# Patient Record
Sex: Female | Born: 1945 | Race: White | Hispanic: No | Marital: Married | Smoking: Never smoker
Health system: Southern US, Community
[De-identification: ages and names within clinical notes are randomized; demographics above are authoritative.]

## PROBLEM LIST (undated history)

## (undated) DIAGNOSIS — G4730 Sleep apnea, unspecified: Secondary | ICD-10-CM

## (undated) DIAGNOSIS — I10 Essential (primary) hypertension: Secondary | ICD-10-CM

## (undated) DIAGNOSIS — K219 Gastro-esophageal reflux disease without esophagitis: Secondary | ICD-10-CM

## (undated) DIAGNOSIS — E119 Type 2 diabetes mellitus without complications: Secondary | ICD-10-CM

## (undated) DIAGNOSIS — F329 Major depressive disorder, single episode, unspecified: Secondary | ICD-10-CM

## (undated) DIAGNOSIS — F411 Generalized anxiety disorder: Secondary | ICD-10-CM

## (undated) DIAGNOSIS — L52 Erythema nodosum: Secondary | ICD-10-CM

## (undated) DIAGNOSIS — M797 Fibromyalgia: Secondary | ICD-10-CM

## (undated) DIAGNOSIS — M8580 Other specified disorders of bone density and structure, unspecified site: Secondary | ICD-10-CM

## (undated) DIAGNOSIS — K5730 Diverticulosis of large intestine without perforation or abscess without bleeding: Secondary | ICD-10-CM

## (undated) DIAGNOSIS — F419 Anxiety disorder, unspecified: Secondary | ICD-10-CM

## (undated) DIAGNOSIS — G709 Myoneural disorder, unspecified: Secondary | ICD-10-CM

## (undated) DIAGNOSIS — K529 Noninfective gastroenteritis and colitis, unspecified: Secondary | ICD-10-CM

## (undated) DIAGNOSIS — F39 Unspecified mood [affective] disorder: Secondary | ICD-10-CM

## (undated) DIAGNOSIS — H269 Unspecified cataract: Secondary | ICD-10-CM

## (undated) DIAGNOSIS — M1990 Unspecified osteoarthritis, unspecified site: Secondary | ICD-10-CM

## (undated) DIAGNOSIS — T7840XA Allergy, unspecified, initial encounter: Secondary | ICD-10-CM

## (undated) DIAGNOSIS — K589 Irritable bowel syndrome without diarrhea: Secondary | ICD-10-CM

## (undated) DIAGNOSIS — Z5189 Encounter for other specified aftercare: Secondary | ICD-10-CM

## (undated) HISTORY — DX: Unspecified mood (affective) disorder: F39

## (undated) HISTORY — PX: OTHER SURGICAL HISTORY: SHX169

## (undated) HISTORY — DX: Diverticulosis of large intestine without perforation or abscess without bleeding: K57.30

## (undated) HISTORY — DX: Unspecified osteoarthritis, unspecified site: M19.90

## (undated) HISTORY — PX: CATARACT EXTRACTION W/ INTRAOCULAR LENS  IMPLANT, BILATERAL: SHX1307

## (undated) HISTORY — DX: Unspecified cataract: H26.9

## (undated) HISTORY — DX: Erythema nodosum: L52

## (undated) HISTORY — PX: DENTAL SURGERY: SHX609

## (undated) HISTORY — DX: Fibromyalgia: M79.7

## (undated) HISTORY — PX: ESOPHAGOGASTRODUODENOSCOPY: SHX1529

## (undated) HISTORY — DX: Irritable bowel syndrome without diarrhea: K58.9

## (undated) HISTORY — DX: Allergy, unspecified, initial encounter: T78.40XA

## (undated) HISTORY — DX: Sleep apnea, unspecified: G47.30

## (undated) HISTORY — DX: Noninfective gastroenteritis and colitis, unspecified: K52.9

## (undated) HISTORY — DX: Other specified disorders of bone density and structure, unspecified site: M85.80

## (undated) HISTORY — DX: Encounter for other specified aftercare: Z51.89

## (undated) HISTORY — DX: Essential (primary) hypertension: I10

## (undated) HISTORY — DX: Gastro-esophageal reflux disease without esophagitis: K21.9

## (undated) HISTORY — PX: UPPER GASTROINTESTINAL ENDOSCOPY: SHX188

## (undated) HISTORY — DX: Type 2 diabetes mellitus without complications: E11.9

## (undated) HISTORY — DX: Anxiety disorder, unspecified: F41.9

## (undated) HISTORY — PX: NISSEN FUNDOPLICATION: SHX2091

## (undated) HISTORY — PX: CHOLECYSTECTOMY: SHX55

## (undated) HISTORY — DX: Major depressive disorder, single episode, unspecified: F32.9

## (undated) HISTORY — DX: Myoneural disorder, unspecified: G70.9

## (undated) HISTORY — PX: ROTATOR CUFF REPAIR: SHX139

## (undated) HISTORY — PX: ABDOMINAL HYSTERECTOMY: SHX81

## (undated) HISTORY — PX: COLONOSCOPY: SHX174

---
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
## 1997-04-16 ENCOUNTER — Inpatient Hospital Stay (HOSPITAL_COMMUNITY): Admission: RE | Admit: 1997-04-16 | Discharge: 1997-04-17 | Payer: Self-pay | Admitting: Surgery

## 1997-11-03 ENCOUNTER — Encounter: Payer: Self-pay | Admitting: Obstetrics and Gynecology

## 1997-11-03 ENCOUNTER — Ambulatory Visit (HOSPITAL_COMMUNITY): Admission: RE | Admit: 1997-11-03 | Discharge: 1997-11-03 | Payer: Self-pay | Admitting: Obstetrics and Gynecology

## 1997-11-06 ENCOUNTER — Other Ambulatory Visit: Admission: RE | Admit: 1997-11-06 | Discharge: 1997-11-06 | Payer: Self-pay | Admitting: Obstetrics and Gynecology

## 1998-11-08 ENCOUNTER — Ambulatory Visit (HOSPITAL_COMMUNITY): Admission: RE | Admit: 1998-11-08 | Discharge: 1998-11-08 | Payer: Self-pay | Admitting: Obstetrics and Gynecology

## 1998-11-08 ENCOUNTER — Encounter: Payer: Self-pay | Admitting: Obstetrics and Gynecology

## 1998-12-08 ENCOUNTER — Encounter: Payer: Self-pay | Admitting: Obstetrics and Gynecology

## 1998-12-08 ENCOUNTER — Encounter: Admission: RE | Admit: 1998-12-08 | Discharge: 1998-12-08 | Payer: Self-pay | Admitting: Obstetrics and Gynecology

## 1999-11-09 ENCOUNTER — Encounter: Payer: Self-pay | Admitting: Obstetrics and Gynecology

## 1999-11-09 ENCOUNTER — Encounter: Admission: RE | Admit: 1999-11-09 | Discharge: 1999-11-09 | Payer: Self-pay | Admitting: Obstetrics and Gynecology

## 2000-11-12 ENCOUNTER — Encounter: Payer: Self-pay | Admitting: Obstetrics and Gynecology

## 2000-11-12 ENCOUNTER — Encounter: Admission: RE | Admit: 2000-11-12 | Discharge: 2000-11-12 | Payer: Self-pay | Admitting: Obstetrics and Gynecology

## 2001-03-25 ENCOUNTER — Other Ambulatory Visit: Admission: RE | Admit: 2001-03-25 | Discharge: 2001-03-25 | Payer: Self-pay | Admitting: Obstetrics and Gynecology

## 2001-03-28 ENCOUNTER — Encounter: Payer: Self-pay | Admitting: Obstetrics and Gynecology

## 2001-03-28 ENCOUNTER — Encounter: Admission: RE | Admit: 2001-03-28 | Discharge: 2001-03-28 | Payer: Self-pay | Admitting: Obstetrics and Gynecology

## 2001-07-09 ENCOUNTER — Encounter (INDEPENDENT_AMBULATORY_CARE_PROVIDER_SITE_OTHER): Payer: Self-pay | Admitting: *Deleted

## 2001-07-09 ENCOUNTER — Ambulatory Visit (HOSPITAL_COMMUNITY): Admission: RE | Admit: 2001-07-09 | Discharge: 2001-07-09 | Payer: Self-pay | Admitting: Internal Medicine

## 2001-07-09 ENCOUNTER — Encounter: Payer: Self-pay | Admitting: Internal Medicine

## 2001-08-12 ENCOUNTER — Ambulatory Visit (HOSPITAL_COMMUNITY): Admission: RE | Admit: 2001-08-12 | Discharge: 2001-08-12 | Payer: Self-pay | Admitting: Internal Medicine

## 2001-08-29 ENCOUNTER — Encounter: Payer: Self-pay | Admitting: Internal Medicine

## 2001-09-04 ENCOUNTER — Encounter: Payer: Self-pay | Admitting: Internal Medicine

## 2001-09-04 ENCOUNTER — Ambulatory Visit (HOSPITAL_COMMUNITY): Admission: RE | Admit: 2001-09-04 | Discharge: 2001-09-04 | Payer: Self-pay | Admitting: Internal Medicine

## 2001-11-13 ENCOUNTER — Encounter: Admission: RE | Admit: 2001-11-13 | Discharge: 2001-11-13 | Payer: Self-pay | Admitting: Obstetrics and Gynecology

## 2001-11-13 ENCOUNTER — Encounter: Payer: Self-pay | Admitting: Obstetrics and Gynecology

## 2001-11-22 ENCOUNTER — Encounter: Payer: Self-pay | Admitting: Surgery

## 2001-11-27 ENCOUNTER — Encounter (INDEPENDENT_AMBULATORY_CARE_PROVIDER_SITE_OTHER): Payer: Self-pay | Admitting: *Deleted

## 2001-11-28 ENCOUNTER — Inpatient Hospital Stay (HOSPITAL_COMMUNITY): Admission: RE | Admit: 2001-11-28 | Discharge: 2001-11-29 | Payer: Self-pay | Admitting: Surgery

## 2002-12-22 ENCOUNTER — Encounter: Payer: Self-pay | Admitting: Internal Medicine

## 2003-01-27 ENCOUNTER — Encounter: Admission: RE | Admit: 2003-01-27 | Discharge: 2003-01-27 | Payer: Self-pay | Admitting: Obstetrics and Gynecology

## 2003-12-25 ENCOUNTER — Ambulatory Visit: Payer: Self-pay | Admitting: Internal Medicine

## 2004-02-18 ENCOUNTER — Encounter: Admission: RE | Admit: 2004-02-18 | Discharge: 2004-02-18 | Payer: Self-pay | Admitting: Obstetrics and Gynecology

## 2004-02-23 ENCOUNTER — Ambulatory Visit: Payer: Self-pay | Admitting: Internal Medicine

## 2004-03-22 ENCOUNTER — Ambulatory Visit: Payer: Self-pay | Admitting: Internal Medicine

## 2004-04-25 ENCOUNTER — Ambulatory Visit: Payer: Self-pay | Admitting: Internal Medicine

## 2004-05-30 ENCOUNTER — Ambulatory Visit: Payer: Self-pay | Admitting: Internal Medicine

## 2004-10-17 ENCOUNTER — Encounter: Admission: RE | Admit: 2004-10-17 | Discharge: 2004-10-17 | Payer: Self-pay | Admitting: Internal Medicine

## 2004-10-17 ENCOUNTER — Ambulatory Visit: Payer: Self-pay | Admitting: Internal Medicine

## 2004-11-19 ENCOUNTER — Ambulatory Visit: Payer: Self-pay | Admitting: Internal Medicine

## 2004-12-02 ENCOUNTER — Ambulatory Visit: Payer: Self-pay | Admitting: Internal Medicine

## 2005-01-26 ENCOUNTER — Ambulatory Visit: Payer: Self-pay | Admitting: Internal Medicine

## 2005-02-03 ENCOUNTER — Ambulatory Visit: Payer: Self-pay | Admitting: Internal Medicine

## 2005-03-10 ENCOUNTER — Ambulatory Visit: Payer: Self-pay | Admitting: Internal Medicine

## 2005-03-26 ENCOUNTER — Encounter: Admission: RE | Admit: 2005-03-26 | Discharge: 2005-03-26 | Payer: Self-pay | Admitting: Neurology

## 2005-04-11 ENCOUNTER — Encounter: Admission: RE | Admit: 2005-04-11 | Discharge: 2005-04-11 | Payer: Self-pay | Admitting: Internal Medicine

## 2005-05-04 ENCOUNTER — Encounter: Payer: Self-pay | Admitting: Internal Medicine

## 2005-08-08 ENCOUNTER — Ambulatory Visit: Payer: Self-pay | Admitting: Internal Medicine

## 2005-09-14 ENCOUNTER — Ambulatory Visit: Payer: Self-pay | Admitting: Internal Medicine

## 2005-10-03 ENCOUNTER — Ambulatory Visit: Payer: Self-pay | Admitting: Internal Medicine

## 2005-10-03 ENCOUNTER — Encounter (INDEPENDENT_AMBULATORY_CARE_PROVIDER_SITE_OTHER): Payer: Self-pay | Admitting: Specialist

## 2005-10-03 ENCOUNTER — Encounter: Payer: Self-pay | Admitting: Internal Medicine

## 2005-10-05 ENCOUNTER — Ambulatory Visit (HOSPITAL_COMMUNITY): Admission: RE | Admit: 2005-10-05 | Discharge: 2005-10-05 | Payer: Self-pay | Admitting: Internal Medicine

## 2005-10-05 ENCOUNTER — Encounter: Payer: Self-pay | Admitting: Internal Medicine

## 2005-10-17 ENCOUNTER — Ambulatory Visit: Payer: Self-pay | Admitting: Internal Medicine

## 2006-03-06 ENCOUNTER — Ambulatory Visit: Payer: Self-pay | Admitting: Internal Medicine

## 2006-03-07 ENCOUNTER — Ambulatory Visit: Payer: Self-pay | Admitting: Internal Medicine

## 2006-03-07 LAB — CONVERTED CEMR LAB
Glucose, Bld: 109 mg/dL — ABNORMAL HIGH (ref 70–99)
Hgb A1c MFr Bld: 6.1 % — ABNORMAL HIGH (ref 4.6–6.0)

## 2006-03-12 ENCOUNTER — Encounter: Payer: Self-pay | Admitting: Internal Medicine

## 2006-03-12 ENCOUNTER — Ambulatory Visit (HOSPITAL_COMMUNITY): Admission: RE | Admit: 2006-03-12 | Discharge: 2006-03-12 | Payer: Self-pay | Admitting: Internal Medicine

## 2006-03-14 ENCOUNTER — Ambulatory Visit: Payer: Self-pay | Admitting: Internal Medicine

## 2006-03-26 ENCOUNTER — Encounter: Admission: RE | Admit: 2006-03-26 | Discharge: 2006-06-24 | Payer: Self-pay | Admitting: Internal Medicine

## 2006-04-20 ENCOUNTER — Encounter: Admission: RE | Admit: 2006-04-20 | Discharge: 2006-04-20 | Payer: Self-pay | Admitting: Internal Medicine

## 2006-05-21 ENCOUNTER — Ambulatory Visit: Payer: Self-pay | Admitting: Internal Medicine

## 2006-05-21 LAB — CONVERTED CEMR LAB
Calcium: 9.6 mg/dL (ref 8.4–10.5)
Chloride: 107 meq/L (ref 96–112)
Creatinine, Ser: 0.8 mg/dL (ref 0.4–1.2)
GFR calc Af Amer: 94 mL/min
Potassium: 4 meq/L (ref 3.5–5.1)
Sodium: 143 meq/L (ref 135–145)
TSH: 2.7 microintl units/mL (ref 0.35–5.50)

## 2006-05-28 ENCOUNTER — Ambulatory Visit: Payer: Self-pay | Admitting: Internal Medicine

## 2006-07-09 ENCOUNTER — Ambulatory Visit (HOSPITAL_BASED_OUTPATIENT_CLINIC_OR_DEPARTMENT_OTHER): Admission: RE | Admit: 2006-07-09 | Discharge: 2006-07-09 | Payer: Self-pay | Admitting: Orthopedic Surgery

## 2006-07-11 ENCOUNTER — Encounter: Payer: Self-pay | Admitting: Internal Medicine

## 2006-07-11 DIAGNOSIS — L259 Unspecified contact dermatitis, unspecified cause: Secondary | ICD-10-CM

## 2006-07-11 DIAGNOSIS — K449 Diaphragmatic hernia without obstruction or gangrene: Secondary | ICD-10-CM

## 2006-07-11 DIAGNOSIS — J309 Allergic rhinitis, unspecified: Secondary | ICD-10-CM

## 2006-07-11 DIAGNOSIS — L309 Dermatitis, unspecified: Secondary | ICD-10-CM

## 2006-07-11 DIAGNOSIS — I10 Essential (primary) hypertension: Secondary | ICD-10-CM

## 2006-07-11 DIAGNOSIS — K219 Gastro-esophageal reflux disease without esophagitis: Secondary | ICD-10-CM

## 2006-07-11 HISTORY — DX: Dermatitis, unspecified: L30.9

## 2006-07-11 HISTORY — DX: Allergic rhinitis, unspecified: J30.9

## 2006-07-11 HISTORY — DX: Diaphragmatic hernia without obstruction or gangrene: K44.9

## 2006-07-11 HISTORY — DX: Essential (primary) hypertension: I10

## 2006-08-14 ENCOUNTER — Telehealth (INDEPENDENT_AMBULATORY_CARE_PROVIDER_SITE_OTHER): Payer: Self-pay | Admitting: *Deleted

## 2006-08-15 ENCOUNTER — Ambulatory Visit: Payer: Self-pay | Admitting: Internal Medicine

## 2006-08-15 DIAGNOSIS — L52 Erythema nodosum: Secondary | ICD-10-CM

## 2006-08-15 DIAGNOSIS — R7309 Other abnormal glucose: Secondary | ICD-10-CM

## 2006-08-15 HISTORY — DX: Other abnormal glucose: R73.09

## 2006-08-30 ENCOUNTER — Ambulatory Visit: Payer: Self-pay | Admitting: Internal Medicine

## 2006-08-30 LAB — CONVERTED CEMR LAB
BUN: 17 mg/dL (ref 6–23)
Basophils Relative: 0.8 % (ref 0.0–1.0)
Creatinine, Ser: 0.8 mg/dL (ref 0.4–1.2)
GFR calc non Af Amer: 78 mL/min
Glucose, Bld: 106 mg/dL — ABNORMAL HIGH (ref 70–99)
Glucose, Urine, Semiquant: NEGATIVE
Hemoglobin: 13.4 g/dL (ref 12.0–15.0)
Ketones, urine, test strip: NEGATIVE
MCV: 83.1 fL (ref 78.0–100.0)
Neutro Abs: 6.2 10*3/uL (ref 1.4–7.7)
Neutrophils Relative %: 68.2 % (ref 43.0–77.0)
Nitrite: NEGATIVE
Potassium: 4.4 meq/L (ref 3.5–5.1)
Protein, U semiquant: NEGATIVE
RBC: 4.58 M/uL (ref 3.87–5.11)
Specific Gravity, Urine: 1.02
WBC: 9.1 10*3/uL (ref 4.5–10.5)

## 2006-09-10 ENCOUNTER — Ambulatory Visit: Payer: Self-pay | Admitting: Internal Medicine

## 2006-09-10 DIAGNOSIS — F329 Major depressive disorder, single episode, unspecified: Secondary | ICD-10-CM

## 2006-09-10 HISTORY — DX: Major depressive disorder, single episode, unspecified: F32.9

## 2006-10-09 ENCOUNTER — Ambulatory Visit: Payer: Self-pay | Admitting: Internal Medicine

## 2006-11-14 ENCOUNTER — Ambulatory Visit: Payer: Self-pay | Admitting: Internal Medicine

## 2006-12-10 ENCOUNTER — Ambulatory Visit: Payer: Self-pay | Admitting: Internal Medicine

## 2006-12-10 DIAGNOSIS — IMO0001 Reserved for inherently not codable concepts without codable children: Secondary | ICD-10-CM

## 2006-12-10 DIAGNOSIS — R143 Flatulence: Secondary | ICD-10-CM

## 2006-12-10 DIAGNOSIS — K5730 Diverticulosis of large intestine without perforation or abscess without bleeding: Secondary | ICD-10-CM | POA: Insufficient documentation

## 2006-12-10 DIAGNOSIS — K5900 Constipation, unspecified: Secondary | ICD-10-CM | POA: Insufficient documentation

## 2006-12-10 DIAGNOSIS — M797 Fibromyalgia: Secondary | ICD-10-CM | POA: Insufficient documentation

## 2006-12-10 DIAGNOSIS — R141 Gas pain: Secondary | ICD-10-CM

## 2006-12-10 DIAGNOSIS — R142 Eructation: Secondary | ICD-10-CM

## 2006-12-10 LAB — CONVERTED CEMR LAB: Blood Glucose, Fingerstick: 96

## 2006-12-19 ENCOUNTER — Encounter: Payer: Self-pay | Admitting: Internal Medicine

## 2007-01-07 ENCOUNTER — Ambulatory Visit: Payer: Self-pay | Admitting: Internal Medicine

## 2007-01-07 LAB — CONVERTED CEMR LAB: Vit D, 1,25-Dihydroxy: 30 (ref 30–89)

## 2007-01-15 ENCOUNTER — Ambulatory Visit: Payer: Self-pay | Admitting: Internal Medicine

## 2007-03-12 ENCOUNTER — Ambulatory Visit: Payer: Self-pay | Admitting: Internal Medicine

## 2007-03-12 DIAGNOSIS — R5381 Other malaise: Secondary | ICD-10-CM

## 2007-03-12 DIAGNOSIS — R5383 Other fatigue: Secondary | ICD-10-CM

## 2007-03-12 HISTORY — DX: Other fatigue: R53.83

## 2007-04-22 ENCOUNTER — Encounter: Admission: RE | Admit: 2007-04-22 | Discharge: 2007-04-22 | Payer: Self-pay | Admitting: Internal Medicine

## 2007-04-23 ENCOUNTER — Ambulatory Visit: Payer: Self-pay | Admitting: Internal Medicine

## 2007-04-23 DIAGNOSIS — G479 Sleep disorder, unspecified: Secondary | ICD-10-CM

## 2007-04-23 DIAGNOSIS — G4733 Obstructive sleep apnea (adult) (pediatric): Secondary | ICD-10-CM

## 2007-04-23 HISTORY — DX: Obstructive sleep apnea (adult) (pediatric): G47.33

## 2007-04-24 ENCOUNTER — Telehealth: Payer: Self-pay | Admitting: Internal Medicine

## 2007-05-08 ENCOUNTER — Encounter: Payer: Self-pay | Admitting: Internal Medicine

## 2007-06-24 ENCOUNTER — Ambulatory Visit: Payer: Self-pay | Admitting: Internal Medicine

## 2007-06-28 ENCOUNTER — Emergency Department (HOSPITAL_COMMUNITY): Admission: EM | Admit: 2007-06-28 | Discharge: 2007-06-28 | Payer: Self-pay | Admitting: Emergency Medicine

## 2007-07-20 LAB — HM DIABETES EYE EXAM: HM Diabetic Eye Exam: NORMAL

## 2007-10-02 ENCOUNTER — Ambulatory Visit: Payer: Self-pay | Admitting: Internal Medicine

## 2007-10-02 LAB — CONVERTED CEMR LAB
AST: 38 units/L — ABNORMAL HIGH (ref 0–37)
Albumin: 4.1 g/dL (ref 3.5–5.2)
BUN: 13 mg/dL (ref 6–23)
Basophils Absolute: 0.1 10*3/uL (ref 0.0–0.1)
Bilirubin, Direct: 0.1 mg/dL (ref 0.0–0.3)
CO2: 29 meq/L (ref 19–32)
Chloride: 113 meq/L — ABNORMAL HIGH (ref 96–112)
Eosinophils Absolute: 0.2 10*3/uL (ref 0.0–0.7)
Eosinophils Relative: 3.2 % (ref 0.0–5.0)
GFR calc Af Amer: 109 mL/min
GFR calc non Af Amer: 90 mL/min
Glucose, Bld: 107 mg/dL — ABNORMAL HIGH (ref 70–99)
HDL: 69.5 mg/dL (ref >39.0)
Hemoglobin: 13.8 g/dL (ref 12.0–15.0)
Hgb A1c MFr Bld: 5.6 % (ref 4.6–6.0)
LDL Cholesterol: 84 mg/dL (ref 0–99)
MCV: 88.3 fL (ref 78.0–100.0)
Microalb, Ur: 1.4 mg/dL (ref 0.0–1.9)
Monocytes Relative: 5.6 % (ref 3.0–12.0)
Neutrophils Relative %: 69.2 % (ref 43.0–77.0)
Platelets: 294 10*3/uL (ref 150–400)
Potassium: 4 meq/L (ref 3.5–5.1)
RDW: 12.8 % (ref 11.5–14.6)
Total Bilirubin: 0.8 mg/dL (ref 0.3–1.2)
Total CHOL/HDL Ratio: 2.6

## 2007-10-09 ENCOUNTER — Ambulatory Visit: Payer: Self-pay | Admitting: Internal Medicine

## 2007-10-09 DIAGNOSIS — M549 Dorsalgia, unspecified: Secondary | ICD-10-CM

## 2007-10-09 HISTORY — DX: Dorsalgia, unspecified: M54.9

## 2007-11-05 ENCOUNTER — Ambulatory Visit: Payer: Self-pay | Admitting: Internal Medicine

## 2007-11-05 LAB — CONVERTED CEMR LAB
Albumin: 4 g/dL (ref 3.5–5.2)
Alkaline Phosphatase: 60 units/L (ref 39–117)
Hep B S Ab: NEGATIVE
Total Bilirubin: 0.6 mg/dL (ref 0.3–1.2)

## 2007-11-11 ENCOUNTER — Ambulatory Visit: Payer: Self-pay | Admitting: Internal Medicine

## 2007-11-11 DIAGNOSIS — R252 Cramp and spasm: Secondary | ICD-10-CM | POA: Insufficient documentation

## 2007-11-11 HISTORY — DX: Cramp and spasm: R25.2

## 2008-03-02 ENCOUNTER — Ambulatory Visit: Payer: Self-pay | Admitting: Internal Medicine

## 2008-03-02 LAB — CONVERTED CEMR LAB
AST: 22 units/L (ref 0–37)
Bilirubin, Direct: 0.1 mg/dL (ref 0.0–0.3)
Total Bilirubin: 0.6 mg/dL (ref 0.3–1.2)

## 2008-03-09 ENCOUNTER — Ambulatory Visit: Payer: Self-pay | Admitting: Internal Medicine

## 2008-03-09 LAB — CONVERTED CEMR LAB: Blood Glucose, Fingerstick: 112

## 2008-04-16 ENCOUNTER — Encounter: Payer: Self-pay | Admitting: Internal Medicine

## 2008-04-22 ENCOUNTER — Telehealth: Payer: Self-pay | Admitting: *Deleted

## 2008-05-12 ENCOUNTER — Telehealth: Payer: Self-pay | Admitting: *Deleted

## 2008-05-18 ENCOUNTER — Encounter: Admission: RE | Admit: 2008-05-18 | Discharge: 2008-05-18 | Payer: Self-pay | Admitting: Internal Medicine

## 2008-08-17 ENCOUNTER — Ambulatory Visit: Payer: Self-pay | Admitting: Internal Medicine

## 2008-08-17 LAB — CONVERTED CEMR LAB
AST: 16 units/L (ref 0–37)
Albumin: 4 g/dL (ref 3.5–5.2)
Bilirubin, Direct: 0.1 mg/dL (ref 0.0–0.3)
Calcium: 9.3 mg/dL (ref 8.4–10.5)
Chloride: 112 meq/L (ref 96–112)
Creatinine, Ser: 0.8 mg/dL (ref 0.4–1.2)
Creatinine,U: 261.1 mg/dL
LDL Cholesterol: 76 mg/dL (ref 0–99)
Microalb Creat Ratio: 4.2 mg/g (ref 0.0–30.0)
Potassium: 3.9 meq/L (ref 3.5–5.1)
Total Bilirubin: 0.7 mg/dL (ref 0.3–1.2)

## 2008-09-01 ENCOUNTER — Ambulatory Visit: Payer: Self-pay | Admitting: Internal Medicine

## 2008-09-01 DIAGNOSIS — M25519 Pain in unspecified shoulder: Secondary | ICD-10-CM

## 2008-09-15 ENCOUNTER — Telehealth: Payer: Self-pay | Admitting: *Deleted

## 2008-10-21 ENCOUNTER — Ambulatory Visit: Payer: Self-pay | Admitting: Internal Medicine

## 2008-11-18 ENCOUNTER — Telehealth: Payer: Self-pay | Admitting: Internal Medicine

## 2008-11-20 ENCOUNTER — Ambulatory Visit: Payer: Self-pay | Admitting: Internal Medicine

## 2008-11-20 LAB — CONVERTED CEMR LAB
Eosinophils Relative: 5.6 % — ABNORMAL HIGH (ref 0.0–5.0)
HCT: 41 % (ref 36.0–46.0)
Hemoglobin: 13.7 g/dL (ref 12.0–15.0)
Hgb A1c MFr Bld: 5.7 % (ref 4.6–6.5)
MCHC: 33.3 g/dL (ref 30.0–36.0)
Neutrophils Relative %: 61.3 % (ref 43.0–77.0)
Vitamin B-12: 313 pg/mL (ref 211–911)
WBC: 7.2 10*3/uL (ref 4.5–10.5)

## 2008-11-27 ENCOUNTER — Ambulatory Visit: Payer: Self-pay | Admitting: Internal Medicine

## 2008-11-27 DIAGNOSIS — R197 Diarrhea, unspecified: Secondary | ICD-10-CM

## 2008-11-27 DIAGNOSIS — H9190 Unspecified hearing loss, unspecified ear: Secondary | ICD-10-CM | POA: Insufficient documentation

## 2008-11-27 HISTORY — DX: Unspecified hearing loss, unspecified ear: H91.90

## 2008-12-09 ENCOUNTER — Encounter (INDEPENDENT_AMBULATORY_CARE_PROVIDER_SITE_OTHER): Payer: Self-pay | Admitting: *Deleted

## 2008-12-23 DIAGNOSIS — K589 Irritable bowel syndrome without diarrhea: Secondary | ICD-10-CM

## 2008-12-23 DIAGNOSIS — K3184 Gastroparesis: Secondary | ICD-10-CM

## 2008-12-23 HISTORY — DX: Irritable bowel syndrome, unspecified: K58.9

## 2008-12-23 HISTORY — DX: Gastroparesis: K31.84

## 2008-12-28 ENCOUNTER — Ambulatory Visit: Payer: Self-pay | Admitting: Internal Medicine

## 2009-05-03 ENCOUNTER — Encounter: Payer: Self-pay | Admitting: Internal Medicine

## 2009-05-19 ENCOUNTER — Encounter: Admission: RE | Admit: 2009-05-19 | Discharge: 2009-05-19 | Payer: Self-pay | Admitting: Internal Medicine

## 2009-05-21 ENCOUNTER — Ambulatory Visit: Payer: Self-pay | Admitting: Internal Medicine

## 2009-05-28 ENCOUNTER — Ambulatory Visit: Payer: Self-pay | Admitting: Internal Medicine

## 2009-06-18 ENCOUNTER — Ambulatory Visit: Payer: Self-pay | Admitting: Internal Medicine

## 2009-10-19 ENCOUNTER — Ambulatory Visit: Payer: Self-pay | Admitting: Internal Medicine

## 2010-01-14 ENCOUNTER — Ambulatory Visit: Payer: Self-pay | Admitting: Internal Medicine

## 2010-01-14 DIAGNOSIS — H8109 Meniere's disease, unspecified ear: Secondary | ICD-10-CM

## 2010-01-18 LAB — CONVERTED CEMR LAB
AST: 17 units/L (ref 0–37)
Alkaline Phosphatase: 56 units/L (ref 39–117)
BUN: 14 mg/dL (ref 6–23)
Basophils Absolute: 0.1 10*3/uL (ref 0.0–0.1)
Basophils Relative: 0.9 % (ref 0.0–3.0)
Calcium: 10.1 mg/dL (ref 8.4–10.5)
Eosinophils Absolute: 0.2 10*3/uL (ref 0.0–0.7)
Eosinophils Relative: 2.3 % (ref 0.0–5.0)
GFR calc non Af Amer: 88.12 mL/min (ref >60.00)
Glucose, Bld: 92 mg/dL (ref 70–99)
HDL: 58.6 mg/dL (ref >39.00)
LDL Cholesterol: 87 mg/dL (ref 0–99)
Lymphocytes Relative: 22.5 % (ref 12.0–46.0)
Lymphs Abs: 2 10*3/uL (ref 0.7–4.0)
MCHC: 33.6 g/dL (ref 30.0–36.0)
Monocytes Relative: 5.6 % (ref 3.0–12.0)
Neutrophils Relative %: 68.7 % (ref 43.0–77.0)
Platelets: 309 10*3/uL (ref 150.0–400.0)
Potassium: 4.3 meq/L (ref 3.5–5.1)
RBC: 4.91 M/uL (ref 3.87–5.11)
RDW: 15.5 % — ABNORMAL HIGH (ref 11.5–14.6)
Total Bilirubin: 0.7 mg/dL (ref 0.3–1.2)
Total CHOL/HDL Ratio: 3
Total Protein: 7 g/dL (ref 6.0–8.3)
Triglycerides: 127 mg/dL (ref 0.0–149.0)

## 2010-02-15 NOTE — Assessment & Plan Note (Signed)
Summary: 6 mo rov/mm   Vital Signs:  Patient profile:   65 year old female Menstrual status:  hysterectomy Weight:      151 pounds Pulse rate:   78 / minute BP sitting:   100 / 70  (left arm) Cuff size:   regular  Vitals Entered By: Sherron Monday, CMA (AAMA) (May 28, 2009 1:40 PM) CC: Follow-up visit on labs, Hypertension Management   History of Present Illness: Caitlin [name] comesin comes in today  for  multiple medical problems .  Gi  Had se of  medication    (n ranitidine)   ?  E nodosum and  and e multiforme  per her research on line   so   after 2-3 weeks she  stopped.   and   went back on kapidex.  Diarrhea had been better and came back on  kapidex.  Lomotil caused constipation  . So not taking much.  Taking b 12     ? no change    in energy. Not sleeping well and  sleep apnea. was never mild enough to have inervention   but has pain in muscles  and makes hard to sleep HT: doing well BG Doing well with ocass 140 range .   Hypertension History:      She complains of headache, dyspnea with exertion, orthopnea, and peripheral edema, but denies chest pain, palpitations, PND, visual symptoms, neurologic problems, syncope, and side effects from treatment.  She notes no problems with any antihypertensive medication side effects.        Positive major cardiovascular risk factors include female age 67 years old or older and hypertension.  Negative major cardiovascular risk factors include non-tobacco-user status.     Preventive Screening-Counseling & Management  Alcohol-Tobacco     Alcohol drinks/day: 0     Smoking Status: never  Caffeine-Diet-Exercise     Caffeine use/day: 3-16 0z     Does Patient Exercise: no     Type of exercise: walking     Times/week: 7  Current Medications (verified): 1)  Freestyle Lancets  Misc (Lancets) .... Use 1 Needle Twice A Day 2)  Freestyle Lite  Strp (Glucose Blood) .... Use 1 Strip Twice A Day 3)  Micardis 40 Mg Tabs (Telmisartan) .[name].. 1  By Mouth Once Daily 4)  Nasacort Aq 55 Mcg/act Aers (Triamcinolone Acetonide(Nasal)) .... Use 2 Sprays in Each Nostril One To Two Times A Day 5)  Kapidex 60 Mg  Cpdr (Dexlansoprazole) .[name].. 1 By Mouth Once Daily 6)  Fexofenadine Hcl 180 Mg Tabs (Fexofenadine Hcl) .[name].. 1 By Mouth Once Daily 7)  Fluocinonide 0.05 % Crea (Fluocinonide) .... Apply Two Times A Day To Rash 8)  Ra Suphedrine Pe Sinus 5-325 Mg Tabs (Phenylephrine-Acetaminophen) .... As Needed 9)  Nasal Decongestant Pe Max St 10 Mg Tabs (Phenylephrine Hcl) .... As Needed 10)  Vitamin D 1000 Unit Tabs (Cholecalciferol) .... One Tablet By Mouth Once Daily 11)  Vitamin C Cr 1000 Mg Cr-Tabs (Ascorbic Acid) .... One Tablet By Mouth Once Daily 12)  Vitamin B-12 500 Mcg  Tabs (Cyanocobalamin)  Allergies (verified): 1)  ! * Zegerid 2)  Codeine Phosphate (Codeine Phosphate) 3)  Sulfamethoxazole (Sulfamethoxazole)  Past History:  Past medical, surgical, family and social histories (including risk factors) reviewed, and no changes noted (except as noted below).  Past Medical History: Reviewed history from 03/09/2008 and no changes required. Allergic rhinitis GERD Hypertension Mood erythema nodosum Diverticulosis, colon    Consults Dr. Tressie Stalker  Past Surgical History: Reviewed history from 11/11/2007 and no changes required. Cholecystectomy EGD-Stricture and Dilatation left shoulder surgery a6/08 Rt Knee surgery  Hysterectomy C-Section Nissen Fundoplication  Past History:  Care Management: None Current GI Ortho  Family History: Reviewed history from 12/28/2008 and no changes required. Family History of Diabetes: Father  No FH of Colon Cancer:  Social History: Reviewed history from 12/28/2008 and no changes required. Never Smoked Alcohol use  Illicit Drug Use - no Occupation: Unemployed  Married  Review of Systems  The patient denies anorexia, fever, weight loss, vision loss, decreased hearing, chest  pain, syncope, dyspnea on exertion, prolonged cough, headaches, hemoptysis, melena, hematochezia, hematuria, difficulty walking, unusual weight change, abnormal bleeding, enlarged lymph nodes, and angioedema.         chronic Diarrhea    and dietary intolerance  Physical Exam  General:  Well-developed,well-nourished,in no acute distress; alert,appropriate and cooperative throughout examination Head:  normocephalic and atraumatic.   Eyes:  vision grossly intact, pupils equal, and pupils round.   Nose:  no external deformity and no external erythema.   Mouth:  pharynx pink and moist.   Neck:  No deformities, masses, or tenderness noted. Lungs:  Normal respiratory effort, chest expands symmetrically. Lungs are clear to auscultation, no crackles or wheezes.no dullness.   Heart:  Normal rate and regular rhythm. S1 and S2 normal without gallop, murmur, click, rub or other extra sounds.no lifts.   Abdomen:  Bowel sounds positive,abdomen soft and non-tender without masses, organomegaly or  noted. Pulses:  pulses intact without delay   Extremities:  no clubbing cyanosis or edema  Neurologic:  non focal grossly  Skin:  turgor normal, color normal, no ecchymoses, and no petechiae.   Cervical Nodes:  No lymphadenopathy noted Psych:  Oriented X3, good eye contact, not anxious appearing, flat affect, and subdued.   reviewed  labs and Dr Olevia Perches    assessment  Impression & Recommendations:  Problem # 1:  FIBROMYALGIA (ICD-729.1) with nocturnal pain  that is problematic .   ..   disc optino of savella   and samples given  and to follow up counseled   Problem # 2:  LOOSE STOOLS (ICD-787.91) ? from  dysmotility and dexilant?     The following medications were removed from the medication list:    Lomotil 2.5-0.025 Mg Tabs (Diphenoxylate-atropine) .[name]... Tqake 1 tablet by mouth at bedtime  Problem # 3:  HYPERTENSION (ICD-401.9)  Her updated medication list for this problem includes:    Micardis 40 Mg  Tabs (Telmisartan) .[name]... 1 by mouth once daily  BP today: 100/70 Prior BP: 122/84 (12/28/2008)  Prior 10 Yr Risk Heart Disease: 3 % (09/01/2008)  Labs Reviewed: K+: 3.9 (08/17/2008) Creat: : 0.8 (08/17/2008)   Chol: 156 (08/17/2008)   HDL: 61.00 (08/17/2008)   LDL: 76 (08/17/2008)   TG: 95.0 (08/17/2008)  Problem # 4:  HYPERGLYCEMIA (ICD-790.29) Assessment: Improved  hg a1c 5.8  Labs Reviewed: Creat: 0.8 (08/17/2008)     Last Eye Exam: normal (07/20/2007)  Problem # 5:  ERYTHEMA NODOSUM (ICD-695.2) resolved  by hx  and she thinks caused by zantac.   Problem # 6:  IRRITABLE BOWEL SYNDROME (ICD-564.1) Assessment: Comment Only  Complete Medication List: 1)  Freestyle Lancets Misc (Lancets) .... Use 1 needle twice a day 2)  Freestyle Lite Strp (Glucose blood) .... Use 1 strip twice a day 3)  Micardis 40 Mg Tabs (Telmisartan) .[name].. 1 by mouth once daily 4)  Nasacort Aq 55 Mcg/act Aers (  Triamcinolone acetonide(nasal)) .... Use 2 sprays in each nostril one to two times a day 5)  Kapidex 60 Mg Cpdr (Dexlansoprazole) .[name].. 1 by mouth once daily 6)  Fexofenadine Hcl 180 Mg Tabs (Fexofenadine hcl) .[name].. 1 by mouth once daily 7)  Fluocinonide 0.05 % Crea (Fluocinonide) .... Apply two times a day to rash 8)  Ra Suphedrine Pe Sinus 5-325 Mg Tabs (Phenylephrine-acetaminophen) .... As needed 9)  Nasal Decongestant Pe Max St 10 Mg Tabs (Phenylephrine hcl) .... As needed 10)  Vitamin D 1000 Unit Tabs (Cholecalciferol) .... One tablet by mouth once daily 11)  Vitamin C Cr 1000 Mg Cr-tabs (Ascorbic acid) .... One tablet by mouth once daily 12)  Vitamin B-12 500 Mcg Tabs (Cyanocobalamin) 13)  Savella Titration Pack 12.5 & 25 & 50 Mg Misc (Milnacipran hcl)  Hypertension Assessment/Plan:      The patient's hypertensive risk group is category B: At least one risk factor (excluding diabetes) with no target organ damage.  Her calculated 10 year risk of coronary heart disease is 3 %.  Today's blood  pressure is 100/70.  Her blood pressure goal is < 140/90.  Patient Instructions: 1)  try savella as directed for fibromyalgia to see if helps energy and sleep rov in  4 weeks or as needed

## 2010-02-15 NOTE — Assessment & Plan Note (Signed)
Summary: 3 wk follow up/cjr   Vital Signs:  Patient profile:   65 year old female Menstrual status:  hysterectomy Weight:      149 pounds Pulse rate:   78 / minute BP sitting:   100 / 70  (right arm) Cuff size:   regular  Vitals Entered By: Romualdo Bolk, CMA (AAMA) (June 18, 2009 1:40 PM) CC: Follow-up visit- Pt states that savella caused ha's, chills and decreased appitite. So she d/c it after 2 weeks, Hypertension Management   History of Present Illness: Caitlin [name] comes in today for above.  see above  . She had a bad experience with savella     and stopped  because of above.   chill feeling   with this.   felt cold.   so stopped  Last dose  May 27  .  today feels ok .    pain in left ear down to chest  and ? if acid related . Subsided.  Gi problems continue the same. Depressive signs are worse than her gi signs and she is skeptical of getting better.  Doesnt want to do counseling Pristiq  caused ? vision change and irritability  .[name]? cymbalta also .  Hypertension History:      She complains of chest pain and peripheral edema, but denies headache, palpitations, dyspnea with exertion, orthopnea, PND, visual symptoms, neurologic problems, syncope, and side effects from treatment.  She notes no problems with any antihypertensive medication side effects.  Pt was having pains that started in her left ear and when down her jaw into her chest and back. This happened on 5/30. [name]        Positive major cardiovascular risk factors include female age 79 years old or older and hypertension.  Negative major cardiovascular risk factors include non-tobacco-user status.     Preventive Screening-Counseling & Management  Alcohol-Tobacco     Alcohol drinks/day: 0     Smoking Status: never  Caffeine-Diet-Exercise     Caffeine use/day: 3-16 0z     Does Patient Exercise: no     Type of exercise: walking     Times/week: 7  Current Medications (verified): 1)  Freestyle Lancets  Misc (Lancets)  .... Use 1 Needle Twice A Day 2)  Freestyle Lite  Strp (Glucose Blood) .... Use 1 Strip Twice A Day 3)  Micardis 40 Mg Tabs (Telmisartan) .[name].. 1 By Mouth Once Daily 4)  Nasacort Aq 55 Mcg/act Aers (Triamcinolone Acetonide(Nasal)) .... Use 2 Sprays in Each Nostril One To Two Times A Day 5)  Kapidex 60 Mg  Cpdr (Dexlansoprazole) .[name].. 1 By Mouth Once Daily 6)  Fexofenadine Hcl 180 Mg Tabs (Fexofenadine Hcl) .[name].. 1 By Mouth Once Daily 7)  Fluocinonide 0.05 % Crea (Fluocinonide) .... Apply Two Times A Day To Rash 8)  Ra Suphedrine Pe Sinus 5-325 Mg Tabs (Phenylephrine-Acetaminophen) .... As Needed 9)  Nasal Decongestant Pe Max St 10 Mg Tabs (Phenylephrine Hcl) .... As Needed 10)  Vitamin D 1000 Unit Tabs (Cholecalciferol) .... One Tablet By Mouth Once Daily 11)  Vitamin C Cr 1000 Mg Cr-Tabs (Ascorbic Acid) .... One Tablet By Mouth Once Daily 12)  Vitamin B-12 500 Mcg  Tabs (Cyanocobalamin)  Allergies (verified): 1)  ! * Zegerid 2)  Codeine Phosphate (Codeine Phosphate) 3)  Sulfamethoxazole (Sulfamethoxazole)  Past History:  Past medical, surgical, family and social histories (including risk factors) reviewed for relevance to current acute and chronic problems.  Past Medical History: Reviewed history from  03/09/2008 and no changes required. Allergic rhinitis GERD Hypertension Mood erythema nodosum Diverticulosis, colon    Consults Dr. Venita Lick       Past Surgical History: Reviewed history from 11/11/2007 and no changes required. Cholecystectomy EGD-Stricture and Dilatation left shoulder surgery a6/08 Rt Knee surgery  Hysterectomy C-Section Nissen Fundoplication  Past History:  Care Management: None [facility]  Family History: Reviewed history from 12/28/2008 and no changes required. Family History of Diabetes: Father  No FH of Colon Cancer:  Mom and dad may have had depression .momwas on elavil with cancer dx   Social History: Reviewed  history from 12/28/2008 and no changes required. Never Smoked Alcohol use  Illicit Drug Use - no Occupation: Unemployed  Married  Physical Exam  General:  alert, well-developed, well-nourished, and well-hydrated.  somewhat depressed affect  Psych:  Oriented X3, normally interactive, good eye contact, not anxious appearing, depressed affect, and subdued.     Impression & Recommendations:  Problem # 1:  DEPRESSION (ICD-311) this seems and she agrees to be major stumbling block to feeling ok  is discouraged that her GI signs continue .  she seems to be having se of many medications or treatment plans. and this makes help problematic .  doesnt think counseing is in order and not interested in this . Is willing to try other medications  but not sure they will help.  I have sugg PSych med consult because of needing help  on what meds to try next to help  her .     she may be willing to do this but not sure if anything will help  . She is clearly depressed and negtive thinking about a lot of things at present.   Since many meds have se  would not think prudent for me to try other meds without psych input.  I ta ppears that most things either cause se of dont seem to work well .   Problem # 2:  ADVERSE REACTION TO MEDICATION (ZOX-096.04) savella  Problem # 3:  GASTROPARESIS (ICD-536.3) Assessment: Comment Only  Problem # 4:  IRRITABLE BOWEL SYNDROME (ICD-564.1)  Problem # 5:  GERD (ICD-530.81)  Her updated medication list for this problem includes:    Kapidex 60 Mg Cpdr (Dexlansoprazole) .[name]... 1 by mouth once daily  Complete Medication List: 1)  Freestyle Lancets Misc (Lancets) .... Use 1 needle twice a day 2)  Freestyle Lite Strp (Glucose blood) .... Use 1 strip twice a day 3)  Micardis 40 Mg Tabs (Telmisartan) .[name].. 1 by mouth once daily 4)  Nasacort Aq 55 Mcg/act Aers (Triamcinolone acetonide(nasal)) .... Use 2 sprays in each nostril one to two times a day 5)  Kapidex 60 Mg Cpdr  (Dexlansoprazole) .[name].. 1 by mouth once daily 6)  Fexofenadine Hcl 180 Mg Tabs (Fexofenadine hcl) .[name].. 1 by mouth once daily 7)  Fluocinonide 0.05 % Crea (Fluocinonide) .... Apply two times a day to rash 8)  Ra Suphedrine Pe Sinus 5-325 Mg Tabs (Phenylephrine-acetaminophen) .... As needed 9)  Nasal Decongestant Pe Max St 10 Mg Tabs (Phenylephrine hcl) .... As needed 10)  Vitamin D 1000 Unit Tabs (Cholecalciferol) .... One tablet by mouth once daily 11)  Vitamin C Cr 1000 Mg Cr-tabs (Ascorbic acid) .... One tablet by mouth once daily 12)  Vitamin B-12 500 Mcg Tabs (Cyanocobalamin)  Hypertension Assessment/Plan:      The patient's hypertensive risk group is category B: At least one risk factor (excluding diabetes) with no  target organ damage.  Her calculated 10 year risk of coronary heart disease is 3 %.  Today's blood pressure is 100/70.  Her blood pressure goal is < 140/90.  Patient Instructions: 1)  Recommend  medication consult for your depression    symptoms .  2)  Dr.   Andee Poles   et al . 3)  Call with appt. time so we can send them information about Medications tried and side effects reported.  4)  I will review your paper record also.    prolonged counseling  greater than 50% of visit spent in counseling  30 minutes

## 2010-02-15 NOTE — Assessment & Plan Note (Signed)
Summary: FLU SHOT // RS   Nurse Visit   Allergies: 1)  ! * Zegerid 2)  Codeine Phosphate (Codeine Phosphate) 3)  Sulfamethoxazole (Sulfamethoxazole)  Orders Added: 1)  Admin 1st Vaccine [90471] 2)  Flu Vaccine 66yrs + [91478] Flu Vaccine Consent Questions     Do you have a history of severe allergic reactions to this vaccine? no    Any prior history of allergic reactions to egg and/or gelatin? no    Do you have a sensitivity to the preservative Thimersol? no    Do you have a past history of Guillan-Barre Syndrome? no    Do you currently have an acute febrile illness? no    Have you ever had a severe reaction to latex? no    Vaccine information given and explained to patient? yes    Are you currently pregnant? no    Lot Number:[id number]   Exp Date:07/16/2010   Site Given  Left Deltoid IM .lbflu

## 2010-02-15 NOTE — Letter (Signed)
Summary: Eye Exam Report/[facility]  Eye Exam Report/[facility]   Imported By: Maryln Gottron 05/11/2009 12:43:03  _____________________________________________________________________  External Attachment:    Type:   Image     Comment:   External Document

## 2010-02-17 NOTE — Assessment & Plan Note (Signed)
Summary: fasting rov/ssc/pt would like to get labs done before her ov/cjr   Vital Signs:  Patient profile:   65 year old female Menstrual status:  hysterectomy Height:      60 inches Weight:      145 pounds BMI:     28.42 Pulse rate:   60 / minute BP sitting:   120 / 80  (left arm) Cuff size:   regular  Vitals Entered By: Romualdo Bolk, CMA (AAMA) (January 14, 2010 10:01 AM) CC: Follow-up visit on meds- Pt is fasting for labs, Hypertension Management   History of Present Illness: Caitlin [name] comes in today  for multiple medical problems . Since her last visist her sister passed away  11/20/24from COPD . stress is less but is sad .  Actually thinks mood is more stable and better than last visit.  HT Seems contrlled in the 120 range  but up today GI: has decreased the ppi as causing ? some se  and taking tums almost daily and "doing ok"  BG: 9 range  doing well  Has seen Dr Brion Aliment for some ear signs and was told whe has menieres disease. NO meds but on lo salt diet with some help. to fu soon.    Hypertension History:      She complains of headache, dyspnea with exertion, orthopnea, peripheral edema, neurologic problems, and syncope, but denies chest pain, palpitations, PND, and visual symptoms.  some dizziness, swelling of ankles, ? on side effects- Left ear problem dx with Meniere's by Dr. Haroldine Laws.        Positive major cardiovascular risk factors include female age 64 years old or older and hypertension.  Negative major cardiovascular risk factors include non-tobacco-user status.     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) 4)  Loose Stools  (ICD-787.91) 5)  Shoulder Pain, Left  (ICD-719.41) 6)  Leg Cramps, Nocturnal  (ICD-729.82) 7)  Back Pain  (ICD-724.5) 8)  Sleep Disorder  (ICD-780.50) 9)  Fatigue  (ICD-780.79) 10)  Intestinal Gas  (ICD-787.3) 11)  Constipation  (ICD-564.0) 12)  Diverticulosis, Colon  (ICD-562.10) 13)  Fibromyalgia  (ICD-729.1) 14)  Depression  (ICD-311) 15)  Hyperglycemia  (ICD-790.29) 16)  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 Use 1 Needle Twice A Day 2)  Freestyle Lite  Strp (Glucose Blood) .... Use 1 Strip Twice A Day 3)  Micardis 40 Mg Tabs (Telmisartan) .[name].. 1 By Mouth Once Daily 4)  Nasacort Aq 55 Mcg/act Aers (Triamcinolone Acetonide(Nasal)) .... Use 2 Sprays in Each Nostril One To Two Times A Day 5)  Kapidex 60 Mg  Cpdr (Dexlansoprazole) .[name].. 1 By Mouth Once Daily 6)  Fluocinonide  0.05 % Crea (Fluocinonide) .... Apply Two Times A Day To Rash 7)  Ra Suphedrine Pe Sinus 5-325 Mg Tabs (Phenylephrine-Acetaminophen) .... As Needed 8)  Nasal Decongestant Pe Max St 10 Mg Tabs (Phenylephrine Hcl) .... As Needed 9)  Vitamin D 1000 Unit Tabs (Cholecalciferol) .... One Tablet By Mouth Once Daily 10)  Vitamin C Cr 1000 Mg Cr-Tabs (Ascorbic Acid) .... One Tablet By Mouth Once Daily 11)  Vitamin B-12 500 Mcg  Tabs (Cyanocobalamin) 12)  Align  Caps (Probiotic Product) 13)  Rogaine 2 % Soln (Minoxidil)  Allergies (verified): 1)  ! * Zegerid 2)  Codeine Phosphate (Codeine Phosphate) 3)  Sulfamethoxazole (Sulfamethoxazole)  Past History:  Past medical, surgical, family and social histories (including risk  factors) reviewed, and no changes noted (except as noted below).  Past Medical History: Allergic rhinitis GERD Hypertension Mood erythema nodosum Diverticulosis, colon   ? menieres Consults Dr. Venita Lick       Past Surgical History: Reviewed history from 11/11/2007 and no changes required. Cholecystectomy EGD-Stricture and Dilatation left shoulder surgery a6/08 Rt Knee surgery  Hysterectomy C-Section Nissen Fundoplication  Past History:  Care Management: None Current GI: Sindy MessingThurston Hole ENT: Haroldine Laws  Family History: Reviewed history from 06/18/2009 and no changes required. Family History of Diabetes: Father  No FH of Colon Cancer:  Mom and dad may have had depression .momwas on elavil with cancer dx  Sister died 11/22/2011COPD  Social History: Reviewed history from 12/28/2008 and no changes required. Never Smoked Alcohol use  Illicit Drug Use - no Occupation: Unemployed  Married [employer] 2 no pets  Dental Care w/in 6 mos.:  yes Fall Risk:  no  Review of Systems  The patient denies fever, weight loss, weight gain, vision loss, decreased hearing, chest pain, prolonged cough, melena, hematochezia, severe indigestion/heartburn, difficulty walking, enlarged lymph nodes, angioedema, and breast masses.         sadness abut less depressed with sisters death in 2022-12-08  hard time losing weight usually on  meal per day   Physical Exam  General:  Well-developed,well-nourished,in no acute distress; alert,appropriate and cooperative throughout examination Head:  normocephalic and atraumatic.   Eyes:  vision grossly intact, pupils equal, and pupils round.   Ears:  R ear normal, L ear normal, and no external deformities.   Nose:  no external deformity, no external erythema, and no nasal discharge.   Mouth:  pharynx pink and moist.   Neck:  No deformities, masses, or tenderness noted. Lungs:  Normal respiratory effort, chest expands symmetrically. Lungs are clear  to auscultation, no crackles or wheezes. Heart:  Normal rate and regular rhythm. S1 and S2 normal without gallop, murmur, click, rub or other extra sounds. Abdomen:  Bowel sounds positive,abdomen soft and non-tender without masses, organomegaly or  noted. Pulses:  pulses intact without delay   Neurologic:  alert & oriented X3.  nl gait and strength  Skin:  turgor normal, color normal, no ecchymoses, and no petechiae.   Cervical Nodes:  No lymphadenopathy noted Psych:  Oriented X3, good eye contact, and not anxious appearing.  subdued      Impression & Recommendations:  Problem # 1:  HYPERTENSION (ICD-401.9) Assessment Unchanged l Her updated medication list for this problem includes:    Micardis 40 Mg Tabs (Telmisartan) .[name]... 1 by mouth once daily  Orders: TLB-TSH (Thyroid Stimulating Hormone) (84443-TSH) TLB-CBC Platelet - w/Differential (85025-CBCD) TLB-BMP (Basic Metabolic Panel-BMET) (80048-METABOL) TLB-Lipid Panel (80061-LIPID) Venipuncture (16109) Specimen Handling (60454)  BP today:  120/80 Prior BP: 100/70 (06/18/2009)  10 Yr Risk Heart Disease: 5 % Prior 10 Yr Risk Heart Disease: 3 % (09/01/2008)  Labs Reviewed: K+: 3.9 (08/17/2008) Creat: : 0.8 (08/17/2008)   Chol: 156 (08/17/2008)   HDL: 61.00 (08/17/2008)   LDL: 76 (08/17/2008)   TG: 95.0 (08/17/2008)  Problem # 2:  HYPERGLYCEMIA (ICD-790.29)  Orders: TLB-A1C / Hgb A1C (Glycohemoglobin) (83036-A1C) Venipuncture (93235) Specimen Handling (99000)  Labs Reviewed: Creat: 0.8 (08/17/2008)     Last Eye Exam: normal (07/20/2007)  Problem # 3:  MENIERE'S DISEASE (ICD-386.00) per   Dr Brion Aliment   Problem # 4:  GERD (ICD-530.81) may want to use non calcium antacid to avoid rebound  sucha sn mg  based one. usiong kapidex as needed to aboud gi se also. Her updated medication list for this problem includes:    Kapidex 60 Mg Cpdr (Dexlansoprazole) .[name]... 1 by mouth once daily  Problem # 5:  DEPRESSION  (ICD-311) Assessment: Improved  Complete Medication List: 1)  Freestyle Lancets Misc (Lancets) .... Use 1 needle twice a day 2)  Freestyle Lite Strp (Glucose blood) .... Use 1 strip twice a day 3)  Micardis 40 Mg Tabs (Telmisartan) .[name].. 1 by mouth once daily 4)  Nasacort Aq 55 Mcg/act Aers (Triamcinolone acetonide(nasal)) .... Use 2 sprays in each nostril one to two times a day 5)  Kapidex 60 Mg Cpdr (Dexlansoprazole) .[name].. 1 by mouth once daily 6)  Fluocinonide 0.05 % Crea (Fluocinonide) .... Apply two times a day to rash 7)  Ra Suphedrine Pe Sinus 5-325 Mg Tabs (Phenylephrine-acetaminophen) .... As needed 8)  Nasal Decongestant Pe Max St 10 Mg Tabs (Phenylephrine hcl) .... As needed 9)  Vitamin D 1000 Unit Tabs (Cholecalciferol) .... One tablet by mouth once daily 10)  Vitamin C Cr 1000 Mg Cr-tabs (Ascorbic acid) .... One tablet by mouth once daily 11)  Vitamin B-12 500 Mcg Tabs (Cyanocobalamin) 12)  Align Caps (Probiotic product) 13)  Rogaine 2 % Soln (Minoxidil)  Other Orders: TLB-Hepatic/Liver Function Pnl (80076-HEPATIC)  Hypertension Assessment/Plan:      The patient's hypertensive risk group is category B: At least one risk factor (excluding diabetes) with no target organ damage.  Her calculated 10 year risk of coronary heart disease is 5 %.  Today's blood pressure is 120/80.  Her blood pressure goal is < 140/90.  Patient Instructions: 1)  try small snacks frequently with protein. 2)  You will be informed of lab results when available.   3)  otherwise wellness check  or OV in 6 months .    Orders Added: 1)  TLB-TSH (Thyroid Stimulating Hormone) [84443-TSH] 2)  TLB-Hepatic/Liver Function Pnl [80076-HEPATIC] 3)  TLB-CBC Platelet - w/Differential [85025-CBCD] 4)  TLB-BMP (Basic Metabolic Panel-BMET) [80048-METABOL] 5)  TLB-Lipid Panel [80061-LIPID] 6)  TLB-A1C / Hgb A1C (Glycohemoglobin) [83036-A1C] 7)  Venipuncture [57322] 8)  Specimen Handling [99000] 9)  Est. Patient  Level IV [02542]

## 2010-03-04 ENCOUNTER — Other Ambulatory Visit: Payer: Self-pay | Admitting: Internal Medicine

## 2010-03-17 ENCOUNTER — Other Ambulatory Visit: Payer: Self-pay | Admitting: Internal Medicine

## 2010-05-03 ENCOUNTER — Other Ambulatory Visit: Payer: Self-pay | Admitting: Internal Medicine

## 2010-05-03 NOTE — Telephone Encounter (Signed)
Pt needs a follow up in June. Letter sent to pt to call back to schedule this appointment.

## 2010-05-05 ENCOUNTER — Telehealth: Payer: Self-pay | Admitting: Internal Medicine

## 2010-05-05 ENCOUNTER — Other Ambulatory Visit: Payer: Self-pay | Admitting: Internal Medicine

## 2010-05-05 DIAGNOSIS — Z1231 Encounter for screening mammogram for malignant neoplasm of breast: Secondary | ICD-10-CM

## 2010-05-05 NOTE — Telephone Encounter (Signed)
Pt needs a fasting wellness visit but will refill her meds until she comes in for this.

## 2010-05-05 NOTE — Telephone Encounter (Signed)
Pt rcvd letter re: get a ov before addtl refills. Pt is req to get lab work done prior to [employer]. Pls advise as to what labs the pt needs.

## 2010-05-09 NOTE — Telephone Encounter (Signed)
Pt returned call and has been sch for fasting med check ov on 05/13/10. Pt did not want to wait for wellness visit.

## 2010-05-09 NOTE — Telephone Encounter (Signed)
I called and lft pt a vm on Fri April 20th, re: sch a fasting wellness ov as noted below. Waiting on call back.

## 2010-05-12 ENCOUNTER — Encounter: Payer: Self-pay | Admitting: Internal Medicine

## 2010-05-13 ENCOUNTER — Encounter: Payer: Self-pay | Admitting: Internal Medicine

## 2010-05-13 ENCOUNTER — Telehealth: Payer: Self-pay | Admitting: Internal Medicine

## 2010-05-13 ENCOUNTER — Ambulatory Visit (INDEPENDENT_AMBULATORY_CARE_PROVIDER_SITE_OTHER): Payer: 59 | Admitting: Internal Medicine

## 2010-05-13 VITALS — BP 120/88 | HR 88 | Temp 98.2°F | Ht 60.5 in | Wt 139.0 lb

## 2010-05-13 DIAGNOSIS — R5383 Other fatigue: Secondary | ICD-10-CM

## 2010-05-13 DIAGNOSIS — E538 Deficiency of other specified B group vitamins: Secondary | ICD-10-CM

## 2010-05-13 DIAGNOSIS — G479 Sleep disorder, unspecified: Secondary | ICD-10-CM

## 2010-05-13 DIAGNOSIS — R5381 Other malaise: Secondary | ICD-10-CM

## 2010-05-13 DIAGNOSIS — R252 Cramp and spasm: Secondary | ICD-10-CM

## 2010-05-13 DIAGNOSIS — H8109 Meniere's disease, unspecified ear: Secondary | ICD-10-CM

## 2010-05-13 DIAGNOSIS — K589 Irritable bowel syndrome without diarrhea: Secondary | ICD-10-CM

## 2010-05-13 DIAGNOSIS — I10 Essential (primary) hypertension: Secondary | ICD-10-CM

## 2010-05-13 DIAGNOSIS — IMO0001 Reserved for inherently not codable concepts without codable children: Secondary | ICD-10-CM

## 2010-05-13 DIAGNOSIS — L52 Erythema nodosum: Secondary | ICD-10-CM

## 2010-05-13 DIAGNOSIS — K219 Gastro-esophageal reflux disease without esophagitis: Secondary | ICD-10-CM

## 2010-05-13 HISTORY — DX: Deficiency of other specified B group vitamins: E53.8

## 2010-05-13 LAB — CBC WITH DIFFERENTIAL/PLATELET
Basophils Relative: 0.8 % (ref 0.0–3.0)
Eosinophils Absolute: 0.2 10*3/uL (ref 0.0–0.7)
Eosinophils Relative: 2.4 % (ref 0.0–5.0)
HCT: 43.7 % (ref 36.0–46.0)
Hemoglobin: 14.8 g/dL (ref 12.0–15.0)
MCHC: 33.8 g/dL (ref 30.0–36.0)
MCV: 88.5 fl (ref 78.0–100.0)
Monocytes Absolute: 0.5 10*3/uL (ref 0.1–1.0)
Neutro Abs: 5.5 10*3/uL (ref 1.4–7.7)
RBC: 4.94 Mil/uL (ref 3.87–5.11)

## 2010-05-13 LAB — BASIC METABOLIC PANEL
BUN: 14 mg/dL (ref 6–23)
CO2: 29 mEq/L (ref 19–32)
Calcium: 9.9 mg/dL (ref 8.4–10.5)
Creatinine, Ser: 0.8 mg/dL (ref 0.4–1.2)
Glucose, Bld: 92 mg/dL (ref 70–99)

## 2010-05-13 LAB — SEDIMENTATION RATE: Sed Rate: 21 mm/hr (ref 0–22)

## 2010-05-13 NOTE — Patient Instructions (Addendum)
Unsure  If    Decongestant  If making your sleep worse.   Sleep deprivation    Is adding to your fatigue  .    Let us know if  You want another opinion about the reflux .   Check your blood pressure readings more often   . See if low all the time.    Will notify you  of labs when available. ROV  In 3-4 weeks or as needed.

## 2010-05-13 NOTE — Telephone Encounter (Signed)
Pt called and wanted to give name of med she is taking. Pt is taking Pot Chloridine 10 mg eq ER cap (generic for Micro K). Pt is taking this every other day with Fluid pill.  She also is taking Chlorthalid 25mg  (hydrogroton) and *Maxifed 60-400mg  (generic for Guaifenesin/pfeudoephedrine).Pt no longer takes Maxifed.  Also taking generic Allegra D (Fexofenodine 180mg  Psuedoephedrine 240mg )  Also pt said lowest bp reading was 97/72 on 05/05/10.

## 2010-05-13 NOTE — Progress Notes (Signed)
Subjective:    Patient ID: Caitlin [name], female    DOB: 1945/01/23, 65 y.o.   MRN: [id number]  HPI  Patient comes if scheduled aas a med check but she really if concerned about fatigue ongoing and tinnitus in ear  . Since her last visit she has been placed on a medication per Dr. Haroldine Laws. 4 possible Mnire's disease Taking qod diuretic   And potassium    When needed.  He is only supposed to go back depending on how she is doing and there are no plans for checking laboratory studies at his office.  She is concerned about ongoing fatigue she has been to the sleep doctor. She states she goes to bed and wakes up 4 times at night either from pain no matter what mattress she has or having to use the bathroom. She is worried she could have some other problem that is causing fatigue. It makes her hard to do things.  She has reflux that wakes her up at night when she lays down. She states that many of the acid blockers cause her to have diarrhea and gas so she says not take those.  Past she has seen Dr. Juanda Chance who she reports as frustrated with her inability to take different medicines for various reasons and she has not followed through at this time. She is taking vitamin B over-the-counter has no falling or balance issues. She denies any joint swelling but hurts at night at different points depending on how she does blame. She denies any fever weight loss vomiting bleeding. Review of Systems Negative for chest pain or wheezing bleeding major changes in vision rest as per history of present illness. She has leg cramps this has not changed. Rest as per above   Past Medical History  Diagnosis Date  . Allergy   . GERD (gastroesophageal reflux disease)     History of stricture and dilatation and Nissen surgery  . Hypertension   . Mood disorder   . Erythema nodosum     Tends to be seasonal has had a negative chest x-ray in the past negative PPD  . Diverticulosis of colon   . Meniere's disease     Past Surgical History  Procedure Date  . Cholecystectomy   . Esophagogastroduodenoscopy     stricture and dilatation  . Left shoulder surgery 06/08  . Rt knee surgery   . Abdominal hysterectomy   . Cesarean section   . Nissen fundoplication     reports that she has never smoked. She does not have any smokeless tobacco history on file. She reports that she does not drink alcohol or use illicit drugs. family history includes COPD in her sister; Cancer in her mother; Depression in her father and mother; and Diabetes in her father. Allergies  Allergen Reactions  . Codeine Phosphate     REACTION: unspecified  . Omeprazole-Sodium Bicarbonate   . Sulfamethoxazole     REACTION: unspecified       Objective:   Physical Exam Well-developed well-nourished healthy-appearing but somewhat depressed appearing white female in no acute distress. She appears frustrated at her situation. HEENT normocephalic TMs clear EOMs full no acute changes. Neck without masses or thyromegaly noted chest CTAP is equal cardiac S1-S2 no gallops or murmurs are heard  Abdomen soft without organomegaly no guarding or rebound Extremities negative CCE no obvious cramps or neurologic deficits. Skin no bruising or bleeding and no  EN seen.     Assessment & Plan:  Fatigue  ongoing questionable worse patient is concerned that her other disease processes besides her current ones and sleep deprivation. She apparently has a history of fibromyalgia which could be aggravating her sleep with pain but we will check for a couple of other conditions including getting a B12 level as it was low normal in the past. And to include an MMA.  ANA also.  She has significant reflux despite surgery which is worse when she lays down which also affects her sleep however every medication that has been offered recently she reports as causing a side effect so she changes her diet to have bland foods to get through this. I would hesitate to  use a TCA which might help her pain and sleep because it might aggravate her reflux. And she is very sensitive to medications.  She may have used Reglan in the remote past but I'm not aware that it may have caused a side effect. I have offered her to see another gastroenterologist at a tertiary care center but she doesn't think it is worth proceeding outweighed at this time.  She is under treatment for Mnire's disease but I don't how the records and we need to update her med list when she gets the name of the medication. We will recheck her chemistry panel.  There is always the possibility of a rheumatologic disease other than fibromyalgia adding to her difficulties. On exam today she appears that she could be mildly depressed but we did not address this.     Prolonged visit today and review of record.Total visit > 50% spent counseling and coordinating care

## 2010-05-16 NOTE — Telephone Encounter (Signed)
Changes made to medication list.

## 2010-05-18 ENCOUNTER — Encounter: Payer: Self-pay | Admitting: Internal Medicine

## 2010-05-23 ENCOUNTER — Ambulatory Visit
Admission: RE | Admit: 2010-05-23 | Discharge: 2010-05-23 | Disposition: A | Discharge status: Home / Self Care | Payer: 59 | Source: Ambulatory Visit | Attending: Internal Medicine | Admitting: Internal Medicine

## 2010-05-23 DIAGNOSIS — Z1231 Encounter for screening mammogram for malignant neoplasm of breast: Secondary | ICD-10-CM

## 2010-05-31 NOTE — Op Note (Signed)
NAMEMAGNOLIA, MATTILA                 ACCOUNT NO.:  [id number]   MEDICAL RECORD NO.:  [id number]          PATIENT TYPE:  AMB   LOCATION:  [facility]SC                          FACILITY:  [facility]   PHYSICIAN:  Robert A. Thurston Hole, M.D. DATE OF BIRTH:  04-22-1945   DATE OF PROCEDURE:  07/09/2006  DATE OF DISCHARGE:                               OPERATIVE REPORT   PREOPERATIVE DIAGNOSIS:  1. Left shoulder partial rotator cuff tear, partial labrum tear.  2. Left shoulder impingement.  3. Left shoulder adhesive capsulitis.  4. 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   POSTOPERATIVE DIAGNOSIS:  1. Left shoulder partial rotator cuff tear, partial labrum tear.  2. Left shoulder impingement.  3. Left shoulder adhesive capsulitis.  4. 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   PROCEDURE PERFORMED:  1. Left shoulder examination under anesthesia, followed by      manipulation.  2. Left shoulder arthroscopic lysis of adhesions.  3. Left shoulder debridement, partial rotator cuff tear and partial      labrum tear.  4. Left shoulder subacromial decompression.  5. Left shoulder distal clavicle excision.   SURGEON:  Elana Alm. Thurston Hole, M.D.   ASSISTANT SURGEON:  Julien Girt, P.A.   ANESTHESIA:  General.   OPERATIVE TIME:  45 minutes.   COMPLICATIONS:  None.   INDICATIONS FOR PROCEDURE:  Caitlin [name] is a 65 year old woman who has had  6-8 months of increasing left shoulder pain, with exam and MRI scan  documenting rotator cuff tendinitis, impingement, with AC joint  arthropathy.  She has failed conservative care and is now to undergo  arthroscopy.   DESCRIPTION OF PROCEDURE:  Ms. Travaglini was brought to the operating room  on July 09, 2006 after an interscalene block was placed in the holding  room by anesthesia.  She was placed on the operative table in the supine  position.  Her left shoulder was examined under  anesthesia.  She had  initial range of motion, forward flexion of 160, abduction 150, internal  and external rotation of 60 degrees.  Gentle manipulation was carried  out, breaking up soft adhesions and improving range of motion to a full  range of motion.  The shoulder remained stable until ligamentous exam.   She was then placed in the beach-chair position and her shoulder and arm  were prepped using sterile DuraPrep and draped using sterile technique.  Originally through a posterior arthroscopic portal, the arthroscope with  a pump attached was placed into an anterior portal and an arthroscopic  probe was placed.  On initial inspection, the articular cartilage in the  glenohumeral joint was intact.  She had partial tearing of the anterior  and posterior labrum of 25%, which was debrided.  The superior labrum  and biceps tendon anchor was intact.  The biceps tendon was intact.  The  inferior labrum and anterior inferior glenohumeral ligament complex was  intact.  The rotator cuff showed a partial tear of the supraspinatus 20%  which was debrided.  The  rest of the rotator cuff was intact.  The  inferior capsular recess was free of pathology.  The subacromial space  showed mildly thickened bursitis which was resected.  The rotator cuff  was inflamed and thickened on the bursal surface, but no evidence of a  tear.  Impingement was noted.   Subacromial decompression was carried out, removing 6-8 mm of the  undersurface of the anterior, anterolateral and anteromedial acromion,  and CA ligament release carried out as well.  The [facility] joint showed  significant spurring and the distal 5-6 mm of clavicle was resected with  a 6 mm bur.  After this was done, the shoulder could be brought through  a full range of motion with no impingement on the rotator cuff.  At this  point, it was felt that all pathology had been satisfactorily addressed.   The instruments were removed.  The portals were closed  with 3-0 nylon  suture.  The joint was injected with 80 mg of Depo-Medrol and 10 mL of  0.25% Marcaine with epinephrine due to her adhesive capsulitis.  Sterile  dressings and a sling were applied.  Then the patient was awakened and  taken to the recovery room in stable condition.   FOLLOW-UP CARE:  Ms. Michalski will be followed as an outpatient on Talwin  NX and her Robaxin with early physical therapy.  She will see me back in  the office in a week for sutures out and follow-up.      Robert A. Thurston Hole, M.D.  Electronically Signed     RAW/MEDQ  D:  07/09/2006  T:  07/09/2006  Job:  [id number]

## 2010-06-03 NOTE — Assessment & Plan Note (Signed)
[facility]                         GASTROENTEROLOGY OFFICE NOTE   NAME:Caitlin [name]                        MRN:          [id number]  DATE:03/06/2006                            DOB:          04/11/1945    Caitlin [name] is a 65 year old white female with severe gastroesophageal  reflux disease, status post failed Nissen fundoplication in 2003.  She  saw Dr. Daphine Deutscher for consideration of repeat fundoplication but Dr. Daphine Deutscher  was not really in favor of redoing the procedure.  There is a  possibility that there is another procedure that could be done elsewhere  with a different approach to the esophagus.  She also has ongoing  irritable bowel syndrome with diarrhea which could be also caused by  post-cholecystectomy syndrome.  The patient has a history of depression.   MEDICATIONS:  1. Nasacort nasal spray.  2. Micardis 40 mg daily.  3. Fexofenadine 180 mg p.o. daily.  4. Evista 60 mg p.o. daily.  5. Aciphex 20 mg p.o. daily.   Last colonoscopy was done in September 2007 and showed moderately severe  diverticulosis of the left colon.  She was put on Crestor and  Dicyclomine with only doubtful results.   PHYSICAL EXAMINATION:  VITAL SIGNS:  Blood pressure 110/68.  Pulse 66.  Weight 160 pounds.  She has gained 10 pounds since last visit.  GENERAL:  She is alert, oriented.  LUNGS:  Clear to auscultation.  CARDIAC:  Normal S1, normal S2.  ABDOMEN:  Soft but mildly obese.  Minimal tenderness in the left lower  quadrant.  Upper abdomen was unremarkable.  Normoactive bowel sounds.  RECTAL EXAM:  Not done.  EXTREMITIES:  No edema.   IMPRESSION:  This is a 65 year old white female with:  1. Severe gastroesophageal reflux disease and failed Nissen      fundoplication in 2003.  Ongoing reflux.  THE PATIENT IS INTOLERANT      TO MULTIPLE PPIS, SPECIFICALLY PROTONIX, NEXIUM, PRILOSEC, AND      ZEGERID.  2. Irritable bowel syndrome/gastrocolic reflux.  3. Status  post old cholecystectomy with resulting occasional diarrhea.  4. Rule out gastroparesis causing bloating and delayed gastric      emptying.   PLAN:  1. Trial of Prevacid Solutabs  30 mg a day.  2. Samples of Flora-Q and Probiotic to take 1 daily.  3. Schedule gastric emptying scan to assess gastric emptying which      would help [place] to decided whether to send the patient for surgical      opinion or to [facility] or [facility] to consider      alternative antireflux procedure.  4. Hemoglobin A1c and glucose.  The patient says that she has a family      of diabetes.     Hedwig Morton. Juanda Chance, MD  Electronically Signed    DMB/MedQ  DD: 03/06/2006  DT: 03/06/2006  Job #: [id number]   cc:   Neta Mends. Fabian Sharp, MD

## 2010-06-03 NOTE — Op Note (Signed)
   NAME:  Caitlin [name], Pree NO.:  [id number]   MEDICAL RECORD NO.:  [id number]                    PATIENT TYPE:   LOCATION:                                       FACILITY:   PHYSICIAN:  Hedwig Morton. Juanda Chance, M.D. [facility]            DATE OF BIRTH:   DATE OF PROCEDURE:  DATE OF DISCHARGE:                                 OPERATIVE REPORT   PROCEDURE:  Esophageal manometry.   INDICATIONS FOR PROCEDURE:  This 65 year old white female has complained of  heart burn, regurgitation of the food and dysphagia as well as cough.  She  is undergoing esophageal manometry to assess her for the possibility of  esophageal motility disorder and to assess her lower esophageal sphincter.   The patient discontinued her Protonix and metoclopramide 40 hours prior to  the scheduled procedure.   RESULTS:  Manometer-2 catheter was placed through naris and placed into the  stomach and pulled back into the esophagus with the distal port being 5 cm  above the lower esophageal sphincter.  The lower esophageal  sphincter  pressure was normal at 24.3 mmHg, normal LES pressure 10 to 45 mmHg.  Residual pressure was 6.6 mm.  The percent relaxation was 71%.  It is within  normal limits.  Peristolic weights measured in distal, mid and proximal  esophagus.  There were 10 wet swallows measured and amplitude and  duration,  contractions was decreased in the proximal esophagus.  Maximum amplitude in  proximal esophagus was 16 mmHg, normal being 30 to 180 mm.  The peristaltic  breaks were abnormal in that there was one a nontransmitted contraction, one  simultaneous and one retrograde contraction out of 10 swallows indicating  there was 70% normal peristalsis and 30% abnormal peristalsis.  Upper  esophageal sphincter was normal and relaxed to the baseline with each  swallow.   IMPRESSION:  This is an abnormal esophageal motility study showing decreased  amplitude as well as peristalsis. The pattern does  not fit any specific  diagnostic category.                                                Hedwig Morton. Juanda Chance, M.D. [facility]    DMB/MEDQ  D:  08/29/2001  T:  09/02/2001  Job:  [id number]

## 2010-06-03 NOTE — Op Note (Signed)
NAMESARANN, TREGRE                           ACCOUNT NO.:  [id number]   MEDICAL RECORD NO.:  [id number]                   PATIENT TYPE:  AMB   LOCATION:  DAY                                  FACILITY:  [facility]   PHYSICIAN:  Thornton Park. Daphine Deutscher, M.D.             DATE OF BIRTH:  January 11, 1946   DATE OF PROCEDURE:  11/27/2001  DATE OF DISCHARGE:                                 OPERATIVE REPORT   PREOPERATIVE DIAGNOSIS:  A 65 year old with gastroesophageal reflux disease.   POSTOPERATIVE DIAGNOSIS:  Significant hiatal hernia with gastroesophageal  reflux disease.   PROCEDURE:  Laparoscopic Nissen fundoplication (3) with closure of the  hiatus with two sutures placed posteriorly calibrated with a 50 light  bougie.   SURGEON:  Thornton Park. Daphine Deutscher, M.D.   ASSISTANT:  Currie Paris, M.D.   ANESTHESIA:  General endotracheal.   DESCRIPTION OF PROCEDURE:  The patient was seen in the holding area  preoperatively and preoperative physical assessment was completed and any  questions were answered and we reiterated some of our discussion from  September 17, 2001.  She was taken to room one and given general anesthesia.  The abdomen was prepped with Betadine and draped sterilely.  I made a  longitudinal incision above her umbilicus and through a purse-string suture,  I inserted an Hasson cannula and insufflated her abdomen.  A 5 mm was placed  in the upper midline for the liver retractor, and then three 1011 were  placed, one in the left upper quadrant and two in the right upper quadrant.  The patient is 5 feet tall and there was a large left lateral segment, which  did compete for room up in the upper abdomen, but we were able to retract  this and visualize a prominent hiatal hernia of the sliding variety.  The  stomach was grasped, brought down, and I incised the pars flaccida and  approached to open the window and then identified the right crus and  dissected along the edge of the crus and  peritoneal reflection and then up  over the anterior portion of the esophagus.  I began the dissection of the  retroesophageal window.  Next, went over on the left side and began working my way up to completely  free up the upper portion of the greater curvature, where it was stuck to  the spleen.  There was a fairly prominent vessel that I double clipped, but  used the Harmonic scalpel on all the short gastrics.  I was able to get good  mobilization of the upper portion of the stomach along the greater curve up  to the left crus and dissected it completely both from the left side as well  as through my retroesophageal window.  Once this was established, a Penrose  drain was placed about the esophagus and I mobilized more esophagus to  easily enable this to come in  the abdomen with some length.  Two sutures were placed through the crura posteriorly and this nicely  approximated the crura.  With a 50 lighted bougie in place, this was snug  and did not appear to be any reason for any further suturing of the crura.  I then pulled that back, reached around, and grabbed the stomach and brought  it through the window, pulled it over and it stayed without any real  tension.  It was well-mobilized.  I then chose a spot  out of contiguous  stomach for creation of the wrap.  This was sutured taking purchases of the  esophagus with each stitch and three stitches were placed to create the wrap  along the esophagus and the GE junction.  These were tied down with  intracorporeal ties with a clip up on the superior-most one.  No evidence of  bleeding.  Everything looked healthy and the wrap appeared nice and viable.  The area was inspected along with the area of the liver, and the area of the  trocar site placements and these all were not bleeding and everything  appeared as we want it to be.  The liver retractor was removed and the liver  returned to its anatomic position.  The umbilical port was  inspected as I  tied down with the purse-string suture and a good  closure occurred.  The other ports were all placed obliquely and were used  to decompress the abdomen and were withdrawn.  The skin was closed with 4-0  Vicryl subcutaneously with benzoin and Steri-Strips.  The patient seemed to  tolerate the procedure well and she was taken to the recovery room in  satisfactory condition.                                                Thornton Park Daphine Deutscher, M.D.    MBM/MEDQ  D:  11/27/2001  T:  11/27/2001  Job:  [id number]   cc:   Lina Sar, M.D. [facility] K. Fabian Sharp, M.D. [facility]

## 2010-06-03 NOTE — Assessment & Plan Note (Signed)
[facility]                           GASTROENTEROLOGY OFFICE NOTE   NAME:Salser, SERENNA DEROY                        MRN:          [id number]  DATE:09/14/2005                            DOB:          11-29-1945    Ms. Easterday is a 65 year old white female with a history of irritable bowel  syndrome and severe gastroesophageal reflux disease, which was evaluated  fully in 2003 and she underwent Nissen fundoplication by Dr. Daphine Deutscher in  November 2003.  She did well for several years, until recently, when her  reflux has recurred.  She was just diagnosed with fibromyalgia by Dr.  Jimmy Footman.  She also has a history of cholelithiasis, status post  cholecystectomy in 1999.  On her last colonoscopy in May 2005, the patient  was found to have a very redundant colon, no polyps, no diverticula.  She is  now complaining of change in the bowel habits, having 3-4 loose bowel  movements a day, some urgency and crampy abdominal pain.  She has a lot of  food intolerance and as a result of it, had to reduce her dietary  preferences to just a few meal items that she can tolerate.  She denies any  rectal bleeding.  Her weight has been about 9 pounds over the weight at the  time of Nissen fundoplication, but in fact, the patient just says that if  she has lost weight recently.   PHYSICAL EXAMINATION:  VITAL SIGNS:  Blood pressure  122/80, pulse of 72 and  weight 149 pounds.  GENERAL:  The patient was somewhat distressed, nervous and concerned.  HEENT:  Sclerae were anicteric.  Oral cavity was normal.  NECK:  Supple with no lymphadenopathy.  LUNGS:  Clear to auscultation.  COR:  Normal S1 and normal S2.  ABDOMEN:  Soft, tender in the epigastrium, normoactive bowel sounds.  No  distention.  The left lower quadrant was normal.  Post-laparoscopic-  cholecystectomy scar was well-healed.  RECTAL:  Exam showed normal rectal tone.  Stool was soft and Hemoccult-  negative.   IMPRESSION:  1. [age]-year-old white female with change in the bowel habits with      diarrhea, which could be due to irritable bowel syndrome, but also      could represent choleretic diarrhea due to post-cholecystectomy state.      We need to rule out new-onset diverticulosis, collagenous colitis or      microscopic colitis.  2. Recurrent exacerbation of the gastroesophageal reflux disease, status      post remote Nissen fundoplication, rule out undone fundoplication      versus decrease of gastric emptying causing accelerated reflux.   PLAN:  1. Aciphex 20 mg p.o. b.i.d., samples given.  The patient has not been      able to tolerate Zegerid; it gives her cramp.  2. Renew Bentyl 10 mg twice a day; she was on antispasmodics in the past      for IBS.  3. Upper endoscopy and colonoscopy both scheduled for further evaluation      of any structural abnormalities and also for  biopsies to rule out      microscopic colitis.                                   Hedwig Morton. Juanda Chance, MD   DMB/MedQ  DD:  09/14/2005  DT:  09/15/2005  Job #:  [id number]   cc:   Neta Mends. Fabian Sharp, MD

## 2010-06-03 NOTE — Procedures (Signed)
[facility]  Patient:    Caitlin [name], Caitlin [name] Visit Number: [id number] MRN: [id number]          Service Type: END Location: ENDO Attending Physician:  Mervin Hack Dictated by:   Hedwig Morton. Juanda Chance, M.D. [facility] Admit Date:  07/09/2001   CC:         Neta Mends. Panosh, M.D. [facility]   Procedure Report  PROCEDURE:  Upper endoscopy and esophageal dilatation.  INDICATIONS:  This 65 year old white female has a history of long-standing gastroesophageal reflux.  She had upper endoscopy and esophageal dilatation in 1999.  She since then has had exacerbation of her reflux, causing her to have regurgitation of the food and constant burning despite taking Nexium 40 mg a day.  She has been increased to Protonix 40 mg twice a day.  This some breaks through symptoms.  She is undergoing upper endoscopy to rule out Barretts esophagus.  INSTRUMENT:  Olympus single-channel videoscope.  PREMEDICATION:  Versed 5 mg IV, Demerol 50 mg IV.  DESCRIPTION OF PROCEDURE:  The Olympus single-channel videoscope was passed routinely through the posterior pharynx into the esophagus.  The patient was monitored by pulse oximetry.  Oxygen saturations were normal.  Proximal and mid esophageal mucosa were unremarkable.  There was a nonobstructing fibrous ring at the distal esophagus at the gastroesophageal junction.  This was consistent with mild esophageal stricture.  There were two short ______ of gastric mucosa extending into esophagus.  These were biopsied to rule out Barretts esophagus.  There was no bleeding from the stricture.  Distal to the stricture was nonreducible hiatal hernia measuring about 3-4 cm.  Stomach:  The stomach was insufflated with air and showed normal gastric folds and gastric antrum as well as the pylorus.  Retroflexion of the endoscope confirmed presence of hiatal hernia.  Duodenum:  The duodenal bulb and descending duodenum were normal.  Endoscope was then  brought back into the stomach.  A guide wire was placed through the endoscope.  The endoscope was retracted and Savary dilators passed over the guide wire without fluoroscopic guidance using 15 mm and 17 mm dilators which passed over the guide wire without difficulty.  There was no blood on the dilator.  The patient tolerated the procedure well.  IMPRESSION: 1. 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 2. Status post dilatation of the stricture to 17 mm. 3. A 3-4 cm nonreducible hiatal hernia.  PLAN: 1. Antireflux measures. 2. Continue Protonix 40 mg p.o. b.i.d. 3. Add Reglan 10 mg p.o. q.h.s. 4. Consider patient to be candidate for Nissen fundoplication.  This will be    discussed with the patient in the office. Dictated by:   Hedwig Morton. Juanda Chance, M.D. [facility] Attending Physician:  Mervin Hack DD:  07/09/01 TD:  07/09/01 Job: [id number]

## 2010-06-07 ENCOUNTER — Ambulatory Visit (INDEPENDENT_AMBULATORY_CARE_PROVIDER_SITE_OTHER): Payer: 59 | Admitting: Internal Medicine

## 2010-06-07 ENCOUNTER — Encounter: Payer: Self-pay | Admitting: Internal Medicine

## 2010-06-07 DIAGNOSIS — I10 Essential (primary) hypertension: Secondary | ICD-10-CM

## 2010-06-07 DIAGNOSIS — J309 Allergic rhinitis, unspecified: Secondary | ICD-10-CM

## 2010-06-07 DIAGNOSIS — R5383 Other fatigue: Secondary | ICD-10-CM

## 2010-06-07 DIAGNOSIS — I959 Hypotension, unspecified: Secondary | ICD-10-CM

## 2010-06-07 DIAGNOSIS — H8109 Meniere's disease, unspecified ear: Secondary | ICD-10-CM

## 2010-06-07 DIAGNOSIS — R5381 Other malaise: Secondary | ICD-10-CM

## 2010-06-07 NOTE — Patient Instructions (Signed)
Stop the micardis   Because you blood pressure is still low. And monitor readings a few times per week.  Call about readings  In the short run stay on fluid pill every other day and  Then  Try every day.   To see if helps the menieres   There are other diuretic that  help this .   Minimize decongestants as needed.

## 2010-06-07 NOTE — Progress Notes (Signed)
  Subjective:    Patient ID: Caitlin [name], female    DOB: 02/26/1945, 65 y.o.   MRN: [id number]  HPI Patient comes in today for followup of multiple medical problems including a low blood pressure fatigue Mnire's disease. Since her last visit her blood pressure readings were reviewed and they are mostly in the high 90 range. She did not have a tachycardia with this. In regard to her Mnire's disease she feels that the diuretic isn't really helping that much but is taking it only every other day with potassium to avoid low blood pressure. Interestingly she  Notes ears better  when she travels to [facility] almost weekly. She does visit her grandson then but wonders if it's related to the change of seeing double pressure. She also notices more problems when the weather changes. Had been using decong as needed. Per Dr. Haroldine Laws. She is apparently to see him on an as-needed basis Bp readings:   Low at home low no syncope new chest pain shortness of breath.  Review of Systems Negative fever headaches vision change. Rest as per history of present illness reflux no change  Past history family history social history reviewed in the electronic medical record.     Objective:   Physical Exam Bp 98.70  96/78 left  wdwn in nad somewhat depressed appearing. Neck no masses  CV rr no g or murmur.    Labs reviewed normal except for slightly low iron saturation.   Assessment & Plan:  HT    Low bp  readings continue. This may be because of the diuretic in addition to the mycardis.   We will Stop the micardis and  Continue diuretic and see if helps ear issues   May need to change diuretic . 2 one more commonly used in Mnire's and may have to consult Dr. Haroldine Laws again. GERD  As above  menieres  As above  Fatigue continues Hard to tease out the above but we'll do one step at a time. Have her call with her blood pressure readings after we stopped the Micardis and see if she is able to tolerate daily  diuretic. We'll then followup in 2 months. If her ear issues or not improved consider changing medication.  Would minimize her decongestants of present to avoid side effects and elevated blood pressure.  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

## 2010-06-20 ENCOUNTER — Ambulatory Visit: Payer: 59 | Admitting: Internal Medicine

## 2010-06-22 ENCOUNTER — Telehealth: Payer: Self-pay | Admitting: Internal Medicine

## 2010-06-22 MED ORDER — TRIAMTERENE-HCTZ 37.5-25 MG PO TABS: ORAL_TABLET | ORAL | Status: DC

## 2010-06-22 MED ORDER — POTASSIUM CHLORIDE 10 MEQ PO TBCR: 10.0000 meq | EXTENDED_RELEASE_TABLET | Freq: Every day | ORAL | Status: DC

## 2010-06-22 NOTE — Telephone Encounter (Signed)
Spoke to pt- her bp readings are 115/70.110/71. Pt has been trying to take the fluid pill. Pt states that md was going to talk to Dr. Haroldine Laws. She has 15 tabs left. She is wanting to know what Dr. Haroldine Laws states. She is still having muscle cramps even with the potassium.

## 2010-06-22 NOTE — Telephone Encounter (Signed)
Pt aware and rx sent to pharmacy. 

## 2010-06-22 NOTE — Telephone Encounter (Signed)
Triage vm--------wants to speak with Carollee Herter or Dr Fabian Sharp regarding her bp and her refills with Dr Haroldine Laws.

## 2010-06-22 NOTE — Telephone Encounter (Signed)
i have not talked with him  But we could change the diuretic   I f she is off the micardis then stop the chlorthalidone all together and after about a week then begin  Dyazide 37.5/ 25 take 1/2 po qd  And then rov in a month after beginning this.

## 2010-08-08 ENCOUNTER — Ambulatory Visit (INDEPENDENT_AMBULATORY_CARE_PROVIDER_SITE_OTHER): Payer: 59 | Admitting: Internal Medicine

## 2010-08-08 ENCOUNTER — Encounter: Payer: Self-pay | Admitting: Internal Medicine

## 2010-08-08 VITALS — BP 120/80 | HR 60 | Wt 140.0 lb

## 2010-08-08 DIAGNOSIS — K219 Gastro-esophageal reflux disease without esophagitis: Secondary | ICD-10-CM

## 2010-08-08 DIAGNOSIS — F329 Major depressive disorder, single episode, unspecified: Secondary | ICD-10-CM

## 2010-08-08 DIAGNOSIS — H9190 Unspecified hearing loss, unspecified ear: Secondary | ICD-10-CM

## 2010-08-08 DIAGNOSIS — H8109 Meniere's disease, unspecified ear: Secondary | ICD-10-CM

## 2010-08-08 DIAGNOSIS — I10 Essential (primary) hypertension: Secondary | ICD-10-CM

## 2010-08-08 DIAGNOSIS — Z23 Encounter for immunization: Secondary | ICD-10-CM

## 2010-08-08 DIAGNOSIS — Z2911 Encounter for prophylactic immunotherapy for respiratory syncytial virus (RSV): Secondary | ICD-10-CM

## 2010-08-08 DIAGNOSIS — I959 Hypotension, unspecified: Secondary | ICD-10-CM

## 2010-08-08 DIAGNOSIS — IMO0001 Reserved for inherently not codable concepts without codable children: Secondary | ICD-10-CM

## 2010-08-08 LAB — BASIC METABOLIC PANEL
BUN: 11 mg/dL (ref 6–23)
CO2: 31 mEq/L (ref 19–32)
Calcium: 10 mg/dL (ref 8.4–10.5)
Chloride: 96 mEq/L (ref 96–112)
Creatinine, Ser: 0.9 mg/dL (ref 0.4–1.2)

## 2010-08-08 NOTE — Progress Notes (Signed)
  Subjective:    Patient ID: Caitlin [name], female    DOB: 1945/04/23, 65 y.o.   MRN: [id number]  HPI Comes in for fu of  multiple medical problems :  Ear and dizziness :  Back on allegra D .  Has checked Bp readings , now in the low 100 range and no tachycardia.Brings in readings  Off the chlorthalidone and now on 1/2 of maxide per day and taking  K every other day BG  Controlled and 120 130  GERD only taking med as needed  ? zostavax to get  Review of Systems FM? Acting up but mostly joint pains but no swelling. No cp sob syncope vision changes  Past history family history social history reviewed in the electronic medical record.     Objective:   Physical Exam WDWN in nad  Brighter affect  Oriented x 3 and no noted deficits in memory, attention, and speech. Has log of bp and bg readings. heent: tms clear  eac nl        Assessment & Plan:  Ear  Still problematic     Feed back   Noise. cuase a problem but no vertigo at present . No change with diuretic change but now doesn't have hypotension.   Working dx was [employer] per ENT but i dont have the testing or the records.  Disc  [facility]  Otherwise  Just monitor and follow.  BP :readings  Better  No linger hypotensive   Leg cramps at times.   No change will have to follow the Potassium levels and mg  FM Joint pains  ? If OA or new problem  No alarm features seen on exam  Total visit > 50% spent counseling and coordinating care

## 2010-08-08 NOTE — Patient Instructions (Signed)
Stay on low dose diuretics  Will notify you  of labs when available. Unsure what else to try for the ear.  ROV  In 3 -4 months or as needed.

## 2010-08-10 ENCOUNTER — Encounter: Payer: Self-pay | Admitting: *Deleted

## 2010-08-15 ENCOUNTER — Telehealth: Payer: Self-pay | Admitting: *Deleted

## 2010-08-15 DIAGNOSIS — H9190 Unspecified hearing loss, unspecified ear: Secondary | ICD-10-CM

## 2010-08-15 DIAGNOSIS — H8109 Meniere's disease, unspecified ear: Secondary | ICD-10-CM

## 2010-08-15 NOTE — Telephone Encounter (Signed)
Pt wants to see [facility] ENT.

## 2010-08-22 ENCOUNTER — Other Ambulatory Visit: Payer: Self-pay | Admitting: Internal Medicine

## 2010-11-02 LAB — POCT HEMOGLOBIN-HEMACUE
Hemoglobin: 14.9
Operator id: 12362

## 2010-11-02 LAB — BASIC METABOLIC PANEL
BUN: 14
CO2: 27
Chloride: 104
Creatinine, Ser: 0.73

## 2010-11-08 ENCOUNTER — Ambulatory Visit (INDEPENDENT_AMBULATORY_CARE_PROVIDER_SITE_OTHER): Payer: 59

## 2010-11-08 DIAGNOSIS — Z23 Encounter for immunization: Secondary | ICD-10-CM

## 2010-12-12 ENCOUNTER — Encounter: Payer: Self-pay | Admitting: Internal Medicine

## 2010-12-12 ENCOUNTER — Ambulatory Visit (INDEPENDENT_AMBULATORY_CARE_PROVIDER_SITE_OTHER): Payer: 59 | Admitting: Internal Medicine

## 2010-12-12 VITALS — BP 100/80 | HR 60 | Wt 140.0 lb

## 2010-12-12 DIAGNOSIS — I10 Essential (primary) hypertension: Secondary | ICD-10-CM

## 2010-12-12 DIAGNOSIS — K219 Gastro-esophageal reflux disease without esophagitis: Secondary | ICD-10-CM

## 2010-12-12 DIAGNOSIS — K589 Irritable bowel syndrome without diarrhea: Secondary | ICD-10-CM

## 2010-12-12 DIAGNOSIS — L259 Unspecified contact dermatitis, unspecified cause: Secondary | ICD-10-CM

## 2010-12-12 DIAGNOSIS — R7309 Other abnormal glucose: Secondary | ICD-10-CM

## 2010-12-12 DIAGNOSIS — K449 Diaphragmatic hernia without obstruction or gangrene: Secondary | ICD-10-CM

## 2010-12-12 DIAGNOSIS — H9190 Unspecified hearing loss, unspecified ear: Secondary | ICD-10-CM

## 2010-12-12 DIAGNOSIS — L309 Dermatitis, unspecified: Secondary | ICD-10-CM

## 2010-12-12 MED ORDER — TRIAMCINOLONE ACETONIDE 0.1 % EX OINT: TOPICAL_OINTMENT | Freq: Two times a day (BID) | CUTANEOUS | Status: DC

## 2010-12-12 NOTE — Patient Instructions (Addendum)
Cortisone ointment 2 x per day for hand Winter eczema  Type rash if not better in 2 weeks then  May need to see dermatology. Agree with avoiding harsh chemicals on  Hands.  No change  In meds.  For now  But ok to add zantac  150 mg twice  A day.    When not on the dexilant.   Your blood pressure   Goal  Should be around 110-130 range  .  Caution with aleve only as needed.

## 2010-12-12 NOTE — Progress Notes (Signed)
  Subjective:    Patient ID: Caitlin [name], female    DOB: 24-Mar-1945, 65 y.o.   MRN: [id number]  HPI  Patient comes in today for follow up of  multiple medical problems.  GERD: ? If dexilant causes diarrhea. So taking prn.   Zantac  Helps some . Ear; good  Day today.  Depression wax and wanes.   Issues about death of sister.   Eating  Ok  Balancing reflux and  Dietary restrictions sugars etc  BG 101 range  Review of Systems Recent Jaw pain .    And earsa.   ? Some gritting at night.  No sob coughing bleeding  No fever uri hand rash and cracking   Past history family history social history reviewed in the electronic medical record.     Objective:   Physical Exam WDWN in  Slightly depressed   In nad  Neck: Supple without adenopathy or masses or bruits HEENT  tmj area tender no  Chest:  Clear to A&P without wheezes rales or rhonchi CV:  S1-S2 no gallops or murmurs peripheral perfusion is normal  Repeat bp in 110 range Ears no discharge  Neuro non focal  Hands  Redness  Cracking  No weeping.  MS no acute  Joint swelling       Assessment & Plan:  HT  Controlled   If too low and gives sx can call and we can adjust med. GERD se of med.  Also gastroparesis  Tmj   Poss dental intervention Hyperglycemia  Mood reactive  From sisters death  Counseled. About this   Expectant management.    Total visit > 50% spent counseling and coordinating care

## 2010-12-29 DIAGNOSIS — L309 Dermatitis, unspecified: Secondary | ICD-10-CM | POA: Insufficient documentation

## 2011-01-06 ENCOUNTER — Telehealth: Payer: Self-pay | Admitting: Internal Medicine

## 2011-01-06 ENCOUNTER — Encounter: Payer: Self-pay | Admitting: Family Medicine

## 2011-01-06 ENCOUNTER — Ambulatory Visit (INDEPENDENT_AMBULATORY_CARE_PROVIDER_SITE_OTHER): Payer: 59 | Admitting: Family Medicine

## 2011-01-06 VITALS — BP 128/84 | HR 109 | Temp 98.0°F | Wt 139.0 lb

## 2011-01-06 DIAGNOSIS — J40 Bronchitis, not specified as acute or chronic: Secondary | ICD-10-CM

## 2011-01-06 MED ORDER — AZITHROMYCIN 250 MG PO TABS: ORAL_TABLET | ORAL | Status: AC

## 2011-01-06 NOTE — Telephone Encounter (Signed)
Spoke to pt- head cold, sinus drainage, sore throat, low grade temp, coughing. Pt to come in and see Dr. Clent Ridges today.

## 2011-01-06 NOTE — Telephone Encounter (Signed)
Pt does not want to come in to see a doctor. Pt is experiencing clod like symptoms and is requesting a zpack to be called in for her. Please contact pt

## 2011-01-06 NOTE — Progress Notes (Signed)
  Subjective:    Patient ID: Caitlin [name], female    DOB: [birth year], 65 y.o.   MRN: [id number]  HPI Here for one week of chest congestion, fever, and a dry cough. Drinking fluids and using Mucinex DM.    Review of Systems  Constitutional: Positive for fever.  HENT: Negative.   Eyes: Negative.   Respiratory: Positive for cough.        Objective:   Physical Exam  Constitutional: She appears well-developed and well-nourished.  HENT:  Right Ear: External ear normal.  Left Ear: External ear normal.  Nose: Nose normal.  Mouth/Throat: Oropharynx is clear and moist. No oropharyngeal exudate.  Eyes: Conjunctivae are normal.  Pulmonary/Chest: Effort normal and breath sounds normal. She has no wheezes. She has no rales.       Scattered rhonchi   Lymphadenopathy:    She has no cervical adenopathy.          Assessment & Plan:  Recheck prn

## 2011-02-02 ENCOUNTER — Other Ambulatory Visit: Payer: Self-pay | Admitting: Internal Medicine

## 2011-03-01 ENCOUNTER — Other Ambulatory Visit: Payer: Self-pay | Admitting: Internal Medicine

## 2011-03-07 DIAGNOSIS — M26609 Unspecified temporomandibular joint disorder, unspecified side: Secondary | ICD-10-CM | POA: Diagnosis not present

## 2011-03-07 DIAGNOSIS — H903 Sensorineural hearing loss, bilateral: Secondary | ICD-10-CM | POA: Diagnosis not present

## 2011-03-28 ENCOUNTER — Other Ambulatory Visit: Payer: Self-pay | Admitting: Internal Medicine

## 2011-04-06 ENCOUNTER — Other Ambulatory Visit: Payer: Self-pay | Admitting: Internal Medicine

## 2011-04-13 ENCOUNTER — Telehealth: Payer: Self-pay

## 2011-04-13 DIAGNOSIS — R7303 Prediabetes: Secondary | ICD-10-CM

## 2011-04-13 NOTE — Telephone Encounter (Signed)
Received a fax from [facility] stating that pt request freestyle lancets #100 use bid with 5 refills. Dx code 790.29 (hyperglycemia) was used but per [facility] this diagnosis code was not accepted by Medicare.  Pls advise.

## 2011-04-19 DIAGNOSIS — R7303 Prediabetes: Secondary | ICD-10-CM | POA: Insufficient documentation

## 2011-04-19 HISTORY — DX: Prediabetes: R73.03

## 2011-04-19 NOTE — Telephone Encounter (Signed)
Tell patient what [facility] said. Her labs have been good although had been elevated in the past.   Medicare wont pay for strips unless dx: of diabetes listed  then only once a day unless on insulin  .  She has had  borderline diabetes in the past.     Will place dx of diabetes mellitus on her prob list  As controlled  Unless she wishes otherwise.

## 2011-04-20 ENCOUNTER — Telehealth: Payer: Self-pay | Admitting: Internal Medicine

## 2011-04-20 MED ORDER — FREESTYLE LANCETS MISC: Status: DC

## 2011-04-20 MED ORDER — GLUCOSE BLOOD VI STRP: ORAL_STRIP | Status: DC

## 2011-04-20 NOTE — Telephone Encounter (Signed)
Pt said that she needs the Lancets (FREESTYLE) lancets to be sent to [facility] on [address].

## 2011-04-20 NOTE — Telephone Encounter (Signed)
Pt is aware.  Rx re-faxed to pharmacy.

## 2011-04-20 NOTE — Telephone Encounter (Signed)
Rx sent to pharmacy   

## 2011-04-21 ENCOUNTER — Other Ambulatory Visit: Payer: Self-pay

## 2011-04-21 MED ORDER — FREESTYLE LANCETS MISC: Status: DC

## 2011-04-26 ENCOUNTER — Telehealth: Payer: Self-pay

## 2011-04-26 ENCOUNTER — Encounter: Payer: Self-pay | Admitting: Internal Medicine

## 2011-04-26 ENCOUNTER — Ambulatory Visit (INDEPENDENT_AMBULATORY_CARE_PROVIDER_SITE_OTHER): Payer: 59 | Admitting: Internal Medicine

## 2011-04-26 VITALS — BP 126/78 | HR 105 | Temp 98.0°F | Ht 60.75 in | Wt 139.0 lb

## 2011-04-26 DIAGNOSIS — IMO0001 Reserved for inherently not codable concepts without codable children: Secondary | ICD-10-CM

## 2011-04-26 DIAGNOSIS — R7303 Prediabetes: Secondary | ICD-10-CM

## 2011-04-26 DIAGNOSIS — K589 Irritable bowel syndrome without diarrhea: Secondary | ICD-10-CM

## 2011-04-26 DIAGNOSIS — R142 Eructation: Secondary | ICD-10-CM

## 2011-04-26 DIAGNOSIS — E538 Deficiency of other specified B group vitamins: Secondary | ICD-10-CM

## 2011-04-26 DIAGNOSIS — R141 Gas pain: Secondary | ICD-10-CM

## 2011-04-26 DIAGNOSIS — I10 Essential (primary) hypertension: Secondary | ICD-10-CM | POA: Diagnosis not present

## 2011-04-26 DIAGNOSIS — R7309 Other abnormal glucose: Secondary | ICD-10-CM

## 2011-04-26 DIAGNOSIS — Z79899 Other long term (current) drug therapy: Secondary | ICD-10-CM | POA: Diagnosis not present

## 2011-04-26 DIAGNOSIS — M549 Dorsalgia, unspecified: Secondary | ICD-10-CM | POA: Diagnosis not present

## 2011-04-26 DIAGNOSIS — Z0000 Encounter for general adult medical examination without abnormal findings: Secondary | ICD-10-CM

## 2011-04-26 DIAGNOSIS — K219 Gastro-esophageal reflux disease without esophagitis: Secondary | ICD-10-CM

## 2011-04-26 DIAGNOSIS — J309 Allergic rhinitis, unspecified: Secondary | ICD-10-CM | POA: Diagnosis not present

## 2011-04-26 LAB — LIPID PANEL
HDL: 76.4 mg/dL (ref >39.00)
LDL Cholesterol: 78 mg/dL (ref 0–99)
Total CHOL/HDL Ratio: 2
Triglycerides: 106 mg/dL (ref 0.0–149.0)

## 2011-04-26 LAB — HEPATIC FUNCTION PANEL
Alkaline Phosphatase: 59 U/L (ref 39–117)
Bilirubin, Direct: 0 mg/dL (ref 0.0–0.3)
Total Bilirubin: 0.4 mg/dL (ref 0.3–1.2)
Total Protein: 7.5 g/dL (ref 6.0–8.3)

## 2011-04-26 LAB — CBC WITH DIFFERENTIAL/PLATELET
Basophils Relative: 1 % (ref 0.0–3.0)
Eosinophils Absolute: 0.2 10*3/uL (ref 0.0–0.7)
Eosinophils Relative: 2.8 % (ref 0.0–5.0)
Lymphocytes Relative: 20 % (ref 12.0–46.0)
MCHC: 32.5 g/dL (ref 30.0–36.0)
MCV: 88.2 fl (ref 78.0–100.0)
Monocytes Absolute: 0.5 10*3/uL (ref 0.1–1.0)
Neutrophils Relative %: 69.2 % (ref 43.0–77.0)
Platelets: 292 10*3/uL (ref 150.0–400.0)
RBC: 5.15 Mil/uL — ABNORMAL HIGH (ref 3.87–5.11)
WBC: 7 10*3/uL (ref 4.5–10.5)

## 2011-04-26 LAB — TSH: TSH: 1.2 u[IU]/mL (ref 0.35–5.50)

## 2011-04-26 LAB — BASIC METABOLIC PANEL
BUN: 17 mg/dL (ref 6–23)
Calcium: 10 mg/dL (ref 8.4–10.5)
Creatinine, Ser: 1 mg/dL (ref 0.4–1.2)

## 2011-04-26 NOTE — Patient Instructions (Signed)
Continue lifestyle intervention healthy eating and exercise . Take  The acid protector when you take aleve.  If you have to take this.  Will notify you  of labs when available. Then plan follow up or 6 month check

## 2011-04-26 NOTE — Telephone Encounter (Signed)
Error

## 2011-04-26 NOTE — Progress Notes (Signed)
Subjective:    Patient ID: Caitlin [name], female    DOB: 1945/09/03, 66 y.o.   MRN: [id number]  HPI  Patient comes in today for Preventive Health Care visit  And fu med issues No major changes ; ,injury surgery or hospitalizations. GERD GI   Taking dexilant qod and still needs to take advil type med for shoulder which helps  Continue gi upset couldn't take other meds   Has sensitive bowel lots of gas.  HT stable BG  Hard time getting strips   Now on medicare . Helps her control her diet . Ear left   Better   Review of Systems ROS:  GEN/ HEENT: No fever, significant weight changes sweats headaches vision problems hearing changes,  Except the left ear off and on  CV/ PULM; No chest pain shortness of breath cough, syncope,edema  change in exercise tolerance. GI /GU: No  vomiting, change in bowel habits. No blood in the stool. No significant GU symptoms. SKIN/HEME: ,no acute skin rashes suspicious lesions or bleeding. No lymphadenopathy, nodules, masses.  NEURO/ PSYCH:  No neurologic signs such as weakness numbness. IMM/ Allergy: No unusual infections.  Allergy .   REST of 12 system review negative except as per HPI Past history family history social history reviewed in the electronic medical record.   Outpatient Prescriptions Prior to Visit  Medication Sig Dispense Refill  . cholecalciferol (VITAMIN D) 1000 UNITS tablet Take 2,000 Units by mouth daily.       [name] DEXILANT 60 MG capsule TAKE ONE CAPSULE BY MOUTH EVERY DAY  30 capsule  5  . fexofenadine-pseudoephedrine (ALLEGRA-D 24) 180-240 MG per 24 hr tablet Take 1 tablet by mouth daily.        [name] glucose blood (FREESTYLE LITE) test strip Test daily.  100 each  11  . KLOR-CON M10 10 MEQ tablet TAKE ONE TABLET BY MOUTH DAILY.TAKE 1 TABLET EVERY OTHER DAY WITH FLUID PILL  30 each  5  . Lancets (FREESTYLE) lancets Use daily.  100 each  4  . minoxidil (ROGAINE) 2 % external solution Apply topically 2 (two) times daily.        . Probiotic  Product (PROBIOTIC PO) Take 2 tablets by mouth daily.        [name] triamcinolone ointment (KENALOG) 0.1 % Apply topically 2 (two) times daily.  30 g  1  . triamterene-hydrochlorothiazide (MAXZIDE-25) 37.5-25 MG per tablet TAKE ONE-HALF TABLET BY MOUTH EVERY DAY.  15 tablet  2  . vitamin B-12 (CYANOCOBALAMIN) 100 MCG tablet Take 500 mcg by mouth daily.       . fluocinonide (LIDEX) 0.05 % cream Apply topically 2 (two) times daily.             Objective:   Physical Exam BP 126/78  Pulse 105  Temp(Src) 98 F (36.7 C) (Oral)  Ht 5' 0.75" (1.543 m)  Wt 139 lb (63.05 kg)  BMI 26.48 kg/m2  SpO2 94% Physical Exam: Vital signs reviewed MVH:QION is a well-developed well-nourished alert cooperative  white female who appears her stated age in no acute distress.  HEENT: normocephalic atraumatic , Eyes: PERRL EOM's full, conjunctiva clear, Nares: paten,t no deformity discharge or tenderness., Ears: no deformity EAC's clear TMs with normal landmarks. Mouth: clear OP, no lesions, edema.  Moist mucous membranes. Dentition in adequate repair. NECK: supple without masses, thyromegaly or bruits. Breast: normal by inspection . No dimpling, discharge, masses, tenderness or discharge .  CHEST/PULM:  Clear to auscultation and percussion  breath sounds equal no wheeze , rales or rhonchi. No chest wall deformities or tenderness. CV: PMI is nondisplaced, S1 S2 no gallops, murmurs, rubs. Peripheral pulses are full without delay.No JVD .  ABDOMEN: Bowel sounds normal nontender  No guard or rebound, no hepato splenomegal no CVA tenderness.  No hernia. Extremtities:  No clubbing cyanosis or edema, no acute joint swelling or redness no focal atrophy NEURO:  Oriented x3, cranial nerves 3-12 appear to be intact, no obvious focal weakness,gait within normal limits no abnormal reflexes or asymmetrical SKIN: No acute rashes normal turgor, color, no bruising or petechiae. PSYCH: Oriented, good eye contact, no obvious depression  anxiety,  Bright er affect todaycognition and judgment appear normal. LN: no cervical axillary inguinal adenopathy      Assessment & Plan:   Preventive Health Care Counseled regarding healthy nutrition, exercise, sleep, injury prevention, calcium vit d and healthy weight . Check on cost of zostavax   Had hysterectomy utd   Prediabetes   Did better with monitoring   And using  lifestyle intervention healthy eating and exercise .  To control the sugars.  Hypertension stable  allegies  On going  GERD HH  Problematic  Se of meds but taking dexilant qod to ctrol good effects bad effects  Joint pain shoulder taking aleve type meds prn as helps but risk of gi se.  Fibromyalgia Ear predicament comes and goes . Coping

## 2011-04-27 ENCOUNTER — Encounter: Payer: Self-pay | Admitting: Internal Medicine

## 2011-04-27 DIAGNOSIS — K219 Gastro-esophageal reflux disease without esophagitis: Secondary | ICD-10-CM | POA: Insufficient documentation

## 2011-04-27 DIAGNOSIS — Z0000 Encounter for general adult medical examination without abnormal findings: Secondary | ICD-10-CM | POA: Insufficient documentation

## 2011-05-08 DIAGNOSIS — H04129 Dry eye syndrome of unspecified lacrimal gland: Secondary | ICD-10-CM | POA: Diagnosis not present

## 2011-05-08 DIAGNOSIS — E119 Type 2 diabetes mellitus without complications: Secondary | ICD-10-CM | POA: Diagnosis not present

## 2011-05-08 DIAGNOSIS — H40019 Open angle with borderline findings, low risk, unspecified eye: Secondary | ICD-10-CM | POA: Diagnosis not present

## 2011-05-08 DIAGNOSIS — H2510 Age-related nuclear cataract, unspecified eye: Secondary | ICD-10-CM | POA: Diagnosis not present

## 2011-05-08 DIAGNOSIS — H35039 Hypertensive retinopathy, unspecified eye: Secondary | ICD-10-CM | POA: Diagnosis not present

## 2011-05-08 DIAGNOSIS — H524 Presbyopia: Secondary | ICD-10-CM | POA: Diagnosis not present

## 2011-05-09 ENCOUNTER — Other Ambulatory Visit: Payer: Self-pay | Admitting: Internal Medicine

## 2011-05-09 DIAGNOSIS — Z1231 Encounter for screening mammogram for malignant neoplasm of breast: Secondary | ICD-10-CM

## 2011-05-25 ENCOUNTER — Ambulatory Visit
Admission: RE | Admit: 2011-05-25 | Discharge: 2011-05-25 | Disposition: A | Discharge status: Home / Self Care | Payer: 59 | Source: Ambulatory Visit | Attending: Internal Medicine | Admitting: Internal Medicine

## 2011-05-25 DIAGNOSIS — Z1231 Encounter for screening mammogram for malignant neoplasm of breast: Secondary | ICD-10-CM

## 2011-05-28 ENCOUNTER — Other Ambulatory Visit: Payer: Self-pay | Admitting: Internal Medicine

## 2011-09-05 ENCOUNTER — Other Ambulatory Visit: Payer: Self-pay | Admitting: Internal Medicine

## 2011-10-17 ENCOUNTER — Ambulatory Visit (INDEPENDENT_AMBULATORY_CARE_PROVIDER_SITE_OTHER): Payer: Medicare Other

## 2011-10-17 DIAGNOSIS — Z23 Encounter for immunization: Secondary | ICD-10-CM | POA: Diagnosis not present

## 2011-10-27 ENCOUNTER — Ambulatory Visit (INDEPENDENT_AMBULATORY_CARE_PROVIDER_SITE_OTHER): Payer: Medicare Other | Admitting: Internal Medicine

## 2011-10-27 ENCOUNTER — Encounter: Payer: Self-pay | Admitting: Internal Medicine

## 2011-10-27 VITALS — BP 110/70 | HR 85 | Temp 98.2°F | Wt 147.0 lb

## 2011-10-27 DIAGNOSIS — R7309 Other abnormal glucose: Secondary | ICD-10-CM | POA: Diagnosis not present

## 2011-10-27 DIAGNOSIS — Z23 Encounter for immunization: Secondary | ICD-10-CM | POA: Diagnosis not present

## 2011-10-27 DIAGNOSIS — K219 Gastro-esophageal reflux disease without esophagitis: Secondary | ICD-10-CM | POA: Diagnosis not present

## 2011-10-27 DIAGNOSIS — M129 Arthropathy, unspecified: Secondary | ICD-10-CM | POA: Diagnosis not present

## 2011-10-27 DIAGNOSIS — R7303 Prediabetes: Secondary | ICD-10-CM

## 2011-10-27 DIAGNOSIS — M1990 Unspecified osteoarthritis, unspecified site: Secondary | ICD-10-CM

## 2011-10-27 DIAGNOSIS — K3184 Gastroparesis: Secondary | ICD-10-CM

## 2011-10-27 DIAGNOSIS — G479 Sleep disorder, unspecified: Secondary | ICD-10-CM | POA: Diagnosis not present

## 2011-10-27 MED ORDER — MELOXICAM 7.5 MG PO TABS: ORAL_TABLET | ORAL | Status: DC

## 2011-10-27 NOTE — Patient Instructions (Signed)
Your blood sugars are good  And not the cause of the sx. I suspect arthritis pain is making you feel tired also.  Plan to get   Rheum opinion about dx and  If other interventions would help.   Can try Mobic  At night for a few night and see if sleep better with no aggravation of gi side effects .

## 2011-10-27 NOTE — Progress Notes (Signed)
Subjective:    Patient ID: Caitlin [name], female    DOB: [birth year], 66 y.o.   MRN: [id number]  HPI Patient comes in today for follow up of  multiple medical problems.  Is not doing as well as last time.  Many c/o achy back and hands  Joints and bones . No new rashes  Hx of gi distress  On nsaid  Even on dexilant.  Although when takes aleve it helps her some.  Tylenol causes stimulation?  She takes antacid otc  At night to help with her nocturnal reflux   BG doing well checking it to see if cause of sx but ok in the low 100 range and sometimes 90 .  Ears  Left sx are problematic recently  Pain  And cracking  ( has had thorough work up)   Med list reviewed  Outpatient Prescriptions Prior to Visit  Medication Sig Dispense Refill  . cholecalciferol (VITAMIN D) 1000 UNITS tablet Take 2,000 Units by mouth daily.       [name] DEXILANT 60 MG capsule TAKE ONE CAPSULE BY MOUTH EVERY DAY  30 capsule  5  . fexofenadine-pseudoephedrine (ALLEGRA-D 24) 180-240 MG per 24 hr tablet Take 1 tablet by mouth daily.        . fluocinonide (LIDEX) 0.05 % cream Apply topically 2 (two) times daily.        [name] glucose blood (FREESTYLE LITE) test strip Test daily.  100 each  11  . KLOR-CON M10 10 MEQ tablet TAKE ONE TABLET BY MOUTH DAILY.TAKE 1 TABLET EVERY OTHER DAY WITH FLUID PILL  30 each  5  . Lancets (FREESTYLE) lancets Use daily.  100 each  4  . minoxidil (ROGAINE) 2 % external solution Apply topically 2 (two) times daily.        . Probiotic Product (PROBIOTIC PO) Take 2 tablets by mouth daily.        [name] triamterene-hydrochlorothiazide (MAXZIDE-25) 37.5-25 MG per tablet TAKE ONE-HALF TABLET BY MOUTH EVERY DAY  45 tablet  6  . vitamin B-12 (CYANOCOBALAMIN) 100 MCG tablet Take 500 mcg by mouth daily.       [name] triamcinolone ointment (KENALOG) 0.1 % Apply topically 2 (two) times daily.  30 g  1    Review of Systems NO fever chills uti sx vomiting bleeding falling  Has pain left shin area  Trying to walk but not that  much no new shoes .has hand dermatitis \\Past  history family history social history reviewed in the electronic medical record. Hx of e nodosum     Objective:   Physical Exam BP 110/70  Pulse 85  Temp 98.2 F (36.8 C) (Oral)  Wt 147 lb (66.679 kg)  SpO2 96% wdwn in nad   Affect a bit down  Oriented x 3 and no noted deficits in memory, attention, and speech. HEENT nad Neck: Supple without adenopathy or masses or bruits Chest:  Clear to A&P without wheezes rales or rhonchi CV:  S1-S2 no gallops or murmurs peripheral perfusion is normal Abdomen:  Sof,t normal bowel sounds without hepatosplenomegaly, no guarding rebound or masses no CVA tenderness Hands  ?OA changes  Few nodules   No acute rashes no acute effusions.  Affect more flat than last time  Says tired from poor sleep.     Assessment & Plan:   Fatigue malaise continuing prob from poor sleep  She awakens 3-4 times per night from discomfort  Hand arthritis  And now left shin  Pain without injury ?  If shin splints type issue.   Ear issues still bothering her.  Some prog tmj joint vs other  Pre diabetes  Good at this point .   GERD with hh and hx of stricture and nissen surgery  Limitations of meds problematic cause of side effects. Had irritability  and hostility with cymbalta some help with aleve but after  A few days gets gastritis sx and gi effects even on Dexilant  Takes antacid at night to help the reflux.  With her HH Tylenol causes activation and minimally helpful. Avoiding celebrex cause of sulfa allergy Try mobic at night to see if helps with discomfort .  Uncertain which problem is causing the most sx.  ? Consider other night rx .  ? If med like muscle relaxant would help at night or not.   Asks about tdap has grand kids  Ok to get ? If cost reimbursed.   Get one time today    Advise rheum relook  Had eval years ago  But now sx more problematic  Suspect OA and FM but would like to get opinion to make sure no other  cause of her sx such as inflammatory arthritis , PMR, ( has dematitis but not  A dx of psoriasis  Known )    and advice on ideas  To help her discomfort at night .  I thibk if she can sleep more soundly  she will feel better,   Will refer  Fu at her yearly 6 months but earlier depending on above eval and course.  Total visit > 50% spent counseling and coordinating care

## 2011-10-28 ENCOUNTER — Encounter: Payer: Self-pay | Admitting: Internal Medicine

## 2011-10-28 DIAGNOSIS — M1990 Unspecified osteoarthritis, unspecified site: Secondary | ICD-10-CM | POA: Insufficient documentation

## 2011-11-06 DIAGNOSIS — M19049 Primary osteoarthritis, unspecified hand: Secondary | ICD-10-CM | POA: Diagnosis not present

## 2011-11-06 DIAGNOSIS — M1990 Unspecified osteoarthritis, unspecified site: Secondary | ICD-10-CM | POA: Diagnosis not present

## 2011-11-06 DIAGNOSIS — M2550 Pain in unspecified joint: Secondary | ICD-10-CM | POA: Diagnosis not present

## 2011-11-06 DIAGNOSIS — M79609 Pain in unspecified limb: Secondary | ICD-10-CM | POA: Diagnosis not present

## 2011-11-06 DIAGNOSIS — IMO0002 Reserved for concepts with insufficient information to code with codable children: Secondary | ICD-10-CM | POA: Diagnosis not present

## 2011-11-28 ENCOUNTER — Ambulatory Visit (INDEPENDENT_AMBULATORY_CARE_PROVIDER_SITE_OTHER): Payer: Medicare Other | Admitting: Internal Medicine

## 2011-11-28 ENCOUNTER — Encounter: Payer: Self-pay | Admitting: Internal Medicine

## 2011-11-28 ENCOUNTER — Telehealth: Payer: Self-pay | Admitting: Internal Medicine

## 2011-11-28 VITALS — BP 128/84 | HR 110 | Temp 98.4°F | Wt 145.0 lb

## 2011-11-28 DIAGNOSIS — K529 Noninfective gastroenteritis and colitis, unspecified: Secondary | ICD-10-CM

## 2011-11-28 DIAGNOSIS — M1990 Unspecified osteoarthritis, unspecified site: Secondary | ICD-10-CM

## 2011-11-28 DIAGNOSIS — K5289 Other specified noninfective gastroenteritis and colitis: Secondary | ICD-10-CM | POA: Diagnosis not present

## 2011-11-28 DIAGNOSIS — E860 Dehydration: Secondary | ICD-10-CM

## 2011-11-28 DIAGNOSIS — K219 Gastro-esophageal reflux disease without esophagitis: Secondary | ICD-10-CM

## 2011-11-28 DIAGNOSIS — K3184 Gastroparesis: Secondary | ICD-10-CM

## 2011-11-28 HISTORY — DX: Unspecified osteoarthritis, unspecified site: M19.90

## 2011-11-28 MED ORDER — ONDANSETRON 4 MG PO TBDP: 4.0000 mg | ORAL_TABLET | Freq: Three times a day (TID) | ORAL | Status: DC | PRN

## 2011-11-28 NOTE — Progress Notes (Signed)
Chief Complaint  Patient presents with  . Emesis    Started last nigh after dinner.  . Diarrhea  . Nausea    HPI Patient comes in today for SDA for  new problem evaluation. Here with husband.  Went out to eat last pm and felt nausea and then diarrhea  About 5 am    began to have recurrent vomiting and has been unable to keep anything down since that time. There is no fever or blood in the stools some abdominal cramping but not severe. She has somewhat of a headache. Her husband is not sick. Has been getting for dinner last night.    Last  Pill given by [place] which I believe is [facility] she got immediate diarrhea so stopped taking it she has seen a rheumatologist who offered topical NSAID and physical therapy she was afraid to use the topical in case it caused GI problems cousin was related to aspirin. At this time chose not to do the physical therapy.  ROS: See pertinent positives and negatives per HPI. No current UTI symptoms although week or so ago she had some bladder dysfunction to cranberry juice and felt worse but since then got better. Denies any vaginal symptoms.  Past Medical History  Diagnosis Date  . Allergy   . GERD (gastroesophageal reflux disease)     History of stricture and dilatation and Nissen surgery  . Hypertension   . Mood disorder   . Erythema nodosum     Tends to be seasonal has had a negative chest x-ray in the past negative PPD  . Diverticulosis of colon   . Meniere's disease     ? dx per dr Brion Aliment    Family History  Problem Relation Age of Onset  . Cancer Mother   . Depression Mother   . Depression Father   . COPD Sister     Deceased 05-Jan-2010 . Nephrolithiasis      History   Social History  . Marital Status: Married    Spouse Name: N/A    Number of Children: N/A  . Years of Education: N/A   Social History Main Topics  . Smoking status: Never Smoker   . Smokeless tobacco: Never Used  . Alcohol Use: No  . Drug Use: No  . Sexually Active:  None   Other Topics Concern  . None   Social History Narrative   UnemployedMarriedHH of 2    Has grandchildren out of stateNo petsSis. died 12-21-11COPD    ` Outpatient Encounter Prescriptions as of 11/28/2011  Medication Sig Dispense Refill  . cholecalciferol (VITAMIN D) 1000 UNITS tablet Take 2,000 Units by mouth daily.       [name] DEXILANT 60 MG capsule TAKE ONE CAPSULE BY MOUTH EVERY DAY  30 capsule  5  . fexofenadine-pseudoephedrine (ALLEGRA-D 24) 180-240 MG per 24 hr tablet Take 1 tablet by mouth daily.        [name] glucose blood (FREESTYLE LITE) test strip Test daily.  100 each  11  . KLOR-CON M10 10 MEQ tablet TAKE ONE TABLET BY MOUTH DAILY.TAKE 1 TABLET EVERY OTHER DAY WITH FLUID PILL  30 each  5  . Lancets (FREESTYLE) lancets Use daily.  100 each  4  . minoxidil (ROGAINE) 2 % external solution Apply topically 2 (two) times daily.        . Probiotic Product (PROBIOTIC PO) Take 2 tablets by mouth daily.        [name] triamterene-hydrochlorothiazide (MAXZIDE-25)  37.5-25 MG per tablet TAKE ONE-HALF TABLET BY MOUTH EVERY DAY  45 tablet  6  . vitamin B-12 (CYANOCOBALAMIN) 100 MCG tablet Take 500 mcg by mouth daily.       . ondansetron (ZOFRAN-ODT) 4 MG disintegrating tablet Take 1 tablet (4 mg total) by mouth every 8 (eight) hours as needed for nausea.  20 tablet  0  . [DISCONTINUED] fluocinonide (LIDEX) 0.05 % cream Apply topically 2 (two) times daily.        . [DISCONTINUED] meloxicam (MOBIC) 7.5 MG tablet Take 1 - 2 per day as needed for arthritis pain.  60 tablet  1  . [DISCONTINUED] triamcinolone ointment (KENALOG) 0.1 % Apply topically 2 (two) times daily.  30 g  1     EXAM:  BP 128/84  Pulse 110  Temp 98.4 F (36.9 C) (Oral)  Wt 145 lb (65.772 kg)  SpO2 95%   GENERAL: vitals reviewed and listed above, alert, oriented, appears tired nontoxic in no acute distress. HEENT: atraumatic, conjunctiva  clear, no obvious abnormalities on inspection of external nose and ears OP : no  lesion edema or exudate moist mucous membranes dry lips  NECK: no obvious masses on inspection palpation   LUNGS: clear to auscultation bilaterally, no wheezes, rales or rhonchi, good air movement  CV: HRRR, no clubbing cyanosis or  peripheral edema nl cap refill  Abdomen soft without organomegaly decreased bowel sounds no guarding rebound although points to the lower abdomen as area of concern no flank pain MS: moves all extremities without noticeable focal  abnormality Skin no acute rashes no jaundice normal capillary refill. PSYCH:cooperative, no obvious depression or anxiety  ASSESSMENT AND PLAN:  Discussed the following assessment and plan:  1. Acute gastroenteritis    Presumed viral on top of baseline GI dysfunction. Expectant management hydration IM Phenergan and by mouth Zofran Pedialyte and fluid rehydration ED treatmentpr  2. Mild dehydration   3. Osteoarthritis    After better reasonable to try the pen said topical on one finger check for se    Disc with husband and pt  husband is driving.  -Patient advised to return or notify health care team  immediately if symptoms worsen or persist or new concerns arise.  Patient Instructions  Your symptoms appear to be acute gastroenteritis probably from a viral infection such as norovirus. Antinausea medication and try to take small amounts of  Clear liquids to avoid sever dehydration until the infection passes. If you are unable to keep any liquids down over the next 12 hours or feel extremely dehydrated he may need to go to the emergency room for IV fluids rehydration.  Consider rehydrating with such as Pedialyte.   Can try the oral antinausea medicine if needed in the meantime .  You can try the topical anti-inflammatory on one finger after your better from this illness. Expect the severity of the symptoms to last about 72 hours or less with some discomfort bloating etc. lasting longer. Contact us if you have fever blood in your stool  or not getting better.  Viral Gastroenteritis Viral gastroenteritis is also known as stomach flu. This condition affects the stomach and intestinal tract. It can cause sudden diarrhea and vomiting. The illness typically lasts 3 to 8 days. Most people develop an immune response that eventually gets rid of the virus. While this natural response develops, the virus can make you quite ill. CAUSES  Many different viruses can cause gastroenteritis, such as rotavirus or noroviruses. You can catch  one of these viruses by consuming contaminated food or water. You may also catch a virus by sharing utensils or other personal items with an infected person or by touching a contaminated surface. SYMPTOMS  The most common symptoms are diarrhea and vomiting. These problems can cause a severe loss of body fluids (dehydration) and a body salt (electrolyte) imbalance. Other symptoms may include:  Fever.  Headache.  Fatigue.  Abdominal pain. DIAGNOSIS  Your caregiver can usually diagnose viral gastroenteritis based on your symptoms and a physical exam. A stool sample may also be taken to test for the presence of viruses or other infections. TREATMENT  This illness typically goes away on its own. Treatments are aimed at rehydration. The most serious cases of viral gastroenteritis involve vomiting so severely that you are not able to keep fluids down. In these cases, fluids must be given through an intravenous line (IV). HOME CARE INSTRUCTIONS   Drink enough fluids to keep your urine clear or pale yellow. Drink small amounts of fluids frequently and increase the amounts as tolerated.  Ask your caregiver for specific rehydration instructions.  Avoid:  Foods high in sugar.  Alcohol.  Carbonated drinks.  Tobacco.  Juice.  Caffeine drinks.  Extremely hot or cold fluids.  Fatty, greasy foods.  Too much intake of anything at one time.  Dairy products until 24 to 48 hours after diarrhea  stops.  You may consume probiotics. Probiotics are active cultures of beneficial bacteria. They may lessen the amount and number of diarrheal stools in adults. Probiotics can be found in yogurt with active cultures and in supplements.  Wash your hands well to avoid spreading the virus.  Only take over-the-counter or prescription medicines for pain, discomfort, or fever as directed by your caregiver. Do not give aspirin to children. Antidiarrheal medicines are not recommended.  Ask your caregiver if you should continue to take your regular prescribed and over-the-counter medicines.  Keep all follow-up appointments as directed by your caregiver. SEEK IMMEDIATE MEDICAL CARE IF:   You are unable to keep fluids down.  You do not urinate at least once every 6 to 8 hours.  You develop shortness of breath.  You notice blood in your stool or vomit. This may look like coffee grounds.  You have abdominal pain that increases or is concentrated in one small area (localized).  You have persistent vomiting or diarrhea.  You have a fever.  The patient is a child younger than 3 months, and he or she has a fever.  The patient is a child older than 3 months, and he or she has a fever and persistent symptoms.  The patient is a child older than 3 months, and he or she has a fever and symptoms suddenly get worse.  The patient is a baby, and he or she has no tears when crying. MAKE SURE YOU:   Understand these instructions.  Will watch your condition.  Will get help right away if you are not doing well or get worse. Document Released: 01/02/2005 Document Revised: 03/27/2011 Document Reviewed: 10/19/2010 [facility] Patient Information 2013 [place], [place].      Neta Mends. Panosh M.D.

## 2011-11-28 NOTE — Telephone Encounter (Signed)
Caller: Caitlin [name]/Spouse; Patient Name: Caitlin [name]; PCP: Berniece Andreas [facility]); Best Callback Phone Number: [phone]. Call confirming appt @ 1545 today with Dr. Fabian Sharp. No triage.

## 2011-11-28 NOTE — Telephone Encounter (Signed)
Noted.  Pt has extensive gi problems.  WP will evaluate when she gets here.

## 2011-11-28 NOTE — Telephone Encounter (Signed)
Caller: William/Spouse; Patient Name: Caitlin [name]; PCP: Berniece Andreas [facility]); Best Callback Phone Number: [phone].  Caller states she has had vomiting with diarrhea since 0500 11/28/11.   Temp/tactile.  Per protocol, advised appt today; caller states her vomiting is so continuous that he cannot put her into the car to bring her.  RN unable to offer antiemetic Rx because there are open appts; appt scheduled 1545 11/28/11 with Dr. Fabian Sharp.  INfo to office for provider review/consideration of Rx for nausea/vomiting.   Uses [facility]. May reach patient at [phone].

## 2011-11-28 NOTE — Patient Instructions (Addendum)
Your symptoms appear to be acute gastroenteritis probably from a viral infection such as norovirus. Antinausea medication and try to take small amounts of  Clear liquids to avoid sever dehydration until the infection passes. If you are unable to keep any liquids down over the next 12 hours or feel extremely dehydrated he may need to go to the emergency room for IV fluids rehydration.  Consider rehydrating with such as Pedialyte.   Can try the oral antinausea medicine if needed in the meantime .  You can try the topical anti-inflammatory on one finger after your better from this illness. Expect the severity of the symptoms to last about 72 hours or less with some discomfort bloating etc. lasting longer. Contact us if you have fever blood in your stool or not getting better.  Viral Gastroenteritis Viral gastroenteritis is also known as stomach flu. This condition affects the stomach and intestinal tract. It can cause sudden diarrhea and vomiting. The illness typically lasts 3 to 8 days. Most people develop an immune response that eventually gets rid of the virus. While this natural response develops, the virus can make you quite ill. CAUSES  Many different viruses can cause gastroenteritis, such as rotavirus or noroviruses. You can catch one of these viruses by consuming contaminated food or water. You may also catch a virus by sharing utensils or other personal items with an infected person or by touching a contaminated surface. SYMPTOMS  The most common symptoms are diarrhea and vomiting. These problems can cause a severe loss of body fluids (dehydration) and a body salt (electrolyte) imbalance. Other symptoms may include:  Fever.  Headache.  Fatigue.  Abdominal pain. DIAGNOSIS  Your caregiver can usually diagnose viral gastroenteritis based on your symptoms and a physical exam. A stool sample may also be taken to test for the presence of viruses or other infections. TREATMENT  This illness  typically goes away on its own. Treatments are aimed at rehydration. The most serious cases of viral gastroenteritis involve vomiting so severely that you are not able to keep fluids down. In these cases, fluids must be given through an intravenous line (IV). HOME CARE INSTRUCTIONS   Drink enough fluids to keep your urine clear or pale yellow. Drink small amounts of fluids frequently and increase the amounts as tolerated.  Ask your caregiver for specific rehydration instructions.  Avoid:  Foods high in sugar.  Alcohol.  Carbonated drinks.  Tobacco.  Juice.  Caffeine drinks.  Extremely hot or cold fluids.  Fatty, greasy foods.  Too much intake of anything at one time.  Dairy products until 24 to 48 hours after diarrhea stops.  You may consume probiotics. Probiotics are active cultures of beneficial bacteria. They may lessen the amount and number of diarrheal stools in adults. Probiotics can be found in yogurt with active cultures and in supplements.  Wash your hands well to avoid spreading the virus.  Only take over-the-counter or prescription medicines for pain, discomfort, or fever as directed by your caregiver. Do not give aspirin to children. Antidiarrheal medicines are not recommended.  Ask your caregiver if you should continue to take your regular prescribed and over-the-counter medicines.  Keep all follow-up appointments as directed by your caregiver. SEEK IMMEDIATE MEDICAL CARE IF:   You are unable to keep fluids down.  You do not urinate at least once every 6 to 8 hours.  You develop shortness of breath.  You notice blood in your stool or vomit. This may look like coffee grounds.  You have abdominal pain that increases or is concentrated in one small area (localized).  You have persistent vomiting or diarrhea.  You have a fever.  The patient is a child younger than 3 months, and he or she has a fever.  The patient is a child older than 3 months, and he  or she has a fever and persistent symptoms.  The patient is a child older than 3 months, and he or she has a fever and symptoms suddenly get worse.  The patient is a baby, and he or she has no tears when crying. MAKE SURE YOU:   Understand these instructions.  Will watch your condition.  Will get help right away if you are not doing well or get worse. Document Released: 01/02/2005 Document Revised: 03/27/2011 Document Reviewed: 10/19/2010 [facility] Patient Information 2013 [place], [place].

## 2012-02-23 DIAGNOSIS — H1030 Unspecified acute conjunctivitis, unspecified eye: Secondary | ICD-10-CM | POA: Diagnosis not present

## 2012-02-23 DIAGNOSIS — H40019 Open angle with borderline findings, low risk, unspecified eye: Secondary | ICD-10-CM | POA: Diagnosis not present

## 2012-02-23 DIAGNOSIS — H04129 Dry eye syndrome of unspecified lacrimal gland: Secondary | ICD-10-CM | POA: Diagnosis not present

## 2012-04-26 ENCOUNTER — Ambulatory Visit: Payer: Medicare Other | Admitting: Internal Medicine

## 2012-04-29 ENCOUNTER — Encounter: Payer: Self-pay | Admitting: Internal Medicine

## 2012-04-29 ENCOUNTER — Other Ambulatory Visit: Payer: Self-pay | Admitting: Family Medicine

## 2012-04-29 ENCOUNTER — Ambulatory Visit (INDEPENDENT_AMBULATORY_CARE_PROVIDER_SITE_OTHER): Payer: Medicare Other | Admitting: Internal Medicine

## 2012-04-29 VITALS — BP 132/84 | HR 97 | Temp 97.9°F | Ht 59.75 in | Wt 147.0 lb

## 2012-04-29 DIAGNOSIS — R7303 Prediabetes: Secondary | ICD-10-CM

## 2012-04-29 DIAGNOSIS — R7309 Other abnormal glucose: Secondary | ICD-10-CM | POA: Diagnosis not present

## 2012-04-29 DIAGNOSIS — E538 Deficiency of other specified B group vitamins: Secondary | ICD-10-CM

## 2012-04-29 DIAGNOSIS — E7800 Pure hypercholesterolemia, unspecified: Secondary | ICD-10-CM | POA: Diagnosis not present

## 2012-04-29 DIAGNOSIS — K3184 Gastroparesis: Secondary | ICD-10-CM

## 2012-04-29 DIAGNOSIS — K219 Gastro-esophageal reflux disease without esophagitis: Secondary | ICD-10-CM

## 2012-04-29 DIAGNOSIS — Z0000 Encounter for general adult medical examination without abnormal findings: Secondary | ICD-10-CM | POA: Diagnosis not present

## 2012-04-29 DIAGNOSIS — K589 Irritable bowel syndrome without diarrhea: Secondary | ICD-10-CM

## 2012-04-29 DIAGNOSIS — R739 Hyperglycemia, unspecified: Secondary | ICD-10-CM

## 2012-04-29 DIAGNOSIS — IMO0001 Reserved for inherently not codable concepts without codable children: Secondary | ICD-10-CM

## 2012-04-29 HISTORY — DX: Pure hypercholesterolemia, unspecified: E78.00

## 2012-04-29 LAB — BASIC METABOLIC PANEL
BUN: 14 mg/dL (ref 6–23)
CO2: 32 mEq/L (ref 19–32)
Chloride: 100 mEq/L (ref 96–112)
Glucose, Bld: 92 mg/dL (ref 70–99)
Potassium: 4.3 mEq/L (ref 3.5–5.1)
Sodium: 140 mEq/L (ref 135–145)

## 2012-04-29 LAB — HEPATIC FUNCTION PANEL
Albumin: 4.4 g/dL (ref 3.5–5.2)
Alkaline Phosphatase: 54 U/L (ref 39–117)
Total Protein: 7.5 g/dL (ref 6.0–8.3)

## 2012-04-29 LAB — CBC WITH DIFFERENTIAL/PLATELET
Basophils Absolute: 0.1 10*3/uL (ref 0.0–0.1)
Eosinophils Absolute: 0.2 10*3/uL (ref 0.0–0.7)
HCT: 42.9 % (ref 36.0–46.0)
Hemoglobin: 14.3 g/dL (ref 12.0–15.0)
Lymphs Abs: 2 10*3/uL (ref 0.7–4.0)
MCHC: 33.2 g/dL (ref 30.0–36.0)
MCV: 86.5 fl (ref 78.0–100.0)
Monocytes Absolute: 0.6 10*3/uL (ref 0.1–1.0)
Monocytes Relative: 6.8 % (ref 3.0–12.0)
Neutro Abs: 6 10*3/uL (ref 1.4–7.7)
Platelets: 330 10*3/uL (ref 150.0–400.0)
RDW: 14.2 % (ref 11.5–14.6)

## 2012-04-29 LAB — LIPID PANEL
Cholesterol: 150 mg/dL (ref 0–200)
HDL: 70.1 mg/dL (ref >39.00)

## 2012-04-29 LAB — TSH: TSH: 0.95 u[IU]/mL (ref 0.35–5.50)

## 2012-04-29 MED ORDER — PANTOPRAZOLE SODIUM 40 MG PO TBEC: 40.0000 mg | DELAYED_RELEASE_TABLET | Freq: Every day | ORAL | Status: DC

## 2012-04-29 MED ORDER — GLUCOSE BLOOD VI STRP: ORAL_STRIP | Status: DC

## 2012-04-29 NOTE — Patient Instructions (Addendum)
Try  Generic protonix    Every day .    Instead    dexilant .     To see  If helps.   Will send it in.    Will notify you  of labs when available.   ROV in 6 months or as needed.  Hg a1c previsit  It appears sthat you last  prnumococcal vaccine was in 2010  youo should get a booster after age 66  We can do this at any time if not done.

## 2012-04-29 NOTE — Progress Notes (Signed)
Chief Complaint  Patient presents with  . Medicare Wellness    Needs a new rx for acid reflux.  Insurance is not going to cover Dexilant.    HPI: Patient comes in today for Preventive Medicare wellness visit . No major injuries, ed visits ,hospitalizations , new medications since last visit.  Many problems related to  Medicare  meds and rxed.  couldnet get her strips filled on time  .   Wont pay much for dexilant at all no matter what   Had med list tiered prilosec never helped and had se of som of the meds ? Which one.  Has formular info today  fibro is problematic with neck pain.  Allergy  On allegra d with some help.  GERD  Problematic  At times  dietary changes  Sometimes has nocturnal HB noted     Hearing:  Extensive eval  ent  Has tmj sx  And on diuretic for initally menieres or so bvs ht .  Vision:  No limitations at present . Last eye check UTD dr Elmer Picker glasses   Safety:  Has smoke detector and wears seat belts.  No firearms. No excess sun exposure. Sees dentist regularly.  Falls: no  Advance directive :  Reviewed  Has one.  Memory: Felt to be good  , no concern from her or her family.  Depression: No anhedonia unusual crying or depressive symptoms  Nutrition: Eats out a lit doesn't like to cooke ; adequate calcium and vitamin D. No swallowing chewing problems.  Injury: no major injuries in the last six months.  Other healthcare providers:  Reviewed today .  Social:  Lives with spouse married. No pets.   Preventive parameters: up-to-date  Reviewed   ADLS:   There are no problems or need for assistance  driving, feeding, obtaining food, dressing, toileting and bathing, managing money using phone. She is independent.  EXERCISE/ HABITS  Per week   No tobacco   no etoh   ROS:  GEN/ HEENT: No fever, significant weight changes sweats headaches vision problems hearing changes, CV/ PULM; No chest pain shortness of breath cough, syncope,edema  change in exercise  tolerance. GI /GU: No adominal pain, vomiting, change in bowel habits. No blood in the stool. No significant GU symptoms. SKIN/HEME: ,no acute skin rashes suspicious lesions or bleeding. No lymphadenopathy, nodules, masses.  NEURO/ PSYCH:  No neurologic signs such as weakness numbness. No depression anxiety. IMM/ Allergy: No unusual infections.  Allergy .   REST of 12 system review negative except as per HPI   Past Medical History  Diagnosis Date  . Allergy   . GERD (gastroesophageal reflux disease)     History of stricture and dilatation and Nissen surgery  . Hypertension   . Mood disorder   . Erythema nodosum     Tends to be seasonal has had a negative chest x-ray in the past negative PPD  . Diverticulosis of colon   . Meniere's disease     ? dx per dr Brion Aliment    Family History  Problem Relation Age of Onset  . Cancer Mother   . Depression Mother   . Depression Father   . COPD Sister     Deceased 2009-12-18 . Nephrolithiasis      History   Social History  . Marital Status: Married    Spouse Name: N/A    Number of Children: N/A  . Years of Education: N/A   Social History Main Topics  .  Smoking status: Never Smoker   . Smokeless tobacco: Never Used  . Alcohol Use: No  . Drug Use: No  . Sexually Active: None   Other Topics Concern  . None   Social History Narrative   Unemployed   Married   HH of 2    Has grandchildren out of state   No pets   Sis. died 11-20-2011COPD    Outpatient Encounter Prescriptions as of 04/29/2012  Medication Sig Dispense Refill  . cholecalciferol (VITAMIN D) 1000 UNITS tablet Take 2,000 Units by mouth daily.       . fexofenadine-pseudoephedrine (ALLEGRA-D 24) 180-240 MG per 24 hr tablet Take 1 tablet by mouth daily.        [name] KLOR-CON M10 10 MEQ tablet TAKE ONE TABLET BY MOUTH DAILY.TAKE 1 TABLET EVERY OTHER DAY WITH FLUID PILL  30 each  5  . Lancets (FREESTYLE) lancets Use daily.  100 each  4  . minoxidil (ROGAINE) 2 %  external solution Apply topically 2 (two) times daily.        . Probiotic Product (PROBIOTIC PO) Take 2 tablets by mouth daily.        [name] triamterene-hydrochlorothiazide (MAXZIDE-25) 37.5-25 MG per tablet TAKE ONE-HALF TABLET BY MOUTH EVERY DAY  45 tablet  6  . vitamin B-12 (CYANOCOBALAMIN) 100 MCG tablet Take 500 mcg by mouth daily.       . [DISCONTINUED] glucose blood (FREESTYLE LITE) test strip Test daily.  100 each  11  . pantoprazole (PROTONIX) 40 MG tablet Take 1 tablet (40 mg total) by mouth daily.  30 tablet  11  . [DISCONTINUED] DEXILANT 60 MG capsule TAKE ONE CAPSULE BY MOUTH EVERY DAY  30 capsule  5  . [DISCONTINUED] ondansetron (ZOFRAN-ODT) 4 MG disintegrating tablet Take 1 tablet (4 mg total) by mouth every 8 (eight) hours as needed for nausea.  20 tablet  0   No facility-administered encounter medications on file as of 04/29/2012.    EXAM:  BP 132/84  Pulse 97  Temp(Src) 97.9 F (36.6 C) (Oral)  Ht 4' 11.75" (1.518 m)  Wt 147 lb (66.679 kg)  BMI 28.94 kg/m2  SpO2 97%  Body mass index is 28.94 kg/(m^2).  Physical Exam: Vital signs reviewed [id number] is a well-developed well-nourished alert cooperative   who appears stated age in no acute distress. Mildly  Flat affect  HEENT: normocephalic atraumatic , Eyes: PERRL EOM's full, conjunctiva clear, Nares: paten,t no deformity discharge or tenderness., Ears: no deformity EAC's clear TMs with normal landmarks. Mouth: clear OP, no lesions, edema.  Moist mucous membranes. Dentition in adequate repair. NECK: supple without masses, thyromegaly or bruits. CHEST/PULM:  Clear to auscultation and percussion breath sounds equal no wheeze , rales or rhonchi. No chest wall deformities or tenderness. CV: PMI is nondisplaced, S1 S2 no gallops, murmurs, rubs. Peripheral pulses are full without delay.No JVD .  Breast: normal by inspection . No dimpling, discharge, masses, tenderness or discharge . ABDOMEN: Bowel sounds normal nontender  No guard  or rebound, no hepato splenomegal no CVA tenderness.  No hernia. Extremtities:  No clubbing cyanosis or edema, no acute joint swelling or redness no focal atrophy right foot small corn right 4th met area  NEURO:  Oriented x3, cranial nerves 3-12 appear to be intact, no obvious focal weakness,gait within normal limits no abnormal reflexes or asymmetrical SKIN: No acute rashes normal turgor, color, no bruising or petechiae. PSYCH: Oriented, good eye contactsomewhat flat will smile  No anxiety  cognition and judgment appear normal. LN: no cervical axillary inguinal adenopathy No noted deficits in memory, attention, and speech.   Lab Results  Component Value Date   WBC 9.0 04/29/2012   HGB 14.3 04/29/2012   HCT 42.9 04/29/2012   PLT 330.0 04/29/2012   GLUCOSE 92 04/29/2012   CHOL 150 04/29/2012   TRIG 99.0 04/29/2012   HDL 70.10 04/29/2012   LDLCALC 60 04/29/2012   ALT 16 04/29/2012   AST 19 04/29/2012   NA 140 04/29/2012   K 4.3 04/29/2012   CL 100 04/29/2012   CREATININE 0.9 04/29/2012   BUN 14 04/29/2012   CO2 32 04/29/2012   TSH 0.95 04/29/2012   HGBA1C 5.9 04/29/2012   MICROALBUR 1.6 04/26/2011    ASSESSMENT AND PLAN:  Discussed the following assessment and plan:  Visit for preventive health examination - Plan: Basic metabolic panel, CBC with Differential, Hemoglobin A1c, Hepatic function panel, Lipid panel, TSH  GERD (gastroesophageal reflux disease) - problematic  insuance change medicare high cost of med that has worked the best try gen protonix  - Plan: Basic metabolic panel, CBC with Differential, Hemoglobin A1c, Hepatic function panel, Lipid panel, TSH  Gastroparesis - Plan: Basic metabolic panel, CBC with Differential, Hemoglobin A1c, Hepatic function panel, Lipid panel, TSH  FIBROMYALGIA - Plan: Basic metabolic panel, CBC with Differential, Hemoglobin A1c, Hepatic function panel, Lipid panel, TSH  HYPERGLYCEMIA - Plan: Basic metabolic panel, CBC with Differential, Hemoglobin A1c,  Hepatic function panel, Lipid panel, TSH  Vitamin B12 deficiency - Plan: Basic metabolic panel, CBC with Differential, Hemoglobin A1c, Hepatic function panel, Lipid panel, TSH  Elevated cholesterol - in past  ok last year   check this year and if ok then  in a few uyears she has risk with prediabetic state. - Plan: Basic metabolic panel, CBC with Differential, Hemoglobin A1c, Hepatic function panel, Lipid panel, TSH  Prediabetes - stable at this time  Irritable bowel syndrome  Patient Care Team: Madelin Headings, MD as PCP - General Keturah Barre, MD Hart Carwin, MD (Gastroenterology) Rocco Serene, MD as Consulting Physician (Dermatology) Delon Sacramento, MD as Attending Physician (Ophthalmology) Nilda Simmer, MD as Attending Physician (Orthopedic Surgery)  Patient Instructions  Try  Generic protonix    Every day .    Instead    dexilant .     To see  If helps.   Will send it in.    Will notify you  of labs when available.   ROV in 6 months or as needed.  Hg a1c previsit  It appears sthat you last  prnumococcal vaccine was in 2010  youo should get a booster after age 47  We can do this at any time if not done.     Neta Mends. Yanessa Hocevar M.D.  Health Maintenance  Topic Date Due  . Pneumococcal Polysaccharide Vaccine Age 74 And Over  03/01/2011  . Influenza Vaccine  09/16/2012  . Mammogram  05/24/2013  . Colonoscopy  10/04/2015  . Tetanus/tdap  10/26/2021  . Zostavax  Completed   Health Maintenance Review

## 2012-05-01 ENCOUNTER — Encounter: Payer: Self-pay | Admitting: Family Medicine

## 2012-05-13 ENCOUNTER — Other Ambulatory Visit: Payer: Self-pay

## 2012-05-13 DIAGNOSIS — H40019 Open angle with borderline findings, low risk, unspecified eye: Secondary | ICD-10-CM | POA: Diagnosis not present

## 2012-05-13 DIAGNOSIS — Z1231 Encounter for screening mammogram for malignant neoplasm of breast: Secondary | ICD-10-CM

## 2012-05-13 DIAGNOSIS — H2510 Age-related nuclear cataract, unspecified eye: Secondary | ICD-10-CM | POA: Diagnosis not present

## 2012-05-13 DIAGNOSIS — E119 Type 2 diabetes mellitus without complications: Secondary | ICD-10-CM | POA: Diagnosis not present

## 2012-05-13 DIAGNOSIS — H35039 Hypertensive retinopathy, unspecified eye: Secondary | ICD-10-CM | POA: Diagnosis not present

## 2012-05-13 LAB — HM DIABETES EYE EXAM

## 2012-05-14 ENCOUNTER — Encounter: Payer: Self-pay | Admitting: Internal Medicine

## 2012-06-03 ENCOUNTER — Other Ambulatory Visit: Payer: Self-pay | Admitting: Internal Medicine

## 2012-06-05 ENCOUNTER — Ambulatory Visit (INDEPENDENT_AMBULATORY_CARE_PROVIDER_SITE_OTHER)
Admission: RE | Admit: 2012-06-05 | Discharge: 2012-06-05 | Disposition: A | Discharge status: Home / Self Care | Payer: Medicare Other | Source: Ambulatory Visit | Attending: Internal Medicine | Admitting: Internal Medicine

## 2012-06-05 ENCOUNTER — Ambulatory Visit (INDEPENDENT_AMBULATORY_CARE_PROVIDER_SITE_OTHER): Payer: Medicare Other | Admitting: Internal Medicine

## 2012-06-05 ENCOUNTER — Encounter: Payer: Self-pay | Admitting: Internal Medicine

## 2012-06-05 VITALS — BP 132/86 | HR 100 | Temp 97.9°F | Wt 146.0 lb

## 2012-06-05 DIAGNOSIS — L52 Erythema nodosum: Secondary | ICD-10-CM

## 2012-06-05 DIAGNOSIS — R5381 Other malaise: Secondary | ICD-10-CM

## 2012-06-05 DIAGNOSIS — Z111 Encounter for screening for respiratory tuberculosis: Secondary | ICD-10-CM | POA: Diagnosis not present

## 2012-06-05 DIAGNOSIS — K219 Gastro-esophageal reflux disease without esophagitis: Secondary | ICD-10-CM

## 2012-06-05 DIAGNOSIS — R5383 Other fatigue: Secondary | ICD-10-CM

## 2012-06-05 DIAGNOSIS — R0602 Shortness of breath: Secondary | ICD-10-CM | POA: Diagnosis not present

## 2012-06-05 LAB — POCT URINALYSIS DIP (MANUAL ENTRY)
Blood, UA: NEGATIVE
Ketones, POC UA: NEGATIVE
Protein Ur, POC: NEGATIVE
Spec Grav, UA: 1.01
pH, UA: 7

## 2012-06-05 LAB — BASIC METABOLIC PANEL
Calcium: 10.4 mg/dL (ref 8.4–10.5)
Creatinine, Ser: 1.1 mg/dL (ref 0.4–1.2)
GFR: 53.9 mL/min — ABNORMAL LOW (ref >60.00)

## 2012-06-05 LAB — CBC WITH DIFFERENTIAL/PLATELET
Basophils Relative: 0.5 % (ref 0.0–3.0)
HCT: 39.8 % (ref 36.0–46.0)
Hemoglobin: 13.5 g/dL (ref 12.0–15.0)
Lymphocytes Relative: 17.7 % (ref 12.0–46.0)
MCHC: 34 g/dL (ref 30.0–36.0)
Monocytes Relative: 7.5 % (ref 3.0–12.0)
Neutro Abs: 6.1 10*3/uL (ref 1.4–7.7)
RBC: 4.74 Mil/uL (ref 3.87–5.11)

## 2012-06-05 LAB — HEPATIC FUNCTION PANEL
AST: 16 U/L (ref 0–37)
Albumin: 3.9 g/dL (ref 3.5–5.2)
Alkaline Phosphatase: 56 U/L (ref 39–117)
Total Protein: 7.5 g/dL (ref 6.0–8.3)

## 2012-06-05 NOTE — Progress Notes (Signed)
Chief Complaint  Patient presents with  . Bleeding/Bruising    Believes this to be from the Protonix.  Would like to discuss changing medications.    HPI: Patient comes in today for SDA for  new problem evaluation. Last visit changed to gen protonix because insurance wouldn't cover dexilant . In february    Week before mothers day almost 2 weeks ago developed some spots on her legs and then abdomen like her in nodosum. She's had these symptoms in the spring before at one point thought it was Zantac caused it  long time ago.  4 unfortunately the Protonix was really helping her reflux.  She describes them as tender and feverish and she feels achy. No treatment but she did stop the medicine  Stopped the med   2 weeks .    agoo ? If fading or not.   Did more last night  And felt worse  Spots got worse and warmth and  Not feeling weel.. feels tired no cough possibly shortness of breath no preceding symptoms of infection such as strep throat UTI or fevers. She does have some chronic allergy symptoms.   States in the remote past she had some skin lesions that were biopsied and said they were E. nodosum but she hasn't really had some allover her body like this. They don't really itch  ROS: See pertinent positives and negatives per HPI.  Past Medical History  Diagnosis Date  . Allergy   . GERD (gastroesophageal reflux disease)     History of stricture and dilatation and Nissen surgery  . Hypertension   . Mood disorder   . Erythema nodosum     Tends to be seasonal has had a negative chest x-ray in the past negative PPD  . Diverticulosis of colon   . Meniere's disease     ? dx per dr Marcelline Mates    Family History  Problem Relation Age of Onset  . Cancer Mother   . Depression Mother   . Depression Father   . COPD Sister     Deceased 12/01/2009  . Nephrolithiasis      History   Social History  . Marital Status: Married    Spouse Name: N/A    Number of Children: N/A  . Years of  Education: N/A   Social History Main Topics  . Smoking status: Never Smoker   . Smokeless tobacco: Never Used  . Alcohol Use: No  . Drug Use: No  . Sexually Active: None   Other Topics Concern  . None   Social History Narrative   Unemployed   Married   [place] of 2    Has grandchildren out of state   No pets   Sis. died 12/01/09 COPD    Outpatient Encounter Prescriptions as of 06/05/2012  Medication Sig Dispense Refill  . cholecalciferol (VITAMIN D) 1000 UNITS tablet Take 2,000 Units by mouth daily.       . fexofenadine-pseudoephedrine (ALLEGRA-D 24) 180-240 MG per 24 hr tablet Take 1 tablet by mouth daily.        [name] glucose blood (FREESTYLE LITE) test strip Test daily.  100 each  11  . Lancets (FREESTYLE) lancets Use daily.  100 each  4  . minoxidil (ROGAINE) 2 % external solution Apply topically 2 (two) times daily.        . potassium chloride (KLOR-CON M10) 10 MEQ tablet       . Probiotic Product (PROBIOTIC PO) Take 2 tablets by  mouth daily.        [name] triamterene-hydrochlorothiazide (MAXZIDE-25) 37.5-25 MG per tablet TAKE ONE-HALF TABLET BY MOUTH EVERY DAY  45 tablet  11  . vitamin B-12 (CYANOCOBALAMIN) 100 MCG tablet Take 500 mcg by mouth daily.       . [DISCONTINUED] KLOR-CON M10 10 MEQ tablet TAKE ONE TABLET BY MOUTH DAILY.TAKE 1 TABLET EVERY OTHER DAY WITH FLUID PILL  30 each  5  . [DISCONTINUED] pantoprazole (PROTONIX) 40 MG tablet Take 1 tablet (40 mg total) by mouth daily.  30 tablet  11   No facility-administered encounter medications on file as of 06/05/2012.    EXAM:  BP 132/86  Pulse 100  Temp(Src) 97.9 F (36.6 C) (Oral)  Wt 146 lb (66.225 kg)  BMI 28.74 kg/m2  SpO2 97%  Body mass index is 28.74 kg/(m^2).  GENERAL: vitals reviewed and listed above, alert, oriented, appears well hydrated and in no acute distress HEENT: atraumatic, conjunctiva  clear, no obvious abnormalities on inspection of external nose and ears OP : no lesion edema or exudate  NECK: no  obvious masses on inspection palpation no adenopathy LUNGS: clear to auscultation bilaterally, no wheezes, rales or rhonchi, good air movement CV: HRRR, no clubbing cyanosis or  peripheral edema nl cap refill  Skin:  She does have a nodular pink to dusky looking lesions about 3 cm or more some on the pretibial area by and right abdomen above the panty line none on face or extremities. There is no target lesion vesicle no streaking no acute joint swelling. No redness or warmth otherwise.  No vesicles cold sores. MS: moves all extremities without noticeable focal  abnormality PSYCH: pleasant and cooperative,   ASSESSMENT AND PLAN:  Discussed the following assessment and plan:  Erythema nodosum - These look like he nodosum but in an atypical distribution history of same under different circumstances - Plan: potassium chloride (KLOR-CON M10) 10 MEQ tablet, DG Chest 2 View, CBC with Differential, Hepatic function panel, Basic metabolic panel, Antistreptolysin O titer, Anti-DNAse B antibody, Sedimentation rate, ANA, POCT urinalysis dipstick  Screening-pulmonary TB - Plan: TB Skin Test I am not certain of the diagnosis but agree with stopping her previous medication and doing screening blood work pulmonary evaluation to rule out primary infection. Would also like her dermatologist to see these lesions for diagnosis opinion. The initial dermatologist from years ago who biopsied a similar lesion is now retired. No real symptoms of inflammatory bowel disease. She does have fibromyalgia and joint aches at times but no acute arthritis. These don't look like e multiforme in today and has no mucosal symptoms. Would like to be able to use a protonix  and if not we'll have to figure something out for the reflux that she has.  -Patient advised to return or notify health care team  if symptoms worsen or persist or new concerns arise.  Patient Instructions  This could be a med reaction or infection  but not  sure.  Lesions look more likje e nodosum insetad of multiforme but  i agree they are spread in more locations than ususal.  We need to check out for possible causes . Agree staying off new meds .for now.  Labs tb tests and chest x ray    May need to get dermatology to see you  For confirmation of dx and  Management  (   Esp If  persistent or progressive ) Plan follpow up after labs back and progress    Mariann Laster  Darnelle Going M.D.  (She sees Dr. Ronnald Ramp at [facility] dermatology but she's not sure which one. )

## 2012-06-05 NOTE — Patient Instructions (Addendum)
This could be a med reaction or infection  but not sure.  Lesions look more likje e nodosum insetad of multiforme but  i agree they are spread in more locations than ususal.  We need to check out for possible causes . Agree staying off new meds .for now.  Labs tb tests and chest x ray    May need to get dermatology to see you  For confirmation of dx and  Management  (   Esp If  persistent or progressive ) Plan follpow up after labs back and progress

## 2012-06-07 LAB — TB SKIN TEST: TB Skin Test: NEGATIVE

## 2012-06-07 LAB — ANTISTREPTOLYSIN O TITER: ASO: <25 IU/mL (ref <409)

## 2012-06-09 LAB — ANTI-DNASE B ANTIBODY

## 2012-06-11 ENCOUNTER — Encounter: Payer: Self-pay | Admitting: Internal Medicine

## 2012-06-12 ENCOUNTER — Telehealth: Payer: Self-pay | Admitting: Family Medicine

## 2012-06-12 NOTE — Telephone Encounter (Signed)
Left message on work phone (believe this to be a cell).  Unable to leave message on home.  No machine available.

## 2012-06-12 NOTE — Telephone Encounter (Signed)
Message copied by Nils Flack on Wed Jun 12, 2012  9:09 AM ------      Message from: [facility] [place] [facility]      Created: Wed Jun 05, 2012  7:13 PM      Regarding: derm appt needed       Caitlin [name]            Please call or get someone to call [facility] dermatology and see if someone can see [name] within the next few days.( mon is a holiday). She sees Dr Yetta Barre (doesn't know which one. )            Her rash  is unusual associated with an elevated sedimentation rate I will calling it e nodosum but is atypical.            I will talk with them if needed.  To get her in.       Thanks       WP             ------

## 2012-06-13 NOTE — Telephone Encounter (Signed)
Spoke to the pt.  Her schedule was very limited.  Able to get her an appt on July 15, 2012 to see Dr. Bufford Buttner.  Patient never called to get an appt.

## 2012-06-17 ENCOUNTER — Ambulatory Visit
Admission: RE | Admit: 2012-06-17 | Discharge: 2012-06-17 | Disposition: A | Discharge status: Home / Self Care | Payer: Medicare Other | Source: Ambulatory Visit

## 2012-06-17 DIAGNOSIS — Z1231 Encounter for screening mammogram for malignant neoplasm of breast: Secondary | ICD-10-CM | POA: Diagnosis not present

## 2012-07-15 DIAGNOSIS — L52 Erythema nodosum: Secondary | ICD-10-CM | POA: Diagnosis not present

## 2012-07-15 DIAGNOSIS — L819 Disorder of pigmentation, unspecified: Secondary | ICD-10-CM | POA: Diagnosis not present

## 2012-07-15 DIAGNOSIS — L280 Lichen simplex chronicus: Secondary | ICD-10-CM | POA: Diagnosis not present

## 2012-07-15 DIAGNOSIS — L570 Actinic keratosis: Secondary | ICD-10-CM | POA: Diagnosis not present

## 2012-07-15 DIAGNOSIS — D2360 Other benign neoplasm of skin of unspecified upper limb, including shoulder: Secondary | ICD-10-CM | POA: Diagnosis not present

## 2012-07-15 DIAGNOSIS — D1801 Hemangioma of skin and subcutaneous tissue: Secondary | ICD-10-CM | POA: Diagnosis not present

## 2012-07-15 DIAGNOSIS — D235 Other benign neoplasm of skin of trunk: Secondary | ICD-10-CM | POA: Diagnosis not present

## 2012-07-15 DIAGNOSIS — L821 Other seborrheic keratosis: Secondary | ICD-10-CM | POA: Diagnosis not present

## 2012-07-22 ENCOUNTER — Other Ambulatory Visit: Payer: Self-pay | Admitting: Internal Medicine

## 2012-08-21 ENCOUNTER — Other Ambulatory Visit: Payer: Self-pay

## 2012-09-08 ENCOUNTER — Other Ambulatory Visit: Payer: Self-pay | Admitting: Internal Medicine

## 2012-09-11 ENCOUNTER — Other Ambulatory Visit: Payer: Self-pay | Admitting: Family Medicine

## 2012-09-11 MED ORDER — POTASSIUM CHLORIDE CRYS ER 10 MEQ PO TBCR: EXTENDED_RELEASE_TABLET | ORAL | Status: DC

## 2012-10-11 ENCOUNTER — Ambulatory Visit (INDEPENDENT_AMBULATORY_CARE_PROVIDER_SITE_OTHER): Payer: Medicare Other

## 2012-10-11 DIAGNOSIS — Z23 Encounter for immunization: Secondary | ICD-10-CM | POA: Diagnosis not present

## 2012-11-05 ENCOUNTER — Ambulatory Visit (INDEPENDENT_AMBULATORY_CARE_PROVIDER_SITE_OTHER): Payer: Medicare Other | Admitting: Family Medicine

## 2012-11-05 DIAGNOSIS — Z23 Encounter for immunization: Secondary | ICD-10-CM

## 2012-11-08 ENCOUNTER — Ambulatory Visit: Payer: Medicare Other | Admitting: Family Medicine

## 2013-03-05 ENCOUNTER — Ambulatory Visit (INDEPENDENT_AMBULATORY_CARE_PROVIDER_SITE_OTHER): Payer: Medicare Other | Admitting: Family Medicine

## 2013-03-05 ENCOUNTER — Encounter: Payer: Self-pay | Admitting: Family Medicine

## 2013-03-05 VITALS — BP 130/72 | HR 100 | Temp 98.8°F | Ht 59.75 in | Wt 150.0 lb

## 2013-03-05 DIAGNOSIS — J209 Acute bronchitis, unspecified: Secondary | ICD-10-CM | POA: Diagnosis not present

## 2013-03-05 MED ORDER — AZITHROMYCIN 250 MG PO TABS: ORAL_TABLET | ORAL | Status: DC

## 2013-03-05 NOTE — Progress Notes (Signed)
   Subjective:    Patient ID: Caitlin [name], female    DOB: 1945-04-26, 68 y.o.   MRN: [id number]  HPI Here for 3 days of PND, chest tightness and coughing up yellow sputum. No fever.    Review of Systems  Constitutional: Negative.   HENT: Positive for congestion and postnasal drip.   Eyes: Negative.   Respiratory: Positive for cough.        Objective:   Physical Exam  Constitutional: She appears well-developed and well-nourished.  HENT:  Right Ear: External ear normal.  Left Ear: External ear normal.  Nose: Nose normal.  Mouth/Throat: Oropharynx is clear and moist.  Eyes: Conjunctivae are normal.  Pulmonary/Chest: Effort normal. No respiratory distress. She has no wheezes. She has no rales.  Scattered rhonchi   Lymphadenopathy:    She has no cervical adenopathy.          Assessment & Plan:  Add Mucinex and Delsym

## 2013-03-05 NOTE — Progress Notes (Signed)
Pre visit review using our clinic review tool, if applicable. No additional management support is needed unless otherwise documented below in the visit note. 

## 2013-03-07 ENCOUNTER — Encounter: Payer: Self-pay | Admitting: Internal Medicine

## 2013-03-10 ENCOUNTER — Telehealth: Payer: Self-pay | Admitting: Internal Medicine

## 2013-03-10 NOTE — Telephone Encounter (Signed)
Pt states she saw dr.fry and he informed her that she had to vaccines one in 2010 and the other in 2014 pt would like to know what vaccines they were.

## 2013-03-10 NOTE — Telephone Encounter (Signed)
Pt would like to know if she has or needs pneumonia vac. Pt sent a message on my chart  and states  dr fry told her she already has 2 injections , and pt would like to know what kind they were.

## 2013-03-10 NOTE — Telephone Encounter (Signed)
Sent the reply back through [facility] with original inquiry.

## 2013-03-18 ENCOUNTER — Other Ambulatory Visit: Payer: Self-pay | Admitting: Internal Medicine

## 2013-04-03 ENCOUNTER — Encounter: Payer: Self-pay | Admitting: Internal Medicine

## 2013-05-19 ENCOUNTER — Other Ambulatory Visit: Payer: Self-pay

## 2013-05-19 DIAGNOSIS — Z1231 Encounter for screening mammogram for malignant neoplasm of breast: Secondary | ICD-10-CM

## 2013-06-10 DIAGNOSIS — M7750 Other enthesopathy of unspecified foot: Secondary | ICD-10-CM | POA: Diagnosis not present

## 2013-06-10 DIAGNOSIS — M722 Plantar fascial fibromatosis: Secondary | ICD-10-CM | POA: Diagnosis not present

## 2013-06-23 ENCOUNTER — Ambulatory Visit
Admission: RE | Admit: 2013-06-23 | Discharge: 2013-06-23 | Disposition: A | Discharge status: Home / Self Care | Payer: Medicare Other | Source: Ambulatory Visit

## 2013-06-23 DIAGNOSIS — Z1231 Encounter for screening mammogram for malignant neoplasm of breast: Secondary | ICD-10-CM

## 2013-07-08 DIAGNOSIS — M722 Plantar fascial fibromatosis: Secondary | ICD-10-CM | POA: Diagnosis not present

## 2013-07-16 ENCOUNTER — Other Ambulatory Visit: Payer: Self-pay | Admitting: Internal Medicine

## 2013-07-16 ENCOUNTER — Other Ambulatory Visit: Payer: Self-pay

## 2013-07-16 DIAGNOSIS — L821 Other seborrheic keratosis: Secondary | ICD-10-CM | POA: Diagnosis not present

## 2013-07-16 DIAGNOSIS — D239 Other benign neoplasm of skin, unspecified: Secondary | ICD-10-CM | POA: Diagnosis not present

## 2013-07-16 DIAGNOSIS — D1801 Hemangioma of skin and subcutaneous tissue: Secondary | ICD-10-CM | POA: Diagnosis not present

## 2013-07-16 DIAGNOSIS — D235 Other benign neoplasm of skin of trunk: Secondary | ICD-10-CM | POA: Diagnosis not present

## 2013-07-16 DIAGNOSIS — L819 Disorder of pigmentation, unspecified: Secondary | ICD-10-CM | POA: Diagnosis not present

## 2013-07-16 DIAGNOSIS — L280 Lichen simplex chronicus: Secondary | ICD-10-CM | POA: Diagnosis not present

## 2013-07-16 DIAGNOSIS — D485 Neoplasm of uncertain behavior of skin: Secondary | ICD-10-CM | POA: Diagnosis not present

## 2013-07-16 NOTE — Telephone Encounter (Signed)
#  15 Sent by e-scribe.  Needs OV.

## 2013-07-18 DIAGNOSIS — M19079 Primary osteoarthritis, unspecified ankle and foot: Secondary | ICD-10-CM | POA: Diagnosis not present

## 2013-08-01 ENCOUNTER — Telehealth: Payer: Self-pay | Admitting: Internal Medicine

## 2013-08-01 ENCOUNTER — Other Ambulatory Visit: Payer: Self-pay | Admitting: Internal Medicine

## 2013-08-01 NOTE — Telephone Encounter (Signed)
Sent to the pharmacy by e-scribe.  Pt has upcoming appt on 08/12/13 with WP.

## 2013-08-01 NOTE — Telephone Encounter (Signed)
Sent to the pharmacy by e-scribe. 

## 2013-08-01 NOTE — Telephone Encounter (Signed)
Pt is going out of town and needs freestyle test strips today. Pt is on prednisone and checking her BS more. [facility] [address]

## 2013-08-12 ENCOUNTER — Ambulatory Visit (INDEPENDENT_AMBULATORY_CARE_PROVIDER_SITE_OTHER): Payer: Medicare Other | Admitting: Internal Medicine

## 2013-08-12 ENCOUNTER — Encounter: Payer: Self-pay | Admitting: Internal Medicine

## 2013-08-12 VITALS — BP 142/82 | Temp 98.5°F | Ht 59.5 in | Wt 149.0 lb

## 2013-08-12 DIAGNOSIS — Z79899 Other long term (current) drug therapy: Secondary | ICD-10-CM

## 2013-08-12 DIAGNOSIS — R7309 Other abnormal glucose: Secondary | ICD-10-CM | POA: Diagnosis not present

## 2013-08-12 DIAGNOSIS — I10 Essential (primary) hypertension: Secondary | ICD-10-CM | POA: Diagnosis not present

## 2013-08-12 DIAGNOSIS — K219 Gastro-esophageal reflux disease without esophagitis: Secondary | ICD-10-CM | POA: Diagnosis not present

## 2013-08-12 DIAGNOSIS — L659 Nonscarring hair loss, unspecified: Secondary | ICD-10-CM

## 2013-08-12 DIAGNOSIS — E7800 Pure hypercholesterolemia, unspecified: Secondary | ICD-10-CM

## 2013-08-12 LAB — HEPATIC FUNCTION PANEL
ALK PHOS: 54 U/L (ref 39–117)
ALT: 26 U/L (ref 0–35)
AST: 19 U/L (ref 0–37)
Albumin: 3.9 g/dL (ref 3.5–5.2)
BILIRUBIN DIRECT: 0.1 mg/dL (ref 0.0–0.3)
BILIRUBIN TOTAL: 0.7 mg/dL (ref 0.2–1.2)
Total Protein: 6.8 g/dL (ref 6.0–8.3)

## 2013-08-12 LAB — CBC WITH DIFFERENTIAL/PLATELET
Basophils Absolute: 0 10*3/uL (ref 0.0–0.1)
Basophils Relative: 0.5 % (ref 0.0–3.0)
EOS ABS: 0.2 10*3/uL (ref 0.0–0.7)
EOS PCT: 2.2 % (ref 0.0–5.0)
HEMATOCRIT: 43 % (ref 36.0–46.0)
Hemoglobin: 14.2 g/dL (ref 12.0–15.0)
LYMPHS ABS: 1.8 10*3/uL (ref 0.7–4.0)
Lymphocytes Relative: 17.8 % (ref 12.0–46.0)
MCHC: 33 g/dL (ref 30.0–36.0)
MCV: 85.6 fl (ref 78.0–100.0)
MONO ABS: 0.6 10*3/uL (ref 0.1–1.0)
Monocytes Relative: 5.6 % (ref 3.0–12.0)
Neutro Abs: 7.6 10*3/uL (ref 1.4–7.7)
Neutrophils Relative %: 73.9 % (ref 43.0–77.0)
PLATELETS: 333 10*3/uL (ref 150.0–400.0)
RBC: 5.03 Mil/uL (ref 3.87–5.11)
RDW: 14.3 % (ref 11.5–15.5)
WBC: 10.3 10*3/uL (ref 4.0–10.5)

## 2013-08-12 LAB — TSH: TSH: 1.27 u[IU]/mL (ref 0.35–4.50)

## 2013-08-12 LAB — HEMOGLOBIN A1C: Hgb A1c MFr Bld: 6.3 % (ref 4.6–6.5)

## 2013-08-12 LAB — BASIC METABOLIC PANEL
BUN: 12 mg/dL (ref 6–23)
CHLORIDE: 102 meq/L (ref 96–112)
CO2: 32 meq/L (ref 19–32)
Calcium: 10.3 mg/dL (ref 8.4–10.5)
Creatinine, Ser: 0.9 mg/dL (ref 0.4–1.2)
GFR: 64.63 mL/min (ref >60.00)
GLUCOSE: 92 mg/dL (ref 70–99)
POTASSIUM: 4.1 meq/L (ref 3.5–5.1)
SODIUM: 139 meq/L (ref 135–145)

## 2013-08-12 LAB — MAGNESIUM: MAGNESIUM: 1.9 mg/dL (ref 1.5–2.5)

## 2013-08-12 LAB — LIPID PANEL
CHOLESTEROL: 174 mg/dL (ref 0–200)
HDL: 77.2 mg/dL (ref >39.00)
LDL CALC: 71 mg/dL (ref 0–99)
NonHDL: 96.8
Total CHOL/HDL Ratio: 2
Triglycerides: 131 mg/dL (ref 0.0–149.0)
VLDL: 26.2 mg/dL (ref 0.0–40.0)

## 2013-08-12 LAB — T4, FREE: FREE T4: 0.8 ng/dL (ref 0.60–1.60)

## 2013-08-12 MED ORDER — POTASSIUM CHLORIDE CRYS ER 10 MEQ PO TBCR: EXTENDED_RELEASE_TABLET | ORAL | Status: DC

## 2013-08-12 NOTE — Progress Notes (Signed)
Pre visit review using our clinic review tool, if applicable. No additional management support is needed unless otherwise documented below in the visit note.  Chief Complaint  Patient presents with  . Follow-up    Med Check    HPI: Fu medication  Review labs etc  Last visit with me was 5 14 for e nodosum and last pv  4 14   Gi same acid reflux   When lays down .  Take  Potassium in am qod   And due for this running out   Taking 1/2 pill diuretic  For ear and bp   Some cramps  In feet.at times    Problem with right foot  Inflammation  Given   Prednisone   Dr Noemi Chapel    Right foot prednisone and shot   Under rx  . [name] Problem for months  To go back in a few weeks  For months   Checking BGs  Up in pred to 133 fasting but down to 100 range  Now off  Had some concerns about this  bp has been good elsewhere and out of office   Loss of eye brows to check thyroid  Per derm haad skin lesion removal noin cancer  ROS: See pertinent positives and negatives per HPI. No fever chills weight loss   Past Medical History  Diagnosis Date  . Allergy   . GERD (gastroesophageal reflux disease)     History of stricture and dilatation and Nissen surgery  . Hypertension   . Mood disorder   . Erythema nodosum     Tends to be seasonal has had a negative chest x-ray in the past negative PPD  . Diverticulosis of colon   . Meniere's disease     ? dx per dr Marcelline Mates    Family History  Problem Relation Age of Onset  . Cancer Mother   . Depression Mother   . Depression Father   . COPD Sister     Deceased 2009/11/17  . Nephrolithiasis      History   Social History  . Marital Status: Married    Spouse Name: N/A    Number of Children: N/A  . Years of Education: N/A   Social History Main Topics  . Smoking status: Never Smoker   . Smokeless tobacco: Never Used  . Alcohol Use: No  . Drug Use: No  . Sexual Activity: None   Other Topics Concern  . None   Social History Narrative   Unemployed   Married   [place] of 2    Has grandchildren out of state   No pets   Sis. died 2009-11-17 COPD    Outpatient Encounter Prescriptions as of 08/12/2013  Medication Sig  . cholecalciferol (VITAMIN D) 1000 UNITS tablet Take 2,000 Units by mouth daily.   . fexofenadine-pseudoephedrine (ALLEGRA-D 24) 180-240 MG per 24 hr tablet Take 1 tablet by mouth daily.    [name] FREESTYLE LITE test strip TEST DAILY  . Lancets (FREESTYLE) lancets USE DAILY.  . minoxidil (ROGAINE) 2 % external solution Apply topically 2 (two) times daily.    . potassium chloride (KLOR-CON M10) 10 MEQ tablet TAKE ONE TABLET BY MOUTH EVERY OTHER DAY WITH FLUID TABLET  . Probiotic Product (PROBIOTIC PO) Take 2 tablets by mouth daily.    [name] triamterene-hydrochlorothiazide (MAXZIDE-25) 37.5-25 MG per tablet TAKE ONE-HALF TABLET BY MOUTH EVERY DAY  . vitamin B-12 (CYANOCOBALAMIN) 100 MCG tablet Take 500 mcg by mouth daily.   . [  DISCONTINUED] KLOR-CON M10 10 MEQ tablet TAKE ONE TABLET BY MOUTH EVERY OTHER DAY WITH FLUID TABLET  . [DISCONTINUED] azithromycin (ZITHROMAX) 250 MG tablet As directed    EXAM:  BP 142/82  Temp(Src) 98.5 F (36.9 C) (Oral)  Ht 4' 11.5" (1.511 m)  Wt 149 lb (67.586 kg)  BMI 29.60 kg/m2  Body mass index is 29.6 kg/(m^2).  GENERAL: vitals reviewed and listed above, alert, oriented, appears well hydrated and in no acute distress HEENT: atraumatic, conjunctiva  clear, no obvious abnormalities on inspection of external nose and ears NECK: no obvious masses on inspection palpation  LUNGS: clear to auscultation bilaterally, no wheezes, rales or rhonchi, good air movement Abdomen:  Sof,t normal bowel sounds without hepatosplenomegaly, no guarding rebound or masses no CVA tenderness CV: HRRR, no clubbing cyanosis or  peripheral edema nl cap refill  Skin no eye brows ( uses rogaine on scalp) MS: moves all extremities without noticeable focal  Abnormality Mild limp right foot no redness noted  PSYCH:  pleasant and cooperative, brighter affect than in past  Lab Results  Component Value Date   WBC 10.3 08/12/2013   HGB 14.2 08/12/2013   HCT 43.0 08/12/2013   PLT 333.0 08/12/2013   GLUCOSE 92 08/12/2013   CHOL 174 08/12/2013   TRIG 131.0 08/12/2013   HDL 77.20 08/12/2013   LDLCALC 71 08/12/2013   ALT 26 08/12/2013   AST 19 08/12/2013   NA 139 08/12/2013   K 4.1 08/12/2013   CL 102 08/12/2013   CREATININE 0.9 08/12/2013   BUN 12 08/12/2013   CO2 32 08/12/2013   TSH 1.27 08/12/2013   HGBA1C 6.3 08/12/2013   MICROALBUR 1.6 04/26/2011    ASSESSMENT AND PLAN:  Discussed the following assessment and plan:  HYPERTENSION - monitor med potassium on diuretic also for ear sx   checkk today - Plan: Basic metabolic panel, Hemoglobin A1c, TSH, Hepatic function panel, CBC with Differential, Lipid panel, T4, free, Magnesium  HYPERGLYCEMIA - elevaetd on pred better now check a1c  - Plan: Basic metabolic panel, Hemoglobin A1c, TSH, Hepatic function panel, CBC with Differential, Lipid panel, T4, free, Magnesium  Elevated cholesterol - Plan: Basic metabolic panel, Hemoglobin A1c, TSH, Hepatic function panel, CBC with Differential, Lipid panel, T4, free, Magnesium  Gastroesophageal reflux disease, esophagitis presence not specified - contniue ing problematic  when lays down  no new alarm features - Plan: Basic metabolic panel, Hemoglobin A1c, TSH, Hepatic function panel, CBC with Differential, Lipid panel, T4, free, Magnesium  Medication management - Plan: Basic metabolic panel, Hemoglobin A1c, TSH, Hepatic function panel, CBC with Differential, Lipid panel, T4, free, Magnesium  Hair loss - using rogaine and eye brows now gone chedk tsh - Plan: Basic metabolic panel, Hemoglobin A1c, TSH, Hepatic function panel, CBC with Differential, Lipid panel, T4, free, Magnesium Need lab monitoring for all of above and r/o thyroid  -Patient advised to return or notify health care team  if symptoms worsen ,persist or new concerns  arise.  Patient Instructions  Will notify you  of labs when available. Then plan fu depending on results .  Get dr Noemi Chapel   Office to send copy of notes to [place].    Standley Brooking. Panosh M.D.

## 2013-08-12 NOTE — Patient Instructions (Addendum)
Will notify you  of labs when available. Then plan fu depending on results .  Get dr Noemi Chapel   Office to send copy of notes to [place].

## 2013-08-18 ENCOUNTER — Telehealth: Payer: Self-pay | Admitting: Internal Medicine

## 2013-08-18 NOTE — Telephone Encounter (Signed)
Pt returning your call. pls call on cell phone.

## 2013-08-18 NOTE — Telephone Encounter (Signed)
Spoke to the pt and gave her lab results by telephone.

## 2013-08-26 ENCOUNTER — Other Ambulatory Visit: Payer: Self-pay | Admitting: Internal Medicine

## 2013-08-26 NOTE — Telephone Encounter (Signed)
Sent to the pharmacy by e-scribe. 

## 2013-08-28 ENCOUNTER — Telehealth: Payer: Self-pay | Admitting: Internal Medicine

## 2013-08-28 DIAGNOSIS — M7750 Other enthesopathy of unspecified foot: Secondary | ICD-10-CM | POA: Diagnosis not present

## 2013-08-28 NOTE — Telephone Encounter (Signed)
Pt would like to know why her bp meds only has 1 refill, pt picked up her meds today and the pharmacy informed her it was only 1 refill on it. Pt states she was here on 7/28 for a med check.

## 2013-08-28 NOTE — Telephone Encounter (Signed)
Please notify the pt that Dr. Regis Bill is not in the office and I did not want her to run out of her medication.

## 2013-09-02 NOTE — Telephone Encounter (Signed)
Made pt aware

## 2013-10-14 ENCOUNTER — Ambulatory Visit (INDEPENDENT_AMBULATORY_CARE_PROVIDER_SITE_OTHER): Payer: Medicare Other | Admitting: Family Medicine

## 2013-10-14 DIAGNOSIS — Z23 Encounter for immunization: Secondary | ICD-10-CM | POA: Diagnosis not present

## 2013-10-21 ENCOUNTER — Other Ambulatory Visit: Payer: Self-pay | Admitting: Internal Medicine

## 2013-10-21 NOTE — Telephone Encounter (Signed)
Sent to the pharmacy by e-scribe. 

## 2014-02-03 ENCOUNTER — Other Ambulatory Visit: Payer: Self-pay | Admitting: Internal Medicine

## 2014-02-04 NOTE — Telephone Encounter (Signed)
Sent to the pharmacy by e-scribe. 

## 2014-05-20 ENCOUNTER — Other Ambulatory Visit: Payer: Self-pay

## 2014-05-20 DIAGNOSIS — Z1231 Encounter for screening mammogram for malignant neoplasm of breast: Secondary | ICD-10-CM

## 2014-06-29 ENCOUNTER — Ambulatory Visit
Admission: RE | Admit: 2014-06-29 | Discharge: 2014-06-29 | Disposition: A | Discharge status: Home / Self Care | Payer: Medicare Other | Source: Ambulatory Visit

## 2014-06-29 DIAGNOSIS — Z1231 Encounter for screening mammogram for malignant neoplasm of breast: Secondary | ICD-10-CM | POA: Diagnosis not present

## 2014-07-10 ENCOUNTER — Encounter: Payer: Self-pay | Admitting: Internal Medicine

## 2014-07-10 ENCOUNTER — Ambulatory Visit (INDEPENDENT_AMBULATORY_CARE_PROVIDER_SITE_OTHER): Payer: Medicare Other | Admitting: Internal Medicine

## 2014-07-10 VITALS — BP 130/82 | Temp 98.1°F | Ht 59.5 in | Wt 147.3 lb

## 2014-07-10 DIAGNOSIS — R5383 Other fatigue: Secondary | ICD-10-CM

## 2014-07-10 DIAGNOSIS — Z820 Family history of epilepsy and other diseases of the nervous system: Secondary | ICD-10-CM

## 2014-07-10 DIAGNOSIS — Z79899 Other long term (current) drug therapy: Secondary | ICD-10-CM

## 2014-07-10 DIAGNOSIS — R030 Elevated blood-pressure reading, without diagnosis of hypertension: Secondary | ICD-10-CM

## 2014-07-10 DIAGNOSIS — Z818 Family history of other mental and behavioral disorders: Secondary | ICD-10-CM

## 2014-07-10 DIAGNOSIS — R413 Other amnesia: Secondary | ICD-10-CM | POA: Diagnosis not present

## 2014-07-10 DIAGNOSIS — K219 Gastro-esophageal reflux disease without esophagitis: Secondary | ICD-10-CM

## 2014-07-10 DIAGNOSIS — R7301 Impaired fasting glucose: Secondary | ICD-10-CM | POA: Diagnosis not present

## 2014-07-10 DIAGNOSIS — F329 Major depressive disorder, single episode, unspecified: Secondary | ICD-10-CM | POA: Diagnosis not present

## 2014-07-10 DIAGNOSIS — F32A Depression, unspecified: Secondary | ICD-10-CM

## 2014-07-10 DIAGNOSIS — R252 Cramp and spasm: Secondary | ICD-10-CM

## 2014-07-10 DIAGNOSIS — IMO0001 Reserved for inherently not codable concepts without codable children: Secondary | ICD-10-CM

## 2014-07-10 LAB — HEPATIC FUNCTION PANEL
ALK PHOS: 65 U/L (ref 39–117)
ALT: 14 U/L (ref 0–35)
AST: 16 U/L (ref 0–37)
Albumin: 4.5 g/dL (ref 3.5–5.2)
BILIRUBIN DIRECT: 0 mg/dL (ref 0.0–0.3)
BILIRUBIN TOTAL: 0.6 mg/dL (ref 0.2–1.2)
TOTAL PROTEIN: 7.3 g/dL (ref 6.0–8.3)

## 2014-07-10 LAB — BASIC METABOLIC PANEL
BUN: 17 mg/dL (ref 6–23)
CALCIUM: 10.3 mg/dL (ref 8.4–10.5)
CO2: 30 mEq/L (ref 19–32)
Chloride: 100 mEq/L (ref 96–112)
Creatinine, Ser: 1.11 mg/dL (ref 0.40–1.20)
GFR: 51.9 mL/min — AB (ref >60.00)
GLUCOSE: 98 mg/dL (ref 70–99)
Potassium: 4.4 mEq/L (ref 3.5–5.1)
SODIUM: 140 meq/L (ref 135–145)

## 2014-07-10 LAB — CBC WITH DIFFERENTIAL/PLATELET
BASOS PCT: 0.9 % (ref 0.0–3.0)
Basophils Absolute: 0.1 10*3/uL (ref 0.0–0.1)
EOS ABS: 0.3 10*3/uL (ref 0.0–0.7)
Eosinophils Relative: 4 % (ref 0.0–5.0)
HEMATOCRIT: 45.3 % (ref 36.0–46.0)
HEMOGLOBIN: 14.9 g/dL (ref 12.0–15.0)
Lymphocytes Relative: 22.6 % (ref 12.0–46.0)
Lymphs Abs: 2 10*3/uL (ref 0.7–4.0)
MCHC: 33 g/dL (ref 30.0–36.0)
MCV: 85.7 fl (ref 78.0–100.0)
MONO ABS: 0.6 10*3/uL (ref 0.1–1.0)
Monocytes Relative: 6.4 % (ref 3.0–12.0)
NEUTROS ABS: 5.8 10*3/uL (ref 1.4–7.7)
Neutrophils Relative %: 66.1 % (ref 43.0–77.0)
Platelets: 357 10*3/uL (ref 150.0–400.0)
RBC: 5.29 Mil/uL — AB (ref 3.87–5.11)
RDW: 14.3 % (ref 11.5–15.5)
WBC: 8.7 10*3/uL (ref 4.0–10.5)

## 2014-07-10 LAB — LIPID PANEL
CHOL/HDL RATIO: 2
CHOLESTEROL: 166 mg/dL (ref 0–200)
HDL: 82.2 mg/dL (ref >39.00)
LDL CALC: 64 mg/dL (ref 0–99)
NonHDL: 83.8
TRIGLYCERIDES: 101 mg/dL (ref 0.0–149.0)
VLDL: 20.2 mg/dL (ref 0.0–40.0)

## 2014-07-10 LAB — VITAMIN B12: Vitamin B-12: 1413 pg/mL — ABNORMAL HIGH (ref 211–911)

## 2014-07-10 LAB — TSH: TSH: 1.95 u[IU]/mL (ref 0.35–4.50)

## 2014-07-10 LAB — HEMOGLOBIN A1C: Hgb A1c MFr Bld: 5.7 % (ref 4.6–6.5)

## 2014-07-10 LAB — T4, FREE: FREE T4: 0.76 ng/dL (ref 0.60–1.60)

## 2014-07-10 LAB — MAGNESIUM: Magnesium: 2.2 mg/dL (ref 1.5–2.5)

## 2014-07-10 NOTE — Progress Notes (Signed)
Pre visit review using our clinic review tool, if applicable. No additional management support is needed unless otherwise documented below in the visit note.  Chief Complaint  Patient presents with  . Follow-up    HPI: Caitlin [name] 69 y.o.  comes in for chronic disease/ medication management  Last visit 7 15  Very tired and depressed she thinks worse than last year. She is also getting leg cramps. BP seems to be controlled. GI nexium  Still has breath through reflux and has to take times or Pepto-Bismol. Doesn't remember her last endoscopy does not feel like she has dysphasia. Mood :  More depressed. Not interested in counseling her daughter's a Social worker. Can't remember what medicine she was on in the remote past but apparently didn't help her Somewhat concerned about her memory thinks it is getting worse but her husband is getting worse to there is a family history of dementia and her sister and her mom. She states that she will remember what I told her when she leaves the office. But denies problems with calculation. She has concerns about medication she has been on if they could add to dementia. This is Allegra-D. She's tried to use Nasacort over-the-counter and states that his heart GU Synod drips down the front of her nose and doesn't go in. ROS: See pertinent positives and negatives per HPI.  Past Medical History  Diagnosis Date  . Allergy   . GERD (gastroesophageal reflux disease)     History of stricture and dilatation and Nissen surgery  . Hypertension   . Mood disorder   . Erythema nodosum     Tends to be seasonal has had a negative chest x-ray in the past negative PPD  . Diverticulosis of colon   . Meniere's disease     ? dx per dr Marcelline Mates    Family History  Problem Relation Age of Onset  . Cancer Mother   . Depression Mother   . Depression Father   . COPD Sister     Deceased 12/18/09  . Nephrolithiasis      History   Social History  . Marital Status:  Married    Spouse Name: N/A  . Number of Children: N/A  . Years of Education: N/A   Social History Main Topics  . Smoking status: Never Smoker   . Smokeless tobacco: Never Used  . Alcohol Use: No  . Drug Use: No  . Sexual Activity: Not on file   Other Topics Concern  . None   Social History Narrative   Unemployed   Married   [place] of 2    Has grandchildren out of state   No pets   Sis. died 12-18-09 COPD    Outpatient Prescriptions Prior to Visit  Medication Sig Dispense Refill  . cholecalciferol (VITAMIN D) 1000 UNITS tablet Take 2,000 Units by mouth daily.     . fexofenadine-pseudoephedrine (ALLEGRA-D 24) 180-240 MG per 24 hr tablet Take 1 tablet by mouth daily.      [name] FREESTYLE LITE test strip TEST DAILY 100 each 11  . Lancets (FREESTYLE) lancets USE AS DIRECTED ONCE DAILY 100 each 5  . minoxidil (ROGAINE) 2 % external solution Apply topically 2 (two) times daily.      . potassium chloride (KLOR-CON M10) 10 MEQ tablet TAKE ONE TABLET BY MOUTH EVERY OTHER DAY WITH FLUID TABLET 45 tablet 3  . Probiotic Product (PROBIOTIC PO) Take 2 tablets by mouth daily.      [name]  triamterene-hydrochlorothiazide (MAXZIDE-25) 37.5-25 MG per tablet TAKE ONE-HALF TABLET BY MOUTH ONCE DAILY 45 tablet 1  . vitamin B-12 (CYANOCOBALAMIN) 100 MCG tablet Take 500 mcg by mouth daily.      No facility-administered medications prior to visit.     EXAM:  BP 130/82 mmHg  Temp(Src) 98.1 F (36.7 C) (Oral)  Ht 4' 11.5" (1.511 m)  Wt 147 lb 4.8 oz (66.815 kg)  BMI 29.26 kg/m2  Body mass index is 29.26 kg/(m^2).  GENERAL: vitals reviewed and listed above, alert, oriented, appears well hydrated and in no acute distress has a somewhat flat affect little more than usual but good eye contact and normal speech. HEENT: atraumatic, conjunctiva  clear, no obvious abnormalities on inspection of external nose and ears OP : no lesion edema or exudate  NECK: no obvious masses on inspection palpation  LUNGS:  clear to auscultation bilaterally, no wheezes, rales or rhonchi, good air movement CV: HRRR, no clubbing cyanosis or  peripheral edema nl cap refill  Abdomen soft without organomegaly guarding or rebound MS: moves all extremities without noticeable focal  abnormality PSYCH: pleasant and cooperative,  fairly flat affect. Somewhat depressed looking neurologic appears grossly normal motor gait. Formal memory testing not done Lab Results  Component Value Date   WBC 10.3 08/12/2013   HGB 14.2 08/12/2013   HCT 43.0 08/12/2013   PLT 333.0 08/12/2013   GLUCOSE 92 08/12/2013   CHOL 174 08/12/2013   TRIG 131.0 08/12/2013   HDL 77.20 08/12/2013   LDLCALC 71 08/12/2013   ALT 26 08/12/2013   AST 19 08/12/2013   NA 139 08/12/2013   K 4.1 08/12/2013   CL 102 08/12/2013   CREATININE 0.9 08/12/2013   BUN 12 08/12/2013   CO2 32 08/12/2013   TSH 1.27 08/12/2013   HGBA1C 6.3 08/12/2013   MICROALBUR 1.6 04/26/2011     ASSESSMENT AND PLAN:  Discussed the following assessment and plan:  Memory difficulties - Plan: Basic metabolic panel, CBC with Differential/Platelet, Hemoglobin A1c, Hepatic function panel, Lipid panel, TSH, T4, free, Vitamin B12, Magnesium, Ambulatory referral to Neurology  Medication management - Plan: Basic metabolic panel, CBC with Differential/Platelet, Hemoglobin A1c, Hepatic function panel, Lipid panel, TSH, T4, free, Vitamin B12, Magnesium  Elevated BP - Plan: Basic metabolic panel, CBC with Differential/Platelet, Hemoglobin A1c, Hepatic function panel, Lipid panel, TSH, T4, free, Vitamin B12, Magnesium  Fasting hyperglycemia - Plan: Basic metabolic panel, CBC with Differential/Platelet, Hemoglobin A1c, Hepatic function panel, Lipid panel, TSH, T4, free, Vitamin B12, Magnesium  Other fatigue - Plan: Basic metabolic panel, CBC with Differential/Platelet, Hemoglobin A1c, Hepatic function panel, Lipid panel, TSH, T4, free, Vitamin B12, Magnesium, Ambulatory referral to  Neurology  Depression - Plan: Basic metabolic panel, CBC with Differential/Platelet, Hemoglobin A1c, Hepatic function panel, Lipid panel, TSH, T4, free, Vitamin B12, Magnesium  Gastroesophageal reflux disease, esophagitis presence not specified - Plan: Basic metabolic panel, CBC with Differential/Platelet, Hemoglobin A1c, Hepatic function panel, Lipid panel, TSH, T4, free, Vitamin B12, Magnesium  Cramps of lower extremity, unspecified laterality - Plan: Basic metabolic panel, CBC with Differential/Platelet, Hemoglobin A1c, Hepatic function panel, Lipid panel, TSH, T4, free, Vitamin B12, Magnesium  Family history of dementia - Plan: Ambulatory referral to Neurology Blood pressure better on second check appears to be controlled. She has some depressive symptoms looks flat uncertain if memory issues are related she is more concerned about those. Will rule out metabolic she does have leg cramps recheck her thyroid and B12. Reflexes problematic she has had a  Nissen surgery but still has reflux no obvious obstructive symptoms at this time. I suggest that she could get off the Allegra-D and discuss learning the technique of using the nose spray. She can bring it in here to her pharmacist. -Patient advised to return or notify health care team  if symptoms worsen ,persist or new concerns arise.  Patient Instructions   Advise keep trying the nasal cortisone    Poss try flonase  Advice from you pharmacy  .  Need to take every day for 1-2 weeks and then try to get off the allegra D  Will notify you  of labs when available.  Depression meds possible  but because of your concern about memory and family history of Alzheimer's and dementia will arrange a neurology consult. He will be contacted about this appointment.  Healthy lifestyle exercise healthy diet can help prevent dementia.     Standley Brooking. Labrina Lines M.D.

## 2014-07-10 NOTE — Patient Instructions (Addendum)
Advise keep trying the nasal cortisone    Poss try flonase  Advice from you pharmacy  .  Need to take every day for 1-2 weeks and then try to get off the allegra D  Will notify you  of labs when available.  Depression meds possible  but because of your concern about memory and family history of Alzheimer's and dementia will arrange a neurology consult. He will be contacted about this appointment.  Healthy lifestyle exercise healthy diet can help prevent dementia.

## 2014-07-13 ENCOUNTER — Other Ambulatory Visit: Payer: Self-pay

## 2014-07-13 ENCOUNTER — Encounter: Payer: Self-pay | Admitting: Internal Medicine

## 2014-07-22 DIAGNOSIS — L738 Other specified follicular disorders: Secondary | ICD-10-CM | POA: Diagnosis not present

## 2014-07-22 DIAGNOSIS — L814 Other melanin hyperpigmentation: Secondary | ICD-10-CM | POA: Diagnosis not present

## 2014-07-22 DIAGNOSIS — L821 Other seborrheic keratosis: Secondary | ICD-10-CM | POA: Diagnosis not present

## 2014-07-22 DIAGNOSIS — D1801 Hemangioma of skin and subcutaneous tissue: Secondary | ICD-10-CM | POA: Diagnosis not present

## 2014-07-22 DIAGNOSIS — D235 Other benign neoplasm of skin of trunk: Secondary | ICD-10-CM | POA: Diagnosis not present

## 2014-07-22 DIAGNOSIS — D2262 Melanocytic nevi of left upper limb, including shoulder: Secondary | ICD-10-CM | POA: Diagnosis not present

## 2014-08-03 ENCOUNTER — Other Ambulatory Visit: Payer: Self-pay | Admitting: Internal Medicine

## 2014-08-03 NOTE — Telephone Encounter (Signed)
Sent to the pharmacy by e-scribe. 

## 2014-08-17 ENCOUNTER — Other Ambulatory Visit: Payer: Self-pay | Admitting: Internal Medicine

## 2014-08-18 NOTE — Telephone Encounter (Signed)
Pt has an appt on 09/01/14 with neuro.  Last OV said to return after this visit.  When do you want to see her?

## 2014-08-19 NOTE — Telephone Encounter (Signed)
Ok to refill x 2  

## 2014-08-20 NOTE — Telephone Encounter (Signed)
Sent to the pharmacy by e-scribe. 

## 2014-09-01 ENCOUNTER — Encounter: Payer: Self-pay | Admitting: Neurology

## 2014-09-01 ENCOUNTER — Ambulatory Visit (INDEPENDENT_AMBULATORY_CARE_PROVIDER_SITE_OTHER): Payer: Medicare Other | Admitting: Neurology

## 2014-09-01 VITALS — BP 116/82 | HR 80 | Resp 16 | Wt 150.0 lb

## 2014-09-01 DIAGNOSIS — R413 Other amnesia: Secondary | ICD-10-CM | POA: Diagnosis not present

## 2014-09-01 DIAGNOSIS — F329 Major depressive disorder, single episode, unspecified: Secondary | ICD-10-CM

## 2014-09-01 DIAGNOSIS — F411 Generalized anxiety disorder: Secondary | ICD-10-CM

## 2014-09-01 DIAGNOSIS — F32A Depression, unspecified: Secondary | ICD-10-CM

## 2014-09-01 HISTORY — DX: Generalized anxiety disorder: F41.1

## 2014-09-01 NOTE — Patient Instructions (Addendum)
1. Schedule MRI brain without contrast 2. Discuss starting treatment of depression and anxiety with Dr. Regis Bill 3. Physical exercise and brain stimulation exercises are important for brain health 4. Follow-up in 6 months  5. YOU HAVE BEEN SCHEDULED AT [facility] FOR A BRAIN MRI ON 09/10/14. PLEASE ARRIVE @ 1:30 PM.          [address] [place], [place] [address]     [phone]

## 2014-09-01 NOTE — Progress Notes (Signed)
NEUROLOGY CONSULTATION NOTE  TILLIE VIVERETTE MRN: [id number] DOB: October 31, 1945  Referring provider: Dr. Shanon Ace Primary care provider: Dr. Shanon Ace  Reason for consult:  Memory loss  Dear Dr Regis Bill:  Thank you for your kind referral of SONNI BARSE for consultation of the above symptoms. Although her history is well known to you, please allow me to reiterate it for the purpose of our medical record. Records and images were personally reviewed where available.  HISTORY OF PRESENT ILLNESS: This is a 69 year old right-handed woman with a history of hypertension, diet-controlled hyperlipidemia, IBS, GERD, chronic neck and back pain, depression and anxiety, presenting for evaluation of worsening memory. She reports that memory changes started several years ago, she would be unable to think of a name or word she wanted to say. She has had to write everything down otherwise she would forget appointments. She recalls the first time this happened was 12 years ago, she got a reminder call about a dentist appointment 2 days prior but still missed it. She is has been stressed and worried because 2 of her husband's family members have recently passed away with dementia. She continues to drive without getting lost. She denies missing her regular medications but occasionally forgets her vitamins. She has to stay by the stove when cooking, one time she walked away from soup she was making and forgot about it. Her husband is in charge of bill payments. She has always had problems multi-tasking, but has noticed this has been worse the past couple of years, she easily loses her train of thought when distracted.   She has occasional headaches with weather changes, with a dull ache over the frontal region. Taking suphedrine helps. She has had vertigo in the past, MRI brain in 2012 was personally reviewed today, unremarkable with mild diffuse atrophy. She has had a sensation of movement when bending down the  past 1-2 days. She has congestion in her ears and would have decreased hearing with changes in barometric pressure. She denies any diplopia, dysarthria, dysphagia, anosmia, or tremors. She has chronic neck and back pain, and alternating constipation and diarrhea. Her maternal grandmother and 2 brothers were diagnosed with dementia. Her mother and sister had memory problems before they passed away from other medical conditions. She denies any significant head injuries, no alcohol use.   Laboratory Data: Lab Results  Component Value Date   WBC 8.7 07/10/2014   HGB 14.9 07/10/2014   HCT 45.3 07/10/2014   MCV 85.7 07/10/2014   PLT 357.0 07/10/2014     Chemistry      Component Value Date/Time   NA 140 07/10/2014 1129   K 4.4 07/10/2014 1129   CL 100 07/10/2014 1129   CO2 30 07/10/2014 1129   BUN 17 07/10/2014 1129   CREATININE 1.11 07/10/2014 1129      Component Value Date/Time   CALCIUM 10.3 07/10/2014 1129   CALCIUM 10.4 05/13/2010 1225   ALKPHOS 65 07/10/2014 1129   AST 16 07/10/2014 1129   ALT 14 07/10/2014 1129   BILITOT 0.6 07/10/2014 1129     Lab Results  Component Value Date   TSH 1.95 07/10/2014    Lab Results  Component Value Date   UMPNTIRW43 1540* 07/10/2014     PAST MEDICAL HISTORY: Past Medical History  Diagnosis Date  . Allergy   . GERD (gastroesophageal reflux disease)     History of stricture and dilatation and Nissen surgery  . Hypertension   .  Mood disorder   . Erythema nodosum     Tends to be seasonal has had a negative chest x-ray in the past negative PPD  . Diverticulosis of colon   . Meniere's disease     ? dx per dr Marcelline Mates    PAST SURGICAL HISTORY: Past Surgical History  Procedure Laterality Date  . Cholecystectomy    . Esophagogastroduodenoscopy      stricture and dilatation  . Left shoulder surgery  06/08    x 2  . Rt knee surgery      scope  . Abdominal hysterectomy    . Cesarean section      x 1  . Nissen fundoplication       MEDICATIONS: Current Outpatient Prescriptions on File Prior to Visit  Medication Sig Dispense Refill  . cholecalciferol (VITAMIN D) 1000 UNITS tablet Take 2,000 Units by mouth daily.     [name] esomeprazole (NEXIUM) 20 MG capsule Take 20 mg by mouth daily at 12 noon.    [name] KLOR-CON M10 10 MEQ tablet TAKE ONE TABLET BY MOUTH EVERY OTHER DAY WITH FLUID TABLET. NEED OFFICE VISIT 45 tablet 1  . minoxidil (ROGAINE) 2 % external solution Apply topically 2 (two) times daily.      . Probiotic Product (PROBIOTIC PO) Take 2 tablets by mouth daily.      [name] triamterene-hydrochlorothiazide (MAXZIDE-25) 37.5-25 MG per tablet TAKE ONE-HALF TABLET BY MOUTH ONCE DAILY 45 tablet 0  . vitamin B-12 (CYANOCOBALAMIN) 100 MCG tablet Take 500 mcg by mouth daily.      No current facility-administered medications on file prior to visit.    ALLERGIES: Allergies  Allergen Reactions  . Mobic [Meloxicam] Diarrhea  . Codeine Phosphate Other (See Comments)    Hallucinations  . Omeprazole-Sodium Bicarbonate   . Protonix [Pantoprazole Sodium] Diarrhea    Leg nodules  . Sulfamethoxazole Diarrhea and Nausea And Vomiting    FAMILY HISTORY: Family History  Problem Relation Age of Onset  . Cancer Mother   . Depression Mother   . Depression Father   . COPD Sister     Deceased January 02, 2010  . Nephrolithiasis    . Dementia Brother     SOCIAL HISTORY: Social History   Social History  . Marital Status: Married    Spouse Name: N/A  . Number of Children: 1  . Years of Education: N/A   Occupational History  . Retired    Social History Main Topics  . Smoking status: Never Smoker   . Smokeless tobacco: Never Used  . Alcohol Use: No  . Drug Use: No  . Sexual Activity: Not on file   Other Topics Concern  . Not on file   Social History Narrative   Unemployed   Married   [place] of 2    Has grandchildren out of state   No pets   Sis. died January 02, 2010 COPD    REVIEW OF SYSTEMS: Constitutional: No fevers,  chills, or sweats, no generalized fatigue, change in appetite Eyes: No visual changes, double vision, eye pain Ear, nose and throat: No hearing loss, ear pain, nasal congestion, sore throat Cardiovascular: No chest pain, palpitations Respiratory:  + shortness of breath at rest or with exertion,no wheezes GastrointestinaI: No nausea, vomiting, +diarrhea, no abdominal pain, fecal incontinence Genitourinary:  No dysuria, urinary retention or frequency Musculoskeletal:  + neck pain, back pain Integumentary: No rash, pruritus, skin lesions Neurological: as above Psychiatric: No depression, insomnia, anxiety Endocrine: No palpitations, fatigue, diaphoresis, mood swings,  change in appetite, change in weight, increased thirst Hematologic/Lymphatic:  No anemia, purpura, petechiae. Allergic/Immunologic: no itchy/runny eyes, nasal congestion, recent allergic reactions, rashes  PHYSICAL EXAM: Filed Vitals:   09/01/14 1351  BP: 116/82  Pulse: 80  Resp: 16   General: No acute distress Head:  Normocephalic/atraumatic Eyes: Fundoscopic exam shows bilateral sharp discs, no vessel changes, exudates, or hemorrhages Neck: supple, no paraspinal tenderness, full range of motion Back: No paraspinal tenderness Heart: regular rate and rhythm Lungs: Clear to auscultation bilaterally. Vascular: No carotid bruits. Skin/Extremities: No rash, no edema Neurological Exam: Mental status: alert and oriented to person, place, and time, no dysarthria or aphasia, Fund of knowledge is appropriate.  Recent and remote memory are intact.  Attention and concentration are normal.    Able to name objects and repeat phrases.  MMSE - Mini Mental State Exam 09/01/2014  Orientation to time 5  Orientation to Place 5  Registration 3  Attention/ Calculation 5  Recall 3  Language- name 2 objects 2  Language- repeat 1  Language- follow 3 step command 3  Language- read & follow direction 1  Write a sentence 1  Copy design 1    Total score 30   Cranial nerves: CN I: not tested CN II: pupils equal, round and reactive to light, visual fields intact, fundi unremarkable. CN III, IV, VI:  full range of motion, no nystagmus, no ptosis CN V: facial sensation intact CN VII: upper and lower face symmetric CN VIII: hearing intact to finger rub CN IX, X: gag intact, uvula midline CN XI: sternocleidomastoid and trapezius muscles intact CN XII: tongue midline Bulk & Tone: normal, no fasciculations. Motor: 5/5 throughout with no pronator drift. Sensation: decreased pin on right UE, otherwise intact to light touch, cold, vibration and joint position sense.  No extinction to double simultaneous stimulation.  Romberg test negative Deep Tendon Reflexes: +1 on both UE, +2 bilateral patella, absent ankle jerks bilaterally, no ankle clonus Plantar responses: downgoing bilaterally Cerebellar: no incoordination on finger to nose, heel to shin. No dysdiadochokinesia Gait: narrow-based and steady, able to tandem walk adequately. Tremor: none  IMPRESSION: This is a 69 year old right-handed woman with a history of hypertension, diet-controlled hyperlipidemia, depression, anxiety, GERD, presenting for evaluation of memory loss that has been worsening recently. Neurological exam today normal except for subjective decreased sensation on the right arm. MMSE normal 30/30. We discussed different causes of memory loss. TSH and B12 normal. MRI brain without contrast will be ordered to assess for underlying structural abnormality and assess vascular load. 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 She does feel that depression and anxiety need to be better controlled, and will discuss treatment with her PCP. We discussed her concern about Nexium and memory loss, discussing benefits versus risks of medications. Her GERD is pretty severe, at this point, I reassured the patient about normal exam and recommendation to  continue Nexium. No indication to start cholinesterase inhibitors such as Aricept at this time. We discussed the importance of control of vascular risk factors, physical exercise, and brain stimulation exercises for brain health. She will follow-up in 6 months or earlier if needed.   Thank you for allowing me to participate in the care of this patient. Please do not hesitate to call for any questions or concerns.   [name], M.D.  CC: Dr. Regis Bill

## 2014-09-04 ENCOUNTER — Telehealth: Payer: Self-pay | Admitting: Internal Medicine

## 2014-09-04 NOTE — Telephone Encounter (Signed)
Pt saw neuro (Dr Delice Lesch) last tues, and states she had discussed w/ dr Kathleen Argue about going on a med for depression and anxiety. Dr Delice Lesch does not think her dementia symptoms are from any meds she is on.  Pt preferred I ask if she needs to come in for this med, bc she Had already spoken to Dr Regis Bill about this issue. But would like to start asap  [facility]  Pt is sch for MRI on 8/26

## 2014-09-05 NOTE — Telephone Encounter (Signed)
   Begin  Sertraline 50 mg  Take 25 mg per day for a week then 50 mg per day . Disp.  30 refill x 1    ROV in  About 3 weeks . ( 2 slots )    Important to caution  her that any of these meds can cause nausea when first start and then goes away . Shouldn't flare up her reflux .

## 2014-09-07 MED ORDER — SERTRALINE HCL 50 MG PO TABS: ORAL_TABLET | ORAL | Status: DC

## 2014-09-07 NOTE — Telephone Encounter (Signed)
sertraline  is also used for anxiety  (A controller med Not a rescue med)     Have her fu  Disc at OV. As advised

## 2014-09-07 NOTE — Telephone Encounter (Signed)
Pt notified that rx has been sent to the pharmacy.  While on the phone she mentioned that she thinks her anxiety is "fueling her depression."  Would like anxiety medication sent to the pharmacy as well. Please advise.  Thanks!

## 2014-09-07 NOTE — Telephone Encounter (Signed)
Pt notified that medication used for anxiety and depression.  She will call back if needed.  Reminded to keep her upcoming appt.

## 2014-09-07 NOTE — Telephone Encounter (Signed)
Left a message on machine for a return call.

## 2014-09-11 DIAGNOSIS — R413 Other amnesia: Secondary | ICD-10-CM | POA: Diagnosis not present

## 2014-09-15 ENCOUNTER — Telehealth: Payer: Self-pay | Admitting: Family Medicine

## 2014-09-15 NOTE — Telephone Encounter (Signed)
-----   Message from Cameron Sprang, MD sent at 09/14/2014  5:07 PM EDT ----- Regarding: mri results Pls let her know I reviewed MRI brain and it is unremarkable, no evidence of tumor, stroke, or bleed. It shows age-related changes. Thanks

## 2014-09-15 NOTE — Telephone Encounter (Signed)
Patient was notified of results.  

## 2014-10-06 ENCOUNTER — Encounter: Payer: Self-pay | Admitting: Internal Medicine

## 2014-10-06 ENCOUNTER — Ambulatory Visit (INDEPENDENT_AMBULATORY_CARE_PROVIDER_SITE_OTHER): Payer: Medicare Other | Admitting: Internal Medicine

## 2014-10-06 VITALS — BP 140/80 | Temp 97.9°F | Ht 59.5 in | Wt 149.8 lb

## 2014-10-06 DIAGNOSIS — F32A Depression, unspecified: Secondary | ICD-10-CM

## 2014-10-06 DIAGNOSIS — F329 Major depressive disorder, single episode, unspecified: Secondary | ICD-10-CM | POA: Diagnosis not present

## 2014-10-06 DIAGNOSIS — Z23 Encounter for immunization: Secondary | ICD-10-CM

## 2014-10-06 DIAGNOSIS — F4323 Adjustment disorder with mixed anxiety and depressed mood: Secondary | ICD-10-CM | POA: Diagnosis not present

## 2014-10-06 MED ORDER — SERTRALINE HCL 50 MG PO TABS: ORAL_TABLET | ORAL | Status: DC

## 2014-10-06 MED ORDER — POTASSIUM CHLORIDE CRYS ER 10 MEQ PO TBCR: EXTENDED_RELEASE_TABLET | ORAL | Status: DC

## 2014-10-06 NOTE — Progress Notes (Signed)
Pre visit review using our clinic review tool, if applicable. No additional management support is needed unless otherwise documented below in the visit note.  Chief Complaint  Patient presents with  . Follow-up    HPI: Caitlin [name] 69 y.o. comes in for  Fu number of issues .  She had evaluation by neurologist for memory problems and was felt to have some underlying depression anxiety to consider treatment. She was begun on sertraline 25 mg a day for week and then 50 mg a day. She comes in for follow-up now.  Not as  irritabbe  Some shakiness   At times but some of this predated her medication. Uncertain if fatigue or drowsiness could be from the medicine or not. No GI symptoms. Noted some improvement in how she felt the first or second day she started the medication. Will need refill medication.  Saw dr Lora Paula  And felt balance is not good.   But no specific neuro diagnosis she does have a follow-up in 6 months.  ROS: See pertinent positives and negatives per HPI. She reports an episode in the hot tub when she went to the beach of weak feeling in the legs after experiencing a very hot water immersion. She's better now.  Past Medical History  Diagnosis Date  . Allergy   . GERD (gastroesophageal reflux disease)     History of stricture and dilatation and Nissen surgery  . Hypertension   . Mood disorder   . Erythema nodosum     Tends to be seasonal has had a negative chest x-ray in the past negative PPD  . Diverticulosis of colon   . Meniere's disease     ? dx per dr Marcelline Mates    Family History  Problem Relation Age of Onset  . Cancer Mother   . Depression Mother   . Depression Father   . COPD Sister     Deceased 12-10-09  . Nephrolithiasis    . Dementia Brother     Social History   Social History  . Marital Status: Married    Spouse Name: N/A  . Number of Children: 1  . Years of Education: N/A   Occupational History  . Retired    Social History Main Topics  .  Smoking status: Never Smoker   . Smokeless tobacco: Never Used  . Alcohol Use: No  . Drug Use: No  . Sexual Activity: Not Asked   Other Topics Concern  . None   Social History Narrative   Unemployed   Married   [place] of 2    Has grandchildren out of state   No pets   Sis. died 12-10-09 COPD    Outpatient Prescriptions Prior to Visit  Medication Sig Dispense Refill  . calcium carbonate (TUMS EX) 750 MG chewable tablet Chew 1 tablet by mouth as needed for heartburn.    . cholecalciferol (VITAMIN D) 1000 UNITS tablet Take 2,000 Units by mouth daily.     [name] esomeprazole (NEXIUM) 20 MG capsule Take 20 mg by mouth daily at 12 noon.    . minoxidil (ROGAINE) 2 % external solution Apply topically 2 (two) times daily.      . Naproxen Sodium 220 MG CAPS Take by mouth. Takes as needed for headache    . Probiotic Product (PROBIOTIC PO) Take 2 tablets by mouth daily.      [name] triamterene-hydrochlorothiazide (MAXZIDE-25) 37.5-25 MG per tablet TAKE ONE-HALF TABLET BY MOUTH ONCE DAILY 45 tablet 0  .  vitamin B-12 (CYANOCOBALAMIN) 100 MCG tablet Take 500 mcg by mouth daily.     [name] KLOR-CON M10 10 MEQ tablet TAKE ONE TABLET BY MOUTH EVERY OTHER DAY WITH FLUID TABLET. NEED OFFICE VISIT 45 tablet 1  . sertraline (ZOLOFT) 50 MG tablet Take half a tablet for one week then one whole tablet. 30 tablet 1  . ranitidine (ZANTAC) 150 MG tablet Take 150 mg by mouth as needed.      No facility-administered medications prior to visit.     EXAM:  BP 140/80 mmHg  Temp(Src) 97.9 F (36.6 C) (Oral)  Ht 4' 11.5" (1.511 m)  Wt 149 lb 12.8 oz (67.949 kg)  BMI 29.76 kg/m2  Body mass index is 29.76 kg/(m^2).  GENERAL: vitals reviewed and listed above, alert, oriented, appears well hydrated and in no acute distress HEENT: atraumatic, conjunctiva  clear, no obvious abnormalities on inspection of external nose and ears  NECK: no obvious masses on inspection palpation  LUNGS: clear to auscultation bilaterally, no  wheezes, rales or rhonchi, good air movement CV: HRRR, no clubbing cyanosis or  peripheral edema nl cap refill  MS: moves all extremities without noticeable focal  abnormality PSYCH: pleasant and cooperative, more animated than usual less depressed no significant tremor although an intention has a fine tremor at times.   ASSESSMENT AND PLAN:  Discussed the following assessment and plan:  Adjustment reaction with anxiety and depression - She appears improved on 3 weeks of medicine may be having some mild side effects trial decreased to 25 mg increase if needed.  Depression  Need for prophylactic vaccination and inoculation against influenza - Plan: Flu vaccine HIGH DOSE PF (Fluzone High dose)  Need for 23-polyvalent pneumococcal polysaccharide vaccine - Plan: Pneumococcal polysaccharide vaccine 23-valent greater than or equal to 2yo subcutaneous/IM Plan follow-up visit in 6-8 weeks after steady state on medication and see how she's doing. The interim she describes as potential side effects may not be from the medication some have predated because of her positive response went to continue on current course with just dosage adjustment. -Patient advised to return or notify health care team  if symptoms worsen ,persist or new concerns arise. Total visit 42mins > 50% spent counseling and coordinating care as indicated in above note and in instructions to patient .   Patient Instructions  For the next 1-2 weeks take 1/2 pill per day ( 25 mg  Sertaline)    If doing well can remain on that dose .  If not try back to 50 mg per day after  The 1-2 weeks .   ROV  In about   6 weeks .   Standley Brooking. Panosh M.D.

## 2014-10-06 NOTE — Patient Instructions (Signed)
For the next 1-2 weeks take 1/2 pill per day ( 25 mg  Sertaline)    If doing well can remain on that dose .  If not try back to 50 mg per day after  The 1-2 weeks .   ROV  In about   6 weeks .

## 2014-10-12 DIAGNOSIS — H2513 Age-related nuclear cataract, bilateral: Secondary | ICD-10-CM | POA: Diagnosis not present

## 2014-10-12 DIAGNOSIS — H43399 Other vitreous opacities, unspecified eye: Secondary | ICD-10-CM | POA: Diagnosis not present

## 2014-10-12 DIAGNOSIS — H25013 Cortical age-related cataract, bilateral: Secondary | ICD-10-CM | POA: Diagnosis not present

## 2014-10-12 DIAGNOSIS — H35363 Drusen (degenerative) of macula, bilateral: Secondary | ICD-10-CM | POA: Diagnosis not present

## 2014-10-12 DIAGNOSIS — H02206 Unspecified lagophthalmos left eye, unspecified eyelid: Secondary | ICD-10-CM | POA: Diagnosis not present

## 2014-10-12 LAB — HM DIABETES EYE EXAM

## 2014-10-21 ENCOUNTER — Encounter: Payer: Self-pay | Admitting: Family Medicine

## 2014-11-03 ENCOUNTER — Other Ambulatory Visit: Payer: Self-pay | Admitting: Internal Medicine

## 2014-11-04 NOTE — Telephone Encounter (Signed)
Sent to the pharmacy by e-scribe. 

## 2014-11-16 ENCOUNTER — Encounter: Payer: Self-pay | Admitting: Internal Medicine

## 2014-11-16 ENCOUNTER — Ambulatory Visit (INDEPENDENT_AMBULATORY_CARE_PROVIDER_SITE_OTHER): Payer: Medicare Other | Admitting: Internal Medicine

## 2014-11-16 VITALS — BP 132/78 | Temp 98.0°F | Ht 59.5 in | Wt 151.3 lb

## 2014-11-16 DIAGNOSIS — R413 Other amnesia: Secondary | ICD-10-CM

## 2014-11-16 DIAGNOSIS — R198 Other specified symptoms and signs involving the digestive system and abdomen: Secondary | ICD-10-CM

## 2014-11-16 DIAGNOSIS — F32A Depression, unspecified: Secondary | ICD-10-CM

## 2014-11-16 DIAGNOSIS — F329 Major depressive disorder, single episode, unspecified: Secondary | ICD-10-CM | POA: Diagnosis not present

## 2014-11-16 DIAGNOSIS — Z820 Family history of epilepsy and other diseases of the nervous system: Secondary | ICD-10-CM

## 2014-11-16 DIAGNOSIS — K30 Functional dyspepsia: Secondary | ICD-10-CM

## 2014-11-16 DIAGNOSIS — F4323 Adjustment disorder with mixed anxiety and depressed mood: Secondary | ICD-10-CM | POA: Diagnosis not present

## 2014-11-16 DIAGNOSIS — Z818 Family history of other mental and behavioral disorders: Secondary | ICD-10-CM

## 2014-11-16 DIAGNOSIS — Z79899 Other long term (current) drug therapy: Secondary | ICD-10-CM

## 2014-11-16 MED ORDER — SERTRALINE HCL 50 MG PO TABS: 50.0000 mg | ORAL_TABLET | Freq: Every day | ORAL | Status: DC

## 2014-11-16 NOTE — Patient Instructions (Addendum)
Stay now on 50 mg sertraline .  ROv in 4-6 months after you see neurology  Can decrease the probiotic  . Eustachian tube  Dysfunction.  Could be flaring up from the allergy season.

## 2014-11-16 NOTE — Progress Notes (Signed)
Pre visit review using our clinic review tool, if applicable. No additional management support is needed unless otherwise documented below in the visit note.  Chief Complaint  Patient presents with  . Follow-up    med    HPI: Caitlin [name] 69 y.o. comes in for fu  Of   25 mg .     Thinks does better on the 50 mg  Feels herself getting anxious a bit more  Uncertain if change in memory but perhaps" doesn't care as much if doesn't remember".   ROS: See pertinent positives and negatives per HPI.ears bothering her again pressure  As in  Past  Off allegra  d . otc nose spray.  Trying to dec the ppi  Qod if possible   Probiotic bid giver her inc stools  Consider metamucil  Past Medical History  Diagnosis Date  . Allergy   . GERD (gastroesophageal reflux disease)     History of stricture and dilatation and Nissen surgery  . Hypertension   . Mood disorder ([place])   . Erythema nodosum     Tends to be seasonal has had a negative chest x-ray in the past negative PPD  . Diverticulosis of colon   . Meniere's disease     ? dx per dr Marcelline Mates    Family History  Problem Relation Age of Onset  . Cancer Mother   . Depression Mother   . Depression Father   . COPD Sister     Deceased 2009-11-23  . Nephrolithiasis    . Dementia Brother     Social History   Social History  . Marital Status: Married    Spouse Name: N/A  . Number of Children: 1  . Years of Education: N/A   Occupational History  . Retired    Social History Main Topics  . Smoking status: Never Smoker   . Smokeless tobacco: Never Used  . Alcohol Use: No  . Drug Use: No  . Sexual Activity: Not Asked   Other Topics Concern  . None   Social History Narrative   Unemployed   Married   [place] of 2    Has grandchildren out of state   No pets   Sis. died Nov 23, 2009 COPD    Outpatient Prescriptions Prior to Visit  Medication Sig Dispense Refill  . calcium carbonate (TUMS EX) 750 MG chewable tablet Chew 1 tablet by  mouth as needed for heartburn.    . cholecalciferol (VITAMIN D) 1000 UNITS tablet Take 2,000 Units by mouth daily.     [name] esomeprazole (NEXIUM) 20 MG capsule Take 20 mg by mouth daily at 12 noon.    . minoxidil (ROGAINE) 2 % external solution Apply topically 2 (two) times daily.      . Naproxen Sodium 220 MG CAPS Take by mouth. Takes as needed for headache    . potassium chloride (KLOR-CON M10) 10 MEQ tablet TAKE ONE TABLET BY MOUTH EVERY OTHER DAY WITH FLUID TABLET. 45 tablet 1  . Probiotic Product (PROBIOTIC PO) Take 2 tablets by mouth daily.      [name] triamterene-hydrochlorothiazide (MAXZIDE-25) 37.5-25 MG tablet TAKE ONE-HALF TABLET BY MOUTH ONCE DAILY 45 tablet 1  . vitamin B-12 (CYANOCOBALAMIN) 100 MCG tablet Take 500 mcg by mouth daily.     . sertraline (ZOLOFT) 50 MG tablet Take daily as directed. 30 tablet 1   No facility-administered medications prior to visit.     EXAM:  BP 132/78 mmHg  Temp(Src) 98 F (  36.7 C) (Oral)  Ht 4' 11.5" (1.511 m)  Wt 151 lb 4.8 oz (68.629 kg)  BMI 30.06 kg/m2  Body mass index is 30.06 kg/(m^2).  GENERAL: vitals reviewed and listed above, alert, oriented, appears well hydrated and in no acute distress HEENT: atraumatic, conjunctiva  clear, no obvious abnormalities on inspection of external nose and ears  tmx nafindings OP : no lesion edema or exudate  NECK: no obvious masses on inspection palpation  PSYCH: pleasant and cooperative, no obvious depression min  anxiety Lab Results  Component Value Date   WBC 8.7 07/10/2014   HGB 14.9 07/10/2014   HCT 45.3 07/10/2014   PLT 357.0 07/10/2014   GLUCOSE 98 07/10/2014   CHOL 166 07/10/2014   TRIG 101.0 07/10/2014   HDL 82.20 07/10/2014   LDLCALC 64 07/10/2014   ALT 14 07/10/2014   AST 16 07/10/2014   NA 140 07/10/2014   K 4.4 07/10/2014   CL 100 07/10/2014   CREATININE 1.11 07/10/2014   BUN 17 07/10/2014   CO2 30 07/10/2014   TSH 1.95 07/10/2014   HGBA1C 5.7 07/10/2014   MICROALBUR 1.6  04/26/2011    ASSESSMENT AND PLAN:  Discussed the following assessment and plan:  Adjustment reaction with anxiety and depression - seems to do better on 50 mg  will go mback up on dose   disc flattening or fatigue can take at night.   Depression - seems better on meds   Medication management  Memory difficulties - keep fu with neuro  Ear sx as before no acute infection poss allergic or eustachian tube dysfunction. Gi s  Can titrated with probiotics and  Etc to avoid constipation and   Frequent stooling if inconvenient ok to try metamucil  -Patient advised to return or notify health care team  if symptoms worsen ,persist or new concerns arise.  Patient Instructions  Stay now on 50 mg sertraline .  ROv in 4-6 months after you see neurology  Can decrease the probiotic  . Eustachian tube  Dysfunction.  Could be flaring up from the allergy season.      Standley Brooking. Hortence Charter M.D.

## 2014-12-16 ENCOUNTER — Other Ambulatory Visit: Payer: Self-pay | Admitting: Internal Medicine

## 2015-01-13 ENCOUNTER — Encounter: Payer: Self-pay | Admitting: *Deleted

## 2015-03-08 ENCOUNTER — Ambulatory Visit (INDEPENDENT_AMBULATORY_CARE_PROVIDER_SITE_OTHER): Payer: Medicare Other | Admitting: Neurology

## 2015-03-08 ENCOUNTER — Encounter: Payer: Self-pay | Admitting: Neurology

## 2015-03-08 VITALS — BP 120/70 | HR 75 | Resp 16 | Wt 151.0 lb

## 2015-03-08 DIAGNOSIS — R413 Other amnesia: Secondary | ICD-10-CM

## 2015-03-08 DIAGNOSIS — F411 Generalized anxiety disorder: Secondary | ICD-10-CM | POA: Diagnosis not present

## 2015-03-08 NOTE — Progress Notes (Signed)
NEUROLOGY FOLLOW UP OFFICE NOTE  BANNIE KHAN MV:[id number]  HISTORY OF PRESENT ILLNESS: I had the pleasure of seeing Vallie Homsey in follow-up in the neurology clinic on 03/08/2015.  The patient was last seen 6 months ago for worsening memory. MMSE in August 2016 was normal 30/30. She feels she is doing better. Records and images were reviewed. I personally reviewed MRI brain without contrast which did not show any acute changes, there was mild diffuse atrophy. TSH and B12 normal. We had previously discussed effects of mood on memory, she has since been started on Zoloft. She was drowsy on the full tablet, but had more anxiety on the half tablet. She is back to taking a full tablet in the morning. She had stopped Nexium 6 months ago, but reflux has been bad. She occasionally forgets her medications. She denies getting lost driving. Her husband has always been in charge of bills. She denies any headaches, dizziness, diplopia, focal numbness/tingling/weakness, no falls.   HPI 09/01/14: This is a 70 yo RH woman with a history of hypertension, diet-controlled hyperlipidemia, IBS, GERD, chronic neck and back pain, depression and anxiety, who presented for worsening memory. She reports that memory changes started several years ago, she would be unable to think of a name or word she wanted to say. She has had to write everything down otherwise she would forget appointments. She recalls the first time this happened was 12 years ago, she got a reminder call about a dentist appointment 2 days prior but still missed it. She is has been stressed and worried because 2 of her husband's family members have recently passed away with dementia. She continues to drive without getting lost. She denies missing her regular medications but occasionally forgets her vitamins. She has to stay by the stove when cooking, one time she walked away from soup she was making and forgot about it. Her husband is in charge of bill payments.  She has always had problems multi-tasking, but has noticed this has been worse the past couple of years, she easily loses her train of thought when distracted. Her maternal grandmother and 2 brothers were diagnosed with dementia. Her mother and sister had memory problems before they passed away from other medical conditions. She denies any significant head injuries, no alcohol use.   Diagnostic Data: MRI brain in 2012 was personally reviewed today, unremarkable with mild diffuse atrophy. Similar to MRI brain done 08/2014 as above.  PAST MEDICAL HISTORY: Past Medical History  Diagnosis Date  . Allergy   . GERD (gastroesophageal reflux disease)     History of stricture and dilatation and Nissen surgery  . Hypertension   . Mood disorder ([place])   . Erythema nodosum     Tends to be seasonal has had a negative chest x-ray in the past negative PPD  . Diverticulosis of colon   . Meniere's disease     ? dx per dr Marcelline Mates    MEDICATIONS: Current Outpatient Prescriptions on File Prior to Visit  Medication Sig Dispense Refill  . calcium carbonate (TUMS EX) 750 MG chewable tablet Chew 1 tablet by mouth as needed for heartburn.    . cholecalciferol (VITAMIN D) 1000 UNITS tablet Take 2,000 Units by mouth daily.     . minoxidil (ROGAINE) 2 % external solution Apply topically daily.     . potassium chloride (KLOR-CON M10) 10 MEQ tablet TAKE ONE TABLET BY MOUTH EVERY OTHER DAY WITH FLUID TABLET. 45 tablet 1  . Probiotic Product (  PROBIOTIC PO) Take 2 tablets by mouth daily.      . sertraline (ZOLOFT) 50 MG tablet Take 1 tablet (50 mg total) by mouth daily. Take daily as directed. 30 tablet 5  . triamterene-hydrochlorothiazide (MAXZIDE-25) 37.5-25 MG tablet TAKE ONE-HALF TABLET BY MOUTH ONCE DAILY 45 tablet 1  . vitamin B-12 (CYANOCOBALAMIN) 100 MCG tablet Take 500 mcg by mouth daily.     [name] esomeprazole (NEXIUM) 20 MG capsule Take 20 mg by mouth daily at 12 noon. Reported on 03/08/2015    . Naproxen Sodium  220 MG CAPS Take by mouth. Reported on 03/08/2015     No current facility-administered medications on file prior to visit.    ALLERGIES: Allergies  Allergen Reactions  . Mobic [Meloxicam] Diarrhea  . Codeine Phosphate Other (See Comments)    Hallucinations  . Omeprazole-Sodium Bicarbonate   . Protonix [Pantoprazole Sodium] Diarrhea    Leg nodules  . Sulfamethoxazole Diarrhea and Nausea And Vomiting    FAMILY HISTORY: Family History  Problem Relation Age of Onset  . Cancer Mother   . Depression Mother   . Depression Father   . COPD Sister     Deceased December 17, 2009  . Nephrolithiasis    . Dementia Brother     SOCIAL HISTORY: Social History   Social History  . Marital Status: Married    Spouse Name: N/A  . Number of Children: 1  . Years of Education: N/A   Occupational History  . Retired    Social History Main Topics  . Smoking status: Never Smoker   . Smokeless tobacco: Never Used  . Alcohol Use: No  . Drug Use: No  . Sexual Activity: Not on file   Other Topics Concern  . Not on file   Social History Narrative   Unemployed   Married   [place] of 2    Has grandchildren out of state   No pets   Sis. died 12/17/2009 COPD    REVIEW OF SYSTEMS: Constitutional: No fevers, chills, or sweats, no generalized fatigue, change in appetite Eyes: No visual changes, double vision, eye pain Ear, nose and throat: No hearing loss, ear pain, nasal congestion, sore throat Cardiovascular: No chest pain, palpitations Respiratory:  No shortness of breath at rest or with exertion, wheezes GastrointestinaI: No nausea, vomiting, +diarrhea,no abdominal pain, fecal incontinence Genitourinary:  No dysuria, urinary retention or frequency Musculoskeletal:  + neck pain, back pain Integumentary: No rash, pruritus, skin lesions Neurological: as above Psychiatric: No depression, insomnia, anxiety Endocrine: No palpitations, fatigue, diaphoresis, mood swings, change in appetite, change  in weight, increased thirst Hematologic/Lymphatic:  No anemia, purpura, petechiae. Allergic/Immunologic: no itchy/runny eyes, nasal congestion, recent allergic reactions, rashes  PHYSICAL EXAM: Filed Vitals:   03/08/15 1456  BP: 120/70  Pulse: 75  Resp: 16   General: No acute distress Head:  Normocephalic/atraumatic Neck: supple, no paraspinal tenderness, full range of motion Heart:  Regular rate and rhythm Lungs:  Clear to auscultation bilaterally Back: No paraspinal tenderness Skin/Extremities: No rash, no edema Neurological Exam: alert and oriented to person, place, and time. No aphasia or dysarthria. Fund of knowledge is appropriate.  Recent and remote memory are intact.  Attention and concentration are normal.    Able to name objects and repeat phrases. CDT 5/5. MMSE - Mini Mental State Exam 03/08/2015 09/01/2014  Orientation to time 5 5  Orientation to Place 5 5  Registration 3 3  Attention/ Calculation 5 5  Recall 3 3  Language- name  2 objects 2 2  Language- repeat 1 1  Language- follow 3 step command 3 3  Language- read & follow direction 1 1  Write a sentence 1 1  Copy design 1 1  Total score 30 30    Cranial nerves: Pupils equal, round, reactive to light.  Extraocular movements intact with no nystagmus. Visual fields full. Facial sensation intact. No facial asymmetry. Tongue, uvula, palate midline.  Motor: Bulk and tone normal, muscle strength 5/5 throughout with no pronator drift.  Sensation to light touch intact.  No extinction to double simultaneous stimulation.  Deep tendon reflexes 2+ throughout, toes downgoing.  Finger to nose testing intact.  Gait narrow-based and steady, able to tandem walk adequately.  Romberg negative.  IMPRESSION: This is a 70 yo RH woman with a history of hypertension, diet-controlled hyperlipidemia, depression, anxiety, GERD,who presented for worsening memory loss. MMSE today again normal 30/30 (previously 30/30 in August 2016). MRI brain  unremarkable, TSH and B12 normal. She reports that she feels memory is better. She has been started on Zoloft and has noticed it helps with anxiety but causes drowsiness. She will try taking this at night. She had stopped Nexium due to concern that it can cause dementia, we discussed that there has been no proven direct link to PPIs and dementia, and that it is important to weight benefit versus risk, particularly with her severe GERD. She is agreeable to restarting Nexium but does not want to take it daily. She remains very concerned about family history of dementia and a sister-in-law with early dementia, patient was again reassured about normal exam. No indication to start cholinesterase inhibitors such as Aricept. We again discussed the importance of control of vascular risk factors, physical exercise, and brain stimulation exercises for brain health. She will follow-up in 1 year or earlier if needed.   Thank you for allowing me to participate in her care.  Please do not hesitate to call for any questions or concerns.  The duration of this appointment visit was 25 minutes of face-to-face time with the patient.  Greater than 50% of this time was spent in counseling, explanation of diagnosis, planning of further management, and coordination of care.   [name], M.D.   CC: Dr. Regis Bill

## 2015-03-08 NOTE — Patient Instructions (Signed)
1. You continue to look good, continue with Zoloft, try taking at night 2. Control of blood pressure, cholesterol, as well as physical exercise and brain stimulation exercises are important for brain health 3. Would restart Nexium 4. Follow-up in 1 year, call for any changes

## 2015-04-07 ENCOUNTER — Encounter: Payer: Self-pay | Admitting: Internal Medicine

## 2015-04-07 ENCOUNTER — Ambulatory Visit (INDEPENDENT_AMBULATORY_CARE_PROVIDER_SITE_OTHER): Payer: Medicare Other | Admitting: Internal Medicine

## 2015-04-07 VITALS — BP 136/80 | Temp 97.8°F | Ht 59.5 in | Wt 152.7 lb

## 2015-04-07 DIAGNOSIS — Z79899 Other long term (current) drug therapy: Secondary | ICD-10-CM | POA: Diagnosis not present

## 2015-04-07 DIAGNOSIS — K219 Gastro-esophageal reflux disease without esophagitis: Secondary | ICD-10-CM

## 2015-04-07 DIAGNOSIS — K30 Functional dyspepsia: Secondary | ICD-10-CM

## 2015-04-07 DIAGNOSIS — J3089 Other allergic rhinitis: Secondary | ICD-10-CM

## 2015-04-07 DIAGNOSIS — F4323 Adjustment disorder with mixed anxiety and depressed mood: Secondary | ICD-10-CM

## 2015-04-07 DIAGNOSIS — R198 Other specified symptoms and signs involving the digestive system and abdomen: Secondary | ICD-10-CM

## 2015-04-07 MED ORDER — SERTRALINE HCL 25 MG PO TABS: 37.5000 mg | ORAL_TABLET | Freq: Every day | ORAL | Status: DC

## 2015-04-07 NOTE — Progress Notes (Signed)
Pre visit review using our clinic review tool, if applicable. No additional management support is needed unless otherwise documented below in the visit note.  Chief Complaint  Patient presents with  . Follow-up    anxiety  gi sx  meds    HPI: Caitlin [name] 70 y.o.  sertraline helps  25 some drowsiness also . Down to 25 mg per day to limit se. Is less irritable and anxious on this med .  Drainage   And cough at night.  Now on nexium  Again .    About  3 weeks  . Less heart burn   No wheezing   Gi frequent loose stools  ? No change no fever   Tends to have  Inc sx with anxiety  Other foods dairy at times  Per dr Delice Lesch  And fu 1 year 1. You continue to look good, continue with Zoloft, try taking at night 2. Control of blood pressure, cholesterol, as well as physical exercise and brain stimulation exercises are important for brain health 3. Would restart Nexium 4. Follow-up in 1 year, call for any changes ROS: See pertinent positives and negatives per HPI.  Past Medical History  Diagnosis Date  . Allergy   . GERD (gastroesophageal reflux disease)     History of stricture and dilatation and Nissen surgery  . Hypertension   . Mood disorder ([place])   . Erythema nodosum     Tends to be seasonal has had a negative chest x-ray in the past negative PPD  . Diverticulosis of colon   . Meniere's disease     ? dx per dr Marcelline Mates    Family History  Problem Relation Age of Onset  . Cancer Mother   . Depression Mother   . Depression Father   . COPD Sister     Deceased 2009-11-23  . Nephrolithiasis    . Dementia Brother     Social History   Social History  . Marital Status: Married    Spouse Name: N/A  . Number of Children: 1  . Years of Education: N/A   Occupational History  . Retired    Social History Main Topics  . Smoking status: Never Smoker   . Smokeless tobacco: Never Used  . Alcohol Use: No  . Drug Use: No  . Sexual Activity: Not Asked   Other Topics Concern    . None   Social History Narrative   Unemployed   Married   [place] of 2    Has grandchildren out of state   No pets   Sis. died 2009-11-23 COPD    Outpatient Prescriptions Prior to Visit  Medication Sig Dispense Refill  . calcium carbonate (TUMS EX) 750 MG chewable tablet Chew 1 tablet by mouth as needed for heartburn.    . cholecalciferol (VITAMIN D) 1000 UNITS tablet Take 2,000 Units by mouth daily.     [name] esomeprazole (NEXIUM) 20 MG capsule Take 20 mg by mouth daily at 12 noon. Reported on 03/08/2015    . minoxidil (ROGAINE) 2 % external solution Apply topically daily.     . Naproxen Sodium 220 MG CAPS Take by mouth. Reported on 03/08/2015    . potassium chloride (KLOR-CON M10) 10 MEQ tablet TAKE ONE TABLET BY MOUTH EVERY OTHER DAY WITH FLUID TABLET. 45 tablet 1  . Probiotic Product (PROBIOTIC PO) Take 2 tablets by mouth daily.      . sertraline (ZOLOFT) 50 MG tablet Take 1 tablet (50  mg total) by mouth daily. Take daily as directed. 30 tablet 5  . triamterene-hydrochlorothiazide (MAXZIDE-25) 37.5-25 MG tablet TAKE ONE-HALF TABLET BY MOUTH ONCE DAILY 45 tablet 1  . vitamin B-12 (CYANOCOBALAMIN) 100 MCG tablet Take 500 mcg by mouth daily.      No facility-administered medications prior to visit.     EXAM:  BP 136/80 mmHg  Temp(Src) 97.8 F (36.6 C) (Oral)  Ht 4' 11.5" (1.511 m)  Wt 152 lb 11.2 oz (69.264 kg)  BMI 30.34 kg/m2  Body mass index is 30.34 kg/(m^2).  GENERAL: vitals reviewed and listed above, alert, oriented, appears well hydrated and in no acute distress HEENT: atraumatic, conjunctiva  clear, no obvious abnormalities on inspection of external nose and ears NECK: no obvious masses on inspection palpation  LUNGS: clear to auscultation bilaterally, no wheezes, rales or rhonchi, good air movement CV: HRRR, no clubbing cyanosis or  peripheral edema nl cap refill  Abdomen:  Sof,t normal bowel sounds without hepatosplenomegaly, no guarding rebound or masses no CVA  tenderness MS: moves all extremities without noticeable focal  abnormality PSYCH: pleasant and cooperative,   Not depressed appearing  Lab Results  Component Value Date   WBC 8.7 07/10/2014   HGB 14.9 07/10/2014   HCT 45.3 07/10/2014   PLT 357.0 07/10/2014   GLUCOSE 98 07/10/2014   CHOL 166 07/10/2014   TRIG 101.0 07/10/2014   HDL 82.20 07/10/2014   LDLCALC 64 07/10/2014   ALT 14 07/10/2014   AST 16 07/10/2014   NA 140 07/10/2014   K 4.4 07/10/2014   CL 100 07/10/2014   CREATININE 1.11 07/10/2014   BUN 17 07/10/2014   CO2 30 07/10/2014   TSH 1.95 07/10/2014   HGBA1C 5.7 07/10/2014   MICROALBUR 1.6 04/26/2011   BP Readings from Last 3 Encounters:  04/07/15 136/80  03/08/15 120/70  11/16/14 132/78   Wt Readings from Last 3 Encounters:  04/07/15 152 lb 11.2 oz (69.264 kg)  03/08/15 151 lb (68.493 kg)  11/16/14 151 lb 4.8 oz (68.629 kg)   ASSESSMENT AND PLAN:  Discussed the following assessment and plan:  Adjustment reaction with anxiety and depression  Medication management - drowsi se  try to balance good bad effects  can try 37.5 mg at nigth   Functional GI symptoms  Gastroesophageal reflux disease, esophagitis presence not specified  Other allergic rhinitis - seasonal Plan labs at next visit  Wellness etc  Total visit 53mins > 50% spent counseling and coordinating care as indicated in above note and in instructions to patient .   -Patient advised to return or notify health care team  if symptoms worsen ,persist or new concerns arise.  Patient Instructions  Can try  Taking  Evening  Sertraline   25 mg dose.    If tolerated can try 37.5 mg   Would take 1.5 or the 25 mg   Or change  med altogether to citalopram .    Plan  Ov wellness  In 4-6 months  or as needed      Mariann Laster K. Livianna Petraglia M.D.

## 2015-04-07 NOTE — Patient Instructions (Addendum)
Can try  Taking  Evening  Sertraline   25 mg dose.    If tolerated can try 37.5 mg   Would take 1.5 or the 25 mg   Or change  med altogether to citalopram .    Plan  Ov wellness  In 4-6 months  or as needed

## 2015-04-15 DIAGNOSIS — H01003 Unspecified blepharitis right eye, unspecified eyelid: Secondary | ICD-10-CM | POA: Diagnosis not present

## 2015-04-15 DIAGNOSIS — H04123 Dry eye syndrome of bilateral lacrimal glands: Secondary | ICD-10-CM | POA: Diagnosis not present

## 2015-04-15 DIAGNOSIS — H40013 Open angle with borderline findings, low risk, bilateral: Secondary | ICD-10-CM | POA: Diagnosis not present

## 2015-04-27 ENCOUNTER — Other Ambulatory Visit: Payer: Self-pay | Admitting: Internal Medicine

## 2015-04-28 NOTE — Telephone Encounter (Signed)
Sent to the pharmacy by e-scribe. 

## 2015-07-26 ENCOUNTER — Other Ambulatory Visit: Payer: Self-pay | Admitting: Internal Medicine

## 2015-07-26 DIAGNOSIS — Z1231 Encounter for screening mammogram for malignant neoplasm of breast: Secondary | ICD-10-CM

## 2015-08-04 ENCOUNTER — Ambulatory Visit
Admission: RE | Admit: 2015-08-04 | Discharge: 2015-08-04 | Disposition: A | Discharge status: Home / Self Care | Payer: Medicare Other | Source: Ambulatory Visit | Attending: Internal Medicine | Admitting: Internal Medicine

## 2015-08-04 DIAGNOSIS — Z1231 Encounter for screening mammogram for malignant neoplasm of breast: Secondary | ICD-10-CM

## 2015-08-09 ENCOUNTER — Encounter: Payer: Self-pay | Admitting: Gastroenterology

## 2015-08-29 NOTE — Progress Notes (Signed)
Chief Complaint  Patient presents with  . Medicare Wellness    HPI: Caitlin [name] 70 y.o. comes in today for Preventive Medicare wellness visit .  And med management   nexium  : Alone before meals work some better but  Still has diarrhea  Some and bloating    Still "no nenergy "? If se med  Still on zoloft whtich has helped  Depression  No falls    GI symptoms a chronic problem see past history reflux loose stools. Also bloating. Health Maintenance  Topic Date Due  . INFLUENZA VACCINE  08/17/2015  . COLONOSCOPY  10/04/2015  . MAMMOGRAM  08/03/2017  . TETANUS/TDAP  10/26/2021  . DEXA SCAN  Completed  . ZOSTAVAX  Completed  . Hepatitis C Screening  Completed  . PNA vac Low Risk Adult  Completed   Health Maintenance Review LIFESTYLE:  Sugar beverages:rare Sleep:6 hr interrupted  Snore   osa not intervne needed  HH of 2     MEDICARE DOCUMENT QUESTIONS  TO SCAN  Hearing: ok  Vision:  No limitations at present . Last eye check UTD  Safety:  Has smoke detector and wears seat belts.  No firearms. No excess sun exposure. Sees dentist regularly.  Falls: n  Advance directive :  Reviewed  Has one.  Memory: Felt still doesn't think its right , no concern from her or her family.  Depression: No anhedonia unusual crying or depressive symptoms better  Still some hopelessness   Nutrition: Eats well balanced diet; adequate calcium and vitamin D. No swallowing chewing problems.  Injury: no major injuries in the last six months.  Other healthcare providers:  Reviewed today .  Social:  Lives with spouse married.   Preventive parameters: up-to-date  Reviewed   ADLS:   There are no problems or need for assistance  driving, feeding, obtaining food, dressing, toileting and bathing, managing money using phone. She is independent.     ROS:  GEN/ HEENT: No fever, significant weight changes sweats headaches vision problems hearing changes, CV/ PULM; No chest pain shortness of  breath cough, syncope,edema  change in exercise tolerance. GI /GU: No adominal pain, vomiting, change in bowel habits. No blood in the stool. No significant GU symptoms. SKIN/HEME: ,no acute skin rashes suspicious lesions or bleeding. No lymphadenopathy, nodules, masses.  NEURO/ PSYCH:  No neurologic signs such as weakness numbness. No depression anxiety. IMM/ Allergy: No unusual infections.  Allergy .   REST of 12 system review negative except as per HPI   Past Medical History:  Diagnosis Date  . Allergy   . Diverticulosis of colon   . Erythema nodosum    Tends to be seasonal has had a negative chest x-ray in the past negative PPD  . GERD (gastroesophageal reflux disease)    History of stricture and dilatation and Nissen surgery  . Hypertension   . Meniere's disease    ? dx per dr Marcelline Mates  . Mood disorder ([place])     Family History  Problem Relation Age of Onset  . Cancer Mother   . Depression Mother   . Depression Father   . COPD Sister     Deceased 2009-12-23  . Nephrolithiasis    . Dementia Brother     Social History   Social History  . Marital status: Married    Spouse name: N/A  . Number of children: 1  . Years of education: N/A   Occupational History  . Retired  Social History Main Topics  . Smoking status: Never Smoker  . Smokeless tobacco: Never Used  . Alcohol use No  . Drug use: No  . Sexual activity: Not Asked   Other Topics Concern  . None   Social History Narrative   Unemployed   Married   [employer] of 2    Has grandchildren out of state   No pets   Sis. died 2009/11/30 COPD    Outpatient Encounter Prescriptions as of 08/30/2015  Medication Sig  . calcium carbonate (TUMS EX) 750 MG chewable tablet Chew 1 tablet by mouth as needed for heartburn.  . cholecalciferol (VITAMIN D) 1000 UNITS tablet Take 2,000 Units by mouth daily.   [name] esomeprazole (NEXIUM) 20 MG capsule Take 20 mg by mouth daily at 12 noon. Reported on 03/08/2015  . Naproxen  Sodium 220 MG CAPS Take by mouth. Reported on 03/08/2015  . Phenylephrine-Acetaminophen 5-325 MG CAPS Take by mouth.  . potassium chloride (KLOR-CON M10) 10 MEQ tablet TAKE ONE TABLET BY MOUTH EVERY OTHER DAY WITH FLUID TABLET.  . Probiotic Product (PROBIOTIC PO) Take 2 tablets by mouth daily.    . sertraline (ZOLOFT) 25 MG tablet Take 1.5 tablets (37.5 mg total) by mouth daily. Do not do blister pack  . sertraline (ZOLOFT) 50 MG tablet Take 1 tablet (50 mg total) by mouth daily. Take daily as directed.  . triamterene-hydrochlorothiazide (MAXZIDE-25) 37.5-25 MG tablet TAKE ONE-HALF TABLET BY MOUTH ONCE DAILY  . vitamin B-12 (CYANOCOBALAMIN) 100 MCG tablet Take 500 mcg by mouth daily.   . [DISCONTINUED] minoxidil (ROGAINE) 2 % external solution Apply topically daily.    No facility-administered encounter medications on file as of 08/30/2015.     EXAM:  BP 136/84 (BP Location: Right Arm, Patient Position: Sitting, Cuff Size: Normal)   Temp 98.3 F (36.8 C) (Oral)   Ht 4' 11.5" (1.511 m)   Wt 148 lb 9.6 oz (67.4 kg)   BMI 29.51 kg/m   Body mass index is 29.51 kg/m.  Physical Exam: Vital signs reviewed TDV:VOHY is a well-developed well-nourished alert cooperative   who appears stated age in no acute distress.  HEENT: normocephalic atraumatic , Eyes: PERRL EOM's full, conjunctiva clear, Nares: paten,t no deformity discharge or tenderness., Ears: no deformity EAC's clear TMs with normal landmarks. Mouth: clear OP, no lesions, edema.  Moist mucous membranes. Dentition in adequate repair. NECK: supple without masses, thyromegaly or bruits. CHEST/PULM:  Clear to auscultation and percussion breath sounds equal no wheeze , rales or rhonchi. No chest wall deformities or tenderness. CV: PMI is nondisplaced, S1 S2 no gallops, murmurs, rubs. Peripheral pulses are full without delay.No JVD .  ABDOMEN: Bowel sounds normal nontender  No guard or rebound, no hepato splenomegal no CVA tenderness.     Extremtities:  No clubbing cyanosis or edema, no acute joint swelling or redness no focal atrophy NEURO:  Oriented x3, cranial nerves 3-12 appear to be intact, no obvious focal weakness,gait within normal limits no abnormal reflexes or asymmetrical SKIN: No acute rashes normal turgor, color, no bruising or petechiae. PSYCH: Oriented, good eye contact, no obvious mild flat affect  anxiety, cognition and judgment appear normal. LN: no cervical axillary inguinal adenopathy No obvious noted deficits in memory, attention, and speech.   Lab Results  Component Value Date   WBC 7.8 08/30/2015   HGB 14.5 08/30/2015   HCT 43.1 08/30/2015   PLT 271.0 08/30/2015   GLUCOSE 96 08/30/2015   CHOL 166 07/10/2014  TRIG 101.0 07/10/2014   HDL 82.20 07/10/2014   LDLCALC 64 07/10/2014   ALT 24 08/30/2015   AST 23 08/30/2015   NA 140 08/30/2015   K 3.9 08/30/2015   CL 102 08/30/2015   CREATININE 0.98 08/30/2015   BUN 16 08/30/2015   CO2 30 08/30/2015   TSH 2.39 08/30/2015   HGBA1C 5.8 08/30/2015   MICROALBUR 1.6 04/26/2011   BP Readings from Last 3 Encounters:  08/30/15 136/84  04/07/15 136/80  03/08/15 120/70   Wt Readings from Last 3 Encounters:  08/30/15 148 lb 9.6 oz (67.4 kg)  04/07/15 152 lb 11.2 oz (69.3 kg)  03/08/15 151 lb (68.5 kg)    ASSESSMENT AND PLAN:  Discussed the following assessment and plan:  Medicare annual wellness visit, subsequent  Medication management - Plan: Basic metabolic panel, CBC with Differential/Platelet, Hemoglobin A1c, Hepatic function panel, TSH, Vitamin B12, Sedimentation rate, C-reactive protein  Fasting hyperglycemia - Plan: Basic metabolic panel, CBC with Differential/Platelet, Hemoglobin A1c, Hepatic function panel, TSH, Vitamin B12  Gastroesophageal reflux disease, esophagitis presence not specified - Plan: Basic metabolic panel, CBC with Differential/Platelet, Hemoglobin A1c, Hepatic function panel, TSH, Vitamin B12  Depression - Plan:  Basic metabolic panel, CBC with Differential/Platelet, Hemoglobin A1c, Hepatic function panel, TSH, Vitamin B12  Other fatigue - Plan: Basic metabolic panel, CBC with Differential/Platelet, Hemoglobin A1c, Hepatic function panel, TSH, Vitamin B12, Sedimentation rate, C-reactive protein  Memory difficulties - Plan: TSH, Vitamin B12  Moderate single current episode of major depressive disorder ([place]) Counseled. About  Depression and fu  Can try  75 mg sertraline in case adding tofaitue  . Difficult in her situation to decide whether medicine is helping her depression causing fatigue etc. Consider other options if needed. Patient Care Team: Burnis Medin, MD as PCP - General Thornell Sartorius, MD Lafayette Dragon, MD (Gastroenterology) Jarome Matin, MD as Consulting Physician (Dermatology) Monna Fam, MD as Attending Physician (Ophthalmology) Elsie Saas, MD as Attending Physician (Orthopedic Surgery) Sydnee Levans, MD as Consulting Physician (Dermatology)  Patient Instructions  Will notify you  of labs when available.  if all ok then can change  Sertraline to either lexapro or citalopram and see if helps  The fatigue and still helps the  Dep anxiety.  try florastor or culturelle type of   Probiotic to see if helps better .   Then plan rov     3 -4 weeks after changing.   Health Maintenance, Female Adopting a healthy lifestyle and getting preventive care can go a long way to promote health and wellness. Talk with your health care provider about what schedule of regular examinations is right for you. This is a good chance for you to check in with your provider about disease prevention and staying healthy. In between checkups, there are plenty of things you can do on your own. Experts have done a lot of research about which lifestyle changes and preventive measures are most likely to keep you healthy. Ask your health care provider for more information. WEIGHT AND DIET  Eat a healthy  diet  Be sure to include plenty of vegetables, fruits, low-fat dairy products, and lean protein.  Do not eat a lot of foods high in solid fats, added sugars, or salt.  Get regular exercise. This is one of the most important things you can do for your health.  Most adults should exercise for at least 150 minutes each week. The exercise should increase your heart rate and make you sweat (  moderate-intensity exercise).  Most adults should also do strengthening exercises at least twice a week. This is in addition to the moderate-intensity exercise.  Maintain a healthy weight  Body mass index (BMI) is a measurement that can be used to identify possible weight problems. It estimates body fat based on height and weight. Your health care provider can help determine your BMI and help you achieve or maintain a healthy weight.  For females 24 years of age and older:   A BMI below 18.5 is considered underweight.  A BMI of 18.5 to 24.9 is normal.  A BMI of 25 to 29.9 is considered overweight.  A BMI of 30 and above is considered obese.  Watch levels of cholesterol and blood lipids  You should start having your blood tested for lipids and cholesterol at 69 years of age, then have this test every 5 years.  You may need to have your cholesterol levels checked more often if:  Your lipid or cholesterol levels are high.  You are older than 69 years of age.  You are at high risk for heart disease.  CANCER SCREENING   Lung Cancer  Lung cancer screening is recommended for adults 33-94 years old who are at high risk for lung cancer because of a history of smoking.  A yearly low-dose CT scan of the lungs is recommended for people who:  Currently smoke.  Have quit within the past 15 years.  Have at least a 30-pack-year history of smoking. A pack year is smoking an average of one pack of cigarettes a day for 1 year.  Yearly screening should continue until it has been 15 years since you  quit.  Yearly screening should stop if you develop a health problem that would prevent you from having lung cancer treatment.  Breast Cancer  Practice breast self-awareness. This means understanding how your breasts normally appear and feel.  It also means doing regular breast self-exams. Let your health care provider know about any changes, no matter how small.  If you are in your 20s or 30s, you should have a clinical breast exam (CBE) by a health care provider every 1-3 years as part of a regular health exam.  If you are 51 or older, have a CBE every year. Also consider having a breast X-ray (mammogram) every year.  If you have a family history of breast cancer, talk to your health care provider about genetic screening.  If you are at high risk for breast cancer, talk to your health care provider about having an MRI and a mammogram every year.  Breast cancer gene (BRCA) assessment is recommended for women who have family members with BRCA-related cancers. BRCA-related cancers include:  Breast.  Ovarian.  Tubal.  Peritoneal cancers.  Results of the assessment will determine the need for genetic counseling and BRCA1 and BRCA2 testing. Cervical Cancer Your health care provider may recommend that you be screened regularly for cancer of the pelvic organs (ovaries, uterus, and vagina). This screening involves a pelvic examination, including checking for microscopic changes to the surface of your cervix (Pap test). You may be encouraged to have this screening done every 3 years, beginning at age 69.  For women ages 55-65, health care providers may recommend pelvic exams and Pap testing every 3 years, or they may recommend the Pap and pelvic exam, combined with testing for human papilloma virus (HPV), every 5 years. Some types of HPV increase your risk of cervical cancer. Testing for HPV may also  be done on women of any age with unclear Pap test results.  Other health care providers may  not recommend any screening for nonpregnant women who are considered low risk for pelvic cancer and who do not have symptoms. Ask your health care provider if a screening pelvic exam is right for you.  If you have had past treatment for cervical cancer or a condition that could lead to cancer, you need Pap tests and screening for cancer for at least 20 years after your treatment. If Pap tests have been discontinued, your risk factors (such as having a new sexual partner) need to be reassessed to determine if screening should resume. Some women have medical problems that increase the chance of getting cervical cancer. In these cases, your health care provider may recommend more frequent screening and Pap tests. Colorectal Cancer  This type of cancer can be detected and often prevented.  Routine colorectal cancer screening usually begins at 69 years of age and continues through 69 years of age.  Your health care provider may recommend screening at an earlier age if you have risk factors for colon cancer.  Your health care provider may also recommend using home test kits to check for hidden blood in the stool.  A small camera at the end of a tube can be used to examine your colon directly (sigmoidoscopy or colonoscopy). This is done to check for the earliest forms of colorectal cancer.  Routine screening usually begins at age 73.  Direct examination of the colon should be repeated every 5-10 years through 69 years of age. However, you may need to be screened more often if early forms of precancerous polyps or small growths are found. Skin Cancer  Check your skin from head to toe regularly.  Tell your health care provider about any new moles or changes in moles, especially if there is a change in a mole's shape or color.  Also tell your health care provider if you have a mole that is larger than the size of a pencil eraser.  Always use sunscreen. Apply sunscreen liberally and repeatedly  throughout the day.  Protect yourself by wearing long sleeves, pants, a wide-brimmed hat, and sunglasses whenever you are outside. HEART DISEASE, DIABETES, AND HIGH BLOOD PRESSURE   High blood pressure causes heart disease and increases the risk of stroke. High blood pressure is more likely to develop in:  People who have blood pressure in the high end of the normal range (130-139/85-89 mm Hg).  People who are overweight or obese.  People who are African American.  If you are 2-34 years of age, have your blood pressure checked every 3-5 years. If you are 49 years of age or older, have your blood pressure checked every year. You should have your blood pressure measured twice--once when you are at a hospital or clinic, and once when you are not at a hospital or clinic. Record the average of the two measurements. To check your blood pressure when you are not at a hospital or clinic, you can use:  An automated blood pressure machine at a pharmacy.  A home blood pressure monitor.  If you are between 16 years and 68 years old, ask your health care provider if you should take aspirin to prevent strokes.  Have regular diabetes screenings. This involves taking a blood sample to check your fasting blood sugar level.  If you are at a normal weight and have a low risk for diabetes, have this test once  every three years after 69 years of age.  If you are overweight and have a high risk for diabetes, consider being tested at a younger age or more often. PREVENTING INFECTION  Hepatitis B  If you have a higher risk for hepatitis B, you should be screened for this virus. You are considered at high risk for hepatitis B if:  You were born in a country where hepatitis B is common. Ask your health care provider which countries are considered high risk.  Your parents were born in a high-risk country, and you have not been immunized against hepatitis B (hepatitis B vaccine).  You have HIV or  AIDS.  You use needles to inject street drugs.  You live with someone who has hepatitis B.  You have had sex with someone who has hepatitis B.  You get hemodialysis treatment.  You take certain medicines for conditions, including cancer, organ transplantation, and autoimmune conditions. Hepatitis C  Blood testing is recommended for:  Everyone born from [birth year] through 1964.  Anyone with known risk factors for hepatitis C. Sexually transmitted infections (STIs)  You should be screened for sexually transmitted infections (STIs) including gonorrhea and chlamydia if:  You are sexually active and are younger than 69 years of age.  You are older than 69 years of age and your health care provider tells you that you are at risk for this type of infection.  Your sexual activity has changed since you were last screened and you are at an increased risk for chlamydia or gonorrhea. Ask your health care provider if you are at risk.  If you do not have HIV, but are at risk, it may be recommended that you take a prescription medicine daily to prevent HIV infection. This is called pre-exposure prophylaxis (PrEP). You are considered at risk if:  You are sexually active and do not regularly use condoms or know the HIV status of your partner(s).  You take drugs by injection.  You are sexually active with a partner who has HIV. Talk with your health care provider about whether you are at high risk of being infected with HIV. If you choose to begin PrEP, you should first be tested for HIV. You should then be tested every 3 months for as long as you are taking PrEP.  PREGNANCY   If you are premenopausal and you may become pregnant, ask your health care provider about preconception counseling.  If you may become pregnant, take 400 to 800 micrograms (mcg) of folic acid every day.  If you want to prevent pregnancy, talk to your health care provider about birth control (contraception). OSTEOPOROSIS AND  MENOPAUSE   Osteoporosis is a disease in which the bones lose minerals and strength with aging. This can result in serious bone fractures. Your risk for osteoporosis can be identified using a bone density scan.  If you are 49 years of age or older, or if you are at risk for osteoporosis and fractures, ask your health care provider if you should be screened.  Ask your health care provider whether you should take a calcium or vitamin D supplement to lower your risk for osteoporosis.  Menopause may have certain physical symptoms and risks.  Hormone replacement therapy may reduce some of these symptoms and risks. Talk to your health care provider about whether hormone replacement therapy is right for you.  HOME CARE INSTRUCTIONS   Schedule regular health, dental, and eye exams.  Stay current with your immunizations.   Do not  use any tobacco products including cigarettes, chewing tobacco, or electronic cigarettes.  If you are pregnant, do not drink alcohol.  If you are breastfeeding, limit how much and how often you drink alcohol.  Limit alcohol intake to no more than 1 drink per day for nonpregnant women. One drink equals 12 ounces of beer, 5 ounces of wine, or 1 ounces of hard liquor.  Do not use street drugs.  Do not share needles.  Ask your health care provider for help if you need support or information about quitting drugs.  Tell your health care provider if you often feel depressed.  Tell your health care provider if you have ever been abused or do not feel safe at home.   This information is not intended to replace advice given to you by your health care provider. Make sure you discuss any questions you have with your health care provider.   Document Released: 07/18/2010 Document Revised: 01/23/2014 Document Reviewed: 12/04/2012 Elsevier Interactive Patient Education 2016 [place] K. Panosh M.D.

## 2015-08-30 ENCOUNTER — Encounter: Payer: Self-pay | Admitting: Internal Medicine

## 2015-08-30 ENCOUNTER — Ambulatory Visit (INDEPENDENT_AMBULATORY_CARE_PROVIDER_SITE_OTHER): Payer: Medicare Other | Admitting: Internal Medicine

## 2015-08-30 VITALS — BP 136/84 | Temp 98.3°F | Ht 59.5 in | Wt 148.6 lb

## 2015-08-30 DIAGNOSIS — Z0000 Encounter for general adult medical examination without abnormal findings: Secondary | ICD-10-CM | POA: Diagnosis not present

## 2015-08-30 DIAGNOSIS — F32A Depression, unspecified: Secondary | ICD-10-CM

## 2015-08-30 DIAGNOSIS — R413 Other amnesia: Secondary | ICD-10-CM

## 2015-08-30 DIAGNOSIS — F329 Major depressive disorder, single episode, unspecified: Secondary | ICD-10-CM | POA: Diagnosis not present

## 2015-08-30 DIAGNOSIS — K219 Gastro-esophageal reflux disease without esophagitis: Secondary | ICD-10-CM | POA: Diagnosis not present

## 2015-08-30 DIAGNOSIS — R5383 Other fatigue: Secondary | ICD-10-CM

## 2015-08-30 DIAGNOSIS — R7301 Impaired fasting glucose: Secondary | ICD-10-CM | POA: Diagnosis not present

## 2015-08-30 DIAGNOSIS — Z79899 Other long term (current) drug therapy: Secondary | ICD-10-CM

## 2015-08-30 DIAGNOSIS — F321 Major depressive disorder, single episode, moderate: Secondary | ICD-10-CM

## 2015-08-30 LAB — BASIC METABOLIC PANEL
BUN: 16 mg/dL (ref 6–23)
CALCIUM: 9.9 mg/dL (ref 8.4–10.5)
CO2: 30 meq/L (ref 19–32)
CREATININE: 0.98 mg/dL (ref 0.40–1.20)
Chloride: 102 mEq/L (ref 96–112)
GFR: 59.72 mL/min — ABNORMAL LOW (ref >60.00)
Glucose, Bld: 96 mg/dL (ref 70–99)
Potassium: 3.9 mEq/L (ref 3.5–5.1)
Sodium: 140 mEq/L (ref 135–145)

## 2015-08-30 LAB — CBC WITH DIFFERENTIAL/PLATELET
BASOS ABS: 0 10*3/uL (ref 0.0–0.1)
Basophils Relative: 0.6 % (ref 0.0–3.0)
EOS ABS: 0.2 10*3/uL (ref 0.0–0.7)
Eosinophils Relative: 2.8 % (ref 0.0–5.0)
HEMATOCRIT: 43.1 % (ref 36.0–46.0)
Hemoglobin: 14.5 g/dL (ref 12.0–15.0)
LYMPHS ABS: 1.9 10*3/uL (ref 0.7–4.0)
LYMPHS PCT: 23.8 % (ref 12.0–46.0)
MCHC: 33.7 g/dL (ref 30.0–36.0)
MCV: 82.7 fl (ref 78.0–100.0)
MONO ABS: 0.4 10*3/uL (ref 0.1–1.0)
Monocytes Relative: 4.8 % (ref 3.0–12.0)
NEUTROS ABS: 5.3 10*3/uL (ref 1.4–7.7)
NEUTROS PCT: 68 % (ref 43.0–77.0)
PLATELETS: 271 10*3/uL (ref 150.0–400.0)
RBC: 5.22 Mil/uL — ABNORMAL HIGH (ref 3.87–5.11)
RDW: 15.4 % (ref 11.5–15.5)
WBC: 7.8 10*3/uL (ref 4.0–10.5)

## 2015-08-30 LAB — VITAMIN B12: VITAMIN B 12: 1187 pg/mL — AB (ref 211–911)

## 2015-08-30 LAB — HEPATIC FUNCTION PANEL
ALT: 24 U/L (ref 0–35)
AST: 23 U/L (ref 0–37)
Albumin: 4.5 g/dL (ref 3.5–5.2)
Alkaline Phosphatase: 60 U/L (ref 39–117)
BILIRUBIN DIRECT: 0.1 mg/dL (ref 0.0–0.3)
BILIRUBIN TOTAL: 0.6 mg/dL (ref 0.2–1.2)
Total Protein: 6.7 g/dL (ref 6.0–8.3)

## 2015-08-30 LAB — TSH: TSH: 2.39 u[IU]/mL (ref 0.35–4.50)

## 2015-08-30 LAB — SEDIMENTATION RATE: SED RATE: 21 mm/h (ref 0–30)

## 2015-08-30 LAB — C-REACTIVE PROTEIN: CRP: 1.3 mg/dL (ref 0.5–20.0)

## 2015-08-30 LAB — HEMOGLOBIN A1C: HEMOGLOBIN A1C: 5.8 % (ref 4.6–6.5)

## 2015-08-30 NOTE — Patient Instructions (Addendum)
Will notify you  of labs when available.  if all ok then can change  Sertraline to either lexapro or citalopram and see if helps  The fatigue and still helps the  Dep anxiety.  try florastor or culturelle type of   Probiotic to see if helps better .   Then plan rov     3 -4 weeks after changing.   Health Maintenance, Female Adopting a healthy lifestyle and getting preventive care can go a long way to promote health and wellness. Talk with your health care provider about what schedule of regular examinations is right for you. This is a good chance for you to check in with your provider about disease prevention and staying healthy. In between checkups, there are plenty of things you can do on your own. Experts have done a lot of research about which lifestyle changes and preventive measures are most likely to keep you healthy. Ask your health care provider for more information. WEIGHT AND DIET  Eat a healthy diet  Be sure to include plenty of vegetables, fruits, low-fat dairy products, and lean protein.  Do not eat a lot of foods high in solid fats, added sugars, or salt.  Get regular exercise. This is one of the most important things you can do for your health.  Most adults should exercise for at least 150 minutes each week. The exercise should increase your heart rate and make you sweat (moderate-intensity exercise).  Most adults should also do strengthening exercises at least twice a week. This is in addition to the moderate-intensity exercise.  Maintain a healthy weight  Body mass index (BMI) is a measurement that can be used to identify possible weight problems. It estimates body fat based on height and weight. Your health care provider can help determine your BMI and help you achieve or maintain a healthy weight.  For females 64 years of age and older:   A BMI below 18.5 is considered underweight.  A BMI of 18.5 to 24.9 is normal.  A BMI of 25 to 29.9 is considered  overweight.  A BMI of 30 and above is considered obese.  Watch levels of cholesterol and blood lipids  You should start having your blood tested for lipids and cholesterol at 69 years of age, then have this test every 5 years.  You may need to have your cholesterol levels checked more often if:  Your lipid or cholesterol levels are high.  You are older than 69 years of age.  You are at high risk for heart disease.  CANCER SCREENING   Lung Cancer  Lung cancer screening is recommended for adults 66-69 years old who are at high risk for lung cancer because of a history of smoking.  A yearly low-dose CT scan of the lungs is recommended for people who:  Currently smoke.  Have quit within the past 15 years.  Have at least a 30-pack-year history of smoking. A pack year is smoking an average of one pack of cigarettes a day for 1 year.  Yearly screening should continue until it has been 15 years since you quit.  Yearly screening should stop if you develop a health problem that would prevent you from having lung cancer treatment.  Breast Cancer  Practice breast self-awareness. This means understanding how your breasts normally appear and feel.  It also means doing regular breast self-exams. Let your health care provider know about any changes, no matter how small.  If you are in your 46s  or 29s, you should have a clinical breast exam (CBE) by a health care provider every 1-3 years as part of a regular health exam.  If you are 54 or older, have a CBE every year. Also consider having a breast X-ray (mammogram) every year.  If you have a family history of breast cancer, talk to your health care provider about genetic screening.  If you are at high risk for breast cancer, talk to your health care provider about having an MRI and a mammogram every year.  Breast cancer gene (BRCA) assessment is recommended for women who have family members with BRCA-related cancers. BRCA-related  cancers include:  Breast.  Ovarian.  Tubal.  Peritoneal cancers.  Results of the assessment will determine the need for genetic counseling and BRCA1 and BRCA2 testing. Cervical Cancer Your health care provider may recommend that you be screened regularly for cancer of the pelvic organs (ovaries, uterus, and vagina). This screening involves a pelvic examination, including checking for microscopic changes to the surface of your cervix (Pap test). You may be encouraged to have this screening done every 3 years, beginning at age 41.  For women ages 49-65, health care providers may recommend pelvic exams and Pap testing every 3 years, or they may recommend the Pap and pelvic exam, combined with testing for human papilloma virus (HPV), every 5 years. Some types of HPV increase your risk of cervical cancer. Testing for HPV may also be done on women of any age with unclear Pap test results.  Other health care providers may not recommend any screening for nonpregnant women who are considered low risk for pelvic cancer and who do not have symptoms. Ask your health care provider if a screening pelvic exam is right for you.  If you have had past treatment for cervical cancer or a condition that could lead to cancer, you need Pap tests and screening for cancer for at least 20 years after your treatment. If Pap tests have been discontinued, your risk factors (such as having a new sexual partner) need to be reassessed to determine if screening should resume. Some women have medical problems that increase the chance of getting cervical cancer. In these cases, your health care provider may recommend more frequent screening and Pap tests. Colorectal Cancer  This type of cancer can be detected and often prevented.  Routine colorectal cancer screening usually begins at 69 years of age and continues through 69 years of age.  Your health care provider may recommend screening at an earlier age if you have risk  factors for colon cancer.  Your health care provider may also recommend using home test kits to check for hidden blood in the stool.  A small camera at the end of a tube can be used to examine your colon directly (sigmoidoscopy or colonoscopy). This is done to check for the earliest forms of colorectal cancer.  Routine screening usually begins at age 19.  Direct examination of the colon should be repeated every 5-10 years through 69 years of age. However, you may need to be screened more often if early forms of precancerous polyps or small growths are found. Skin Cancer  Check your skin from head to toe regularly.  Tell your health care provider about any new moles or changes in moles, especially if there is a change in a mole's shape or color.  Also tell your health care provider if you have a mole that is larger than the size of a pencil eraser.  Always use sunscreen. Apply sunscreen liberally and repeatedly throughout the day.  Protect yourself by wearing long sleeves, pants, a wide-brimmed hat, and sunglasses whenever you are outside. HEART DISEASE, DIABETES, AND HIGH BLOOD PRESSURE   High blood pressure causes heart disease and increases the risk of stroke. High blood pressure is more likely to develop in:  People who have blood pressure in the high end of the normal range (130-139/85-89 mm Hg).  People who are overweight or obese.  People who are African American.  If you are 13-40 years of age, have your blood pressure checked every 3-5 years. If you are 37 years of age or older, have your blood pressure checked every year. You should have your blood pressure measured twice--once when you are at a hospital or clinic, and once when you are not at a hospital or clinic. Record the average of the two measurements. To check your blood pressure when you are not at a hospital or clinic, you can use:  An automated blood pressure machine at a pharmacy.  A home blood pressure  monitor.  If you are between 39 years and 80 years old, ask your health care provider if you should take aspirin to prevent strokes.  Have regular diabetes screenings. This involves taking a blood sample to check your fasting blood sugar level.  If you are at a normal weight and have a low risk for diabetes, have this test once every three years after 69 years of age.  If you are overweight and have a high risk for diabetes, consider being tested at a younger age or more often. PREVENTING INFECTION  Hepatitis B  If you have a higher risk for hepatitis B, you should be screened for this virus. You are considered at high risk for hepatitis B if:  You were born in a country where hepatitis B is common. Ask your health care provider which countries are considered high risk.  Your parents were born in a high-risk country, and you have not been immunized against hepatitis B (hepatitis B vaccine).  You have HIV or AIDS.  You use needles to inject street drugs.  You live with someone who has hepatitis B.  You have had sex with someone who has hepatitis B.  You get hemodialysis treatment.  You take certain medicines for conditions, including cancer, organ transplantation, and autoimmune conditions. Hepatitis C  Blood testing is recommended for:  Everyone born from [birth year] through 1964.  Anyone with known risk factors for hepatitis C. Sexually transmitted infections (STIs)  You should be screened for sexually transmitted infections (STIs) including gonorrhea and chlamydia if:  You are sexually active and are younger than 69 years of age.  You are older than 69 years of age and your health care provider tells you that you are at risk for this type of infection.  Your sexual activity has changed since you were last screened and you are at an increased risk for chlamydia or gonorrhea. Ask your health care provider if you are at risk.  If you do not have HIV, but are at risk, it may be  recommended that you take a prescription medicine daily to prevent HIV infection. This is called pre-exposure prophylaxis (PrEP). You are considered at risk if:  You are sexually active and do not regularly use condoms or know the HIV status of your partner(s).  You take drugs by injection.  You are sexually active with a partner who has HIV. Talk with your health  care provider about whether you are at high risk of being infected with HIV. If you choose to begin PrEP, you should first be tested for HIV. You should then be tested every 3 months for as long as you are taking PrEP.  PREGNANCY   If you are premenopausal and you may become pregnant, ask your health care provider about preconception counseling.  If you may become pregnant, take 400 to 800 micrograms (mcg) of folic acid every day.  If you want to prevent pregnancy, talk to your health care provider about birth control (contraception). OSTEOPOROSIS AND MENOPAUSE   Osteoporosis is a disease in which the bones lose minerals and strength with aging. This can result in serious bone fractures. Your risk for osteoporosis can be identified using a bone density scan.  If you are 34 years of age or older, or if you are at risk for osteoporosis and fractures, ask your health care provider if you should be screened.  Ask your health care provider whether you should take a calcium or vitamin D supplement to lower your risk for osteoporosis.  Menopause may have certain physical symptoms and risks.  Hormone replacement therapy may reduce some of these symptoms and risks. Talk to your health care provider about whether hormone replacement therapy is right for you.  HOME CARE INSTRUCTIONS   Schedule regular health, dental, and eye exams.  Stay current with your immunizations.   Do not use any tobacco products including cigarettes, chewing tobacco, or electronic cigarettes.  If you are pregnant, do not drink alcohol.  If you are  breastfeeding, limit how much and how often you drink alcohol.  Limit alcohol intake to no more than 1 drink per day for nonpregnant women. One drink equals 12 ounces of beer, 5 ounces of wine, or 1 ounces of hard liquor.  Do not use street drugs.  Do not share needles.  Ask your health care provider for help if you need support or information about quitting drugs.  Tell your health care provider if you often feel depressed.  Tell your health care provider if you have ever been abused or do not feel safe at home.   This information is not intended to replace advice given to you by your health care provider. Make sure you discuss any questions you have with your health care provider.   Document Released: 07/18/2010 Document Revised: 01/23/2014 Document Reviewed: 12/04/2012 Elsevier Interactive Patient Education [employer].

## 2015-09-07 ENCOUNTER — Other Ambulatory Visit: Payer: Self-pay | Admitting: Internal Medicine

## 2015-09-09 NOTE — Telephone Encounter (Signed)
Sent to the pharmacy by e-scribe. 

## 2015-09-10 ENCOUNTER — Telehealth: Payer: Self-pay | Admitting: Internal Medicine

## 2015-09-10 NOTE — Telephone Encounter (Signed)
Spoke to the pt.  She is interested in changing from Zoloft to another medication.  Says the Zoloft makes her sleepy.  Interested in citalopram.  Also needs to know when she should come in for appointment.  Please advise.  Thanks!!!

## 2015-09-10 NOTE — Telephone Encounter (Signed)
° °  Pt call to ask if there will any changes in her meds since there were a few issues with her labs

## 2015-09-10 NOTE — Telephone Encounter (Signed)
Ok to switch decrease the  sertraline to 25 mg   mg for 10 days  week  And then change  To celexa  10 mg per day   Disp 30    May feels transition for a week or so   Then  rov after 3 weeks . On the celexa

## 2015-09-13 MED ORDER — CITALOPRAM HYDROBROMIDE 10 MG PO TABS: 10.0000 mg | ORAL_TABLET | Freq: Every day | ORAL | 0 refills | Status: DC

## 2015-09-13 NOTE — Telephone Encounter (Signed)
Spoke to the pt.  Advised to decrease sertraline to 25 mg for 10 days.  Pt says she has been on 25 mg for two days.  Advised another 8 day for 10 days total. Confirmed with pt that she has enough sertraline for another 8 days. Celexa sent to the pharmacy by e-scribe.  Pt also made future follow up appt for 10/01/15 @ 1:15 PM.

## 2015-10-01 ENCOUNTER — Ambulatory Visit (INDEPENDENT_AMBULATORY_CARE_PROVIDER_SITE_OTHER): Payer: Medicare Other | Admitting: Internal Medicine

## 2015-10-01 ENCOUNTER — Encounter: Payer: Self-pay | Admitting: Internal Medicine

## 2015-10-01 VITALS — BP 138/82 | Temp 98.2°F | Wt 150.8 lb

## 2015-10-01 DIAGNOSIS — K219 Gastro-esophageal reflux disease without esophagitis: Secondary | ICD-10-CM

## 2015-10-01 DIAGNOSIS — F329 Major depressive disorder, single episode, unspecified: Secondary | ICD-10-CM | POA: Diagnosis not present

## 2015-10-01 DIAGNOSIS — F4323 Adjustment disorder with mixed anxiety and depressed mood: Secondary | ICD-10-CM | POA: Diagnosis not present

## 2015-10-01 DIAGNOSIS — Z79899 Other long term (current) drug therapy: Secondary | ICD-10-CM | POA: Diagnosis not present

## 2015-10-01 DIAGNOSIS — R5383 Other fatigue: Secondary | ICD-10-CM | POA: Diagnosis not present

## 2015-10-01 DIAGNOSIS — Z23 Encounter for immunization: Secondary | ICD-10-CM | POA: Diagnosis not present

## 2015-10-01 DIAGNOSIS — K30 Functional dyspepsia: Secondary | ICD-10-CM

## 2015-10-01 DIAGNOSIS — R198 Other specified symptoms and signs involving the digestive system and abdomen: Secondary | ICD-10-CM

## 2015-10-01 DIAGNOSIS — F32A Depression, unspecified: Secondary | ICD-10-CM

## 2015-10-01 NOTE — Patient Instructions (Signed)
Stay on the same  Dose of citalopram  10 mg per day .      Give more time  . considier increase to 15 mg  .  Look into   Water exercise  movement .     To help with the pain and energy .  Consideration of  Seeing sleep specialist again    In case sleep dx adding to your  fatigue .    ROV in  4-6 weeks

## 2015-10-01 NOTE — Progress Notes (Signed)
Pre visit review using our clinic review tool, if applicable. No additional management support is needed unless otherwise documented below in the visit note.  Chief Complaint  Patient presents with  . Follow-up    HPI: LAVEYA REHRER 70 y.o.  comesin for fu of med evaluation needs flu vaccine   Changing medication .   From sertraline   Now on   Not as drowsy   .   Depression anxiety  Not worse    Depression   Cant tell lessin   Gi  Still and issue .  Changes  The probiotic .  Taking nexium earlier pre meal seems to help hb some   Sleep eval  Remotely over 8 years ago and doesn't want to do sleep study if no bathroom access.   Snores sometime    No worse with anxiety depr  Seems ok  On med for 2 weeks.    Biggest issues are waking in am stiff and hurts all over ( told FM) and not motivated to gt a lot done.   Consider water  Exercise but  Can function too early in am .     ROS: See pertinent positives and negatives per HPI.  Past Medical History:  Diagnosis Date  . Allergy   . Diverticulosis of colon   . Erythema nodosum    Tends to be seasonal has had a negative chest x-ray in the past negative PPD  . GERD (gastroesophageal reflux disease)    History of stricture and dilatation and Nissen surgery  . Hypertension   . Meniere's disease    ? dx per dr Marcelline Mates  . Mood disorder ([place])     Family History  Problem Relation Age of Onset  . Cancer Mother   . Depression Mother   . Depression Father   . COPD Sister     Deceased 12/22/2009  . Nephrolithiasis    . Dementia Brother     Social History   Social History  . Marital status: Married    Spouse name: N/A  . Number of children: 1  . Years of education: N/A   Occupational History  . Retired    Social History Main Topics  . Smoking status: Never Smoker  . Smokeless tobacco: Never Used  . Alcohol use No  . Drug use: No  . Sexual activity: Not Asked   Other Topics Concern  . None   Social History  Narrative   Unemployed   Married   [place] of 2    Has grandchildren out of state   No pets   Sis. died 12-22-09 COPD    Outpatient Medications Prior to Visit  Medication Sig Dispense Refill  . calcium carbonate (TUMS EX) 750 MG chewable tablet Chew 1 tablet by mouth as needed for heartburn.    . cholecalciferol (VITAMIN D) 1000 UNITS tablet Take 2,000 Units by mouth daily.     . citalopram (CELEXA) 10 MG tablet Take 1 tablet (10 mg total) by mouth daily. 30 tablet 0  . esomeprazole (NEXIUM) 20 MG capsule Take 20 mg by mouth daily at 12 noon. Reported on 03/08/2015    . KLOR-CON M10 10 MEQ tablet  TAKE 1 TABLET BY MOUTH EVERY OTHER DAY WITH FLUID TABLET 45 tablet 2  . Naproxen Sodium 220 MG CAPS Take by mouth. Reported on 03/08/2015    . Phenylephrine-Acetaminophen 5-325 MG CAPS Take by mouth.    . Probiotic Product (PROBIOTIC PO) Take 1 tablet  by mouth daily.     [name] triamterene-hydrochlorothiazide (MAXZIDE-25) 37.5-25 MG tablet TAKE ONE-HALF TABLET BY MOUTH ONCE DAILY 45 tablet 1  . vitamin B-12 (CYANOCOBALAMIN) 100 MCG tablet Take 500 mcg by mouth daily.     . sertraline (ZOLOFT) 25 MG tablet Take 1.5 tablets (37.5 mg total) by mouth daily. Do not do blister pack 45 tablet 3  . sertraline (ZOLOFT) 50 MG tablet Take 1 tablet (50 mg total) by mouth daily. Take daily as directed. 30 tablet 5   No facility-administered medications prior to visit.      EXAM:  BP 138/82 (BP Location: Right Arm, Patient Position: Sitting, Cuff Size: Normal)   Temp 98.2 F (36.8 C) (Oral)   Wt 150 lb 12.8 oz (68.4 kg)   BMI 29.95 kg/m   Body mass index is 29.95 kg/m.  GENERAL: vitals reviewed and listed above, alert, oriented, appears well hydrated and in no acute distress HEENT: atraumatic, conjunctiva  clear, no obvious abnormalities on inspection of external nose and ears MS: moves all extremities without noticeable focal  abnormality PSYCH: pleasant and cooperative, y mor animated    Strategies  advised Lab Results  Component Value Date   WBC 7.8 08/30/2015   HGB 14.5 08/30/2015   HCT 43.1 08/30/2015   PLT 271.0 08/30/2015   GLUCOSE 96 08/30/2015   CHOL 166 07/10/2014   TRIG 101.0 07/10/2014   HDL 82.20 07/10/2014   LDLCALC 64 07/10/2014   ALT 24 08/30/2015   AST 23 08/30/2015   NA 140 08/30/2015   K 3.9 08/30/2015   CL 102 08/30/2015   CREATININE 0.98 08/30/2015   BUN 16 08/30/2015   CO2 30 08/30/2015   TSH 2.39 08/30/2015   HGBA1C 5.8 08/30/2015   MICROALBUR 1.6 04/26/2011    ASSESSMENT AND PLAN:  Discussed the following assessment and plan:  Other fatigue  Medication management  Adjustment reaction with anxiety and depression  Depression  Need for prophylactic vaccination and inoculation against influenza - Plan: Flu vaccine HIGH DOSE PF (Fluzone High dose)  Functional GI symptoms  Gastroesophageal reflux disease, esophagitis presence not specified fimbormayalgia is effect .  Most problematic   And has many reasons that  She cannot do certain things   Have her treat exercise movement getting out as a medicine that can prevent   Deterioration of condition  celexaseems to have less se at the current dose .   Shared Decision Making stay same dose consider inc   Also   Would reconsider checkin sleep dx using mouth guard per dentis and not that helpful recently .  -Patient advised to return or notify health care team  if symptoms worsen ,persist or new concerns arise. Total visit 54mins > 50% spent counseling and coordinating care as indicated in above note and in instructions to patient .    Patient Instructions  Stay on the same  Dose of citalopram  10 mg per day .      Give more time  . considier increase to 15 mg  .  Look into   Water exercise  movement .     To help with the pain and energy .  Consideration of  Seeing sleep specialist again    In case sleep dx adding to your  fatigue .    ROV in  4-6 weeks       Antoni Stefan K. Raiford Fetterman M.D.

## 2015-10-11 ENCOUNTER — Other Ambulatory Visit: Payer: Self-pay | Admitting: Internal Medicine

## 2015-10-12 NOTE — Telephone Encounter (Signed)
Tried to reach the pt.  Left a message for a return call.  Need to see if 10 mg working.  Was discussion at last visit that dose may need to be increased.

## 2015-10-14 NOTE — Telephone Encounter (Signed)
Pt said 10 mg is working and would like a refill

## 2015-10-21 DIAGNOSIS — H3562 Retinal hemorrhage, left eye: Secondary | ICD-10-CM | POA: Diagnosis not present

## 2015-10-21 DIAGNOSIS — H25013 Cortical age-related cataract, bilateral: Secondary | ICD-10-CM | POA: Diagnosis not present

## 2015-10-21 DIAGNOSIS — E113292 Type 2 diabetes mellitus with mild nonproliferative diabetic retinopathy without macular edema, left eye: Secondary | ICD-10-CM | POA: Diagnosis not present

## 2015-10-21 DIAGNOSIS — H40013 Open angle with borderline findings, low risk, bilateral: Secondary | ICD-10-CM | POA: Diagnosis not present

## 2015-10-21 LAB — HM DIABETES EYE EXAM

## 2015-10-22 ENCOUNTER — Encounter: Payer: Self-pay | Admitting: Family Medicine

## 2015-10-25 ENCOUNTER — Other Ambulatory Visit: Payer: Self-pay | Admitting: Internal Medicine

## 2015-10-27 NOTE — Telephone Encounter (Signed)
Sent to the pharmacy by e-scribe for 6 months.  Pt had recent labs in 8/17.

## 2015-11-15 ENCOUNTER — Ambulatory Visit (INDEPENDENT_AMBULATORY_CARE_PROVIDER_SITE_OTHER): Payer: Medicare Other | Admitting: Internal Medicine

## 2015-11-15 ENCOUNTER — Encounter: Payer: Self-pay | Admitting: Internal Medicine

## 2015-11-15 VITALS — BP 134/74 | Temp 97.9°F | Ht 59.5 in | Wt 149.6 lb

## 2015-11-15 DIAGNOSIS — K219 Gastro-esophageal reflux disease without esophagitis: Secondary | ICD-10-CM

## 2015-11-15 DIAGNOSIS — R5383 Other fatigue: Secondary | ICD-10-CM | POA: Diagnosis not present

## 2015-11-15 DIAGNOSIS — F321 Major depressive disorder, single episode, moderate: Secondary | ICD-10-CM

## 2015-11-15 DIAGNOSIS — F4323 Adjustment disorder with mixed anxiety and depressed mood: Secondary | ICD-10-CM

## 2015-11-15 DIAGNOSIS — F411 Generalized anxiety disorder: Secondary | ICD-10-CM

## 2015-11-15 DIAGNOSIS — Z79899 Other long term (current) drug therapy: Secondary | ICD-10-CM | POA: Diagnosis not present

## 2015-11-15 MED ORDER — CITALOPRAM HYDROBROMIDE 10 MG PO TABS: 15.0000 mg | ORAL_TABLET | Freq: Every day | ORAL | 2 refills | Status: DC

## 2015-11-15 NOTE — Patient Instructions (Addendum)
Try increasing the  celexa 15 mg    For 2 weeks  And then even 20 mg   Per day .   Consider  Adding counseling to the anxiety  ofr treatment .   ROV in 2-3 months

## 2015-11-15 NOTE — Progress Notes (Signed)
Chief Complaint  Patient presents with  . Follow-up    Medication Follow UP.  Change in medication.    HPI:  Caitlin [name] 70 y.o.  Here for fu of multifactorial fatigue  And  meds Since last time she is weaning down the sertraline and switched over to 10 mg of Celexa. Her drowsiness is decreased but she did have a headache for a week after transition. She has not gone up on the dose of Celexa feels that she is more anxious than usual and she was on the sertraline.  GI still bothering her diarrhea with her PPI used to be patient of Dr. Nichola Sizer feels that she needs a follow-up in perhaps a colonoscopy but had trouble with the last one.  It appears that she feels more anxious about getting older and the limitations of that condition.   ROS: See pertinent positives and negatives per HPI. No cp sob per see gi as previous never did  Counseling   Past Medical History:  Diagnosis Date  . Allergy   . Diverticulosis of colon   . Erythema nodosum    Tends to be seasonal has had a negative chest x-ray in the past negative PPD  . GERD (gastroesophageal reflux disease)    History of stricture and dilatation and Nissen surgery  . Hypertension   . Meniere's disease    ? dx per dr Marcelline Mates  . Mood disorder ([place])     Family History  Problem Relation Age of Onset  . Cancer Mother   . Depression Mother   . Depression Father   . COPD Sister     Deceased 12-27-09  . Nephrolithiasis    . Dementia Brother     Social History   Social History  . Marital status: Married    Spouse name: N/A  . Number of children: 1  . Years of education: N/A   Occupational History  . Retired    Social History Main Topics  . Smoking status: Never Smoker  . Smokeless tobacco: Never Used  . Alcohol use No  . Drug use: No  . Sexual activity: Not on file   Other Topics Concern  . Not on file   Social History Narrative   Unemployed   Married   [place] of 2    Has grandchildren out of state   No  pets   Sis. died 2009-12-27 COPD    Outpatient Medications Prior to Visit  Medication Sig Dispense Refill  . calcium carbonate (TUMS EX) 750 MG chewable tablet Chew 1 tablet by mouth as needed for heartburn.    . cholecalciferol (VITAMIN D) 1000 UNITS tablet Take 2,000 Units by mouth daily.     [name] esomeprazole (NEXIUM) 20 MG capsule Take 20 mg by mouth daily at 12 noon. Reported on 03/08/2015    . KLOR-CON M10 10 MEQ tablet  TAKE 1 TABLET BY MOUTH EVERY OTHER DAY WITH FLUID TABLET 45 tablet 2  . Naproxen Sodium 220 MG CAPS Take by mouth. Reported on 03/08/2015    . Phenylephrine-Acetaminophen 5-325 MG CAPS Take by mouth.    . triamterene-hydrochlorothiazide (MAXZIDE-25) 37.5-25 MG tablet TAKE ONE-HALF TABLET BY MOUTH ONCE DAILY 45 tablet 1  . vitamin B-12 (CYANOCOBALAMIN) 100 MCG tablet Take 500 mcg by mouth daily.     . citalopram (CELEXA) 10 MG tablet TAKE ONE TABLET BY MOUTH ONCE DAILY 30 tablet 0  . Probiotic Product (PROBIOTIC PO) Take 1 tablet by mouth daily.  No facility-administered medications prior to visit.      EXAM:  BP 134/74 (BP Location: Right Arm, Patient Position: Sitting, Cuff Size: Normal)   Temp 97.9 F (36.6 C) (Oral)   Ht 4' 11.5" (1.511 m)   Wt 149 lb 9.6 oz (67.9 kg)   BMI 29.71 kg/m   Body mass index is 29.71 kg/m.  GENERAL: vitals reviewed and listed above, alert, oriented, appears well hydrated and in no acute distress HEENT: atraumatic, conjunctiva  clear, no obvious abnormalities on inspection of external nose and ears MS: moves all extremities without noticeable focal  abnormality PSYCH: pleasant and cooperative,  fialy flat affect depressed appearing but will smile   PHQ-SADS Somatic:20 mostly gi CO GAD7: 17( no on restlessness)neg panic attacks  PHQ9:24  All 3 x harm 0 Difficulty :very  Lab Results  Component Value Date   WBC 7.8 08/30/2015   HGB 14.5 08/30/2015   HCT 43.1 08/30/2015   PLT 271.0 08/30/2015   GLUCOSE 96 08/30/2015     CHOL 166 07/10/2014   TRIG 101.0 07/10/2014   HDL 82.20 07/10/2014   LDLCALC 64 07/10/2014   ALT 24 08/30/2015   AST 23 08/30/2015   NA 140 08/30/2015   K 3.9 08/30/2015   CL 102 08/30/2015   CREATININE 0.98 08/30/2015   BUN 16 08/30/2015   CO2 30 08/30/2015   TSH 2.39 08/30/2015   HGBA1C 5.8 08/30/2015   MICROALBUR 1.6 04/26/2011    ASSESSMENT AND PLAN:  Discussed the following assessment and plan:  Anxiety state  Medication management  Moderate single current episode of major depressive disorder (HCC)  Other fatigue  Adjustment reaction with anxiety and depression  Gastroesophageal reflux disease, esophagitis presence not specified She has ongoing depressive and anxiety symptoms. Her depression somewhat like dysthymia but is severe on the pH 9 checklist. She talks about her anxiety being the most problematic. Discussed benefit of counseling because there are specific triggers. She has less side effects with the Celexa but it is very low dose was increased to 15 mg and then 20 mg she was concerned about cutting the pills and having being accurate dosing. Consider other options consultations discussed counseling She should make an appointment with the new gastroenterology doctor since Dr. Vic Ripper retired for another help with her ongoing GI predicament. She feels that her PPI causes loose stools and diarrhea she backs off she gets bad burning heartburn.  Total visit 30mins > 50% spent counseling and coordinating care as indicated in above note and in instructions to patient .   -Patient advised to return or notify health care team  if symptoms worsen ,persist or new concerns arise.  Patient Instructions  Try increasing the  celexa 15 mg    For 2 weeks  And then even 20 mg   Per day .   Consider  Adding counseling to the anxiety  ofr treatment .   ROV in 2-3 months   [name] K. Trenton Passow M.D.

## 2016-01-04 ENCOUNTER — Other Ambulatory Visit: Payer: Self-pay | Admitting: Internal Medicine

## 2016-01-05 NOTE — Telephone Encounter (Signed)
Sent to the pharmacy by e-scribe. 

## 2016-02-14 NOTE — Progress Notes (Signed)
Chief Complaint  Patient presents with  . Follow-up    medications, complaining of gas after eating    HPI: Caitlin [name] 71 y.o.   Fu anxiety and meds and fatigue  And med conditiaon Drowsiness   Still but helping the anxiety . Hard to cut the pills in half so she is taking 20 mg tried taking it earlier still tired and sleepy most of the day. When she goes down in her dose she tends to be more irritable and snappy people. Continued having GI symptoms. To see the GI department and plan for colonoscopy soon. ROS: See pertinent positives and negatives per HPI.  Past Medical History:  Diagnosis Date  . Allergy   . Diverticulosis of colon   . Erythema nodosum    Tends to be seasonal has had a negative chest x-ray in the past negative PPD  . GERD (gastroesophageal reflux disease)    History of stricture and dilatation and Nissen surgery  . Hypertension   . Meniere's disease    ? dx per dr Marcelline Mates  . Mood disorder ([place])     Family History  Problem Relation Age of Onset  . Cancer Mother   . Depression Mother   . Depression Father   . COPD Sister     Deceased December 24, 2009  . Nephrolithiasis    . Dementia Brother     Social History   Social History  . Marital status: Married    Spouse name: N/A  . Number of children: 1  . Years of education: N/A   Occupational History  . Retired    Social History Main Topics  . Smoking status: Never Smoker  . Smokeless tobacco: Never Used  . Alcohol use No  . Drug use: No  . Sexual activity: Not Asked   Other Topics Concern  . None   Social History Narrative   Unemployed   Married   [place] of 2    Has grandchildren out of state   No pets   Sis. died Dec 24, 2009 COPD    Outpatient Medications Prior to Visit  Medication Sig Dispense Refill  . calcium carbonate (TUMS EX) 750 MG chewable tablet Chew 1 tablet by mouth as needed for heartburn.    . cholecalciferol (VITAMIN D) 1000 UNITS tablet Take 2,000 Units by mouth daily.      [name] esomeprazole (NEXIUM) 20 MG capsule Take 20 mg by mouth daily at 12 noon. Reported on 03/08/2015    . FREESTYLE LITE test strip USE TO CHECK BLOOD SUGAR DAILY AND AS NEEDED 100 each 4  . KLOR-CON M10 10 MEQ tablet  TAKE 1 TABLET BY MOUTH EVERY OTHER DAY WITH FLUID TABLET 45 tablet 2  . Lancets (FREESTYLE) lancets USE TO CHECK BLOOD SUGAR DAILY AND AS NEEDED 100 each 4  . Methylcellulose, Laxative, (CITRUCEL PO) Take by mouth.    . Naproxen Sodium 220 MG CAPS Take by mouth. Reported on 03/08/2015    . Phenylephrine-Acetaminophen 5-325 MG CAPS Take by mouth.    . RESTASIS 0.05 % ophthalmic emulsion     . triamterene-hydrochlorothiazide (MAXZIDE-25) 37.5-25 MG tablet TAKE ONE-HALF TABLET BY MOUTH ONCE DAILY 45 tablet 1  . vitamin B-12 (CYANOCOBALAMIN) 100 MCG tablet Take 500 mcg by mouth daily.     . citalopram (CELEXA) 10 MG tablet Take 1.5 tablets (15 mg total) by mouth daily. Increase to 20 mg 2 po qd 60 tablet 2   No facility-administered medications prior to visit.  EXAM:  BP 128/82 (BP Location: Left Arm, Patient Position: Sitting, Cuff Size: Normal)   Pulse 64   Temp 98 F (36.7 C) (Oral)   Wt 147 lb 12.8 oz (67 kg)   SpO2 96%   BMI 29.35 kg/m   Body mass index is 29.35 kg/m.  GENERAL: vitals reviewed and listed above, alert, oriented, appears well hydrated and in no acute distress HEENT: atraumatic, conjunctiva  clear, no obvious abnormalities on inspection of external nose and ears MS: moves all extremities without noticeable focal  abnormality PSYCH: pleasant and cooperative, no obvious depression or anxietyShe doesn't more relaxed smile and more interactive.  BP Readings from Last 3 Encounters:  02/15/16 128/82  11/15/15 134/74  10/01/15 138/82   Wt Readings from Last 3 Encounters:  02/15/16 147 lb 12.8 oz (67 kg)  11/15/15 149 lb 9.6 oz (67.9 kg)  10/01/15 150 lb 12.8 oz (68.4 kg)    ASSESSMENT AND PLAN:  Discussed the following assessment and  plan:  Anxiety state - optinos discussed  plant trial lf lexapro.   Medication management  Side effects of treatment, sequela Discussion about options indeed 20 mg seems a bit sedating that helps her symptoms. 15 mg is not tenable at this time reviewed her record thought she had been on Lexapro but apparently not considerations Cymbalta also. Lexapro may be less sedating than the citalopram will change trial of 10 mg of Lexapro once a day will let us know how it is doing she can come back after the flu season and plan to make other changes as appropriate. She will keep her follow-up of transitioning care with GI in the meantime. Also has fu with Neuro -Patient advised to return or notify health care team  if symptoms worsen ,persist or new concerns arise. Total visit 30mins > 50% spent counseling and coordinating care as indicated in above note and in instructions to patient .     Patient Instructions  Switch    To 10 mg of lexapro per day  If not working or too sedation we will adjust the medication.  ( STOP THE CITALOPRAM WITH SWITCH)   Can contact us after a month .  aabout hwo doing or refill   rov  In 2-3 months     Ginna Schuur K. Loletha Bertini M.D.

## 2016-02-15 ENCOUNTER — Encounter: Payer: Self-pay | Admitting: Internal Medicine

## 2016-02-15 ENCOUNTER — Ambulatory Visit (INDEPENDENT_AMBULATORY_CARE_PROVIDER_SITE_OTHER): Payer: Medicare Other | Admitting: Internal Medicine

## 2016-02-15 VITALS — BP 128/82 | HR 64 | Temp 98.0°F | Wt 147.8 lb

## 2016-02-15 DIAGNOSIS — F411 Generalized anxiety disorder: Secondary | ICD-10-CM

## 2016-02-15 DIAGNOSIS — T889XXS Complication of surgical and medical care, unspecified, sequela: Secondary | ICD-10-CM

## 2016-02-15 DIAGNOSIS — Z79899 Other long term (current) drug therapy: Secondary | ICD-10-CM | POA: Diagnosis not present

## 2016-02-15 MED ORDER — ESCITALOPRAM OXALATE 10 MG PO TABS: 10.0000 mg | ORAL_TABLET | Freq: Every day | ORAL | 3 refills | Status: DC

## 2016-02-15 NOTE — Patient Instructions (Addendum)
Switch    To 10 mg of lexapro per day  If not working or too sedation we will adjust the medication.  ( STOP THE CITALOPRAM WITH SWITCH)   Can contact us after a month .  aabout hwo doing or refill   rov  In 2-3 months

## 2016-02-15 NOTE — Progress Notes (Signed)
Pre visit review using our clinic review tool, if applicable. No additional management support is needed unless otherwise documented below in the visit note. 

## 2016-03-10 ENCOUNTER — Ambulatory Visit (INDEPENDENT_AMBULATORY_CARE_PROVIDER_SITE_OTHER): Payer: Medicare Other | Admitting: Neurology

## 2016-03-10 ENCOUNTER — Encounter: Payer: Self-pay | Admitting: Neurology

## 2016-03-10 VITALS — BP 124/80 | HR 67 | Ht 59.5 in | Wt 147.0 lb

## 2016-03-10 DIAGNOSIS — G4730 Sleep apnea, unspecified: Secondary | ICD-10-CM | POA: Diagnosis not present

## 2016-03-10 DIAGNOSIS — R413 Other amnesia: Secondary | ICD-10-CM

## 2016-03-10 NOTE — Patient Instructions (Signed)
1. Schedule sleep study 2. Continue control of blood pressure, cholesterol, as well as physical exercise and brain stimulation exercises for brain health 3. Follow-up in 1 year, call for any changes

## 2016-03-10 NOTE — Progress Notes (Signed)
NEUROLOGY FOLLOW UP OFFICE NOTE  Caitlin [name] [id number]  HISTORY OF PRESENT ILLNESS: I had the pleasure of seeing Caitlin [name] in follow-up in the neurology clinic on 03/10/2016.  The patient was last seen a year ago for worsening memory. MMSE in February 2017 was again normal 30/30. Since her last visit, she feels memory is about the same. She denies getting lost driving, no missed medications. Her husband is in charge of bills. She misplaces things frequently. We discussed effects of anxiety on memory, Zoloft made her sleepy, she has been on Lexapro the past month with a little dizziness, she has not noticed any difference in symptoms. She has occasional mild headaches. She has been told she snores and has some daytime drowsiness. She denies any dizziness, diplopia, focal numbness/tingling/weakness, bowel/bladder dysfunction. She has pain in both shoulders. No falls.   HPI 09/01/14: This is a 71 yo RH woman with a history of hypertension, diet-controlled hyperlipidemia, IBS, GERD, chronic neck and back pain, depression and anxiety, who presented for worsening memory. She reports that memory changes started several years ago, she would be unable to think of a name or word she wanted to say. She has had to write everything down otherwise she would forget appointments. She recalls the first time this happened was 12 years ago, she got a reminder call about a dentist appointment 2 days prior but still missed it. She is has been stressed and worried because 2 of her husband's family members have recently passed away with dementia. She continues to drive without getting lost. She denies missing her regular medications but occasionally forgets her vitamins. She has to stay by the stove when cooking, one time she walked away from soup she was making and forgot about it. Her husband is in charge of bill payments. She has always had problems multi-tasking, but has noticed this has been worse the past couple of  years, she easily loses her train of thought when distracted. Her maternal grandmother and 2 brothers were diagnosed with dementia. Her mother and sister had memory problems before they passed away from other medical conditions. She denies any significant head injuries, no alcohol use.   Diagnostic Data: MRI brain in 2012 was personally reviewed today, unremarkable with mild diffuse atrophy. Similar to MRI brain done 08/2014 as above.  PAST MEDICAL HISTORY: Past Medical History:  Diagnosis Date  . Allergy   . Diverticulosis of colon   . Erythema nodosum    Tends to be seasonal has had a negative chest x-ray in the past negative PPD  . GERD (gastroesophageal reflux disease)    History of stricture and dilatation and Nissen surgery  . Hypertension   . Meniere's disease    ? dx per dr Marcelline Mates  . Mood disorder [facility])     MEDICATIONS: Current Outpatient Prescriptions on File Prior to Visit  Medication Sig Dispense Refill  . calcium carbonate (TUMS EX) 750 MG chewable tablet Chew 1 tablet by mouth as needed for heartburn.    . cholecalciferol (VITAMIN D) 1000 UNITS tablet Take 2,000 Units by mouth daily.     [name] escitalopram (LEXAPRO) 10 MG tablet Take 1 tablet (10 mg total) by mouth daily. 30 tablet 3  . esomeprazole (NEXIUM) 20 MG capsule Take 20 mg by mouth daily at 12 noon. Reported on 03/08/2015    . FREESTYLE LITE test strip USE TO CHECK BLOOD SUGAR DAILY AND AS NEEDED 100 each 4  . KLOR-CON M10 10 MEQ tablet  TAKE 1 TABLET BY MOUTH EVERY OTHER DAY WITH FLUID TABLET 45 tablet 2  . Lancets (FREESTYLE) lancets USE TO CHECK BLOOD SUGAR DAILY AND AS NEEDED 100 each 4  . Methylcellulose, Laxative, (CITRUCEL PO) Take by mouth.    . Naproxen Sodium 220 MG CAPS Take by mouth. Reported on 03/08/2015    . Phenylephrine-Acetaminophen 5-325 MG CAPS Take by mouth.    . RESTASIS 0.05 % ophthalmic emulsion     . triamterene-hydrochlorothiazide (MAXZIDE-25) 37.5-25 MG tablet TAKE ONE-HALF TABLET BY MOUTH  ONCE DAILY 45 tablet 1  . vitamin B-12 (CYANOCOBALAMIN) 100 MCG tablet Take 500 mcg by mouth daily.      No current facility-administered medications on file prior to visit.     ALLERGIES: Allergies  Allergen Reactions  . Mobic [Meloxicam] Diarrhea  . Codeine Phosphate Other (See Comments)    Hallucinations  . Omeprazole-Sodium Bicarbonate   . Protonix [Pantoprazole Sodium] Diarrhea    Leg nodules  . Sulfamethoxazole Diarrhea and Nausea And Vomiting    FAMILY HISTORY: Family History  Problem Relation Age of Onset  . Cancer Mother   . Depression Mother   . Depression Father   . COPD Sister     Deceased 12-22-09  . Nephrolithiasis    . Dementia Brother     SOCIAL HISTORY: Social History   Social History  . Marital status: Married    Spouse name: N/A  . Number of children: 1  . Years of education: N/A   Occupational History  . Retired    Social History Main Topics  . Smoking status: Never Smoker  . Smokeless tobacco: Never Used  . Alcohol use No  . Drug use: No  . Sexual activity: Not on file   Other Topics Concern  . Not on file   Social History Narrative   Unemployed   Married   [place] of 2    Has grandchildren out of state   No pets   Sis. died 12/22/09 COPD    REVIEW OF SYSTEMS: Constitutional: No fevers, chills, or sweats, no generalized fatigue, change in appetite Eyes: No visual changes, double vision, eye pain Ear, nose and throat: No hearing loss, ear pain, nasal congestion, sore throat Cardiovascular: No chest pain, palpitations Respiratory:  No shortness of breath at rest or with exertion, wheezes GastrointestinaI: No nausea, vomiting, +diarrhea,no abdominal pain, fecal incontinence Genitourinary:  No dysuria, urinary retention or frequency Musculoskeletal:  + neck pain, back pain Integumentary: No rash, pruritus, skin lesions Neurological: as above Psychiatric: No depression, insomnia, anxiety Endocrine: No palpitations, fatigue,  diaphoresis, mood swings, change in appetite, change in weight, increased thirst Hematologic/Lymphatic:  No anemia, purpura, petechiae. Allergic/Immunologic: no itchy/runny eyes, nasal congestion, recent allergic reactions, rashes  PHYSICAL EXAM: Vitals:   03/10/16 1517  BP: 124/80  Pulse: 67   General: No acute distress Head:  Normocephalic/atraumatic Neck: supple, no paraspinal tenderness, full range of motion Heart:  Regular rate and rhythm Lungs:  Clear to auscultation bilaterally Back: No paraspinal tenderness Skin/Extremities: No rash, no edema Neurological Exam: alert and oriented to person, place, and time. No aphasia or dysarthria. Fund of knowledge is appropriate.  Recent and remote memory are intact.  Attention and concentration are normal.    Able to name objects and repeat phrases. CDT 5/5. MMSE - Mini Mental State Exam 03/10/2016 03/08/2015 09/01/2014  Orientation to time 5 5 5   Orientation to Place 5 5 5   Registration 3 3 3   Attention/ Calculation 4 5  5  Recall 3 3 3   Language- name 2 objects 2 2 2   Language- repeat 1 1 1   Language- follow 3 step command 3 3 3   Language- read & follow direction 1 1 1   Write a sentence 1 1 1   Copy design 1 1 1   Total score 29 30 30     Cranial nerves: Pupils equal, round, reactive to light.  Extraocular movements intact with no nystagmus. Visual fields full. Facial sensation intact. No facial asymmetry. Tongue, uvula, palate midline.  Motor: Bulk and tone normal, muscle strength 5/5 throughout with no pronator drift.  Sensation to light touch intact.  No extinction to double simultaneous stimulation.  Deep tendon reflexes 2+ throughout, toes downgoing.  Finger to nose testing intact.  Gait narrow-based and steady, able to tandem walk adequately.  Romberg negative.  IMPRESSION: This is a 71 yo RH woman with a history of hypertension, diet-controlled hyperlipidemia, depression, anxiety, GERD,who presented for worsening memory loss. MMSE  today again normal 29/30 (30/30 in February 2017 and August 2016). MRI brain unremarkable, TSH and B12 normal. She feels memory is unchanged. She has been switched to Lexapro for anxiety and so far has not noticed any difference. She again remarked on concern about family history of dementia and a sister-in-law with early dementia, patient was again reassured about normal exam. No indication to start cholinesterase inhibitors such as Aricept. She reports snoring and daytime drowsiness and has been told in the past she had sleep apnea, we discussed how possible sleep apnea can also contribute to cognitive changes. She will be scheduled for a sleep study. We again discussed the importance of control of vascular risk factors, physical exercise, and brain stimulation exercises for brain health. She will follow-up in 1 year or earlier if needed.   Thank you for allowing me to participate in her care.  Please do not hesitate to call for any questions or concerns.  The duration of this appointment visit was 25 minutes of face-to-face time with the patient.  Greater than 50% of this time was spent in counseling, explanation of diagnosis, planning of further management, and coordination of care.   [name], M.D.   CC: Dr. Regis Bill

## 2016-03-16 ENCOUNTER — Encounter: Payer: Self-pay | Admitting: Neurology

## 2016-04-16 ENCOUNTER — Other Ambulatory Visit: Payer: Self-pay | Admitting: Internal Medicine

## 2016-04-28 ENCOUNTER — Emergency Department (HOSPITAL_COMMUNITY): Payer: Medicare Other

## 2016-04-28 ENCOUNTER — Encounter (HOSPITAL_COMMUNITY): Payer: Self-pay | Admitting: Emergency Medicine

## 2016-04-28 ENCOUNTER — Emergency Department (HOSPITAL_COMMUNITY)
Admission: EM | Admit: 2016-04-28 | Discharge: 2016-04-29 | Disposition: A | Discharge status: Home / Self Care | Payer: Medicare Other | Attending: Physician Assistant | Admitting: Physician Assistant

## 2016-04-28 DIAGNOSIS — K529 Noninfective gastroenteritis and colitis, unspecified: Secondary | ICD-10-CM

## 2016-04-28 DIAGNOSIS — Z79899 Other long term (current) drug therapy: Secondary | ICD-10-CM | POA: Diagnosis not present

## 2016-04-28 DIAGNOSIS — I10 Essential (primary) hypertension: Secondary | ICD-10-CM | POA: Diagnosis not present

## 2016-04-28 DIAGNOSIS — E860 Dehydration: Secondary | ICD-10-CM | POA: Diagnosis not present

## 2016-04-28 DIAGNOSIS — R1110 Vomiting, unspecified: Secondary | ICD-10-CM | POA: Diagnosis not present

## 2016-04-28 DIAGNOSIS — R112 Nausea with vomiting, unspecified: Secondary | ICD-10-CM | POA: Diagnosis present

## 2016-04-28 DIAGNOSIS — R197 Diarrhea, unspecified: Secondary | ICD-10-CM | POA: Diagnosis not present

## 2016-04-28 LAB — CBC
HEMATOCRIT: 45.6 % (ref 36.0–46.0)
Hemoglobin: 15.4 g/dL — ABNORMAL HIGH (ref 12.0–15.0)
MCH: 29.1 pg (ref 26.0–34.0)
MCHC: 33.8 g/dL (ref 30.0–36.0)
MCV: 86.2 fL (ref 78.0–100.0)
Platelets: 308 10*3/uL (ref 150–400)
RBC: 5.29 MIL/uL — ABNORMAL HIGH (ref 3.87–5.11)
RDW: 13.7 % (ref 11.5–15.5)
WBC: 21.9 10*3/uL — AB (ref 4.0–10.5)

## 2016-04-28 LAB — COMPREHENSIVE METABOLIC PANEL
ALBUMIN: 4.5 g/dL (ref 3.5–5.0)
ALT: 15 U/L (ref 14–54)
AST: 24 U/L (ref 15–41)
Alkaline Phosphatase: 70 U/L (ref 38–126)
Anion gap: 13 (ref 5–15)
BUN: 13 mg/dL (ref 6–20)
CHLORIDE: 99 mmol/L — AB (ref 101–111)
CO2: 25 mmol/L (ref 22–32)
Calcium: 9.7 mg/dL (ref 8.9–10.3)
Creatinine, Ser: 1.13 mg/dL — ABNORMAL HIGH (ref 0.44–1.00)
GFR calc Af Amer: 56 mL/min — ABNORMAL LOW (ref >60)
GFR, EST NON AFRICAN AMERICAN: 48 mL/min — AB (ref >60)
GLUCOSE: 112 mg/dL — AB (ref 65–99)
POTASSIUM: 3.3 mmol/L — AB (ref 3.5–5.1)
Sodium: 137 mmol/L (ref 135–145)
Total Bilirubin: 1.4 mg/dL — ABNORMAL HIGH (ref 0.3–1.2)
Total Protein: 7.4 g/dL (ref 6.5–8.1)

## 2016-04-28 LAB — URINALYSIS, ROUTINE W REFLEX MICROSCOPIC
Bilirubin Urine: NEGATIVE
GLUCOSE, UA: NEGATIVE mg/dL
Hgb urine dipstick: NEGATIVE
KETONES UR: 20 mg/dL — AB
LEUKOCYTES UA: NEGATIVE
Nitrite: NEGATIVE
Protein, ur: NEGATIVE mg/dL
Specific Gravity, Urine: 1.016 (ref 1.005–1.030)
pH: 5 (ref 5.0–8.0)

## 2016-04-28 LAB — LIPASE, BLOOD: LIPASE: 26 U/L (ref 11–51)

## 2016-04-28 MED ORDER — SODIUM CHLORIDE 0.9 % IV BOLUS (SEPSIS)
1000.0000 mL | Freq: Once | INTRAVENOUS | Status: AC
  Administered 2016-04-28: 1000 mL via INTRAVENOUS

## 2016-04-28 MED ORDER — ONDANSETRON 4 MG PO TBDP
4.0000 mg | ORAL_TABLET | Freq: Once | ORAL | Status: AC
  Administered 2016-04-28: 4 mg via ORAL
  Filled 2016-04-28: qty 1

## 2016-04-28 MED ORDER — IOPAMIDOL (ISOVUE-300) INJECTION 61%
INTRAVENOUS | Status: AC
  Administered 2016-04-28: 75 mL
  Filled 2016-04-28: qty 75

## 2016-04-28 MED ORDER — ONDANSETRON 4 MG PO TBDP: 4.0000 mg | ORAL_TABLET | Freq: Once | ORAL | Status: DC | PRN

## 2016-04-28 NOTE — ED Notes (Signed)
ODT Zofran given prior to oral challenge

## 2016-04-28 NOTE — ED Provider Notes (Signed)
[facility] DEPT Provider Note   CSN: [id number] Arrival date & time: 04/28/16  1802     History   Chief Complaint Chief Complaint  Patient presents with  . Emesis  . Diarrhea    HPI Caitlin [name] is a 72 y.o. female.  The history is provided by the patient, the spouse and medical records.  GI Problem  This is a new problem. The current episode started more than 2 days ago. The problem occurs daily. The problem has not changed since onset.Pertinent negatives include no chest pain, no abdominal pain, no headaches and no shortness of breath. Nothing aggravates the symptoms. Relieved by: nothing tried. She has tried nothing for the symptoms.    Past Medical History:  Diagnosis Date  . Allergy   . Diverticulosis of colon   . Erythema nodosum    Tends to be seasonal has had a negative chest x-ray in the past negative PPD  . GERD (gastroesophageal reflux disease)    History of stricture and dilatation and Nissen surgery  . Hypertension   . Meniere's disease    ? dx per dr Marcelline Mates  . Mood disorder [facility])     Patient Active Problem List   Diagnosis Date Noted  . Memory loss 09/01/2014  . Depression 09/01/2014  . Generalized anxiety disorder 09/01/2014  . Hair loss 08/12/2013  . Medication management 08/12/2013  . Erythema nodosum 06/05/2012  . Screening-pulmonary TB 06/05/2012  . Elevated cholesterol 04/29/2012  . Osteoarthritis 11/28/2011  . Arthritis 10/28/2011  . Sleep disturbance 10/28/2011  . Visit for preventive health examination 04/27/2011  . GERD (gastroesophageal reflux disease)   . Prediabetes 04/19/2011  . Hand dermatitis 12/29/2010  . Hypotension 06/07/2010  . Vitamin B12 deficiency 05/13/2010  . [place] DISEASE 01/14/2010  . GASTROPARESIS 12/23/2008  . IRRITABLE BOWEL SYNDROME 12/23/2008  . DECREASED HEARING, LEFT EAR 11/27/2008  . LOOSE STOOLS 11/27/2008  . SHOULDER PAIN, LEFT 09/01/2008  . LEG CRAMPS, NOCTURNAL 11/11/2007  . BACK PAIN  10/09/2007  . SLEEP DISORDER 04/23/2007  . FATIGUE 03/12/2007  . DIVERTICULOSIS, COLON 12/10/2006  . CONSTIPATION 12/10/2006  . FIBROMYALGIA 12/10/2006  . INTESTINAL GAS 12/10/2006  . DEPRESSION 09/10/2006  . HYPERGLYCEMIA 08/15/2006  . HYPERTENSION 07/11/2006  . ALLERGIC RHINITIS 07/11/2006  . GERD 07/11/2006  . HIATAL HERNIA 07/11/2006  . ECZEMA 07/11/2006    Past Surgical History:  Procedure Laterality Date  . ABDOMINAL HYSTERECTOMY    . CESAREAN SECTION     x 1  . CHOLECYSTECTOMY    . ESOPHAGOGASTRODUODENOSCOPY     stricture and dilatation  . left shoulder surgery  06/08   x 2  . NISSEN FUNDOPLICATION    . rt knee surgery     scope    OB History    No data available       Home Medications    Prior to Admission medications   Medication Sig Start Date End Date Taking? Authorizing Provider  calcium carbonate (TUMS EX) 750 MG chewable tablet Chew 1 tablet by mouth as needed for heartburn.   Yes Historical Provider, MD  cholecalciferol (VITAMIN D) 1000 UNITS tablet Take 2,000 Units by mouth daily.    Yes Historical Provider, MD  escitalopram (LEXAPRO) 10 MG tablet Take 1 tablet (10 mg total) by mouth daily. 02/15/16  Yes Burnis Medin, MD  esomeprazole (NEXIUM) 20 MG capsule Take 20 mg by mouth daily at 12 noon. Reported on 03/08/2015   Yes Historical Provider, MD  FREESTYLE LITE test  strip USE TO CHECK BLOOD SUGAR DAILY AND AS NEEDED 01/05/16  Yes Burnis Medin, MD  KLOR-CON M10 10 MEQ tablet  TAKE 1 TABLET BY MOUTH EVERY OTHER DAY WITH FLUID TABLET 09/09/15  Yes Burnis Medin, MD  Lancets (FREESTYLE) lancets USE TO CHECK BLOOD SUGAR DAILY AND AS NEEDED 01/05/16  Yes Burnis Medin, MD  Methylcellulose, Laxative, (CITRUCEL PO) Take by mouth.   Yes Historical Provider, MD  Naproxen Sodium 220 MG CAPS Take 220-440 mg by mouth daily as needed (pain). Reported on 03/08/2015   Yes Historical Provider, MD  RESTASIS 0.05 % ophthalmic emulsion Place 1 drop into both eyes 2  (two) times daily.  11/04/15  Yes Historical Provider, MD  triamterene-hydrochlorothiazide (ZDGUYQI-34) 37.5-25 MG tablet TAKE ONE-HALF TABLET BY MOUTH ONCE DAILY. 04/18/16  Yes Marletta Lor, MD  ciprofloxacin (CIPRO) 500 MG tablet Take 1 tablet (500 mg total) by mouth 2 (two) times daily. 04/29/16 05/06/16  Jenny Reichmann, MD  metroNIDAZOLE (FLAGYL) 500 MG tablet Take 1 tablet (500 mg total) by mouth 3 (three) times daily. 04/29/16 05/06/16  Jenny Reichmann, MD  ondansetron (ZOFRAN) 4 MG tablet Take 1 tablet (4 mg total) by mouth every 8 (eight) hours as needed for nausea or vomiting. 04/29/16 05/03/16  Jenny Reichmann, MD    Family History Family History  Problem Relation Age of Onset  . Cancer Mother   . Depression Mother   . Depression Father   . COPD Sister     Deceased 12/16/09  . Nephrolithiasis    . Dementia Brother     Social History Social History  Substance Use Topics  . Smoking status: Never Smoker  . Smokeless tobacco: Never Used  . Alcohol use No     Allergies   Mobic [meloxicam]; Codeine phosphate; Omeprazole-sodium bicarbonate; Protonix [pantoprazole sodium]; and Sulfamethoxazole   Review of Systems Review of Systems  Constitutional: Positive for appetite change and fatigue. Negative for chills and fever.  HENT: Negative for ear pain and sore throat.   Eyes: Negative for pain and visual disturbance.  Respiratory: Negative for cough and shortness of breath.   Cardiovascular: Negative for chest pain and palpitations.  Gastrointestinal: Positive for diarrhea (chronic, but "worse" this week) and vomiting. Negative for abdominal pain and blood in stool.  Genitourinary: Negative for dysuria and hematuria.  Musculoskeletal: Negative for arthralgias and back pain.  Skin: Negative for color change and rash.  Neurological: Negative for seizures, syncope and headaches.  All other systems reviewed and are negative.    Physical Exam Updated Vital Signs BP 123/75    Pulse 83   Temp 98.4 F (36.9 C) (Oral)   Resp 18   Ht 4\' 11"  (1.499 m)   Wt 64 kg   SpO2 96%   BMI 28.48 kg/m   Physical Exam  Constitutional: She is oriented to person, place, and time. She appears well-developed and well-nourished. No distress.  HENT:  Head: Normocephalic and atraumatic.  Eyes: Conjunctivae are normal.  Neck: Neck supple.  Cardiovascular: Normal rate and regular rhythm.   No murmur heard. Pulmonary/Chest: Effort normal and breath sounds normal. No respiratory distress.  Abdominal: Soft. There is no tenderness. There is no guarding.  Musculoskeletal: She exhibits no edema.  Neurological: She is alert and oriented to person, place, and time.  Skin: Skin is warm and dry.  Psychiatric: She has a normal mood and affect.  Nursing note and vitals reviewed.    ED Treatments / Results  Labs (  all labs ordered are listed, but only abnormal results are displayed) Labs Reviewed  COMPREHENSIVE METABOLIC PANEL - Abnormal; Notable for the following:       Result Value   Potassium 3.3 (*)    Chloride 99 (*)    Glucose, Bld 112 (*)    Creatinine, Ser 1.13 (*)    Total Bilirubin 1.4 (*)    GFR calc non Af Amer 48 (*)    GFR calc Af Amer 56 (*)    All other components within normal limits  CBC - Abnormal; Notable for the following:    WBC 21.9 (*)    RBC 5.29 (*)    Hemoglobin 15.4 (*)    All other components within normal limits  URINALYSIS, ROUTINE W REFLEX MICROSCOPIC - Abnormal; Notable for the following:    Ketones, ur 20 (*)    All other components within normal limits  LIPASE, BLOOD    EKG  EKG Interpretation None       Radiology Ct Abdomen Pelvis W Contrast  Result Date: 04/28/2016 CLINICAL DATA:  Nausea, vomiting, diarrhea since last week with anorexia EXAM: CT ABDOMEN AND PELVIS WITH CONTRAST TECHNIQUE: Multidetector CT imaging of the abdomen and pelvis was performed using the standard protocol following bolus administration of intravenous  contrast. CONTRAST:  33mL ISOVUE-300 IOPAMIDOL (ISOVUE-300) INJECTION 61% COMPARISON:  None. FINDINGS: Lower chest: Changes of a Nissen fundoplication are present. Normal size cardiac chambers. Clear lung bases. Hepatobiliary: Hepatic steatosis. Cholecystectomy. No mass or biliary dilatation. Pancreas: Normal Spleen: Normal Adrenals/Urinary Tract: Adrenal glands are unremarkable. Kidneys are normal, without renal calculi, focal lesion, or hydronephrosis. Bladder is unremarkable. Stomach/Bowel: The stomach is contracted. There is normal small bowel rotation. Mild thickening of proximal small bowel loops may represent changes of an enteritis. No bowel obstruction is seen. The appendix is not visualized but no convincing evidence of acute appendicitis. No pericecal inflammation is noted. Vascular/Lymphatic: Aortic atherosclerosis. Bold abdomen within the mesentery, there are small subcentimeter short axis lymph nodes surrounded by hazy mesenteric fat, the largest however is seen in the right lower quadrant mesenteric sharp measuring up to 1 cm. 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 Reproductive: Hysterectomy.  No adnexal mass. Other: No abdominal wall hernia or abnormality. No abdominopelvic ascites. Musculoskeletal: Minimal superior endplate compression of T11. No acute nor suspicious osseous appearing abnormalities. IMPRESSION: 1. Hazy mesenteric fat with small lymph nodes associated with this abnormality. 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 2. Mild thickening of proximal small bowel loops suspicious for an enteritis. No bowel obstruction or free air. 3. Hepatic steatosis. 4. Cholecystectomy, Nissen fundoplication and hysterectomy. Electronically Signed   By: Ashley Royalty M.D.   On: 04/28/2016 23:30    Procedures Procedures (including critical care time)  Medications Ordered in ED Medications  ondansetron (ZOFRAN-ODT) disintegrating tablet 4 mg (not administered)    iopamidol (ISOVUE-300) 61 % injection (75 mLs  Contrast Given 04/28/16 2256)  sodium chloride 0.9 % bolus 1,000 mL (1,000 mLs Intravenous New Bag/Given 04/28/16 2321)  ondansetron (ZOFRAN-ODT) disintegrating tablet 4 mg (4 mg Oral Given 04/28/16 2356)     Initial Impression / Assessment and Plan / ED Course  I have reviewed the triage vital signs and the nursing notes.  Pertinent labs & imaging results that were available during my care of the patient were reviewed by me and considered in my medical decision making (see chart for details).    Pt with h/o GERD, IBS, fibromyalgia presents with V/D. Says  she started vomiting on Saturday night & has been eating less since; she's also had chronic diarrhea for months but says that it's been worse this week. She initially thought she had a "stomach flu", and felt like she was getting better this week; today she started vomiting again, so she came in for evaluation. Endorses fatigue, but denies F/C, HA, cough, CP, SOB, abdominal pain, urinary symptoms, suspicious food intake, or sick contacts.  VS & exam as above. Labs remarkable for K 3.3, Cl 99, Crt 1.13, Tbili 1.4, WBC 21.9, Hgb 15.4. NS bolus & Zofran given.  CT A&P shows, "hazy mesenteric fat with small lymph nodes associated with this abnormality. 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 & Mild thickening of proximal small bowel loops suspicious for an enteritis."  Pt able to tolerate PO in the ED w/o issue.  Explained all results to the Pt. Using shared decision-making, she opted for a trial of outpatient treatment w/abx & antiemetics over admission to the hospital. Strict ED return precautions provided. Recommending f/u with her PCP on Monday morning. Pt acknowledged understanding of, and concurrence with the plan. All questions answered to her satisfaction. In stable condition at the time of discharge.  Final Clinical Impressions(s) / ED Diagnoses   Final diagnoses:   Dehydration  Gastroenteritis    New Prescriptions New Prescriptions   CIPROFLOXACIN (CIPRO) 500 MG TABLET    Take 1 tablet (500 mg total) by mouth 2 (two) times daily.   METRONIDAZOLE (FLAGYL) 500 MG TABLET    Take 1 tablet (500 mg total) by mouth 3 (three) times daily.   ONDANSETRON (ZOFRAN) 4 MG TABLET    Take 1 tablet (4 mg total) by mouth every 8 (eight) hours as needed for nausea or vomiting.     Jenny Reichmann, MD 04/29/16 0040    Courteney Julio Alm, MD 04/30/16 1115

## 2016-04-28 NOTE — ED Notes (Signed)
Pt returned from CT via stretcher.

## 2016-04-28 NOTE — ED Notes (Signed)
The pt is here for vomiting and diarrhea sinxce Saturday today she woke up vomiting  She ate something at 1500 and then she vomited. c/o lower abd pain.  Hx of chronic diarrhea  According to her husband

## 2016-04-28 NOTE — ED Notes (Signed)
Pt in CT via stretcher  

## 2016-04-28 NOTE — ED Triage Notes (Signed)
Pt presents to ED for assessment of n/v/d x 1 week, resolved 2 days ago but returned today after eating.  Pt has hx of "anxious stomach", as well as a hiatal hernia, a "kink in my intestines" and GERD.  Pt denies abdominal pain.  C/o increase in gas.

## 2016-04-29 DIAGNOSIS — E860 Dehydration: Secondary | ICD-10-CM | POA: Diagnosis not present

## 2016-04-29 MED ORDER — METRONIDAZOLE 500 MG PO TABS: 500.0000 mg | ORAL_TABLET | Freq: Three times a day (TID) | ORAL | 0 refills | Status: AC

## 2016-04-29 MED ORDER — CIPROFLOXACIN HCL 500 MG PO TABS: 500.0000 mg | ORAL_TABLET | Freq: Two times a day (BID) | ORAL | 0 refills | Status: AC

## 2016-04-29 MED ORDER — ONDANSETRON HCL 4 MG PO TABS: 4.0000 mg | ORAL_TABLET | Freq: Three times a day (TID) | ORAL | 0 refills | Status: DC | PRN

## 2016-04-29 NOTE — ED Notes (Signed)
Pt. Given ginger ale for PO challenge 

## 2016-04-29 NOTE — ED Notes (Signed)
Pt reports no further n/v after PO trial

## 2016-05-01 ENCOUNTER — Encounter: Payer: Self-pay | Admitting: Internal Medicine

## 2016-05-01 ENCOUNTER — Ambulatory Visit (INDEPENDENT_AMBULATORY_CARE_PROVIDER_SITE_OTHER): Payer: Medicare Other | Admitting: Internal Medicine

## 2016-05-01 VITALS — BP 100/80 | HR 65 | Temp 97.8°F | Ht 59.0 in | Wt 141.8 lb

## 2016-05-01 DIAGNOSIS — E876 Hypokalemia: Secondary | ICD-10-CM

## 2016-05-01 DIAGNOSIS — K529 Noninfective gastroenteritis and colitis, unspecified: Secondary | ICD-10-CM | POA: Diagnosis not present

## 2016-05-01 DIAGNOSIS — R109 Unspecified abdominal pain: Secondary | ICD-10-CM | POA: Diagnosis not present

## 2016-05-01 DIAGNOSIS — R935 Abnormal findings on diagnostic imaging of other abdominal regions, including retroperitoneum: Secondary | ICD-10-CM | POA: Diagnosis not present

## 2016-05-01 NOTE — Progress Notes (Signed)
No chief complaint on file.   HPI: Caitlin [name] 71 y.o. come in fo post ed visit .   On sat April 7th   suddenly felt bad and had diarrhea  . Then vomiting the stayed at home all week and t after getting better had another sensation and had to go to  br  And began agin   foirm and then soft. Starts off c  Frequently gas  Her baseline stools a re not normal to begin with  She went to the emergency room and was given IV fluids and has sublingual tablet to help with her nausea which had are he got better. She had a CT scan was given Cipro and metronidazole and told to follow-up with her PCP and a day or 2. She didn't start the antibiotic as of concerns about potential side effects and wanted to ask. Since her emergency room visit she is not worse and has had no more vomiting. Still feels bloated and nauseous. She states that her normal bowel habits are not regular to begin with. But this is different.   Hx of  Constipated  And  freqency .  ROS: See pertinent positives and negatives per HPI. ? April 7 fever chills fever  .    Since  Weekend  Laying around and no bm since hopsital feels like needs to . Fu  Lab showed wqbc 21K     abnd ct cw enteritis  Poss  sugg of  sclerosing mesenteritis   Husband with cough  Not gi illness  feeels feverish but nocumente fever.   Past Medical History:  Diagnosis Date  . Allergy   . Diverticulosis of colon   . Erythema nodosum    Tends to be seasonal has had a negative chest x-ray in the past negative PPD  . GERD (gastroesophageal reflux disease)    History of stricture and dilatation and Nissen surgery  . Hypertension   . Meniere's disease    ? dx per dr Marcelline Mates  . Mood disorder ([place])     Family History  Problem Relation Age of Onset  . Cancer Mother   . Depression Mother   . Depression Father   . COPD Sister     Deceased 12-24-09  . Nephrolithiasis    . Dementia Brother     Social History   Social History  . Marital status:  Married    Spouse name: N/A  . Number of children: 1  . Years of education: N/A   Occupational History  . Retired    Social History Main Topics  . Smoking status: Never Smoker  . Smokeless tobacco: Never Used  . Alcohol use No  . Drug use: No  . Sexual activity: Not Asked   Other Topics Concern  . None   Social History Narrative   Unemployed   Married   [place] of 2    Has grandchildren out of state   No pets   Sis. died 12-24-09 COPD    Outpatient Medications Prior to Visit  Medication Sig Dispense Refill  . escitalopram (LEXAPRO) 10 MG tablet Take 1 tablet (10 mg total) by mouth daily. 30 tablet 3  . KLOR-CON M10 10 MEQ tablet  TAKE 1 TABLET BY MOUTH EVERY OTHER DAY WITH FLUID TABLET 45 tablet 2  . Lancets (FREESTYLE) lancets USE TO CHECK BLOOD SUGAR DAILY AND AS NEEDED 100 each 4  . metroNIDAZOLE (FLAGYL) 500 MG tablet Take 1 tablet (500 mg  total) by mouth 3 (three) times daily. 21 tablet 0  . ondansetron (ZOFRAN) 4 MG tablet Take 1 tablet (4 mg total) by mouth every 8 (eight) hours as needed for nausea or vomiting. 12 tablet 0  . triamterene-hydrochlorothiazide (MAXZIDE-25) 37.5-25 MG tablet TAKE ONE-HALF TABLET BY MOUTH ONCE DAILY. 45 tablet 1  . calcium carbonate (TUMS EX) 750 MG chewable tablet Chew 1 tablet by mouth as needed for heartburn.    . cholecalciferol (VITAMIN D) 1000 UNITS tablet Take 2,000 Units by mouth daily.     . ciprofloxacin (CIPRO) 500 MG tablet Take 1 tablet (500 mg total) by mouth 2 (two) times daily. (Patient not taking: Reported on 05/01/2016) 14 tablet 0  . esomeprazole (NEXIUM) 20 MG capsule Take 20 mg by mouth daily at 12 noon. Reported on 03/08/2015    . FREESTYLE LITE test strip USE TO CHECK BLOOD SUGAR DAILY AND AS NEEDED (Patient not taking: Reported on 05/01/2016) 100 each 4  . Methylcellulose, Laxative, (CITRUCEL PO) Take by mouth.    . Naproxen Sodium 220 MG CAPS Take 220-440 mg by mouth daily as needed (pain). Reported on 03/08/2015    .  RESTASIS 0.05 % ophthalmic emulsion Place 1 drop into both eyes 2 (two) times daily.      No facility-administered medications prior to visit.      EXAM:  BP 100/80 (BP Location: Left Arm, Patient Position: Sitting, Cuff Size: Normal)   Pulse 65   Temp 97.8 F (36.6 C) (Oral)   Ht 4\' 11"  (1.499 m)   Wt 141 lb 12.8 oz (64.3 kg)   BMI 28.64 kg/m   Body mass index is 28.64 kg/m.  GENERAL: vitals reviewed and listed above, alert, oriented, appears well hydrated and in no acute distress no icteric and non toxic  HEENT: atraumatic, conjunctiva  clear, no obvious abnormalities on inspection of external nose and ears OP : no lesion edema or exudate  Moist membranes  NECK: no obvious masses on inspection palpation  LUNGS: clear to auscultation bilaterally, no wheezes, rales or rhonchi, good air movement CV: HRRR, no clubbing cyanosis or  peripheral edema nl cap refill  abd soft but tender left and right gutters  No rebound.  bs quiet   No mass obvious  MS: moves all extremities without noticeable focal  abnormality PSYCH: pleasant and cooperative, no obvious depression or anxiety Lab Results  Component Value Date   WBC 21.9 (H) 04/28/2016   HGB 15.4 (H) 04/28/2016   HCT 45.6 04/28/2016   PLT 308 04/28/2016   GLUCOSE 112 (H) 04/28/2016   CHOL 166 07/10/2014   TRIG 101.0 07/10/2014   HDL 82.20 07/10/2014   LDLCALC 64 07/10/2014   ALT 15 04/28/2016   AST 24 04/28/2016   NA 137 04/28/2016   K 3.3 (L) 04/28/2016   CL 99 (L) 04/28/2016   CREATININE 1.13 (H) 04/28/2016   BUN 13 04/28/2016   CO2 25 04/28/2016   TSH 2.39 08/30/2015   HGBA1C 5.8 08/30/2015   MICROALBUR 1.6 04/26/2011   BP Readings from Last 3 Encounters:  05/01/16 100/80  04/29/16 112/63  03/10/16 124/80    ASSESSMENT AND PLAN:  Discussed the following assessment and plan:  Enteritis - with leukocytosis intermittent vomiting  abd stools   over  10 days or sx - Plan: Basic metabolic panel, CBC with  Differential/Platelet, Sedimentation rate, Magnesium, Ambulatory referral to Gastroenterology  Abdominal pain, unspecified abdominal location - Plan: Basic metabolic panel, CBC with Differential/Platelet, Sedimentation rate,  Magnesium, Ambulatory referral to Gastroenterology  Hypokalemia - Plan: Basic metabolic panel, CBC with Differential/Platelet, Sedimentation rate, Magnesium  Abnormal CT of the abdomen - sugg of sclerosing mesenteritis ?  - Plan: Ambulatory referral to Gastroenterology  fatigue    No one else sick    Husband sick w cough  Many of the symptoms are mild and subsided today I go ahead and take the antibiotic based on her white count and presentation and tenderness on her abdomen. Although she looks remarkably well for her history. No vomiting in the last 2 days. We'll plan referral to GI for new clinician to have her seen within the next 1-2 weeks. 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   .   -Patient advised to return or notify health care team  if  new concerns arise.  Patient Instructions  Repeating potassium in your blood count today. Go ahead and take the antibiotics. I will contact your new GI doctor Dr Jasper Loser  about being seen in the near future. Contact the GI department tomorrow to find out when your appointment will be. If you're getting worse in the meantime with severe abdominal pain and vomiting contact us or the medical team earlier.      Standley Brooking. Lorrain Rivers M.D.

## 2016-05-01 NOTE — Patient Instructions (Addendum)
Repeating potassium in your blood count today. Go ahead and take the antibiotics. I will contact your new GI doctor Dr Jasper Loser  about being seen in the near future. Contact the GI department tomorrow to find out when your appointment will be. If you're getting worse in the meantime with severe abdominal pain and vomiting contact us or the medical team earlier.

## 2016-05-02 ENCOUNTER — Encounter: Payer: Self-pay | Admitting: Physician Assistant

## 2016-05-02 LAB — MAGNESIUM: Magnesium: 2.1 mg/dL (ref 1.5–2.5)

## 2016-05-02 LAB — CBC WITH DIFFERENTIAL/PLATELET
BASOS PCT: 1.1 % (ref 0.0–3.0)
Basophils Absolute: 0.1 10*3/uL (ref 0.0–0.1)
EOS ABS: 0.2 10*3/uL (ref 0.0–0.7)
Eosinophils Relative: 2.2 % (ref 0.0–5.0)
HEMATOCRIT: 42.9 % (ref 36.0–46.0)
Hemoglobin: 14.2 g/dL (ref 12.0–15.0)
LYMPHS ABS: 2 10*3/uL (ref 0.7–4.0)
LYMPHS PCT: 21.6 % (ref 12.0–46.0)
MCHC: 33.1 g/dL (ref 30.0–36.0)
MCV: 87.2 fl (ref 78.0–100.0)
MONOS PCT: 3 % (ref 3.0–12.0)
Monocytes Absolute: 0.3 10*3/uL (ref 0.1–1.0)
NEUTROS ABS: 6.7 10*3/uL (ref 1.4–7.7)
NEUTROS PCT: 72.1 % (ref 43.0–77.0)
Platelets: 365 10*3/uL (ref 150.0–400.0)
RBC: 4.92 Mil/uL (ref 3.87–5.11)
RDW: 14.2 % (ref 11.5–15.5)
WBC: 9.3 10*3/uL (ref 4.0–10.5)

## 2016-05-02 LAB — BASIC METABOLIC PANEL
BUN: 10 mg/dL (ref 6–23)
CHLORIDE: 102 meq/L (ref 96–112)
CO2: 29 meq/L (ref 19–32)
Calcium: 9.5 mg/dL (ref 8.4–10.5)
Creatinine, Ser: 0.93 mg/dL (ref 0.40–1.20)
GFR: 63.32 mL/min (ref >60.00)
GLUCOSE: 86 mg/dL (ref 70–99)
POTASSIUM: 3.7 meq/L (ref 3.5–5.1)
SODIUM: 141 meq/L (ref 135–145)

## 2016-05-02 LAB — SEDIMENTATION RATE: Sed Rate: 7 mm/hr (ref 0–30)

## 2016-05-03 ENCOUNTER — Telehealth: Payer: Self-pay | Admitting: Emergency Medicine

## 2016-05-03 ENCOUNTER — Telehealth: Payer: Self-pay

## 2016-05-03 NOTE — Telephone Encounter (Signed)
Patient called to make an appointment yesterday. She is scheduled now for 05/15/16. Patient declined a sooner appointment.

## 2016-05-03 NOTE — Progress Notes (Signed)
Will contact patient and bring in for a urgent visit soon within next 1-2 weeks.  Thanks VN

## 2016-05-03 NOTE — Telephone Encounter (Signed)
Pt is in the ED she thinks she is having an reaction from the Flagyl and the Cipro that was sent in. Pt states she is having a scratchy throat and would like to know if something else can be called in.

## 2016-05-03 NOTE — Telephone Encounter (Signed)
Stop the medicine  And get more information allergic vs reflux      Would  Wait  Until better to  prescibe  A different med.  ? Ov if needed .

## 2016-05-03 NOTE — Telephone Encounter (Signed)
Pt states that she woke up with a sore throat and could barely swallow but is feeling okay now. She states that the medicines have a bad taste  And feels like acid is coming up at times. She also took both medicines today and is going to wait and see how she feels.  Pt states she does not need to come in this week since she has an appointment Monday and wants to wait. Advise pt that if she is not feeling better to call the office back.

## 2016-05-03 NOTE — Telephone Encounter (Signed)
-----   Message from Mauri Pole, MD sent at 05/03/2016 12:03 PM EDT -----   ----- Message ----- From: Burnis Medin, MD Sent: 05/01/2016   6:03 PM To: Mauri Pole, MD  Prev pat dr Olevia Perches assigned to you for routine colon  But had this episode   Can you arrange to see her in the next 1-2 weeks  I am sending in a referral  thanksWP

## 2016-05-05 NOTE — Progress Notes (Signed)
Chief Complaint  Patient presents with  . Follow-up    nasal congestion, cough, pt has been vomiting    HPI: Caitlin [name] 70 y.o. come in with husband  for  Fu a number o fisses  Reg ROV but  Enteritis dx in interim  Now has had    Uri congestion   Since last Wednesday April 18 .  Onset hard to swallow when in am  And then next day ur congestin and sinus more on left side   No feer   Vomiting after eating   At times   And not eating much  Transition from  Citalopram to lexapro   Still wants to sleep a lot but cant tell if from  New medical condition .  Took nausea pill and a sinus med and  Some better but still sick   bms like mush    And raw going to br.    Not back to her baseline which is still problematic diarrhea  ROS: See pertinent positives and negatives per HPI. No cp sob syncope  Bleeding   Past Medical History:  Diagnosis Date  . Allergy   . Diverticulosis of colon   . Erythema nodosum    Tends to be seasonal has had a negative chest x-ray in the past negative PPD  . GERD (gastroesophageal reflux disease)    History of stricture and dilatation and Nissen surgery  . Hypertension   . Meniere's disease    ? dx per dr Marcelline Mates  . Mood disorder ([place])     Family History  Problem Relation Age of Onset  . Cancer Mother   . Depression Mother   . Depression Father   . COPD Sister     Deceased 12/21/2009  . Nephrolithiasis    . Dementia Brother     Social History   Social History  . Marital status: Married    Spouse name: N/A  . Number of children: 1  . Years of education: N/A   Occupational History  . Retired    Social History Main Topics  . Smoking status: Never Smoker  . Smokeless tobacco: Never Used  . Alcohol use No  . Drug use: No  . Sexual activity: Not on file   Other Topics Concern  . Not on file   Social History Narrative   Unemployed   Married   Caitlin [name] [place] [facility]    Has grandchildren out of state   No pets   Sis. died December 21, 2009 COPD     Outpatient Medications Prior to Visit  Medication Sig Dispense Refill  . calcium carbonate (TUMS EX) 750 MG chewable tablet Chew 1 tablet by mouth as needed for heartburn.    . escitalopram (LEXAPRO) 10 MG tablet Take 1 tablet (10 mg total) by mouth daily. 30 tablet 3  . esomeprazole (NEXIUM) 20 MG capsule Take 20 mg by mouth daily at 12 noon. Reported on 03/08/2015    . FREESTYLE LITE test strip USE TO CHECK BLOOD SUGAR DAILY AND AS NEEDED 100 each 4  . KLOR-CON M10 10 MEQ tablet  TAKE 1 TABLET BY MOUTH EVERY OTHER DAY WITH FLUID TABLET 45 tablet 2  . Lancets (FREESTYLE) lancets USE TO CHECK BLOOD SUGAR DAILY AND AS NEEDED 100 each 4  . triamterene-hydrochlorothiazide (MAXZIDE-25) 37.5-25 MG tablet TAKE ONE-HALF TABLET BY MOUTH ONCE DAILY. 45 tablet 1  . cholecalciferol (VITAMIN D) 1000 UNITS tablet Take 2,000 Units by mouth daily.     [name]  Methylcellulose, Laxative, (CITRUCEL PO) Take by mouth.    . Naproxen Sodium 220 MG CAPS Take 220-440 mg by mouth daily as needed (pain). Reported on 03/08/2015     No facility-administered medications prior to visit.      EXAM:  BP 110/68 (BP Location: Right Arm, Patient Position: Sitting, Cuff Size: Normal)   Pulse 70   Temp 97.6 F (36.4 C) (Oral)   Ht 4\' 11"  (1.499 m)   Wt 141 lb 3.2 oz (64 kg)   BMI 28.52 kg/m   Body mass index is 28.52 kg/m.  GENERAL: vitals reviewed and listed above, alert, oriented, appears well hydrated and in no acute distress mod coryza blowing nose  ocass cough  Quiet respirations  HEENT: atraumatic, conjunctiva  clear, no obvious abnormalities on inspection of external nose and ears OP : no lesion edema or exudate  NECK: no obvious masses on inspection palpation  LUNGS: clear to auscultation bilaterally, no wheezes, rales or rhonchi, good air movement CV: HRRR, no clubbing cyanosis or  peripheral edema nl cap refill  Abdomen:  Sof,t norma to quiet l bowel sounds without hepatosplenomegaly, no guarding rebound or  masses no CVA tenderness MS: moves all extremities without noticeable focal  abnormality PSYCH: pleasant and cooperative, no obvious depression or anxiety Lab Results  Component Value Date   WBC 9.3 05/01/2016   HGB 14.2 05/01/2016   HCT 42.9 05/01/2016   PLT 365.0 05/01/2016   GLUCOSE 86 05/01/2016   CHOL 166 07/10/2014   TRIG 101.0 07/10/2014   HDL 82.20 07/10/2014   LDLCALC 64 07/10/2014   ALT 15 04/28/2016   AST 24 04/28/2016   NA 141 05/01/2016   K 3.7 05/01/2016   CL 102 05/01/2016   CREATININE 0.93 05/01/2016   BUN 10 05/01/2016   CO2 29 05/01/2016   TSH 2.39 08/30/2015   HGBA1C 5.8 08/30/2015   MICROALBUR 1.6 04/26/2011   BP Readings from Last 3 Encounters:  05/08/16 110/68  05/01/16 100/80  04/29/16 112/63   Wt Readings from Last 3 Encounters:  05/08/16 141 lb 3.2 oz (64 kg)  05/01/16 141 lb 12.8 oz (64.3 kg)  04/28/16 141 lb (64 kg)     ASSESSMENT AND PLAN:  Discussed the following assessment and plan:  Enteritis - see ct scan  Nausea and vomiting, intractability of vomiting not specified, unspecified vomiting type  Medication management  Anxiety state - uncertain if change in med was helpful cuase of interim ilnessess  URI, acute  Abnormal CT of the abdomen Has appt Gi April 30   May call to move up.   Interim ur infection  At this time not indicated for antibiotic  Local measures  Vomiting is never normal   ? If related to  Initial gi illness or new uri.  But no sig weight loss   Her baseline  Bowel problems  Have been problematic for a while and to be are not    Well defined   Also abnormal ct scan   Suggesting sclerosing  Bowel disease?  Need Gi eval with fresh evaluation  -Patient advised to return or notify health care team  if  new concerns arise.  Patient Instructions   Stay on the [place] .  For now .   If  Too much drainage .    Can try   Chlorpheniramine   Which causes sedation for pnd at night if needed .   less is best .  No   antibiotic for now  I dont think  the antibiotic caused an allergy reaction.   Call GI about getting earlier appt.  Ok to use the probiotic if  Wish  Anti  nausea med ok for now.     Lab Results  Component Value Date   WBC 9.3 05/01/2016   HGB 14.2 05/01/2016   HCT 42.9 05/01/2016   PLT 365.0 05/01/2016   GLUCOSE 86 05/01/2016   CHOL 166 07/10/2014   TRIG 101.0 07/10/2014   HDL 82.20 07/10/2014   LDLCALC 64 07/10/2014   ALT 15 04/28/2016   AST 24 04/28/2016   NA 141 05/01/2016   K 3.7 05/01/2016   CL 102 05/01/2016   CREATININE 0.93 05/01/2016   BUN 10 05/01/2016   CO2 29 05/01/2016   TSH 2.39 08/30/2015   HGBA1C 5.8 08/30/2015   MICROALBUR 1.6 04/26/2011          Shakinah Navis K. Jaella Weinert M.D.

## 2016-05-08 ENCOUNTER — Ambulatory Visit (INDEPENDENT_AMBULATORY_CARE_PROVIDER_SITE_OTHER): Payer: Medicare Other | Admitting: Internal Medicine

## 2016-05-08 ENCOUNTER — Encounter: Payer: Self-pay | Admitting: Internal Medicine

## 2016-05-08 VITALS — BP 110/68 | HR 70 | Temp 97.6°F | Ht 59.0 in | Wt 141.2 lb

## 2016-05-08 DIAGNOSIS — K529 Noninfective gastroenteritis and colitis, unspecified: Secondary | ICD-10-CM | POA: Diagnosis not present

## 2016-05-08 DIAGNOSIS — Z79899 Other long term (current) drug therapy: Secondary | ICD-10-CM | POA: Diagnosis not present

## 2016-05-08 DIAGNOSIS — F411 Generalized anxiety disorder: Secondary | ICD-10-CM | POA: Diagnosis not present

## 2016-05-08 DIAGNOSIS — R935 Abnormal findings on diagnostic imaging of other abdominal regions, including retroperitoneum: Secondary | ICD-10-CM | POA: Diagnosis not present

## 2016-05-08 DIAGNOSIS — R112 Nausea with vomiting, unspecified: Secondary | ICD-10-CM | POA: Diagnosis not present

## 2016-05-08 DIAGNOSIS — J069 Acute upper respiratory infection, unspecified: Secondary | ICD-10-CM | POA: Diagnosis not present

## 2016-05-08 MED ORDER — ONDANSETRON HCL 4 MG PO TABS: 4.0000 mg | ORAL_TABLET | Freq: Three times a day (TID) | ORAL | 0 refills | Status: AC | PRN

## 2016-05-08 NOTE — Patient Instructions (Addendum)
Stay on the [place] .  For now .   If  Too much drainage .    Can try   Chlorpheniramine   Which causes sedation for pnd at night if needed .   less is best .  No  antibiotic for now  I dont think  the antibiotic caused an allergy reaction.   Call GI about getting earlier appt.  Ok to use the probiotic if  Wish  Anti  nausea med ok for now.     Lab Results  Component Value Date   WBC 9.3 05/01/2016   HGB 14.2 05/01/2016   HCT 42.9 05/01/2016   PLT 365.0 05/01/2016   GLUCOSE 86 05/01/2016   CHOL 166 07/10/2014   TRIG 101.0 07/10/2014   HDL 82.20 07/10/2014   LDLCALC 64 07/10/2014   ALT 15 04/28/2016   AST 24 04/28/2016   NA 141 05/01/2016   K 3.7 05/01/2016   CL 102 05/01/2016   CREATININE 0.93 05/01/2016   BUN 10 05/01/2016   CO2 29 05/01/2016   TSH 2.39 08/30/2015   HGBA1C 5.8 08/30/2015   MICROALBUR 1.6 04/26/2011

## 2016-05-15 ENCOUNTER — Ambulatory Visit (INDEPENDENT_AMBULATORY_CARE_PROVIDER_SITE_OTHER): Payer: Medicare Other | Admitting: Physician Assistant

## 2016-05-15 ENCOUNTER — Other Ambulatory Visit: Payer: Self-pay

## 2016-05-15 ENCOUNTER — Encounter: Payer: Self-pay | Admitting: Physician Assistant

## 2016-05-15 ENCOUNTER — Other Ambulatory Visit (INDEPENDENT_AMBULATORY_CARE_PROVIDER_SITE_OTHER): Payer: Medicare Other

## 2016-05-15 VITALS — BP 120/64 | HR 76 | Ht 59.0 in | Wt 139.8 lb

## 2016-05-15 DIAGNOSIS — R112 Nausea with vomiting, unspecified: Secondary | ICD-10-CM

## 2016-05-15 DIAGNOSIS — K55019 Acute (reversible) ischemia of small intestine, extent unspecified: Secondary | ICD-10-CM | POA: Diagnosis not present

## 2016-05-15 DIAGNOSIS — K529 Noninfective gastroenteritis and colitis, unspecified: Secondary | ICD-10-CM

## 2016-05-15 DIAGNOSIS — R197 Diarrhea, unspecified: Secondary | ICD-10-CM | POA: Diagnosis not present

## 2016-05-15 DIAGNOSIS — R935 Abnormal findings on diagnostic imaging of other abdominal regions, including retroperitoneum: Secondary | ICD-10-CM

## 2016-05-15 LAB — C-REACTIVE PROTEIN: CRP: 1.1 mg/dL (ref 0.5–20.0)

## 2016-05-15 LAB — SEDIMENTATION RATE: Sed Rate: 21 mm/hr (ref 0–30)

## 2016-05-15 NOTE — Progress Notes (Signed)
Subjective:    Patient ID: Caitlin [name], female    DOB: 1945/09/27, 71 y.o.   MRN: [id number]  HPI Caitlin [name] is a pleasant 71 year old white female, known previously to Dr. Olevia Perches who would like to establish with Dr. Silverio Decamp. She has history of hypertension, Mnire's disease, GERD, diverticulosis, fibromyalgia and history of erythema nodosum. She is status post C-section hysterectomy, cholecystectomy and remote Nissen fundoplication. Last procedures were done in 2007 per Dr. Olevia Perches with finding of a 4 cm hiatal hernia and a loose Nissen wrap, colonoscopy at that same time showed a narrowed left colon, somewhat tortuous with multiple diverticuli. Patient says she has not been feeling well for several months with fatigue and complete lack of energy. Appetite has been okay but her diet has been very limited over the past 5 years she says because she has multiple intolerances to food. She says there are a multitude of foods that will cause her to have abdominal cramping and diarrhea so she primarily has been living on chicken, [place] and attained dose. Her weight has been stable. She was given a diagnosis of IBS in the past. She did have celiac markers done in 2010 which were negative. Patient had an acute episode on 04/21/2016 with what she thought was a stomach flu with abrupt onset of nausea vomiting diarrhea and abdominal pain. She was ill for about 3 days then the diarrhea and nausea subsided but she was left feeling weak and lethargic.  On 04/28/2016 acute symptoms recurred with nausea vomiting diarrhea and abdominal cramping and she went to the emergency room. WBC was elevated at 21.9 hemoglobin 14.  CT of the abdomen and pelvis was done which showed her to be status post Nissen fundoplication and cholecystectomy. 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 Patient was given a course of Cipro and Flagyl which she could not tolerate. She says the antibiotics made her feel terrible and she only took them for 2 days. She did develop a vaginal yeast infection which she's treating with Monistat. Acute symptoms recurred again on 05/06/2016 with nausea vomiting and mild diarrhea. She says this wasn't as bad as the first 2 episodes Since then she has been able to tolerate very bland foods, and does not have much appetite denies any nausea currently but feels very unsettled in her abdomen. She has not had a bowel movement over the past couple of days and just generally feels poorly. She had stopped taking her Nexium briefly and wonders if she can restart that. She asked me about what medication she had been prescribed in 2010 because she had an exacerbation of the erythema nodosum with that medication. It appears she was given Zantac twice a day.  Review of Systems Pertinent positive and negative review of systems were noted in the above HPI section.  All other review of systems was otherwise negative.  Outpatient Encounter Prescriptions as of 05/15/2016  Medication Sig  . calcium carbonate (TUMS EX) 750 MG chewable tablet Chew 1 tablet by mouth as needed for heartburn.  . cholecalciferol (VITAMIN D) 1000 UNITS tablet Take 2,000 Units by mouth daily.   [name] escitalopram (LEXAPRO) 10 MG tablet Take 1 tablet (10 mg total) by mouth daily.  [name] esomeprazole (NEXIUM) 20 MG capsule  Take 20 mg by mouth daily at 12 noon. Reported on 03/08/2015  . FREESTYLE LITE test strip USE TO CHECK BLOOD SUGAR DAILY AND AS NEEDED  . KLOR-CON M10 10 MEQ tablet  TAKE 1 TABLET BY MOUTH EVERY OTHER DAY WITH FLUID TABLET  . Lancets (FREESTYLE) lancets USE TO CHECK BLOOD SUGAR DAILY AND AS NEEDED  . Methylcellulose, Laxative, (CITRUCEL PO) Take by mouth.  .  triamterene-hydrochlorothiazide (MAXZIDE-25) 37.5-25 MG tablet TAKE ONE-HALF TABLET BY MOUTH ONCE DAILY.   No facility-administered encounter medications on file as of 05/15/2016.    Allergies  Allergen Reactions  . Mobic [Meloxicam] Diarrhea  . Aleve [Naproxen]   . Codeine Phosphate Other (See Comments)    Hallucinations  . Omeprazole-Sodium Bicarbonate   . Protonix [Pantoprazole Sodium] Diarrhea    Leg nodules  . Sulfamethoxazole Diarrhea and Nausea And Vomiting   Patient Active Problem List   Diagnosis Date Noted  . Memory loss 09/01/2014  . Depression 09/01/2014  . Generalized anxiety disorder 09/01/2014  . Hair loss 08/12/2013  . Medication management 08/12/2013  . Erythema nodosum 06/05/2012  . Screening-pulmonary TB 06/05/2012  . Elevated cholesterol 04/29/2012  . Osteoarthritis 11/28/2011  . Arthritis 10/28/2011  . Sleep disturbance 10/28/2011  . Visit for preventive health examination 04/27/2011  . GERD (gastroesophageal reflux disease)   . Prediabetes 04/19/2011  . Hand dermatitis 12/29/2010  . Hypotension 06/07/2010  . Vitamin B12 deficiency 05/13/2010  . [place] DISEASE 01/14/2010  . GASTROPARESIS 12/23/2008  . IRRITABLE BOWEL SYNDROME 12/23/2008  . DECREASED HEARING, LEFT EAR 11/27/2008  . LOOSE STOOLS 11/27/2008  . SHOULDER PAIN, LEFT 09/01/2008  . LEG CRAMPS, NOCTURNAL 11/11/2007  . BACK PAIN 10/09/2007  . SLEEP DISORDER 04/23/2007  . FATIGUE 03/12/2007  . DIVERTICULOSIS, COLON 12/10/2006  . CONSTIPATION 12/10/2006  . FIBROMYALGIA 12/10/2006  . INTESTINAL GAS 12/10/2006  . DEPRESSION 09/10/2006  . HYPERGLYCEMIA 08/15/2006  . HYPERTENSION 07/11/2006  . ALLERGIC RHINITIS 07/11/2006  . GERD 07/11/2006  . HIATAL HERNIA 07/11/2006  . ECZEMA 07/11/2006   Social History   Social History  . Marital status: Married    Spouse name: N/A  . Number of children: 1  . Years of education: N/A   Occupational History  . Retired    Social History Main  Topics  . Smoking status: Never Smoker  . Smokeless tobacco: Never Used  . Alcohol use No  . Drug use: No  . Sexual activity: Not on file   Other Topics Concern  . Not on file   Social History Narrative   Unemployed   Married   [place] of 2    Has grandchildren out of state   No pets   Sis. died 12-24-09 COPD    Ms. Hellstrom's family history includes COPD in her sister; Dementia in her brother; Depression in her father and mother; Hiatal hernia in her father; Kidney cancer in her mother.      Objective:    Vitals:   05/15/16 1413  BP: 120/64  Pulse: 76    Physical Exam  well-developed older white female in no acute distress, accompanied by her husband blood pressure 120/64 pulse 76, height 4 foot 11, weight 139, BMI 28.2. HEENT; nontraumatic normocephalic EOMI PERRLA sclera anicteric, Cardiovascular; regular rate and rhythm with S1-S2 no murmur or gallop, Pulmonary; clear bilaterally, Abdomen; soft, she has some mild rather diffuse tenderness there is no guarding or rebound no palpable mass or hepatosplenomegaly bowel sounds are present, no bruit, Rectal ;exam external only,  there is some mild perianal erythema, no inflamed external hemorrhoids, Extremities ;no clubbing cyanosis or edema skin warm and dry, Neuropsych; mood and affect appropriate       Assessment & Plan:   #81 71 year old female with almost 1 month history of recurrent episodes of abdominal pain nausea vomiting and diarrhea. In the background of this she has had malaise and poor energy over the past several months. 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  Etiology of her above symptoms is not entirely clear though do not think this was a simple infectious enteritis. Patient does have history of erythema nodosum and  wonder if she has other underlying autoimmune disorder causing a vasculitis/enteritis. 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  #2 history of chronic GERD status post remote Nissen-on PPI chronically #3 fibromyalgia #4 diverticulosis #5 history of IBS #6 Mnire's disease #7 status post C-section, hysterectomy and cholecystectomy  Plan; sedimentation rate, CRP, ANA and anti-DNA antibody Patient will be scheduled for CT of the abdomen with CT enterography to further delineate the small bowel and also assess for persistence of mesenteric abnormalities noted on recent scan. Patient will resume Nexium 20 mg by mouth daily She will need fairly soon follow-up visit with Dr. Silverio Decamp which may need to be a work in as no appointments available until May 30. She was given an appointment for May 30. If continued concern for sclerosing mesenteritis with CT enterography will need surgical consultation for laparoscopy and biopsy. (We briefly discussed potential need for repeat endoscopic evaluation and she is very anxious and leery about repeat colonoscopy due to tortuosity of her bowel, previous difficult colonoscopy, and personal friend who died from complications of colonoscopy with perforation.)     Amy S Esterwood PA-C 05/15/2016   Cc: Burnis Medin, MD

## 2016-05-15 NOTE — Patient Instructions (Addendum)
Nexium 20 mg daily.  Your physician has requested that you go to the basement for lab work before leaving today.  You have been scheduled for a CT scan of the abdomen and pelvis at [facility] ([address]---this is in the same building as [facility]).   You are scheduled on 05-19-16 at 2:15 pm. You should arrive 15 minutes prior to your appointment time for registration. Please follow the written instructions below on the day of your exam:  WARNING: IF YOU ARE ALLERGIC TO IODINE/X-RAY DYE, PLEASE NOTIFY RADIOLOGY IMMEDIATELY AT [phone]! YOU WILL BE GIVEN A 13 HOUR PREMEDICATION PREP.  Do not eat or drink anything after 11 am (4 hours prior to your test)  You may take any medications as prescribed with a small amount of water except for the following: Metformin, Glucophage, Glucovance, Avandamet, Riomet, Fortamet, Actoplus Met, Janumet, Glumetza or Metaglip. The above medications must be held the day of the exam AND 48 hours after the exam.  The purpose of you drinking the oral contrast is to aid in the visualization of your intestinal tract. The contrast solution may cause some diarrhea. Before your exam is started, you will be given a small amount of fluid to drink. Depending on your individual set of symptoms, you may also receive an intravenous injection of x-ray contrast/dye. Plan on being at [facility] for 30 minutes or longer, depending on the type of exam you are having performed.  This test typically takes 30-45 minutes to complete.  If you have any questions regarding your exam or if you need to reschedule, you may call the CT department at [phone] between the hours of 8:00 am and 5:00 pm, Monday-Friday.  ________________________________________________________________________ '

## 2016-05-16 LAB — ANA: ANA: NEGATIVE

## 2016-05-16 LAB — ANTI-DNA ANTIBODY, DOUBLE-STRANDED

## 2016-05-19 ENCOUNTER — Ambulatory Visit (INDEPENDENT_AMBULATORY_CARE_PROVIDER_SITE_OTHER)
Admission: RE | Admit: 2016-05-19 | Discharge: 2016-05-19 | Disposition: A | Discharge status: Home / Self Care | Payer: Medicare Other | Source: Ambulatory Visit | Attending: Physician Assistant | Admitting: Physician Assistant

## 2016-05-19 DIAGNOSIS — K55059 Acute (reversible) ischemia of intestine, part and extent unspecified: Secondary | ICD-10-CM

## 2016-05-19 DIAGNOSIS — K760 Fatty (change of) liver, not elsewhere classified: Secondary | ICD-10-CM | POA: Diagnosis not present

## 2016-05-19 DIAGNOSIS — R935 Abnormal findings on diagnostic imaging of other abdominal regions, including retroperitoneum: Secondary | ICD-10-CM

## 2016-05-19 DIAGNOSIS — K55019 Acute (reversible) ischemia of small intestine, extent unspecified: Secondary | ICD-10-CM | POA: Diagnosis not present

## 2016-05-19 MED ORDER — IOPAMIDOL (ISOVUE-300) INJECTION 61%: 100.0000 mL | Freq: Once | INTRAVENOUS | Status: DC | PRN

## 2016-05-23 NOTE — Progress Notes (Signed)
Reviewed and agree with documentation and assessment and plan. K. Veena Nandigam , MD   

## 2016-06-06 ENCOUNTER — Other Ambulatory Visit: Payer: Self-pay | Admitting: Internal Medicine

## 2016-06-14 ENCOUNTER — Encounter: Payer: Self-pay | Admitting: Gastroenterology

## 2016-06-14 ENCOUNTER — Ambulatory Visit (INDEPENDENT_AMBULATORY_CARE_PROVIDER_SITE_OTHER): Payer: Medicare Other | Admitting: Gastroenterology

## 2016-06-14 VITALS — BP 108/64 | HR 64 | Ht 59.0 in | Wt 139.5 lb

## 2016-06-14 DIAGNOSIS — R109 Unspecified abdominal pain: Secondary | ICD-10-CM

## 2016-06-14 DIAGNOSIS — K219 Gastro-esophageal reflux disease without esophagitis: Secondary | ICD-10-CM

## 2016-06-14 DIAGNOSIS — R140 Abdominal distension (gaseous): Secondary | ICD-10-CM

## 2016-06-14 DIAGNOSIS — K580 Irritable bowel syndrome with diarrhea: Secondary | ICD-10-CM

## 2016-06-14 DIAGNOSIS — K449 Diaphragmatic hernia without obstruction or gangrene: Secondary | ICD-10-CM | POA: Diagnosis not present

## 2016-06-14 MED ORDER — RIFAXIMIN 550 MG PO TABS: 550.0000 mg | ORAL_TABLET | Freq: Three times a day (TID) | ORAL | 0 refills | Status: DC

## 2016-06-14 NOTE — Progress Notes (Signed)
Caitlin [name]    [id number]    03-06-1945  Primary Care Physician:Panosh, Standley Brooking, MD  Referring Physician: Burnis Medin, MD [place], [place] [address]  Chief complaint: Abdominal pain, gas, diarrhea  HPI: 71 year old female previously followed by Dr. Olevia [name], last seen by Caitlin [name] on 05/15/2016. She recently had an episode of nausea, vomiting, diarrhea associated with severe abdominal cramps, had elevated WBC count 21. CT abdomen and pelvis 04/28/2016 showed mild thickening of multiple small bowel loops with hazy mesentery and several subcentimeter lymph nodes. She was given a course of Cipro and Flagyl but patient took them for only 2 days as she developed vaginal [place] infection and did not tolerate them.    Subsequent CT enterography 05/19/2016 did not show any acute findings suggestive of enteritis or sclerosing mesenteritis. She has herniation of the fundoplication wrap with recurrent large hiatal hernia. Mild hepatic steatosis.  Labs on 05/15/2016 were within normal limits with normal CRP and ESR. Negative ANA  Patient complains of persistent intermittent abdominal pain associated with bloating and excessive gas. Her diet is currently limited to chicken, [place], potatoes and bread. She feels salads or raw vegetables give her severe abdominal cramps and diarrhea.  Her husband patient doesn't do any exercise, barely get out of the house. He is Press photographer.  Patient complained of generalized body ache and myalgia. She is having pain in her right leg muscle because she has been driving for the past 1 week. Her husband had knee replacement surgery last week and he is not allowed to drive yet.  She is status post Nissen fundoplication with slipped Nissen, large hiatal hernia, diverticulosis, fibromyalgia and chronic GERD.  Patient had a difficult exam during her last colonoscopy and she is reluctant to undergo any endoscopic or  colonoscopy evaluation  EGD December 2008 with evidence of recurrent large hiatal hernia and slipped fundoplication  Colonoscopy September 2007 evidence of sigmoid diverticulosis with diverticular spasm and colonic tortuosity otherwise unremarkable   Outpatient Encounter Prescriptions as of 06/14/2016  Medication Sig  . calcium carbonate (TUMS EX) 750 MG chewable tablet Chew 1 tablet by mouth as needed for heartburn.  . cholecalciferol (VITAMIN D) 1000 UNITS tablet Take 2,000 Units by mouth daily.   [name] escitalopram (LEXAPRO) 10 MG tablet TAKE ONE TABLET BY MOUTH ONCE DAILY  . esomeprazole (NEXIUM) 20 MG capsule Take 20 mg by mouth daily at 12 noon. Reported on 03/08/2015  . FREESTYLE LITE test strip USE TO CHECK BLOOD SUGAR DAILY AND AS NEEDED  . KLOR-CON M10 10 MEQ tablet  TAKE 1 TABLET BY MOUTH EVERY OTHER DAY WITH FLUID TABLET  . Lancets (FREESTYLE) lancets USE TO CHECK BLOOD SUGAR DAILY AND AS NEEDED  . Methylcellulose, Laxative, (CITRUCEL PO) Take by mouth.  . triamterene-hydrochlorothiazide (MAXZIDE-25) 37.5-25 MG tablet TAKE ONE-HALF TABLET BY MOUTH ONCE DAILY.   No facility-administered encounter medications on file as of 06/14/2016.     Allergies as of 06/14/2016 - Review Complete 06/14/2016  Allergen Reaction Noted  . Mobic [meloxicam] Diarrhea 11/28/2011  . Aleve [naproxen]  05/15/2016  . Codeine phosphate Other (See Comments) 03/15/2006  . Omeprazole-sodium bicarbonate  12/23/2008  . Protonix [pantoprazole sodium] Diarrhea 06/05/2012  . Sulfamethoxazole Diarrhea and Nausea And Vomiting 03/15/2006    Past Medical History:  Diagnosis Date  . Allergy   . Anxiety   . Arthritis   . Depression   . Diverticulosis of colon   .  Enteritis   . Erythema nodosum    Tends to be seasonal has had a negative chest x-ray in the past negative PPD  . Fibromyalgia   . GERD (gastroesophageal reflux disease)    History of stricture and dilatation and Nissen surgery  . Hypertension   .  IBS (irritable bowel syndrome)   . Meniere's disease    ? dx per dr Marcelline Mates  . Mood disorder ([place])   . Sleep apnea    [facility]    Past Surgical History:  Procedure Laterality Date  . ABDOMINAL HYSTERECTOMY    . bone spur    . CESAREAN SECTION     x 1  . CHOLECYSTECTOMY    . ESOPHAGOGASTRODUODENOSCOPY     stricture and dilatation  . left shoulder surgery  06/08   x 2  . NISSEN FUNDOPLICATION    . ROTATOR CUFF REPAIR Left   . rt knee surgery     scope    Family History  Problem Relation Age of Onset  . Depression Mother   . Kidney cancer Mother   . Depression Father   . Hiatal hernia Father   . COPD Sister        Deceased Dec 17, 2009  . Nephrolithiasis Unknown   . Dementia Brother   . Colon cancer Neg Hx   . Esophageal cancer Neg Hx     Social History   Social History  . Marital status: Married    Spouse name: N/A  . Number of children: 1  . Years of education: N/A   Occupational History  . Retired    Social History Main Topics  . Smoking status: Never Smoker  . Smokeless tobacco: Never Used  . Alcohol use No  . Drug use: No  . Sexual activity: Not on file   Other Topics Concern  . Not on file   Social History Narrative   Unemployed   Married   [place] of 2    Has grandchildren out of state   No pets   Sis. died Dec 17, 2009 COPD      Review of systems: Review of Systems  Constitutional: Negative for fever and chills.  HENT: Negative.   Eyes: Negative for blurred vision.  Respiratory: Negative for cough, shortness of breath and wheezing.   Cardiovascular: Negative for chest pain and palpitations.  Gastrointestinal: as per HPI Genitourinary: Negative for dysuria, urgency, frequency and hematuria.  Musculoskeletal: Negative for myalgias, back pain and joint pain.  Skin: Negative for itching and rash.  Neurological: Negative for dizziness, tremors, focal weakness, seizures and loss of consciousness.  Endo/Heme/Allergies: Positive for  seasonal allergies.  Psychiatric/Behavioral: Negative for depression, suicidal ideas and hallucinations.  All other systems reviewed and are negative.   Physical Exam: Vitals:   06/14/16 0818  BP: 108/64  Pulse: 64   Body mass index is 28.18 kg/m. Gen:      No acute distress HEENT:  EOMI, sclera anicteric Neck:     No masses; no thyromegaly Lungs:    Clear to auscultation bilaterally; normal respiratory effort CV:         Regular rate and rhythm; no murmurs Abd:      + bowel sounds; soft, non-tender; no palpable masses, no distension Ext:    No edema; adequate peripheral perfusion Skin:      Warm and dry; no rash Neuro: alert and oriented x 3 Psych: normal mood and affect  Data Reviewed:  Reviewed labs, radiology imaging, old records and  pertinent past GI work up   Assessment and Plan/Recommendations:  71 year old female with fibromyalgia, irritable bowel syndrome, chronic GERD, status post Nissen fundoplication with slipped fundoplication and recurrent large hiatal hernia here for follow-up visit  Acute episode of enteritis likely infectious in etiology Subsequent CT enterography did not show any significant abnormality Workup negative for autoimmune disease or sclerosing mesenteritis  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 Rash with over-the-counter Zantac Discussed lifestyle modification and antireflux measures  Patient is reluctant to undergo any endoscopic or colonoscopic evaluation  Return in 1 month or sooner if needed  25 minutes was spent face-to-face with the patient. Greater  than 50% of the time used for counseling as well as treatment plan and follow-up of GERD, irritable bowel syndrome-diarrhea, bloating, abdominal cramps. She had multiple questions which were answered to her satisfaction  K. Denzil Magnuson , MD [phone] Mon-Fri 8a-5p [phone] after 5p, weekends, holidays  CC: Panosh, Standley Brooking, MD

## 2016-06-14 NOTE — Patient Instructions (Signed)
Low FODMAP diet given   We will send Xifaxian to your pharmacy to take for 14 days  Use IB gard 1 capsule three times a day as needed  Use VSL #3 1 capsule daily

## 2016-06-19 ENCOUNTER — Encounter: Payer: Self-pay | Admitting: Gastroenterology

## 2016-06-19 ENCOUNTER — Other Ambulatory Visit: Payer: Self-pay | Admitting: Internal Medicine

## 2016-06-20 ENCOUNTER — Encounter: Payer: Self-pay | Admitting: Physician Assistant

## 2016-06-20 ENCOUNTER — Other Ambulatory Visit: Payer: Self-pay

## 2016-06-20 ENCOUNTER — Telehealth: Payer: Self-pay

## 2016-06-20 MED ORDER — DICYCLOMINE HCL 10 MG PO CAPS: 10.0000 mg | ORAL_CAPSULE | Freq: Three times a day (TID) | ORAL | 0 refills | Status: DC | PRN

## 2016-06-20 NOTE — Telephone Encounter (Signed)
Please advise if okey to refill.

## 2016-06-20 NOTE — Telephone Encounter (Signed)
Patient has chronic intermittent lower abdominal pain and IBS-D. Please advise her to continue Xifaxan for 2 weeks. Take IB Gard (peppermint oil capsule) 1-2 capsule TID as needed. Thanks

## 2016-06-20 NOTE — Telephone Encounter (Signed)
Patient initiated this conversation through her My Chart account with "patient advise request." Called to the patient. She began has=ving a lower abdominal discomfort on Sunday (2 days ago). Monday her pain "intensified" and she had 2 stools. She describes these as formed. She did not sleep well last night. Now she has a pain pressure sensation "like you get when you need to move your bowels" but it is constant. It is a worse pain when she moves her bowels. She has only taken Xifaxan. Afebrile , no vomiting, a little nausea. No bloody stools. Please advise.

## 2016-06-20 NOTE — Telephone Encounter (Signed)
Patient is agreeable to this plan.  Pharmacy confirmed.

## 2016-06-20 NOTE — Telephone Encounter (Signed)
She agrees to continue taking the Xifaxan. She states she will not take IB gard. She has taken 1 capsule and developed indigestion about 30 minutes later! Is there anything else/ And she has not had a bowel movement today.

## 2016-06-20 NOTE — Telephone Encounter (Signed)
Ok, please send her anti spasmodic (Donnatol or Hyoscyamine 0.125 or Bentyl 10mg ) 1 tablet q8h prn which ever is covered by her insurance.Thanks

## 2016-06-20 NOTE — Telephone Encounter (Signed)
Bentyl is covered with a less than $3 co-pay. Patient did answer phone. Left her a voicemail.

## 2016-07-12 DIAGNOSIS — D2262 Melanocytic nevi of left upper limb, including shoulder: Secondary | ICD-10-CM | POA: Diagnosis not present

## 2016-07-12 DIAGNOSIS — D1801 Hemangioma of skin and subcutaneous tissue: Secondary | ICD-10-CM | POA: Diagnosis not present

## 2016-07-12 DIAGNOSIS — D224 Melanocytic nevi of scalp and neck: Secondary | ICD-10-CM | POA: Diagnosis not present

## 2016-07-12 DIAGNOSIS — L821 Other seborrheic keratosis: Secondary | ICD-10-CM | POA: Diagnosis not present

## 2016-07-12 DIAGNOSIS — L570 Actinic keratosis: Secondary | ICD-10-CM | POA: Diagnosis not present

## 2016-07-12 DIAGNOSIS — L820 Inflamed seborrheic keratosis: Secondary | ICD-10-CM | POA: Diagnosis not present

## 2016-07-12 DIAGNOSIS — L304 Erythema intertrigo: Secondary | ICD-10-CM | POA: Diagnosis not present

## 2016-07-12 DIAGNOSIS — L814 Other melanin hyperpigmentation: Secondary | ICD-10-CM | POA: Diagnosis not present

## 2016-07-12 DIAGNOSIS — L738 Other specified follicular disorders: Secondary | ICD-10-CM | POA: Diagnosis not present

## 2016-08-01 ENCOUNTER — Ambulatory Visit (INDEPENDENT_AMBULATORY_CARE_PROVIDER_SITE_OTHER): Payer: Medicare Other | Admitting: Gastroenterology

## 2016-08-01 ENCOUNTER — Encounter: Payer: Self-pay | Admitting: Gastroenterology

## 2016-08-01 VITALS — BP 110/74 | HR 80 | Ht 59.25 in | Wt 140.2 lb

## 2016-08-01 DIAGNOSIS — K219 Gastro-esophageal reflux disease without esophagitis: Secondary | ICD-10-CM | POA: Diagnosis not present

## 2016-08-01 DIAGNOSIS — Z1212 Encounter for screening for malignant neoplasm of rectum: Secondary | ICD-10-CM | POA: Diagnosis not present

## 2016-08-01 DIAGNOSIS — R140 Abdominal distension (gaseous): Secondary | ICD-10-CM

## 2016-08-01 DIAGNOSIS — R109 Unspecified abdominal pain: Secondary | ICD-10-CM

## 2016-08-01 DIAGNOSIS — K580 Irritable bowel syndrome with diarrhea: Secondary | ICD-10-CM | POA: Diagnosis not present

## 2016-08-01 DIAGNOSIS — Z1211 Encounter for screening for malignant neoplasm of colon: Secondary | ICD-10-CM | POA: Diagnosis not present

## 2016-08-01 MED ORDER — VSL#3 PO CAPS: ORAL_CAPSULE | ORAL | Status: DC

## 2016-08-01 MED ORDER — DICYCLOMINE HCL 10 MG PO CAPS: 10.0000 mg | ORAL_CAPSULE | Freq: Three times a day (TID) | ORAL | 11 refills | Status: DC | PRN

## 2016-08-01 NOTE — Progress Notes (Signed)
Caitlin [name]    [id number]    October 31, 1945  Primary Care Physician:Panosh, Standley Brooking, MD  Referring Physician: Burnis Medin, MD [place], [place] [address]  Chief complaint:  IBS- diarrhea, abdominal pain, bloating  HPI: 71 year old female with history of IBS predominant diarrhea here for follow-up visit. Patient reports some improvement of diarrhea with dietary changes. She is trying to avoid lactose. She was given low fodmap diet but she finds it overwhelming and restrictive and hasn't been following it. She completed a 14 day course of rifaximin and is currently taking the probiotic .Diarrhea has improved to 3-4 bowel movements daily. Denies any mucus or blood in stool. She continues to have abdominal cramps and excessive bloating. Denies any nausea or vomiting.  Patient remains reluctant to undergo colonoscopy   Outpatient Encounter Prescriptions as of 08/01/2016  Medication Sig  . calcium carbonate (TUMS EX) 750 MG chewable tablet Chew 1 tablet by mouth as needed for heartburn.  . cholecalciferol (VITAMIN D) 1000 UNITS tablet Take 2,000 Units by mouth daily.   [name] dicyclomine (BENTYL) 10 MG capsule Take 1 capsule (10 mg total) by mouth every 8 (eight) hours as needed for spasms.  [name] escitalopram (LEXAPRO) 10 MG tablet TAKE ONE TABLET BY MOUTH ONCE DAILY  . esomeprazole (NEXIUM) 20 MG capsule Take 20 mg by mouth daily at 12 noon. Reported on 03/08/2015  . FREESTYLE LITE test strip USE TO CHECK BLOOD SUGAR DAILY AND AS NEEDED  . KLOR-CON M10 10 MEQ tablet TAKE 1 TABLET BY MOUTH EVERY OTHER DAY WITH FLUID TABLET  . Lancets (FREESTYLE) lancets USE TO CHECK BLOOD SUGAR DAILY AND AS NEEDED  . Methylcellulose, Laxative, (CITRUCEL PO) Take by mouth.  . Probiotic Product (VSL#3 PO) Take 1 capsule by mouth daily.  [name] triamterene-hydrochlorothiazide (MAXZIDE-25) 37.5-25 MG tablet TAKE ONE-HALF TABLET BY MOUTH ONCE DAILY.  . [DISCONTINUED] rifaximin (XIFAXAN) 550 MG  TABS tablet Take 1 tablet (550 mg total) by mouth 3 (three) times daily. For 14 days   No facility-administered encounter medications on file as of 08/01/2016.     Allergies as of 08/01/2016 - Review Complete 08/01/2016  Allergen Reaction Noted  . Mobic [meloxicam] Diarrhea 11/28/2011  . Aleve [naproxen]  05/15/2016  . Codeine phosphate Other (See Comments) 03/15/2006  . Omeprazole-sodium bicarbonate  12/23/2008  . Protonix [pantoprazole sodium] Diarrhea 06/05/2012  . Sulfamethoxazole Diarrhea and Nausea And Vomiting 03/15/2006    Past Medical History:  Diagnosis Date  . Allergy   . Anxiety   . Arthritis   . Depression   . Diverticulosis of colon   . Enteritis   . Erythema nodosum    Tends to be seasonal has had a negative chest x-ray in the past negative PPD  . Fibromyalgia   . GERD (gastroesophageal reflux disease)    History of stricture and dilatation and Nissen surgery  . Hypertension   . IBS (irritable bowel syndrome)   . Meniere's disease    ? dx per dr Marcelline Mates  . Mood disorder ([place])   . Sleep apnea    [facility]    Past Surgical History:  Procedure Laterality Date  . ABDOMINAL HYSTERECTOMY    . bone spur    . CESAREAN SECTION     x 1  . CHOLECYSTECTOMY    . ESOPHAGOGASTRODUODENOSCOPY     stricture and dilatation  . left shoulder surgery  06/08   x 2  .  NISSEN FUNDOPLICATION    . ROTATOR CUFF REPAIR Left   . rt knee surgery     scope    Family History  Problem Relation Age of Onset  . Depression Mother   . Kidney cancer Mother   . Depression Father   . Hiatal hernia Father   . COPD Sister        Deceased 2010-01-09  . Nephrolithiasis Unknown   . Dementia Brother   . Colon cancer Neg Hx   . Esophageal cancer Neg Hx     Social History   Social History  . Marital status: Married    Spouse name: N/A  . Number of children: 1  . Years of education: N/A   Occupational History  . Retired    Social History Main Topics  . Smoking  status: Never Smoker  . Smokeless tobacco: Never Used  . Alcohol use No  . Drug use: No  . Sexual activity: Not on file   Other Topics Concern  . Not on file   Social History Narrative   Unemployed   Married   [place] of 2    Has grandchildren out of state   No pets   Sis. died 01/09/2010 COPD      Review of systems: Review of Systems  Constitutional: Negative for fever and chills.  HENT: Negative.   Eyes: Negative for blurred vision.  Respiratory: Negative for cough, shortness of breath and wheezing.   Cardiovascular: Negative for chest pain and palpitations.  Gastrointestinal: as per HPI Genitourinary: Negative for dysuria, urgency, frequency and hematuria.  Musculoskeletal: Negative for myalgias, back pain and joint pain.  Skin: Negative for itching and rash.  Neurological: Negative for dizziness, tremors, focal weakness, seizures and loss of consciousness.  Endo/Heme/Allergies: Positive for seasonal allergies.  Psychiatric/Behavioral: Negative for depression, suicidal ideas and hallucinations.  All other systems reviewed and are negative.   Physical Exam: Vitals:   08/01/16 1038  BP: 110/74  Pulse: 80   Body mass index is 28.09 kg/m. Gen:      No acute distress HEENT:  EOMI, sclera anicteric Neck:     No masses; no thyromegaly Lungs:    Clear to auscultation bilaterally; normal respiratory effort CV:         Regular rate and rhythm; no murmurs Abd:      + bowel sounds; soft, non-tender; no palpable masses, no distension Ext:    No edema; adequate peripheral perfusion Skin:      Warm and dry; no rash Neuro: alert and oriented x 3 Psych: normal mood and affect  Data Reviewed:  Reviewed labs, radiology imaging, old records and pertinent past GI work up   Assessment and Plan/Recommendations:  71 year old female with history of fibromyalgia status post cholecystectomy, GERD, hiatal hernia status post Nissen fundoplication with slipped Nissen and recurrent  hiatal hernia, irritable bowel syndrome predominant diarrhea here for follow-up visit  IBS-D: Some improvement with rifaximin Continue probiotic VSL#3 Discussed in detail low FODMAP diet Encourage patient to maintain a dairy with diet and symptom  Abdominal cramps: Okay to use Bentyl as needed  GERD: Continue PPI and antireflux measures  Colorectal cancer screening: Patient continues to be reluctant to undergo colonoscopy, average risk Will order Cologaurd  25 minutes was spent face-to-face with the patient. Greater than 50% of the time used for counseling as well as treatment plan and follow-up. She had multiple questions which were answered to her satisfaction  K. Denzil Magnuson , MD [phone]  Mon-Fri 8a-5p [phone] after 5p, weekends, holidays  CC: Panosh, Standley Brooking, MD

## 2016-08-01 NOTE — Patient Instructions (Addendum)
Your provider has ordered Cologuard testing as an option for colon cancer screening. This is performed by [employer] and may be out of network with your insurance. PRIOR to completing the test, it is YOUR responsibility to contact your insurance about covered benefits for this test. Your out of pocket expense could be anywhere from $0.00 to $649.00.   When you call to check coverage with your insurer, please provide the following information:   -The ONLY provider of Cologuard is [employer] code for Cologuard is [phone].  [employer] NPI # [id number]  -[employer] Tax ID # [id number]   We have already sent your demographic and insurance information to [employer] (phone number [phone]) and they should contact you within the next week regarding your test. If you have not heard from them within the next week, please call our office at [phone].  We have sent the following medications to your pharmacy for you to pick up at your convenience:  Bentyl  Please purchase VSL 112 over the counter and take daily. Ask pharmacists for assistance.  If you are age 33 or older, your body mass index should be between 23-30. Your Body mass index is 28.09 kg/m. If this is out of the aforementioned range listed, please consider follow up with your Primary Care Provider.  We have given you an IBS Diet and Symptoms Diary.  Please complete and bring to your next appointment.  If you are age 44 or younger, your body mass index should be between 19-25. Your Body mass index is 28.09 kg/m. If this is out of the aformentioned range listed, please consider follow up with your Primary Care Provider.

## 2016-08-09 DIAGNOSIS — Z1211 Encounter for screening for malignant neoplasm of colon: Secondary | ICD-10-CM | POA: Diagnosis not present

## 2016-08-09 DIAGNOSIS — Z1212 Encounter for screening for malignant neoplasm of rectum: Secondary | ICD-10-CM | POA: Diagnosis not present

## 2016-08-11 DIAGNOSIS — H40013 Open angle with borderline findings, low risk, bilateral: Secondary | ICD-10-CM | POA: Diagnosis not present

## 2016-08-11 DIAGNOSIS — H04123 Dry eye syndrome of bilateral lacrimal glands: Secondary | ICD-10-CM | POA: Diagnosis not present

## 2016-08-11 DIAGNOSIS — H43393 Other vitreous opacities, bilateral: Secondary | ICD-10-CM | POA: Diagnosis not present

## 2016-08-16 ENCOUNTER — Other Ambulatory Visit: Payer: Self-pay

## 2016-08-16 LAB — COLOGUARD: Cologuard: NEGATIVE

## 2016-09-05 ENCOUNTER — Other Ambulatory Visit: Payer: Self-pay | Admitting: Internal Medicine

## 2016-09-07 ENCOUNTER — Other Ambulatory Visit: Payer: Self-pay | Admitting: Internal Medicine

## 2016-09-07 DIAGNOSIS — Z1231 Encounter for screening mammogram for malignant neoplasm of breast: Secondary | ICD-10-CM

## 2016-09-19 ENCOUNTER — Ambulatory Visit
Admission: RE | Admit: 2016-09-19 | Discharge: 2016-09-19 | Disposition: A | Discharge status: Home / Self Care | Payer: Medicare Other | Source: Ambulatory Visit | Attending: Internal Medicine | Admitting: Internal Medicine

## 2016-09-19 DIAGNOSIS — Z1231 Encounter for screening mammogram for malignant neoplasm of breast: Secondary | ICD-10-CM

## 2016-10-02 ENCOUNTER — Ambulatory Visit (INDEPENDENT_AMBULATORY_CARE_PROVIDER_SITE_OTHER): Payer: Medicare Other | Admitting: Gastroenterology

## 2016-10-02 ENCOUNTER — Encounter: Payer: Self-pay | Admitting: Gastroenterology

## 2016-10-02 VITALS — BP 120/66 | HR 91 | Ht 59.25 in | Wt 139.0 lb

## 2016-10-02 DIAGNOSIS — K580 Irritable bowel syndrome with diarrhea: Secondary | ICD-10-CM

## 2016-10-02 DIAGNOSIS — Z1211 Encounter for screening for malignant neoplasm of colon: Secondary | ICD-10-CM | POA: Diagnosis not present

## 2016-10-02 DIAGNOSIS — R1310 Dysphagia, unspecified: Secondary | ICD-10-CM | POA: Diagnosis not present

## 2016-10-02 DIAGNOSIS — R197 Diarrhea, unspecified: Secondary | ICD-10-CM | POA: Diagnosis not present

## 2016-10-02 NOTE — Patient Instructions (Signed)
You have been scheduled for an endoscopy and colonoscopy. Please follow the written instructions given to you at your visit today. Please pick up your prep supplies at the pharmacy within the next 1-3 days. If you use inhalers (even only as needed), please bring them with you on the day of your procedure.  We have given you a Suprep sample kit today

## 2016-10-02 NOTE — Progress Notes (Signed)
Caitlin [name]    [id number]    [birth year]  Primary Care Physician:Panosh, Standley Brooking, MD  Referring Physician: Burnis Medin, MD [place], [place] [id number]  Chief complaint:  Dysphagia, irritable bowel syndrome  HPI:  40 yr F with h/o IBS-D here for follow up visit. Patient c/o intermittent difficulty swallowing for past few weeks, worse with solids. No food impactions but regurgitates intermittently. Continues to have intermittent episodes of diarrhea and abdominal cramps associated with bloating, somewhat improved compared to prior. Denies any mucus, melena or blood per rectum. Weight is stable. Patient is past due for colorectal cancer screening.   Outpatient Encounter Prescriptions as of 10/02/2016  Medication Sig  . calcium carbonate (TUMS EX) 750 MG chewable tablet Chew 1 tablet by mouth as needed for heartburn.  . cholecalciferol (VITAMIN D) 1000 UNITS tablet Take 2,000 Units by mouth daily.   [name] dicyclomine (BENTYL) 10 MG capsule Take 1 capsule (10 mg total) by mouth every 8 (eight) hours as needed for spasms.  [name] escitalopram (LEXAPRO) 10 MG tablet TAKE ONE TABLET BY MOUTH ONCE DAILY  . esomeprazole (NEXIUM) 20 MG capsule Take 20 mg by mouth daily at 12 noon. Reported on 03/08/2015  . FREESTYLE LITE test strip USE TO CHECK BLOOD SUGAR DAILY AND AS NEEDED  . KLOR-CON M10 10 MEQ tablet TAKE 1 TABLET BY MOUTH EVERY OTHER DAY WITH FLUID TABLET  . Lancets (FREESTYLE) lancets USE TO CHECK BLOOD SUGAR DAILY AND AS NEEDED  . Methylcellulose, Laxative, (CITRUCEL PO) Take by mouth.  . Probiotic Product (VSL#3) CAPS #112 B units daily  . triamterene-hydrochlorothiazide (MAXZIDE-25) 37.5-25 MG tablet TAKE ONE-HALF TABLET BY MOUTH ONCE DAILY.  . [DISCONTINUED] Probiotic Product (VSL#3 PO) Take 1 capsule by mouth daily.   No facility-administered encounter medications on file as of 10/02/2016.     Allergies as of 10/02/2016 - Review Complete 10/02/2016    Allergen Reaction Noted  . Mobic [meloxicam] Diarrhea 11/28/2011  . Aleve [naproxen]  05/15/2016  . Codeine phosphate Other (See Comments) 03/15/2006  . Omeprazole-sodium bicarbonate  12/23/2008  . Protonix [pantoprazole sodium] Diarrhea 06/05/2012  . Sulfamethoxazole Diarrhea and Nausea And Vomiting 03/15/2006    Past Medical History:  Diagnosis Date  . Allergy   . Anxiety   . Arthritis   . Depression   . Diverticulosis of colon   . Enteritis   . Erythema nodosum    Tends to be seasonal has had a negative chest x-ray in the past negative PPD  . Fibromyalgia   . GERD (gastroesophageal reflux disease)    History of stricture and dilatation and Nissen surgery  . Hypertension   . IBS (irritable bowel syndrome)   . Meniere's disease    ? dx per dr Marcelline Mates  . Mood disorder ([place])   . Sleep apnea    [facility]    Past Surgical History:  Procedure Laterality Date  . ABDOMINAL HYSTERECTOMY    . bone spur    . CESAREAN SECTION     x 1  . CHOLECYSTECTOMY    . ESOPHAGOGASTRODUODENOSCOPY     stricture and dilatation  . left shoulder surgery  06/08   x 2  . NISSEN FUNDOPLICATION    . ROTATOR CUFF REPAIR Left   . rt knee surgery     scope    Family History  Problem Relation Age of Onset  . Depression Mother   . Kidney cancer  Mother   . Depression Father   . Hiatal hernia Father   . COPD Sister        Deceased 12/17/2009  . Nephrolithiasis Unknown   . Dementia Brother   . Colon cancer Neg Hx   . Esophageal cancer Neg Hx     Social History   Social History  . Marital status: Married    Spouse name: N/A  . Number of children: 1  . Years of education: N/A   Occupational History  . Retired    Social History Main Topics  . Smoking status: Never Smoker  . Smokeless tobacco: Never Used  . Alcohol use No  . Drug use: No  . Sexual activity: Not on file   Other Topics Concern  . Not on file   Social History Narrative   Unemployed   Married   [place]  of 2    Has grandchildren out of state   No pets   Sis. died 2009/12/17 COPD      Review of systems: Review of Systems  Constitutional: Negative for fever and chills. Positive for lack of energy  HENT: Negative.   Eyes: Negative for blurred vision.  Respiratory: Negative for cough, shortness of breath and wheezing.   Cardiovascular: Negative for chest pain and palpitations.  Gastrointestinal: as per HPI Genitourinary: Negative for dysuria, urgency, frequency and hematuria.  Musculoskeletal: Negative for myalgias, back pain and joint pain.  Skin: Negative for itching and rash.  Neurological: Negative for dizziness, tremors, focal weakness, seizures and loss of consciousness.  Endo/Heme/Allergies: Positive for seasonal allergies.  Psychiatric/Behavioral: Positive for depression and anxiety, negative for suicidal ideas and hallucinations.  All other systems reviewed and are negative.   Physical Exam: Vitals:   10/02/16 1522  BP: 120/66  Pulse: 91   Body mass index is 27.84 kg/m. Gen:      No acute distress HEENT:  EOMI, sclera anicteric Neck:     No masses; no thyromegaly Lungs:    Clear to auscultation bilaterally; normal respiratory effort CV:         Regular rate and rhythm; no murmurs Abd:      + bowel sounds; soft, non-tender; no palpable masses, no distension Ext:    No edema; adequate peripheral perfusion Skin:      Warm and dry; no rash Neuro: alert and oriented x 3 Psych: normal mood and affect  Data Reviewed:  Reviewed labs, radiology imaging, old records and pertinent past GI work up   Assessment and Plan/Recommendations:  42 yr F with fibromyalgia, chronic GERD s/p Nissen fundoplication and slipped Nissen with recurrent hernia with c/o worsening intermittent dysphagia. Schedule for EGD for evaluation Patient is also past due for colorectal cancer screening, schedule for colonoscopy along with EGD The risks and benefits as well as alternatives of  endoscopic procedure(s) have been discussed and reviewed. All questions answered. The patient agrees to proceed.   25 minutes was spent face-to-face with the patient. Greater than 50% of the time used for counseling as well as treatment plan and follow-up. She had multiple questions which were answered to her satisfaction  K. Denzil Magnuson , MD [phone] Mon-Fri 8a-5p [phone] after 5p, weekends, holidays  CC: Panosh, Standley Brooking, MD

## 2016-10-04 ENCOUNTER — Ambulatory Visit (AMBULATORY_SURGERY_CENTER): Payer: Medicare Other | Admitting: Gastroenterology

## 2016-10-04 ENCOUNTER — Encounter: Payer: Self-pay | Admitting: Gastroenterology

## 2016-10-04 VITALS — BP 133/67 | HR 73 | Temp 98.2°F | Resp 17 | Ht 59.25 in | Wt 139.0 lb

## 2016-10-04 DIAGNOSIS — M797 Fibromyalgia: Secondary | ICD-10-CM | POA: Diagnosis not present

## 2016-10-04 DIAGNOSIS — Z1212 Encounter for screening for malignant neoplasm of rectum: Secondary | ICD-10-CM | POA: Diagnosis not present

## 2016-10-04 DIAGNOSIS — R197 Diarrhea, unspecified: Secondary | ICD-10-CM | POA: Diagnosis not present

## 2016-10-04 DIAGNOSIS — G4733 Obstructive sleep apnea (adult) (pediatric): Secondary | ICD-10-CM | POA: Diagnosis not present

## 2016-10-04 DIAGNOSIS — Z1211 Encounter for screening for malignant neoplasm of colon: Secondary | ICD-10-CM | POA: Diagnosis present

## 2016-10-04 DIAGNOSIS — E669 Obesity, unspecified: Secondary | ICD-10-CM | POA: Diagnosis not present

## 2016-10-04 DIAGNOSIS — R1310 Dysphagia, unspecified: Secondary | ICD-10-CM | POA: Diagnosis not present

## 2016-10-04 DIAGNOSIS — K219 Gastro-esophageal reflux disease without esophagitis: Secondary | ICD-10-CM | POA: Diagnosis not present

## 2016-10-04 DIAGNOSIS — I10 Essential (primary) hypertension: Secondary | ICD-10-CM | POA: Diagnosis not present

## 2016-10-04 MED ORDER — SODIUM CHLORIDE 0.9 % IV SOLN: 500.0000 mL | INTRAVENOUS | Status: DC

## 2016-10-04 NOTE — Op Note (Signed)
[name] Procedure Date: 10/04/2016 2:20 PM MRN: [id number] Endoscopist: Mauri Pole , MD Age: 71 Referring MD:  Date of Birth: [birth year] Gender: Female Account #: [id number] Procedure:                Colonoscopy Indications:              Screening for colorectal malignant neoplasm Medicines:                Monitored Anesthesia Care Procedure:                Pre-Anesthesia Assessment:                           - Prior to the procedure, a History and Physical                            was performed, and patient medications and                            allergies were reviewed. The patient's tolerance of                            previous anesthesia was also reviewed. The risks                            and benefits of the procedure and the sedation                            options and risks were discussed with the patient.                            All questions were answered, and informed consent                            was obtained. Prior Anticoagulants: The patient has                            taken no previous anticoagulant or antiplatelet                            agents. ASA Grade Assessment: II - A patient with                            mild systemic disease. After reviewing the risks                            and benefits, the patient was deemed in                            satisfactory condition to undergo the procedure.                           After obtaining informed consent, the colonoscope  was passed under direct vision. Throughout the                            procedure, the patient's blood pressure, pulse, and                            oxygen saturations were monitored continuously. The                            Colonoscope was introduced through the anus and                            advanced to the the cecum, identified by                            appendiceal orifice and  ileocecal valve. The                            colonoscopy was performed without difficulty. The                            patient tolerated the procedure well. The quality                            of the bowel preparation was excellent. The                            ileocecal valve, appendiceal orifice, and rectum                            were photographed. Scope In: 2:32:47 PM Scope Out: 1:61:09 PM Scope Withdrawal Time: 0 hours 8 minutes 47 seconds  Total Procedure Duration: 0 hours 14 minutes 1 second  Findings:                 The perianal and digital rectal examinations were                            normal.                           Scattered small and large-mouthed diverticula were                            found in the sigmoid colon, descending colon and                            ascending colon.                           Non-bleeding internal hemorrhoids were found during                            retroflexion. The hemorrhoids were small.  The exam was otherwise without abnormality. Complications:            No immediate complications. Estimated Blood Loss:     Estimated blood loss: none. Impression:               - Diverticulosis in the sigmoid colon, in the                            descending colon and in the ascending colon.                           - Non-bleeding internal hemorrhoids.                           - The examination was otherwise normal.                           - No specimens collected. Recommendation:           - Patient has a contact number available for                            emergencies. The signs and symptoms of potential                            delayed complications were discussed with the                            patient. Return to normal activities tomorrow.                            Written discharge instructions were provided to the                            patient.                           -  Resume previous diet.                           - Continue present medications.                           - Repeat colonoscopy in 10 years for screening                            purposes. Mauri Pole, MD 10/04/2016 2:56:40 PM This report has been signed electronically.

## 2016-10-04 NOTE — Op Note (Addendum)
[facility] Patient Name: Caitlin [name] Procedure Date: 10/04/2016 2:21 PM MRN: [id number] Endoscopist: Mauri Pole , MD Age: 71 Referring MD:  Date of Birth: 1945-03-05 Gender: Female Account #: [id number] Procedure:                Upper GI endoscopy Indications:              Dysphagia Medicines:                Monitored Anesthesia Care Procedure:                Pre-Anesthesia Assessment:                           - Prior to the procedure, a History and Physical                            was performed, and patient medications and                            allergies were reviewed. The patient's tolerance of                            previous anesthesia was also reviewed. The risks                            and benefits of the procedure and the sedation                            options and risks were discussed with the patient.                            All questions were answered, and informed consent                            was obtained. Prior Anticoagulants: The patient has                            taken no previous anticoagulant or antiplatelet                            agents. ASA Grade Assessment: II - A patient with                            mild systemic disease. After reviewing the risks                            and benefits, the patient was deemed in                            satisfactory condition to undergo the procedure.                           After obtaining informed consent, the endoscope was  passed under direct vision. Throughout the                            procedure, the patient's blood pressure, pulse, and                            oxygen saturations were monitored continuously. The                            Endoscope was introduced through the mouth, and                            advanced to the second part of duodenum. The upper                            GI endoscopy was accomplished without  difficulty.                            The patient tolerated the procedure well. Scope In: Scope Out: Findings:                 The esophagus was normal.                           A 6 cm hiatal hernia was present.                           Evidence of a Nissen fundoplication was found in                            the cardia. The wrap appeared to have slipped with                            recurrent hernia                           The examined duodenum was normal. Complications:            No immediate complications. Estimated Blood Loss:     Estimated blood loss: none. Impression:               - Normal esophagus.                           - 6 cm hiatal hernia.                           - A Nissen fundoplication was found. The wrap                            appears to have slipped with recurrent hernia.                           - Normal examined duodenum.                           - No specimens  collected. Recommendation:           - Patient has a contact number available for                            emergencies. The signs and symptoms of potential                            delayed complications were discussed with the                            patient. Return to normal activities tomorrow.                            Written discharge instructions were provided to the                            patient.                           - Resume previous diet.                           - Continue present medications. Mauri Pole, MD 10/04/2016 2:54:03 PM This report has been signed electronically.

## 2016-10-04 NOTE — Patient Instructions (Signed)
YOU HAD AN ENDOSCOPIC PROCEDURE TODAY AT [facility]:   Refer to the procedure report that was given to you for any specific questions about what was found during the examination.  If the procedure report does not answer your questions, please call your gastroenterologist to clarify.  If you requested that your care partner not be given the details of your procedure findings, then the procedure report has been included in a sealed envelope for you to review at your convenience later.  YOU SHOULD EXPECT: Some feelings of bloating in the abdomen. Passage of more gas than usual.  Walking can help get rid of the air that was put into your GI tract during the procedure and reduce the bloating. If you had a lower endoscopy (such as a colonoscopy or flexible sigmoidoscopy) you may notice spotting of blood in your stool or on the toilet paper. If you underwent a bowel prep for your procedure, you may not have a normal bowel movement for a few days.  Please Note:  You might notice some irritation and congestion in your nose or some drainage.  This is from the oxygen used during your procedure.  There is no need for concern and it should clear up in a day or so.  SYMPTOMS TO REPORT IMMEDIATELY:   Following lower endoscopy (colonoscopy or flexible sigmoidoscopy):  Excessive amounts of blood in the stool  Significant tenderness or worsening of abdominal pains  Swelling of the abdomen that is new, acute  Fever of 100F or higher   Following upper endoscopy (EGD)  Vomiting of blood or coffee ground material  New chest pain or pain under the shoulder blades  Painful or persistently difficult swallowing  New shortness of breath  Fever of 100F or higher  Black, tarry-looking stools  For urgent or emergent issues, a gastroenterologist can be reached at any hour by calling [phone].   DIET:  We do recommend a small meal at first, but then you may proceed to your regular diet.  Drink  plenty of fluids but you should avoid alcoholic beverages for 24 hours.  ACTIVITY:  You should plan to take it easy for the rest of today and you should NOT DRIVE or use heavy machinery until tomorrow (because of the sedation medicines used during the test).    FOLLOW UP: Our staff will call the number listed on your records the next business day following your procedure to check on you and address any questions or concerns that you may have regarding the information given to you following your procedure. If we do not reach you, we will leave a message.  However, if you are feeling well and you are not experiencing any problems, there is no need to return our call.  We will assume that you have returned to your regular daily activities without incident.  If any biopsies were taken you will be contacted by phone or by letter within the next 1-3 weeks.  Please call us at [phone] if you have not heard about the biopsies in 3 weeks.    SIGNATURES/CONFIDENTIALITY: You and/or your care partner have signed paperwork which will be entered into your electronic medical record.  These signatures attest to the fact that that the information above on your After Visit Summary has been reviewed and is understood.  Full responsibility of the confidentiality of this discharge information lies with you and/or your care-partner.  Hiatal hernia information given  Diverticulosis and hemorrhoid information given.

## 2016-10-04 NOTE — Progress Notes (Signed)
Report to PACU, RN, vss, BBS= Clear.  

## 2016-10-05 ENCOUNTER — Telehealth: Payer: Self-pay

## 2016-10-05 ENCOUNTER — Ambulatory Visit (INDEPENDENT_AMBULATORY_CARE_PROVIDER_SITE_OTHER): Payer: Medicare Other

## 2016-10-05 DIAGNOSIS — Z23 Encounter for immunization: Secondary | ICD-10-CM | POA: Diagnosis not present

## 2016-10-05 NOTE — Telephone Encounter (Signed)
  Follow up Call-  Call back number 10/04/2016  Post procedure Call Back phone  # [phone] hm  Permission to leave phone message Yes  Some recent data might be hidden     Patient questions:  Do you have a fever, pain , or abdominal swelling? No. Pain Score  0 *  Have you tolerated food without any problems? Yes.    Have you been able to return to your normal activities? Yes.    Do you have any questions about your discharge instructions: Diet   No. Medications  No. Follow up visit  No.  Do you have questions or concerns about your Care? No.  Actions: * If pain score is 4 or above: No action needed, pain <4.

## 2016-10-06 ENCOUNTER — Encounter: Payer: Self-pay | Admitting: Internal Medicine

## 2016-10-07 ENCOUNTER — Other Ambulatory Visit: Payer: Self-pay | Admitting: Internal Medicine

## 2016-10-10 NOTE — Addendum Note (Signed)
Addended by: Virl Cagey on: 10/10/2016 04:39 PM   Modules accepted: Orders

## 2016-10-10 NOTE — Telephone Encounter (Signed)
This was refilled today by Dr Sarajane Jews. Nothing further needed.

## 2016-10-10 NOTE — Telephone Encounter (Signed)
Pt following up on request for refill  escitalopram (LEXAPRO) 10 MG tablet  Pt will be out tomorrow.  [place], [place] - [address]

## 2016-10-10 NOTE — Telephone Encounter (Signed)
Last filled 06/07/16, #30 x 3 refills Last seen 05/08/16, no changes made to medication   Dr Sarajane Jews please advise if you are okay with refilling this x 1 month while Dr Regis Bill is not available. Thanks.

## 2016-10-12 ENCOUNTER — Ambulatory Visit: Payer: Medicare Other

## 2016-10-18 NOTE — Progress Notes (Signed)
Subjective:   Caitlin [name] is a 71 y.o. female who presents for Medicare Annual/Subsequent preventive examination.     The Patient was informed that the wellness visit is to identify future health risk and educate and initiate measures that can reduce risk for increased disease through the lifespan.    Annual Wellness Assessment  Reports health as fair  Preventive Screening -Counseling & Management  Medicare Annual Preventive Care Visit - Subsequent Last OV 04/2016 Dr. Delice Lesch did MMSE 2/23; wnl  Colonoscopy 09/2016 (cologuard 07/2016)  Mammogram 09/2016 -  Bone density 03/2001 - does not think she had one May repeat one in the future  Educated on the need for one at 84; will discuss with Dr.Panosh  VS reviewed;   Diet  Eat chicken, [place], has stopped eating beef Potatoes No fodmap per Dr. Collene Mares  Very difficulty to stay one Tries to stay close as possible to this   BMI-28   Exercise- does not exercise Walks a lot; stays on her feet   Eye exam 10/2015  Dental -no dental issues  Stressors: no  Sleep patterns: in general; hard to go to sleep Hard to get up in the am      Cardiac Risk Factors Addressed Hyperlipidemia -chol/hdl ratio 2; chol 166; HDL 82 trig 101 Pre-diabetes A1c 6.3 in 2015; 5.7 in 2016; 5.8 in 2017 Is on a special diet which limits sugar    Advanced Directives-  Patient Care Team: Burnis Medin, MD as PCP - General Ernesto Rutherford Maudry Mayhew, MD Lafayette Dragon, MD (Inactive) (Gastroenterology) Jarome Matin, MD as Consulting Physician (Dermatology) Monna Fam, MD as Attending Physician (Ophthalmology) Elsie Saas, MD as Attending Physician (Orthopedic Surgery) Sydnee Levans, MD as Consulting Physician (Dermatology)       Objective:    Vitals: BP 116/60   Pulse 66   Ht 4' 11.5" (1.511 m)   Wt 139 lb 6 oz (63.2 kg)   SpO2 94%   BMI 27.68 kg/m   Body mass index is 27.68 kg/m.  Tobacco History  Smoking Status  . Never Smoker    Smokeless Tobacco  . Never Used     Counseling given: Yes   Past Medical History:  Diagnosis Date  . Allergy   . Anxiety   . Arthritis   . Blood transfusion without reported diagnosis   . Cataract    bil eyes  . Depression   . Diabetes mellitus without complication (HCC)    pre-diabetic, no meds, diet controlled  . Diverticulosis of colon   . Enteritis   . Erythema nodosum    Tends to be seasonal has had a negative chest x-ray in the past negative PPD  . Fibromyalgia   . GERD (gastroesophageal reflux disease)    History of stricture and dilatation and Nissen surgery  . Hypertension   . IBS (irritable bowel syndrome)   . Meniere's disease    ? dx per dr Marcelline Mates  . Mood disorder ([place])   . Neuromuscular disorder ([place])    fibromylagia  . Osteopenia   . Sleep apnea    [facility].  pt does not wear a c-pap.   Past Surgical History:  Procedure Laterality Date  . ABDOMINAL HYSTERECTOMY    . bone spur    . CESAREAN SECTION     x 1  . CHOLECYSTECTOMY    . COLONOSCOPY    . ESOPHAGOGASTRODUODENOSCOPY     stricture and dilatation  . left shoulder surgery  06/08  x 2  . NISSEN FUNDOPLICATION    . ROTATOR CUFF REPAIR Left   . rt knee surgery     scope  . UPPER GASTROINTESTINAL ENDOSCOPY     Family History  Problem Relation Age of Onset  . Depression Mother   . Kidney cancer Mother   . Depression Father   . Hiatal hernia Father   . COPD Sister        Deceased 01/04/10  . Nephrolithiasis Unknown   . Dementia Brother   . Colon cancer Neg Hx   . Esophageal cancer Neg Hx   . Pancreatic cancer Neg Hx   . Rectal cancer Neg Hx   . Stomach cancer Neg Hx    History  Sexual Activity  . Sexual activity: Not on file    Outpatient Encounter Prescriptions as of 10/19/2016  Medication Sig  . calcium carbonate (TUMS EX) 750 MG chewable tablet Chew 1 tablet by mouth as needed for heartburn.  . cholecalciferol (VITAMIN D) 1000 UNITS tablet Take 2,000 Units by  mouth daily.   [name] dicyclomine (BENTYL) 10 MG capsule Take 1 capsule (10 mg total) by mouth every 8 (eight) hours as needed for spasms.  [name] escitalopram (LEXAPRO) 10 MG tablet TAKE 1 TABLET BY MOUTH ONCE DAILY  . esomeprazole (NEXIUM) 20 MG capsule Take 20 mg by mouth daily at 12 noon. Reported on 03/08/2015  . FREESTYLE LITE test strip USE TO CHECK BLOOD SUGAR DAILY AND AS NEEDED  . KLOR-CON M10 10 MEQ tablet TAKE 1 TABLET BY MOUTH EVERY OTHER DAY WITH FLUID TABLET  . Lancets (FREESTYLE) lancets USE TO CHECK BLOOD SUGAR DAILY AND AS NEEDED  . Probiotic Product (VSL#3) CAPS #112 B units daily  . triamterene-hydrochlorothiazide (MAXZIDE-25) 37.5-25 MG tablet TAKE 1/2 (ONE-HALF) TABLET BY MOUTH ONCE DAILY   No facility-administered encounter medications on file as of 10/19/2016.     Activities of Daily Living No flowsheet data found.  Patient Care Team: Panosh, Standley Brooking, MD as PCP - General Ernesto Rutherford Maudry Mayhew, MD Lafayette Dragon, MD (Inactive) (Gastroenterology) Jarome Matin, MD as Consulting Physician (Dermatology) Monna Fam, MD as Attending Physician (Ophthalmology) Elsie Saas, MD as Attending Physician (Orthopedic Surgery) Sydnee Levans, MD as Consulting Physician (Dermatology)   Assessment:     Exercise Activities and Dietary recommendations Agreed to start walking at least 2 days a week so she can build endurance to take her son to [employer]    . Exercise 150 minutes per week (moderate activity)          Walking longer distances would assist you to go [place] your [place] IBS is limiting; has a fitness center in the neighborhood  Will try to go x2 days       Fall Risk Fall Risk  10/19/2016 03/10/2016 08/30/2015 05/05/2012 04/29/2012  Falls in the past year? No No No No No   Depression Screen PHQ 2/9 Scores 10/19/2016 08/30/2015 05/05/2012 04/29/2012  PHQ - 2 Score 0 2 1 0    Cognitive Function MMSE - Mini Mental State Exam 03/10/2016 03/08/2015 09/01/2014   Orientation to time 5 5 5   Orientation to Place 5 5 5   Registration 3 3 3   Attention/ Calculation 4 5 5   Recall 3 3 3   Language- name 2 objects 2 2 2   Language- repeat 1 1 1   Language- follow 3 step command 3 3 3   Language- read & follow direction 1 1 1   Write a  sentence 1 1 1   Copy design 1 1 1   Total score 29 30 30       Declines MMSE repeat   Immunization History  Administered Date(s) Administered  . H1N1 03/09/2008  . Influenza Split 11/08/2010, 10/17/2011  . Influenza Whole 11/14/2006, 10/09/2007, 10/21/2008, 10/19/2009  . Influenza, High Dose Seasonal PF 10/06/2014, 10/01/2015, 10/05/2016  . Influenza,inj,Quad PF,6+ Mos 10/11/2012, 10/14/2013  . PPD Test 06/05/2012  . Pneumococcal Conjugate-13 11/05/2012  . Pneumococcal Polysaccharide-23 03/09/2008, 10/06/2014  . Tdap 10/27/2011  . Zoster 08/08/2010   Screening Tests Health Maintenance  Topic Date Due  . MAMMOGRAM  09/20/2018  . TETANUS/TDAP  10/26/2021  . COLONOSCOPY  10/05/2026  . INFLUENZA VACCINE  Completed  . DEXA SCAN  Completed  . Hepatitis C Screening  Completed  . PNA vac Low Risk Adult  Completed      Plan:     PCP Notes Health Maintenance The patient had a DEXA scan in 2003 and and discussed repeating it at 35 she is now 71. she will consider that and discussed with Dr. Al Decant regarding shingrix   Abnormal Screens  Pre diabetes discussed but she is on limited sugar diet by Dr. Collene Mares   Referrals   to have a hearing eval and may go to [facility]. Given resources for a free hearing aid   Patient concerns; Please make an apt with Dr. Regis Bill to fup on med; ( lexapro) before 10/24 as last rx was for no more refills until OV.  Your other rx maxide was refilled and at the pharamacy  I was told by pharmacy that the bentyl  has been re-ordered  The patient will pick up these meds  C/o of pain in left knee when going down stairs. States this is her good knee and will discuss with Dr.  Regis Bill. Encouraging her to exercise;   Nurse Concerns; Needs to start walking more;    Next PCP apt To make an apt when leaving today with Dr. Regis Bill as her Lexapro will need refill prior to 10/24 Denies depression today, but affect slow;       I have personally reviewed and noted the following in the patient's chart:   . Medical and social history . Use of alcohol, tobacco or illicit drugs  . Current medications and supplements . Functional ability and status . Nutritional status . Physical activity . Advanced directives . List of other physicians . Hospitalizations, surgeries, and ER visits in previous 12 months . Vitals . Screenings to include cognitive, depression, and falls . Referrals and appointments  In addition, I have reviewed and discussed with patient certain preventive protocols, quality metrics, and best practice recommendations. A written personalized care plan for preventive services as well as general preventive health recommendations were provided to patient.     ULAGT,XMIWO, RN  10/19/2016

## 2016-10-19 ENCOUNTER — Ambulatory Visit (INDEPENDENT_AMBULATORY_CARE_PROVIDER_SITE_OTHER): Payer: Medicare Other

## 2016-10-19 VITALS — BP 116/60 | HR 66 | Ht 59.5 in | Wt 139.4 lb

## 2016-10-19 DIAGNOSIS — Z0000 Encounter for general adult medical examination without abnormal findings: Secondary | ICD-10-CM

## 2016-10-19 NOTE — Progress Notes (Signed)
Signed in PCP absence. Agree with PCP Follow up.

## 2016-10-19 NOTE — Patient Instructions (Addendum)
[name] , Thank you for taking time to come for your Medicare Wellness Visit. I appreciate your ongoing commitment to your health goals. Please review the following plan we discussed and let me know if I can assist you in the future.   Please make an apt with Dr. Regis Bill to fup on med; ( lexapro) before 10/24  Your other rx maxide was refilled and at the pharamacy I was told by pharmacy that the bentyl  has been re-ordered   To note, you had a dexa scan 2003; generally will recheck bone density 71yo. Should have been checked in 2013  Hearing eval is free per Medicare Deaf & Hard of Hearing Division Services - can assist with hearing aid x 1  No reviews  [facility]  [place] [address]  [phone] - denise Amedeo Plenty   Shingrix is a vaccine for the prevention of Shingles in Adults 108 and older.  If you are on Medicare, you can request a prescription from your doctor to be filled at a pharmacy.  Please check with your benefits regarding applicable copays or out of pocket expenses.  The Shingrix is given in 2 vaccines approx 8 weeks apart. You must receive the 2nd dose prior to 6 months from receipt of the first.     These are the goals we discussed: Goals    . Exercise 150 minutes per week (moderate activity)          Walking longer distances would assist you to go [place] with your grandson IBS is limiting; has a fitness center in the neighborhood  Will try to go x2 days        This is a list of the screening recommended for you and due dates:  Health Maintenance  Topic Date Due  . Mammogram  09/20/2018  . Tetanus Vaccine  10/26/2021  . Colon Cancer Screening  10/05/2026  . Flu Shot  Completed  . DEXA scan (bone density measurement)  Completed  .  Hepatitis C: One time screening is recommended by Center for Disease Control  (CDC) for  adults born from [birth year] through 1964.   Completed  . Pneumonia vaccines  Completed    Prevention of falls: Remove rugs or any  tripping hazards in the home Use Non slip mats in bathtubs and showers Placing grab bars next to the toilet and or shower Placing handrails on both sides of the stair way Adding extra lighting in the home.   Personal safety issues reviewed:  1. Consider starting a community watch program per [facility] 2.  Changes batteries is smoke detector and/or carbon monoxide detector  3.  If you have firearms; keep them in a safe place 4.  Wear protection when in the sun; Always wear sunscreen or a hat; It is good to have your doctor check your skin annually or review any new areas of concern 5. Driving safety; Keep in the right lane; stay 3 car lengths behind the car in front of you on the highway; look 3 times prior to pulling out; carry your cell phone everywhere you go!    Learn about the Yellow Dot program:  The program allows first responders at your emergency to have access to who your physician is, as well as your medications and medical conditions.  Citizens requesting the Yellow Dot Packages should contact Master Corporal Nunzio Cobbs at the [facility] [phone] for the first week of the program and beginning  the week after Easter citizens should contact their Scientist, physiological.       Screening for Type 2 Diabetes A screening test for type 2 diabetes (type 2 diabetes mellitus) is a blood test to measure your blood sugar (glucose) level. This test is done to check for early signs of diabetes, before you develop symptoms. Type 2 diabetes is a long-term (chronic) disease that occurs when the pancreas does not make enough of a hormone called insulin. This results in high blood glucose levels, which can cause many complications. You may be screened for type 2 diabetes as part of your regular health care, especially if you have a high risk for diabetes. Screening can help identify type 2 diabetes at its early stage (prediabetes). Identifying and  treating prediabetes may delay or prevent development of type 2 diabetes. What are the risk factors for type 2 diabetes? The following factors may make you more likely to develop type 2 diabetes:  Having a parent or sibling (first-degree relative) who has diabetes.  Being overweight or obese.  Being of American-Indian, [place], Hispanic, Latino, Asian, or African-American descent.  Not getting enough exercise.  Being older than 77.  Having a history of diabetes during pregnancy (gestational diabetes).  Having low levels of good cholesterol (HDL-C) or high levels of blood fats (triglycerides).  Having high blood glucose in a previous blood test.  Having high blood pressure.  Having certain diseases or conditions, including: ? Acanthosis nigricans. This is a condition that causes dark skin on the neck, armpits, and groin. ? Polycystic ovary syndrome (PCOS). ? Heart disease.  Having delivered a baby who weighed more than 9 lb (4.1 kg).  Who should be screened for type 2 diabetes? Adults  Adults age 42 and older. These adults should be screened at least once every three years.  Adults who are younger than 41, overweight, and have at least one other risk factor. These adults should be screened at least once every three years.  Adults who have normal blood glucose levels and two or more risk factors. These adults may be screened once every year (annually).  Women who have had gestational diabetes in the past. These women should be screened at least once every three years.  Pregnant women who have risk factors. These women should be screened at their first prenatal visit.  Pregnant women with no risk factors. These women should be screened between weeks 24 and 28 of pregnancy. Children and adolescents  Children and adolescents should be screened for type 2 diabetes if they are overweight and have 2 of the following risk factors: ? A family history of type 2  diabetes. ? Being a member of a high risk race or ethnic group. ? Signs of insulin resistance or conditions associated with insulin resistance. ? A mother who had gestational diabetes while pregnant with him or her.  Screening should be done at least once every three years, starting at age 79. Your health care provider or your child's health care provider may recommend having a screening more or less often. What happens during screening? During screening, your health care provider may ask questions about:  Your health and your risk factors, including your activity level and any medical conditions that you have.  The health of your first-degree relatives.  Past pregnancies, if this applies.  Your health care provider will also do a physical exam, including a blood pressure measurement and blood tests. There are four blood tests that can be  used to screen for type 2 diabetes. You may have one or more of the following:  A fasting plasma glucose test (FBG). You will not be allowed to eat for at least eight hours before a blood sample is taken.  A random blood glucose test. This test checks your blood glucose at any time of the day regardless of when you ate.  An oral glucose tolerance test (OGTT). This test measures your blood glucose at two times: ? After you have not eaten (have fasted) overnight. ? Two hours after you drink a glucose-containing beverage. A diagnosis can be made if the level is greater than 200 mg/dL.  An A1c test. This test provides information about blood glucose control over the previous three months.  What do the results mean? Your test results are a measurement of how much glucose is in your blood. Normal blood glucose levels mean that you do not have diabetes or prediabetes. High blood glucose levels may mean that you have prediabetes or diabetes. Depending on the results, other tests may be needed to confirm the diagnosis. This information is not intended to  replace advice given to you by your health care provider. Make sure you discuss any questions you have with your health care provider. Document Released: 10/29/2008 Document Revised: 06/10/2015 Document Reviewed: 10/30/2014 Elsevier Interactive Patient Education  2017 [place] Prevention in the Home Falls can cause injuries. They can happen to people of all ages. There are many things you can do to make your home safe and to help prevent falls. What can I do on the outside of my home?  Regularly fix the edges of walkways and driveways and fix any cracks.  Remove anything that might make you trip as you walk through a door, such as a raised step or threshold.  Trim any bushes or trees on the path to your home.  Use bright outdoor lighting.  Clear any walking paths of anything that might make someone trip, such as rocks or tools.  Regularly check to see if handrails are loose or broken. Make sure that both sides of any steps have handrails.  Any raised decks and porches should have guardrails on the edges.  Have any leaves, snow, or ice cleared regularly.  Use sand or salt on walking paths during winter.  Clean up any spills in your garage right away. This includes oil or grease spills. What can I do in the bathroom?  Use night lights.  Install grab bars by the toilet and in the tub and shower. Do not use towel bars as grab bars.  Use non-skid mats or decals in the tub or shower.  If you need to sit down in the shower, use a plastic, non-slip stool.  Keep the floor dry. Clean up any water that spills on the floor as soon as it happens.  Remove soap buildup in the tub or shower regularly.  Attach bath mats securely with double-sided non-slip rug tape.  Do not have throw rugs and other things on the floor that can make you trip. What can I do in the bedroom?  Use night lights.  Make sure that you have a light by your bed that is easy to reach.  Do not use  any sheets or blankets that are too big for your bed. They should not hang down onto the floor.  Have a firm chair that has side arms. You can use this for support while you get dressed.  Do not have throw rugs and other things on the floor that can make you trip. What can I do in the kitchen?  Clean up any spills right away.  Avoid walking on wet floors.  Keep items that you use a lot in easy-to-reach places.  If you need to reach something above you, use a strong step stool that has a grab bar.  Keep electrical cords out of the way.  Do not use floor polish or wax that makes floors slippery. If you must use wax, use non-skid floor wax.  Do not have throw rugs and other things on the floor that can make you trip. What can I do with my stairs?  Do not leave any items on the stairs.  Make sure that there are handrails on both sides of the stairs and use them. Fix handrails that are broken or loose. Make sure that handrails are as long as the stairways.  Check any carpeting to make sure that it is firmly attached to the stairs. Fix any carpet that is loose or worn.  Avoid having throw rugs at the top or bottom of the stairs. If you do have throw rugs, attach them to the floor with carpet tape.  Make sure that you have a light switch at the top of the stairs and the bottom of the stairs. If you do not have them, ask someone to add them for you. What else can I do to help prevent falls?  Wear shoes that: ? Do not have high heels. ? Have rubber bottoms. ? Are comfortable and fit you well. ? Are closed at the toe. Do not wear sandals.  If you use a stepladder: ? Make sure that it is fully opened. Do not climb a closed stepladder. ? Make sure that both sides of the stepladder are locked into place. ? Ask someone to hold it for you, if possible.  Clearly mark and make sure that you can see: ? Any grab bars or handrails. ? First and last steps. ? Where the edge of each step  is.  Use tools that help you move around (mobility aids) if they are needed. These include: ? Canes. ? Walkers. ? Scooters. ? Crutches.  Turn on the lights when you go into a dark area. Replace any light bulbs as soon as they burn out.  Set up your furniture so you have a clear path. Avoid moving your furniture around.  If any of your floors are uneven, fix them.  If there are any pets around you, be aware of where they are.  Review your medicines with your doctor. Some medicines can make you feel dizzy. This can increase your chance of falling. Ask your doctor what other things that you can do to help prevent falls. This information is not intended to replace advice given to you by your health care provider. Make sure you discuss any questions you have with your health care provider. Document Released: 10/29/2008 Document Revised: 06/10/2015 Document Reviewed: 02/06/2014 Elsevier Interactive Patient Education  2018 [place] Maintenance, Female Adopting a healthy lifestyle and getting preventive care can go a long way to promote health and wellness. Talk with your health care provider about what schedule of regular examinations is right for you. This is a good chance for you to check in with your provider about disease prevention and staying healthy. In between checkups, there are plenty of things you can do on your own. Experts have done a  lot of research about which lifestyle changes and preventive measures are most likely to keep you healthy. Ask your health care provider for more information. Weight and diet Eat a healthy diet  Be sure to include plenty of vegetables, fruits, low-fat dairy products, and lean protein.  Do not eat a lot of foods high in solid fats, added sugars, or salt.  Get regular exercise. This is one of the most important things you can do for your health. ? Most adults should exercise for at least 150 minutes each week. The exercise should  increase your heart rate and make you sweat (moderate-intensity exercise). ? Most adults should also do strengthening exercises at least twice a week. This is in addition to the moderate-intensity exercise.  Maintain a healthy weight  Body mass index (BMI) is a measurement that can be used to identify possible weight problems. It estimates body fat based on height and weight. Your health care provider can help determine your BMI and help you achieve or maintain a healthy weight.  For females 58 years of age and older: ? A BMI below 18.5 is considered underweight. ? A BMI of 18.5 to 24.9 is normal. ? A BMI of 25 to 29.9 is considered overweight. ? A BMI of 30 and above is considered obese.  Watch levels of cholesterol and blood lipids  You should start having your blood tested for lipids and cholesterol at 70 years of age, then have this test every 5 years.  You may need to have your cholesterol levels checked more often if: ? Your lipid or cholesterol levels are high. ? You are older than 70 years of age. ? You are at high risk for heart disease.  Cancer screening Lung Cancer  Lung cancer screening is recommended for adults 18-68 years old who are at high risk for lung cancer because of a history of smoking.  A yearly low-dose CT scan of the lungs is recommended for people who: ? Currently smoke. ? Have quit within the past 15 years. ? Have at least a 30-pack-year history of smoking. A pack year is smoking an average of one pack of cigarettes a day for 1 year.  Yearly screening should continue until it has been 15 years since you quit.  Yearly screening should stop if you develop a health problem that would prevent you from having lung cancer treatment.  Breast Cancer  Practice breast self-awareness. This means understanding how your breasts normally appear and feel.  It also means doing regular breast self-exams. Let your health care provider know about any changes, no matter  how small.  If you are in your 20s or 30s, you should have a clinical breast exam (CBE) by a health care provider every 1-3 years as part of a regular health exam.  If you are 58 or older, have a CBE every year. Also consider having a breast X-ray (mammogram) every year.  If you have a family history of breast cancer, talk to your health care provider about genetic screening.  If you are at high risk for breast cancer, talk to your health care provider about having an MRI and a mammogram every year.  Breast cancer gene (BRCA) assessment is recommended for women who have family members with BRCA-related cancers. BRCA-related cancers include: ? Breast. ? Ovarian. ? Tubal. ? Peritoneal cancers.  Results of the assessment will determine the need for genetic counseling and BRCA1 and BRCA2 testing.  Cervical Cancer Your health care provider may recommend  that you be screened regularly for cancer of the pelvic organs (ovaries, uterus, and vagina). This screening involves a pelvic examination, including checking for microscopic changes to the surface of your cervix (Pap test). You may be encouraged to have this screening done every 3 years, beginning at age 28.  For women ages 58-65, health care providers may recommend pelvic exams and Pap testing every 3 years, or they may recommend the Pap and pelvic exam, combined with testing for human papilloma virus (HPV), every 5 years. Some types of HPV increase your risk of cervical cancer. Testing for HPV may also be done on women of any age with unclear Pap test results.  Other health care providers may not recommend any screening for nonpregnant women who are considered low risk for pelvic cancer and who do not have symptoms. Ask your health care provider if a screening pelvic exam is right for you.  If you have had past treatment for cervical cancer or a condition that could lead to cancer, you need Pap tests and screening for cancer for at least 20  years after your treatment. If Pap tests have been discontinued, your risk factors (such as having a new sexual partner) need to be reassessed to determine if screening should resume. Some women have medical problems that increase the chance of getting cervical cancer. In these cases, your health care provider may recommend more frequent screening and Pap tests.  Colorectal Cancer  This type of cancer can be detected and often prevented.  Routine colorectal cancer screening usually begins at 70 years of age and continues through 70 years of age.  Your health care provider may recommend screening at an earlier age if you have risk factors for colon cancer.  Your health care provider may also recommend using home test kits to check for hidden blood in the stool.  A small camera at the end of a tube can be used to examine your colon directly (sigmoidoscopy or colonoscopy). This is done to check for the earliest forms of colorectal cancer.  Routine screening usually begins at age 10.  Direct examination of the colon should be repeated every 5-10 years through 70 years of age. However, you may need to be screened more often if early forms of precancerous polyps or small growths are found.  Skin Cancer  Check your skin from head to toe regularly.  Tell your health care provider about any new moles or changes in moles, especially if there is a change in a mole's shape or color.  Also tell your health care provider if you have a mole that is larger than the size of a pencil eraser.  Always use sunscreen. Apply sunscreen liberally and repeatedly throughout the day.  Protect yourself by wearing long sleeves, pants, a wide-brimmed hat, and sunglasses whenever you are outside.  Heart disease, diabetes, and high blood pressure  High blood pressure causes heart disease and increases the risk of stroke. High blood pressure is more likely to develop in: ? People who have blood pressure in the high  end of the normal range (130-139/85-89 mm Hg). ? People who are overweight or obese. ? People who are African American.  If you are 52-27 years of age, have your blood pressure checked every 3-5 years. If you are 61 years of age or older, have your blood pressure checked every year. You should have your blood pressure measured twice-once when you are at a hospital or clinic, and once when you are not  at a hospital or clinic. Record the average of the two measurements. To check your blood pressure when you are not at a hospital or clinic, you can use: ? An automated blood pressure machine at a pharmacy. ? A home blood pressure monitor.  If you are between 54 years and 24 years old, ask your health care provider if you should take aspirin to prevent strokes.  Have regular diabetes screenings. This involves taking a blood sample to check your fasting blood sugar level. ? If you are at a normal weight and have a low risk for diabetes, have this test once every three years after 70 years of age. ? If you are overweight and have a high risk for diabetes, consider being tested at a younger age or more often. Preventing infection Hepatitis B  If you have a higher risk for hepatitis B, you should be screened for this virus. You are considered at high risk for hepatitis B if: ? You were born in a country where hepatitis B is common. Ask your health care provider which countries are considered high risk. ? Your parents were born in a high-risk country, and you have not been immunized against hepatitis B (hepatitis B vaccine). ? You have HIV or AIDS. ? You use needles to inject street drugs. ? You live with someone who has hepatitis B. ? You have had sex with someone who has hepatitis B. ? You get hemodialysis treatment. ? You take certain medicines for conditions, including cancer, organ transplantation, and autoimmune conditions.  Hepatitis C  Blood testing is recommended for: ? Everyone born from  [birth year] through 1964. ? Anyone with known risk factors for hepatitis C.  Sexually transmitted infections (STIs)  You should be screened for sexually transmitted infections (STIs) including gonorrhea and chlamydia if: ? You are sexually active and are younger than 70 years of age. ? You are older than 70 years of age and your health care provider tells you that you are at risk for this type of infection. ? Your sexual activity has changed since you were last screened and you are at an increased risk for chlamydia or gonorrhea. Ask your health care provider if you are at risk.  If you do not have HIV, but are at risk, it may be recommended that you take a prescription medicine daily to prevent HIV infection. This is called pre-exposure prophylaxis (PrEP). You are considered at risk if: ? You are sexually active and do not regularly use condoms or know the HIV status of your partner(s). ? You take drugs by injection. ? You are sexually active with a partner who has HIV.  Talk with your health care provider about whether you are at high risk of being infected with HIV. If you choose to begin PrEP, you should first be tested for HIV. You should then be tested every 3 months for as long as you are taking PrEP. Pregnancy  If you are premenopausal and you may become pregnant, ask your health care provider about preconception counseling.  If you may become pregnant, take 400 to 800 micrograms (mcg) of folic acid every day.  If you want to prevent pregnancy, talk to your health care provider about birth control (contraception). Osteoporosis and menopause  Osteoporosis is a disease in which the bones lose minerals and strength with aging. This can result in serious bone fractures. Your risk for osteoporosis can be identified using a bone density scan.  If you are 67 years of age  or older, or if you are at risk for osteoporosis and fractures, ask your health care provider if you should be  screened.  Ask your health care provider whether you should take a calcium or vitamin D supplement to lower your risk for osteoporosis.  Menopause may have certain physical symptoms and risks.  Hormone replacement therapy may reduce some of these symptoms and risks. Talk to your health care provider about whether hormone replacement therapy is right for you. Follow these instructions at home:  Schedule regular health, dental, and eye exams.  Stay current with your immunizations.  Do not use any tobacco products including cigarettes, chewing tobacco, or electronic cigarettes.  If you are pregnant, do not drink alcohol.  If you are breastfeeding, limit how much and how often you drink alcohol.  Limit alcohol intake to no more than 1 drink per day for nonpregnant women. One drink equals 12 ounces of beer, 5 ounces of wine, or 1 ounces of hard liquor.  Do not use street drugs.  Do not share needles.  Ask your health care provider for help if you need support or information about quitting drugs.  Tell your health care provider if you often feel depressed.  Tell your health care provider if you have ever been abused or do not feel safe at home. This information is not intended to replace advice given to you by your health care provider. Make sure you discuss any questions you have with your health care provider. Document Released: 07/18/2010 Document Revised: 06/10/2015 Document Reviewed: 10/06/2014 Elsevier Interactive Patient Education  [employer].

## 2016-10-23 DIAGNOSIS — E119 Type 2 diabetes mellitus without complications: Secondary | ICD-10-CM | POA: Diagnosis not present

## 2016-10-23 DIAGNOSIS — H25013 Cortical age-related cataract, bilateral: Secondary | ICD-10-CM | POA: Diagnosis not present

## 2016-10-23 DIAGNOSIS — H35033 Hypertensive retinopathy, bilateral: Secondary | ICD-10-CM | POA: Diagnosis not present

## 2016-10-23 DIAGNOSIS — H40013 Open angle with borderline findings, low risk, bilateral: Secondary | ICD-10-CM | POA: Diagnosis not present

## 2016-10-23 LAB — HM DIABETES EYE EXAM

## 2016-10-27 NOTE — Progress Notes (Signed)
Chief Complaint  Patient presents with  . Follow-up    Lexapro    HPI: Caitlin [name] 71 y.o. come in for Chronic disease med  management   Had failed nissan and now hernia with sx  ibs and  Enteritis     Given bentyl with some success for abd cramping using 1-2 per day   Last awv august 17  ? suggested hearing eval  Had one 2013 only mild loss has family hx of hearin gl oss   Sees aquino eval for memory issues partly from anxiety   On lexapro and seems to be ok  Less irritable and husband  Probably would like her to stay on it.   ? about shingles vaccine  Needs refill potassium  lexapro  bg seems to be good was 89 tiday ROS: See pertinent positives and negatives per HPI.  Past Medical History:  Diagnosis Date  . Allergy   . Anxiety   . Arthritis   . Blood transfusion without reported diagnosis   . Cataract    bil eyes  . Depression   . Diabetes mellitus without complication (HCC)    pre-diabetic, no meds, diet controlled  . Diverticulosis of colon   . Enteritis   . Erythema nodosum    Tends to be seasonal has had a negative chest x-ray in the past negative PPD  . Fibromyalgia   . GERD (gastroesophageal reflux disease)    History of stricture and dilatation and Nissen surgery  . Hypertension   . IBS (irritable bowel syndrome)   . Meniere's disease    ? dx per dr Marcelline Mates  . Mood disorder ([place])   . Neuromuscular disorder ([place])    fibromylagia  . Osteopenia   . Sleep apnea    [facility].  pt does not wear a c-pap.    Family History  Problem Relation Age of Onset  . Depression Mother   . Kidney cancer Mother   . Depression Father   . Hiatal hernia Father   . COPD Sister        Deceased 2010/01/05  . Nephrolithiasis Unknown   . Dementia Brother   . Colon cancer Neg Hx   . Esophageal cancer Neg Hx   . Pancreatic cancer Neg Hx   . Rectal cancer Neg Hx   . Stomach cancer Neg Hx     Social History   Social History  . Marital status: Married      Spouse name: N/A  . Number of children: 1  . Years of education: N/A   Occupational History  . Retired    Social History Main Topics  . Smoking status: Never Smoker  . Smokeless tobacco: Never Used  . Alcohol use No  . Drug use: No  . Sexual activity: Not Asked   Other Topics Concern  . None   Social History Narrative   Unemployed   Married   [place] of 2    Has grandchildren out of state   No pets   Sis. died January 05, 2010 COPD    Outpatient Medications Prior to Visit  Medication Sig Dispense Refill  . calcium carbonate (TUMS EX) 750 MG chewable tablet Chew 1 tablet by mouth as needed for heartburn.    . cholecalciferol (VITAMIN D) 1000 UNITS tablet Take 2,000 Units by mouth daily.     [name] dicyclomine (BENTYL) 10 MG capsule Take 1 capsule (10 mg total) by mouth every 8 (eight) hours as needed for spasms.  90 capsule 11  . esomeprazole (NEXIUM) 20 MG capsule Take 20 mg by mouth daily at 12 noon. Reported on 03/08/2015    . FREESTYLE LITE test strip USE TO CHECK BLOOD SUGAR DAILY AND AS NEEDED 100 each 4  . Lancets (FREESTYLE) lancets USE TO CHECK BLOOD SUGAR DAILY AND AS NEEDED 100 each 4  . Probiotic Product (VSL#3) CAPS #112 B units daily    . triamterene-hydrochlorothiazide (MAXZIDE-25) 37.5-25 MG tablet TAKE 1/2 (ONE-HALF) TABLET BY MOUTH ONCE DAILY 45 tablet 1  . escitalopram (LEXAPRO) 10 MG tablet TAKE 1 TABLET BY MOUTH ONCE DAILY 30 tablet 0  . KLOR-CON M10 10 MEQ tablet TAKE 1 TABLET BY MOUTH EVERY OTHER DAY WITH FLUID TABLET 45 tablet 0   No facility-administered medications prior to visit.      EXAM:  BP 124/82 (BP Location: Left Arm, Patient Position: Sitting, Cuff Size: Normal)   Pulse 65   Temp 97.7 F (36.5 C) (Oral)   Wt 138 lb 12.8 oz (63 kg)   BMI 27.57 kg/m   Body mass index is 27.57 kg/m.  GENERAL: vitals reviewed and listed above, alert, oriented, appears well hydrated and in no acute distress HEENT: atraumatic, conjunctiva  clear, no obvious  abnormalities on inspection of external nose and ears   NECK: no obvious masses on inspection palpation  LUNGS: clear to auscultation bilaterally, no wheezes, rales or rhonchi, good air movement CV: HRRR, no clubbing cyanosis or  peripheral edema nl cap refill  MS: moves all extremities without noticeable focal  abnormality PSYCH: pleasant and cooperative, no obvious depression or anxiety more animated affect  Lab Results  Component Value Date   WBC 9.3 05/01/2016   HGB 14.2 05/01/2016   HCT 42.9 05/01/2016   PLT 365.0 05/01/2016   GLUCOSE 86 05/01/2016   CHOL 166 07/10/2014   TRIG 101.0 07/10/2014   HDL 82.20 07/10/2014   LDLCALC 64 07/10/2014   ALT 15 04/28/2016   AST 24 04/28/2016   NA 141 05/01/2016   K 3.7 05/01/2016   CL 102 05/01/2016   CREATININE 0.93 05/01/2016   BUN 10 05/01/2016   CO2 29 05/01/2016   TSH 2.39 08/30/2015   HGBA1C 5.8 08/30/2015   MICROALBUR 1.6 04/26/2011   BP Readings from Last 3 Encounters:  10/30/16 124/82  10/19/16 116/60  10/04/16 133/67   Wt Readings from Last 3 Encounters:  10/30/16 138 lb 12.8 oz (63 kg)  10/19/16 139 lb 6 oz (63.2 kg)  10/04/16 139 lb (63 kg)   Wt Readings from Last 3 Encounters:  10/30/16 138 lb 12.8 oz (63 kg)  10/19/16 139 lb 6 oz (63.2 kg)  10/04/16 139 lb (63 kg)     ASSESSMENT AND PLAN:  Discussed the following assessment and plan:  Adjustment reaction with anxiety and depression  Medication management  Gastroesophageal reflux disease, esophagitis presence not specified - with HH  sp nissen   Decreased hearing, unspecified laterality  Memory difficulties Get hearing reevaluated .  shingles vaccine   Advised.   Will refill the lexapro and  Potassium  Due for blood tests  about 6 months   Make appt  For yearly check and lab at the visit in  March April .  -Patient advised to return or notify health care team  if  new concerns arise. Total visit 8mins > 50% spent counseling and coordinating care  as indicated in above note and in instructions to patient .    Patient Instructions  Advise  Getting hearing reevaluated .   Refill meds today   Plan  Yearly visit in  March April.  with labs  at thte visit  Will refill your medicaitons.    Standley Brooking. Jafari Mckillop M.D.

## 2016-10-30 ENCOUNTER — Other Ambulatory Visit: Payer: Self-pay | Admitting: Internal Medicine

## 2016-10-30 ENCOUNTER — Encounter: Payer: Self-pay | Admitting: Internal Medicine

## 2016-10-30 ENCOUNTER — Ambulatory Visit (INDEPENDENT_AMBULATORY_CARE_PROVIDER_SITE_OTHER): Payer: Medicare Other | Admitting: Internal Medicine

## 2016-10-30 VITALS — BP 124/82 | HR 65 | Temp 97.7°F | Wt 138.8 lb

## 2016-10-30 DIAGNOSIS — R413 Other amnesia: Secondary | ICD-10-CM

## 2016-10-30 DIAGNOSIS — Z79899 Other long term (current) drug therapy: Secondary | ICD-10-CM

## 2016-10-30 DIAGNOSIS — K219 Gastro-esophageal reflux disease without esophagitis: Secondary | ICD-10-CM | POA: Diagnosis not present

## 2016-10-30 DIAGNOSIS — F4323 Adjustment disorder with mixed anxiety and depressed mood: Secondary | ICD-10-CM

## 2016-10-30 DIAGNOSIS — H9190 Unspecified hearing loss, unspecified ear: Secondary | ICD-10-CM | POA: Diagnosis not present

## 2016-10-30 MED ORDER — ZOSTER VAC RECOMB ADJUVANTED 50 MCG/0.5ML IM SUSR: 0.5000 mL | Freq: Once | INTRAMUSCULAR | 1 refills | Status: AC

## 2016-10-30 MED ORDER — ESCITALOPRAM OXALATE 10 MG PO TABS: 10.0000 mg | ORAL_TABLET | Freq: Every day | ORAL | 1 refills | Status: DC

## 2016-10-30 MED ORDER — POTASSIUM CHLORIDE CRYS ER 10 MEQ PO TBCR: EXTENDED_RELEASE_TABLET | ORAL | 1 refills | Status: DC

## 2016-10-30 NOTE — Patient Instructions (Signed)
Advise  Getting hearing reevaluated .   Refill meds today   Plan  Yearly visit in  March April.  with labs  at thte visit  Will refill your medicaitons.

## 2017-03-12 ENCOUNTER — Other Ambulatory Visit: Payer: Self-pay

## 2017-03-12 ENCOUNTER — Ambulatory Visit (INDEPENDENT_AMBULATORY_CARE_PROVIDER_SITE_OTHER): Payer: Medicare Other | Admitting: Neurology

## 2017-03-12 ENCOUNTER — Encounter: Payer: Self-pay | Admitting: Neurology

## 2017-03-12 VITALS — BP 110/64 | HR 70 | Ht 59.0 in | Wt 138.0 lb

## 2017-03-12 DIAGNOSIS — R413 Other amnesia: Secondary | ICD-10-CM

## 2017-03-12 NOTE — Progress Notes (Signed)
NEUROLOGY FOLLOW UP OFFICE NOTE  Caitlin [name] [id number]  DOB: 1945-12-03  HISTORY OF PRESENT ILLNESS: I had the pleasure of seeing Caitlin [name] in follow-up in the neurology clinic on 03/12/2017.  The patient was last seen a year ago for worsening memory. MMSE in February 2018 was again normal 29/30 (30/30 in February 2017). Since her last visit, she feels memory is about the same, it takes her longer to remember things. She denies getting lost driving. She occasionally forgets her medications. Her husband is in charge of finances. She continues to deal with anxiety, and feels anxiety would probably be worse if she was not taking Lexapro. She is concerned that Lexapro and dicyclomine are making her more drowsy. She takes the Lexapro at night. She continues to deal with stomach issues and has been seeing GI. She denies any headaches, dizziness, diplopia, focal numbness/tingling/weakness, no falls.   HPI 09/01/14: This is a 72 yo RH woman with a history of hypertension, diet-controlled hyperlipidemia, IBS, GERD, chronic neck and back pain, depression and anxiety, who presented for worsening memory. She reports that memory changes started several years ago, she would be unable to think of a name or word she wanted to say. She has had to write everything down otherwise she would forget appointments. She recalls the first time this happened was 12 years ago, she got a reminder call about a dentist appointment 2 days prior but still missed it. She is has been stressed and worried because 2 of her husband's family members have recently passed away with dementia. She continues to drive without getting lost. She denies missing her regular medications but occasionally forgets her vitamins. She has to stay by the stove when cooking, one time she walked away from soup she was making and forgot about it. Her husband is in charge of bill payments. She has always had problems multi-tasking, but has noticed this has  been worse the past couple of years, she easily loses her train of thought when distracted. Her maternal grandmother and 2 brothers were diagnosed with dementia. Her mother and sister had memory problems before they passed away from other medical conditions. She denies any significant head injuries, no alcohol use.   Diagnostic Data: MRI brain in 2012 was personally reviewed today, unremarkable with mild diffuse atrophy. Similar to MRI brain done 08/2014 as above.  PAST MEDICAL HISTORY: Past Medical History:  Diagnosis Date  . Allergy   . Anxiety   . Arthritis   . Blood transfusion without reported diagnosis   . Cataract    bil eyes  . Depression   . Diabetes mellitus without complication (HCC)    pre-diabetic, no meds, diet controlled  . Diverticulosis of colon   . Enteritis   . Erythema nodosum    Tends to be seasonal has had a negative chest x-ray in the past negative PPD  . Fibromyalgia   . GERD (gastroesophageal reflux disease)    History of stricture and dilatation and Nissen surgery  . Hypertension   . IBS (irritable bowel syndrome)   . Meniere's disease    ? dx per dr Marcelline Mates  . Mood disorder ([place])   . Neuromuscular disorder ([place])    fibromylagia  . Osteopenia   . Sleep apnea    [facility].  pt does not wear a c-pap.    MEDICATIONS: Current Outpatient Medications on File Prior to Visit  Medication Sig Dispense Refill  . calcium carbonate (TUMS EX) 750 MG chewable tablet  Chew 1 tablet by mouth as needed for heartburn.    . cholecalciferol (VITAMIN D) 1000 UNITS tablet Take 2,000 Units by mouth daily.     [name] dicyclomine (BENTYL) 10 MG capsule Take 1 capsule (10 mg total) by mouth every 8 (eight) hours as needed for spasms. 90 capsule 11  . escitalopram (LEXAPRO) 10 MG tablet Take 1 tablet (10 mg total) by mouth daily. 90 tablet 1  . esomeprazole (NEXIUM) 20 MG capsule Take 20 mg by mouth daily at 12 noon. Reported on 03/08/2015    . FREESTYLE LITE test strip USE  TO CHECK BLOOD SUGAR DAILY AND AS NEEDED 100 each 4  . Lancets (FREESTYLE) lancets USE TO CHECK BLOOD SUGAR DAILY AND AS NEEDED 100 each 4  . potassium chloride (KLOR-CON M10) 10 MEQ tablet TAKE 1 TABLET BY MOUTH EVERY OTHER DAY WITH FLUID TABLET 45 tablet 1  . Probiotic Product (VSL#3) CAPS #112 B units daily    . triamterene-hydrochlorothiazide (MAXZIDE-25) 37.5-25 MG tablet TAKE 1/2 (ONE-HALF) TABLET BY MOUTH ONCE DAILY 45 tablet 1   No current facility-administered medications on file prior to visit.     ALLERGIES: Allergies  Allergen Reactions  . Mobic [Meloxicam] Diarrhea  . Aleve [Naproxen]     Upset stomach if takes more than once a day  . Codeine Phosphate Other (See Comments)    Hallucinations  . Omeprazole-Sodium Bicarbonate     Sever diarrhea, stomach cramps  . Protonix [Pantoprazole Sodium] Diarrhea    Leg nodules  . Sulfamethoxazole Diarrhea and Nausea And Vomiting    FAMILY HISTORY: Family History  Problem Relation Age of Onset  . Depression Mother   . Kidney cancer Mother   . Depression Father   . Hiatal hernia Father   . COPD Sister        Deceased Jan 13, 2010  . Nephrolithiasis Unknown   . Dementia Brother   . Colon cancer Neg Hx   . Esophageal cancer Neg Hx   . Pancreatic cancer Neg Hx   . Rectal cancer Neg Hx   . Stomach cancer Neg Hx     SOCIAL HISTORY: Social History   Socioeconomic History  . Marital status: Married    Spouse name: Not on file  . Number of children: 1  . Years of education: Not on file  . Highest education level: Not on file  Social Needs  . Financial resource strain: Not on file  . Food insecurity - worry: Not on file  . Food insecurity - inability: Not on file  . Transportation needs - medical: Not on file  . Transportation needs - non-medical: Not on file  Occupational History  . Occupation: Retired  Tobacco Use  . Smoking status: Never Smoker  . Smokeless tobacco: Never Used  Substance and Sexual Activity  .  Alcohol use: No    Alcohol/week: 0.0 oz  . Drug use: No  . Sexual activity: Not on file  Other Topics Concern  . Not on file  Social History Narrative   Unemployed   Married   [employer] of 2    Has grandchildren out of state   No pets   Sis. died 13-Jan-2010 COPD    REVIEW OF SYSTEMS: Constitutional: No fevers, chills, or sweats, no generalized fatigue, change in appetite Eyes: No visual changes, double vision, eye pain Ear, nose and throat: No hearing loss, ear pain, nasal congestion, sore throat Cardiovascular: No chest pain, palpitations Respiratory:  No shortness of breath at rest  or with exertion, wheezes GastrointestinaI: No nausea, vomiting, +diarrhea,no abdominal pain, fecal incontinence Genitourinary:  No dysuria, urinary retention or frequency Musculoskeletal:  + neck pain, back pain Integumentary: No rash, pruritus, skin lesions Neurological: as above Psychiatric: No depression, insomnia, anxiety Endocrine: No palpitations, fatigue, diaphoresis, mood swings, change in appetite, change in weight, increased thirst Hematologic/Lymphatic:  No anemia, purpura, petechiae. Allergic/Immunologic: no itchy/runny eyes, nasal congestion, recent allergic reactions, rashes  PHYSICAL EXAM: Vitals:   03/12/17 1528  BP: 110/64  Pulse: 70  SpO2: 98%   General: No acute distress Head:  Normocephalic/atraumatic Neck: supple, no paraspinal tenderness, full range of motion Heart:  Regular rate and rhythm Lungs:  Clear to auscultation bilaterally Back: No paraspinal tenderness Skin/Extremities: No rash, no edema Neurological Exam: alert and oriented to person, place, and time. No aphasia or dysarthria. Fund of knowledge is appropriate.  Recent and remote memory are intact.  Attention and concentration are normal.    Able to name objects and repeat phrases. CDT 5/5. MMSE - Mini Mental State Exam 03/12/2017 10/19/2016 03/10/2016  Not completed: - (No Data) -  Orientation to time 5 - 5    Orientation to Place 5 - 5  Registration 3 - 3  Attention/ Calculation 3 - 4  Recall 3 - 3  Language- name 2 objects 2 - 2  Language- repeat 1 - 1  Language- follow 3 step command 3 - 3  Language- read & follow direction 1 - 1  Write a sentence 1 - 1  Copy design 1 - 1  Total score 28 - 29   Cranial nerves: Pupils equal, round, reactive to light.  Extraocular movements intact with no nystagmus. Visual fields full. Facial sensation intact. No facial asymmetry. Tongue, uvula, palate midline.  Motor: Bulk and tone normal, muscle strength 5/5 throughout with no pronator drift.  Sensation to light touch intact.  No extinction to double simultaneous stimulation.  Deep tendon reflexes 2+ throughout, toes downgoing.  Finger to nose testing intact.  Gait narrow-based and steady, able to tandem walk adequately.  Romberg negative.  IMPRESSION: This is a 72 yo RH woman with a history of hypertension, diet-controlled hyperlipidemia, depression, anxiety, GERD,who presented for worsening memory loss. MMSE today again normal 28/30 (29/30 in February 2018, 30/30 in February 2017 and August 2016). MRI brain unremarkable, TSH and B12 normal. We again discussed effects of anxiety on memory, she will continue working on medications with her PCP. She again remarked on concern about family history of dementia and a sister-in-law with early dementia, patient was again reassured about continued normal exam. No indication to start cholinesterase inhibitors such as Aricept. We again discussed the importance of control of vascular risk factors, physical exercise, and brain stimulation exercises for brain health. She will follow-up in 1 year or earlier if needed.   Thank you for allowing me to participate in her care.  Please do not hesitate to call for any questions or concerns.  The duration of this appointment visit was 25 minutes of face-to-face time with the patient.  Greater than 50% of this time was spent in  counseling, explanation of diagnosis, planning of further management, and coordination of care.   Caitlin [name], M.D.   CC: Dr. Regis Bill

## 2017-03-12 NOTE — Patient Instructions (Signed)
Continue working on anxiety with your PCP. Follow-up in 1 year, call for any changes.  RECOMMENDATIONS FOR ALL PATIENTS WITH MEMORY PROBLEMS: 1. Continue to exercise (Recommend 30 minutes of walking everyday, or 3 hours every week) 2. Increase social interactions - continue going to [place] and enjoy social gatherings with friends and family 3. Eat healthy, avoid fried foods and eat more fruits and vegetables 4. Maintain adequate blood pressure, blood sugar, and blood cholesterol level. Reducing the risk of stroke and cardiovascular disease also helps promoting better memory. 5. Avoid stressful situations. Live a simple life and avoid aggravations. Organize your time and prepare for the next day in anticipation. 6. Sleep well, avoid any interruptions of sleep and avoid any distractions in the bedroom that may interfere with adequate sleep quality 7. Avoid sugar, avoid sweets as there is a strong link between excessive sugar intake, diabetes, and cognitive impairment We discussed the Mediterranean diet, which has been shown to help patients reduce the risk of progressive memory disorders and reduces cardiovascular risk. This includes eating fish, eat fruits and green leafy vegetables, nuts like almonds and hazelnuts, walnuts, and also use olive oil. Avoid fast foods and fried foods as much as possible. Avoid sweets and sugar as sugar use has been linked to worsening of memory function.

## 2017-03-14 ENCOUNTER — Encounter: Payer: Self-pay | Admitting: Neurology

## 2017-04-08 ENCOUNTER — Other Ambulatory Visit: Payer: Self-pay | Admitting: Internal Medicine

## 2017-04-16 NOTE — Progress Notes (Signed)
Chief Complaint  Patient presents with  . Annual Exam    still having digestion prob. feels it may be worsening,     HPI: Caitlin [name] 72 y.o. come in for Chronic disease management   Yearly visit    Memory : Saw dr Delice Lesch 2 19 stable mmse 28/30   fam hx dementia  GI still crampy    diarrhea much and then liquid.    At least 4-5 x . Per day when has and interferes with travel or   Constipation alternating  For 1 day.   Dairy intolerant .   At this time    Butter   .   Gas diarrhea and  Immediate.     / if eggs. Allergic intolerant like daughter   No vomiting weight loss  ROS: See pertinent positives and negatives per HPI. No cv pulm sx bleeding  Past Medical History:  Diagnosis Date  . Allergy   . Anxiety   . Arthritis   . Blood transfusion without reported diagnosis   . Cataract    bil eyes  . Depression   . Diabetes mellitus without complication (HCC)    pre-diabetic, no meds, diet controlled  . Diverticulosis of colon   . Enteritis   . Erythema nodosum    Tends to be seasonal has had a negative chest x-ray in the past negative PPD  . Fibromyalgia   . GERD (gastroesophageal reflux disease)    History of stricture and dilatation and Nissen surgery  . Hypertension   . IBS (irritable bowel syndrome)   . Meniere's disease    ? dx per dr Marcelline Mates  . Mood disorder ([place])   . Neuromuscular disorder ([place])    fibromylagia  . Osteopenia   . Sleep apnea    [facility].  pt does not wear a c-pap.    Family History  Problem Relation Age of Onset  . Depression Mother   . Kidney cancer Mother   . Depression Father   . Hiatal hernia Father   . COPD Sister        Deceased December 15, 2009  . Nephrolithiasis Unknown   . Dementia Brother   . Colon cancer Neg Hx   . Esophageal cancer Neg Hx   . Pancreatic cancer Neg Hx   . Rectal cancer Neg Hx   . Stomach cancer Neg Hx     Social History   Socioeconomic History  . Marital status: Married    Spouse name:  Not on file  . Number of children: 1  . Years of education: Not on file  . Highest education level: Not on file  Occupational History  . Occupation: Retired  Scientific laboratory technician  . Financial resource strain: Not on file  . Food insecurity:    Worry: Not on file    Inability: Not on file  . Transportation needs:    Medical: Not on file    Non-medical: Not on file  Tobacco Use  . Smoking status: Never Smoker  . Smokeless tobacco: Never Used  Substance and Sexual Activity  . Alcohol use: No    Alcohol/week: 0.0 oz  . Drug use: No  . Sexual activity: Not on file  Lifestyle  . Physical activity:    Days per week: Not on file    Minutes per session: Not on file  . Stress: Not on file  Relationships  . Social connections:    Talks on phone: Not on file  Gets together: Not on file    Attends religious service: Not on file    Active member of club or organization: Not on file    Attends meetings of clubs or organizations: Not on file    Relationship status: Not on file  Other Topics Concern  . Not on file  Social History Narrative   Unemployed   Married   [place] of 2    Has grandchildren out of state   No pets   Sis. died 2009/12/13 COPD    Outpatient Medications Prior to Visit  Medication Sig Dispense Refill  . calcium carbonate (TUMS EX) 750 MG chewable tablet Chew 1 tablet by mouth as needed for heartburn.    . cholecalciferol (VITAMIN D) 1000 UNITS tablet Take 2,000 Units by mouth daily.     [name] dicyclomine (BENTYL) 10 MG capsule Take 1 capsule (10 mg total) by mouth every 8 (eight) hours as needed for spasms. 90 capsule 11  . escitalopram (LEXAPRO) 10 MG tablet Take 1 tablet (10 mg total) by mouth daily. 90 tablet 1  . esomeprazole (NEXIUM) 20 MG capsule Take 20 mg by mouth daily at 12 noon. Reported on 03/08/2015    . FREESTYLE LITE test strip USE TO CHECK BLOOD SUGAR DAILY AND AS NEEDED 100 each 4  . Lancets (FREESTYLE) lancets USE TO CHECK BLOOD SUGAR DAILY AND AS NEEDED  100 each 4  . potassium chloride (KLOR-CON M10) 10 MEQ tablet TAKE 1 TABLET BY MOUTH EVERY OTHER DAY WITH FLUID TABLET 45 tablet 1  . triamterene-hydrochlorothiazide (MAXZIDE-25) 37.5-25 MG tablet TAKE 1/2 (ONE-HALF) TABLET BY MOUTH ONCE DAILY 45 tablet 0  . Probiotic Product (VSL#3) CAPS #112 B units daily (Patient not taking: Reported on 04/17/2017)     No facility-administered medications prior to visit.      EXAM:  BP 136/76 (BP Location: Left Arm, Patient Position: Sitting, Cuff Size: Normal)   Pulse 70   Temp 98 F (36.7 C) (Oral)   Ht _0  (1.499 m)   Wt 138 lb 14.4 oz (63 kg)   BMI 28.05 kg/m   Body mass index is 28.05 kg/m. Physical Exam: Vital signs reviewed BPZ:WCHE is a well-developed well-nourished alert cooperative  female who appears her stated age in no acute distress.  HEENT: normocephalic atraumatic , Eyes: PERRL EOM's full, conjunctiva clear, Nares: paten,t no deformity discharge or tenderness., Ears: no deformity EAC's clear TMs with normal landmarks. Mouth: clear OP, no lesions, edema.  Moist mucous membranes. Dentition in adequate repair. NECK: supple without masses, thyromegaly or bruits. CHEST/PULM:  Clear to auscultation and percussion breath sounds equal no wheeze , rales or rhonchi. No chest wall deformities or tenderness. CV: PMI is nondisplaced, S1 S2 no gallops, murmurs, rubs. Peripheral pulses are full without delay.No JVD . Breast: normal by inspection . No dimpling, discharge, masses, tenderness or discharge . ABDOMEN: Bowel sounds normal nontender  No guard or rebound, no hepato splenomegal no CVA tenderness.   Extremtities:  No clubbing cyanosis or edema, no acute joint swelling or redness no focal atrophy NEURO:  Oriented x3, cranial nerves 3-12 appear to be intact, no obvious focal weakness,gait within normal limits no abnormal reflexes or asymmetrical SKIN: No acute rashes normal turgor, color, no bruising or petechiae. PSYCH: Oriented, good eye  contact, affect somewhat flat but  Nl  cognition and judgment appear normal. LN: no cervical axillary  adenopathy    Lab Results  Component Value Date   WBC 7.0 04/17/2017  HGB 14.1 04/17/2017   HCT 42.4 04/17/2017   PLT 301.0 04/17/2017   GLUCOSE 78 04/17/2017   CHOL 153 04/17/2017   TRIG 92.0 04/17/2017   HDL 79.60 04/17/2017   LDLCALC 55 04/17/2017   ALT 12 04/17/2017   AST 14 04/17/2017   NA 137 04/17/2017   K 4.1 04/17/2017   CL 99 04/17/2017   CREATININE 0.85 04/17/2017   BUN 14 04/17/2017   CO2 31 04/17/2017   TSH 1.49 04/17/2017   HGBA1C 5.6 04/17/2017   MICROALBUR 1.6 04/26/2011   BP Readings from Last 3 Encounters:  04/17/17 136/76  03/12/17 110/64  10/30/16 124/82   Wt Readings from Last 3 Encounters:  04/17/17 138 lb 14.4 oz (63 kg)  03/12/17 138 lb (62.6 kg)  10/30/16 138 lb 12.8 oz (63 kg)     ASSESSMENT AND PLAN:  Discussed the following assessment and plan:  Gastroesophageal reflux disease, esophagitis presence not specified - Plan: CBC with Differential/Platelet, Basic metabolic panel, Hemoglobin A1c, Hepatic function panel, Lipid panel, TSH  Medication management - Plan: CBC with Differential/Platelet, Basic metabolic panel, Hemoglobin A1c, Hepatic function panel, Lipid panel, TSH  Fasting hyperglycemia - Plan: CBC with Differential/Platelet, Basic metabolic panel, Hemoglobin A1c, Hepatic function panel, Lipid panel, TSH  Memory difficulties - Plan: CBC with Differential/Platelet, Basic metabolic panel, Hemoglobin A1c, Hepatic function panel, Lipid panel, TSH  Anxiety state - Plan: CBC with Differential/Platelet, Basic metabolic panel, Hemoglobin A1c, Hepatic function panel, Lipid panel, TSH  HYPERGLYCEMIA - Plan: CBC with Differential/Platelet, Basic metabolic panel, Hemoglobin A1c, Hepatic function panel, Lipid panel, TSH  Essential hypertension - Plan: CBC with Differential/Platelet, Basic metabolic panel, Hemoglobin A1c, Hepatic function  panel, Lipid panel, TSH  Irritable bowel syndrome with diarrhea - Plan: CBC with Differential/Platelet, Basic metabolic panel, Hemoglobin A1c, Hepatic function panel, Lipid panel, TSH Attention to cv risk factors   ls to prevent memory decline   gi sx interfere ing with  Life  Quality  Will ask dr Pilar Grammes if other options would be worth trying  At thsi time continue same med  For depression anxiety  Uncertain how robust the  response but better than before began medication -Patient advised to return or notify health care team  if  new concerns arise.  Patient Instructions   Will notify you  of labs when available.  Will send   Flag to dr N for any other ideas  .  consider trying  Almond milk     Will notify you  of labs when available.   Health Maintenance, Female Adopting a healthy lifestyle and getting preventive care can go a long way to promote health and wellness. Talk with your health care provider about what schedule of regular examinations is right for you. This is a good chance for you to check in with your provider about disease prevention and staying healthy. In between checkups, there are plenty of things you can do on your own. Experts have done a lot of research about which lifestyle changes and preventive measures are most likely to keep you healthy. Ask your health care provider for more information. Weight and diet Eat a healthy diet  Be sure to include plenty of vegetables, fruits, low-fat dairy products, and lean protein.  Do not eat a lot of foods high in solid fats, added sugars, or salt.  Get regular exercise. This is one of the most important things you can do for your health. ? Most adults should exercise for at least 150  minutes each week. The exercise should increase your heart rate and make you sweat (moderate-intensity exercise). ? Most adults should also do strengthening exercises at least twice a week. This is in addition to the moderate-intensity  exercise.  Maintain a healthy weight  Body mass index (BMI) is a measurement that can be used to identify possible weight problems. It estimates body fat based on height and weight. Your health care provider can help determine your BMI and help you achieve or maintain a healthy weight.  For females 65 years of age and older: ? A BMI below 18.5 is considered underweight. ? A BMI of 18.5 to 24.9 is normal. ? A BMI of 25 to 29.9 is considered overweight. ? A BMI of 30 and above is considered obese.  Watch levels of cholesterol and blood lipids  You should start having your blood tested for lipids and cholesterol at 71 years of age, then have this test every 5 years.  You may need to have your cholesterol levels checked more often if: ? Your lipid or cholesterol levels are high. ? You are older than 71 years of age. ? You are at high risk for heart disease.  Cancer screening Lung Cancer  Lung cancer screening is recommended for adults 11-70 years old who are at high risk for lung cancer because of a history of smoking.  A yearly low-dose CT scan of the lungs is recommended for people who: ? Currently smoke. ? Have quit within the past 15 years. ? Have at least a 30-pack-year history of smoking. A pack year is smoking an average of one pack of cigarettes a day for 1 year.  Yearly screening should continue until it has been 15 years since you quit.  Yearly screening should stop if you develop a health problem that would prevent you from having lung cancer treatment.  Breast Cancer  Practice breast self-awareness. This means understanding how your breasts normally appear and feel.  It also means doing regular breast self-exams. Let your health care provider know about any changes, no matter how small.  If you are in your 20s or 30s, you should have a clinical breast exam (CBE) by a health care provider every 1-3 years as part of a regular health exam.  If you are 66 or older, have  a CBE every year. Also consider having a breast X-ray (mammogram) every year.  If you have a family history of breast cancer, talk to your health care provider about genetic screening.  If you are at high risk for breast cancer, talk to your health care provider about having an MRI and a mammogram every year.  Breast cancer gene (BRCA) assessment is recommended for women who have family members with BRCA-related cancers. BRCA-related cancers include: ? Breast. ? Ovarian. ? Tubal. ? Peritoneal cancers.  Results of the assessment will determine the need for genetic counseling and BRCA1 and BRCA2 testing.  Cervical Cancer Your health care provider may recommend that you be screened regularly for cancer of the pelvic organs (ovaries, uterus, and vagina). This screening involves a pelvic examination, including checking for microscopic changes to the surface of your cervix (Pap test). You may be encouraged to have this screening done every 3 years, beginning at age 45.  For women ages 68-65, health care providers may recommend pelvic exams and Pap testing every 3 years, or they may recommend the Pap and pelvic exam, combined with testing for human papilloma virus (HPV), every 5 years. Some types  of HPV increase your risk of cervical cancer. Testing for HPV may also be done on women of any age with unclear Pap test results.  Other health care providers may not recommend any screening for nonpregnant women who are considered low risk for pelvic cancer and who do not have symptoms. Ask your health care provider if a screening pelvic exam is right for you.  If you have had past treatment for cervical cancer or a condition that could lead to cancer, you need Pap tests and screening for cancer for at least 20 years after your treatment. If Pap tests have been discontinued, your risk factors (such as having a new sexual partner) need to be reassessed to determine if screening should resume. Some women have  medical problems that increase the chance of getting cervical cancer. In these cases, your health care provider may recommend more frequent screening and Pap tests.  Colorectal Cancer  This type of cancer can be detected and often prevented.  Routine colorectal cancer screening usually begins at 71 years of age and continues through 71 years of age.  Your health care provider may recommend screening at an earlier age if you have risk factors for colon cancer.  Your health care provider may also recommend using home test kits to check for hidden blood in the stool.  A small camera at the end of a tube can be used to examine your colon directly (sigmoidoscopy or colonoscopy). This is done to check for the earliest forms of colorectal cancer.  Routine screening usually begins at age 67.  Direct examination of the colon should be repeated every 5-10 years through 71 years of age. However, you may need to be screened more often if early forms of precancerous polyps or small growths are found.  Skin Cancer  Check your skin from head to toe regularly.  Tell your health care provider about any new moles or changes in moles, especially if there is a change in a mole's shape or color.  Also tell your health care provider if you have a mole that is larger than the size of a pencil eraser.  Always use sunscreen. Apply sunscreen liberally and repeatedly throughout the day.  Protect yourself by wearing long sleeves, pants, a wide-brimmed hat, and sunglasses whenever you are outside.  Heart disease, diabetes, and high blood pressure  High blood pressure causes heart disease and increases the risk of stroke. High blood pressure is more likely to develop in: ? People who have blood pressure in the high end of the normal range (130-139/85-89 mm Hg). ? People who are overweight or obese. ? People who are African American.  If you are 42-70 years of age, have your blood pressure checked every 3-5  years. If you are 27 years of age or older, have your blood pressure checked every year. You should have your blood pressure measured twice-once when you are at a hospital or clinic, and once when you are not at a hospital or clinic. Record the average of the two measurements. To check your blood pressure when you are not at a hospital or clinic, you can use: ? An automated blood pressure machine at a pharmacy. ? A home blood pressure monitor.  If you are between 28 years and 85 years old, ask your health care provider if you should take aspirin to prevent strokes.  Have regular diabetes screenings. This involves taking a blood sample to check your fasting blood sugar level. ? If you are  at a normal weight and have a low risk for diabetes, have this test once every three years after 71 years of age. ? If you are overweight and have a high risk for diabetes, consider being tested at a younger age or more often. Preventing infection Hepatitis B  If you have a higher risk for hepatitis B, you should be screened for this virus. You are considered at high risk for hepatitis B if: ? You were born in a country where hepatitis B is common. Ask your health care provider which countries are considered high risk. ? Your parents were born in a high-risk country, and you have not been immunized against hepatitis B (hepatitis B vaccine). ? You have HIV or AIDS. ? You use needles to inject street drugs. ? You live with someone who has hepatitis B. ? You have had sex with someone who has hepatitis B. ? You get hemodialysis treatment. ? You take certain medicines for conditions, including cancer, organ transplantation, and autoimmune conditions.  Hepatitis C  Blood testing is recommended for: ? Everyone born from [birth year] through 1964. ? Anyone with known risk factors for hepatitis C.  Sexually transmitted infections (STIs)  You should be screened for sexually transmitted infections (STIs) including  gonorrhea and chlamydia if: ? You are sexually active and are younger than 71 years of age. ? You are older than 71 years of age and your health care provider tells you that you are at risk for this type of infection. ? Your sexual activity has changed since you were last screened and you are at an increased risk for chlamydia or gonorrhea. Ask your health care provider if you are at risk.  If you do not have HIV, but are at risk, it may be recommended that you take a prescription medicine daily to prevent HIV infection. This is called pre-exposure prophylaxis (PrEP). You are considered at risk if: ? You are sexually active and do not regularly use condoms or know the HIV status of your partner(s). ? You take drugs by injection. ? You are sexually active with a partner who has HIV.  Talk with your health care provider about whether you are at high risk of being infected with HIV. If you choose to begin PrEP, you should first be tested for HIV. You should then be tested every 3 months for as long as you are taking PrEP. Pregnancy  If you are premenopausal and you may become pregnant, ask your health care provider about preconception counseling.  If you may become pregnant, take 400 to 800 micrograms (mcg) of folic acid every day.  If you want to prevent pregnancy, talk to your health care provider about birth control (contraception). Osteoporosis and menopause  Osteoporosis is a disease in which the bones lose minerals and strength with aging. This can result in serious bone fractures. Your risk for osteoporosis can be identified using a bone density scan.  If you are 22 years of age or older, or if you are at risk for osteoporosis and fractures, ask your health care provider if you should be screened.  Ask your health care provider whether you should take a calcium or vitamin D supplement to lower your risk for osteoporosis.  Menopause may have certain physical symptoms and  risks.  Hormone replacement therapy may reduce some of these symptoms and risks. Talk to your health care provider about whether hormone replacement therapy is right for you. Follow these instructions at home:  Schedule regular health,  dental, and eye exams.  Stay current with your immunizations.  Do not use any tobacco products including cigarettes, chewing tobacco, or electronic cigarettes.  If you are pregnant, do not drink alcohol.  If you are breastfeeding, limit how much and how often you drink alcohol.  Limit alcohol intake to no more than 1 drink per day for nonpregnant women. One drink equals 12 ounces of beer, 5 ounces of wine, or 1 ounces of hard liquor.  Do not use street drugs.  Do not share needles.  Ask your health care provider for help if you need support or information about quitting drugs.  Tell your health care provider if you often feel depressed.  Tell your health care provider if you have ever been abused or do not feel safe at home. This information is not intended to replace advice given to you by your health care provider. Make sure you discuss any questions you have with your health care provider. Document Released: 07/18/2010 Document Revised: 06/10/2015 Document Reviewed: 10/06/2014 Elsevier Interactive Patient Education  2018 [place]. Hayes Czaja M.D.

## 2017-04-17 ENCOUNTER — Encounter: Payer: Self-pay | Admitting: Internal Medicine

## 2017-04-17 ENCOUNTER — Ambulatory Visit (INDEPENDENT_AMBULATORY_CARE_PROVIDER_SITE_OTHER): Payer: Medicare Other | Admitting: Internal Medicine

## 2017-04-17 VITALS — BP 136/76 | HR 70 | Temp 98.0°F | Ht 59.0 in | Wt 138.9 lb

## 2017-04-17 DIAGNOSIS — R7309 Other abnormal glucose: Secondary | ICD-10-CM | POA: Diagnosis not present

## 2017-04-17 DIAGNOSIS — R413 Other amnesia: Secondary | ICD-10-CM

## 2017-04-17 DIAGNOSIS — R7301 Impaired fasting glucose: Secondary | ICD-10-CM

## 2017-04-17 DIAGNOSIS — Z79899 Other long term (current) drug therapy: Secondary | ICD-10-CM

## 2017-04-17 DIAGNOSIS — K580 Irritable bowel syndrome with diarrhea: Secondary | ICD-10-CM | POA: Diagnosis not present

## 2017-04-17 DIAGNOSIS — I10 Essential (primary) hypertension: Secondary | ICD-10-CM

## 2017-04-17 DIAGNOSIS — F411 Generalized anxiety disorder: Secondary | ICD-10-CM | POA: Diagnosis not present

## 2017-04-17 DIAGNOSIS — K219 Gastro-esophageal reflux disease without esophagitis: Secondary | ICD-10-CM

## 2017-04-17 LAB — CBC WITH DIFFERENTIAL/PLATELET
BASOS ABS: 0.1 10*3/uL (ref 0.0–0.1)
BASOS PCT: 1 % (ref 0.0–3.0)
EOS ABS: 0.3 10*3/uL (ref 0.0–0.7)
Eosinophils Relative: 3.9 % (ref 0.0–5.0)
HEMATOCRIT: 42.4 % (ref 36.0–46.0)
Hemoglobin: 14.1 g/dL (ref 12.0–15.0)
LYMPHS PCT: 26.2 % (ref 12.0–46.0)
Lymphs Abs: 1.8 10*3/uL (ref 0.7–4.0)
MCHC: 33.2 g/dL (ref 30.0–36.0)
MCV: 87.8 fl (ref 78.0–100.0)
MONO ABS: 0.5 10*3/uL (ref 0.1–1.0)
Monocytes Relative: 6.6 % (ref 3.0–12.0)
Neutro Abs: 4.4 10*3/uL (ref 1.4–7.7)
Neutrophils Relative %: 62.3 % (ref 43.0–77.0)
PLATELETS: 301 10*3/uL (ref 150.0–400.0)
RBC: 4.82 Mil/uL (ref 3.87–5.11)
RDW: 14.4 % (ref 11.5–15.5)
WBC: 7 10*3/uL (ref 4.0–10.5)

## 2017-04-17 LAB — BASIC METABOLIC PANEL
BUN: 14 mg/dL (ref 6–23)
CHLORIDE: 99 meq/L (ref 96–112)
CO2: 31 mEq/L (ref 19–32)
Calcium: 9.9 mg/dL (ref 8.4–10.5)
Creatinine, Ser: 0.85 mg/dL (ref 0.40–1.20)
GFR: 70.05 mL/min (ref >60.00)
Glucose, Bld: 78 mg/dL (ref 70–99)
POTASSIUM: 4.1 meq/L (ref 3.5–5.1)
SODIUM: 137 meq/L (ref 135–145)

## 2017-04-17 LAB — HEPATIC FUNCTION PANEL
ALK PHOS: 61 U/L (ref 39–117)
ALT: 12 U/L (ref 0–35)
AST: 14 U/L (ref 0–37)
Albumin: 4.1 g/dL (ref 3.5–5.2)
BILIRUBIN DIRECT: 0.1 mg/dL (ref 0.0–0.3)
BILIRUBIN TOTAL: 0.5 mg/dL (ref 0.2–1.2)
Total Protein: 6.6 g/dL (ref 6.0–8.3)

## 2017-04-17 LAB — LIPID PANEL
CHOL/HDL RATIO: 2
Cholesterol: 153 mg/dL (ref 0–200)
HDL: 79.6 mg/dL (ref >39.00)
LDL CALC: 55 mg/dL (ref 0–99)
NONHDL: 73.17
Triglycerides: 92 mg/dL (ref 0.0–149.0)
VLDL: 18.4 mg/dL (ref 0.0–40.0)

## 2017-04-17 LAB — TSH: TSH: 1.49 u[IU]/mL (ref 0.35–4.50)

## 2017-04-17 LAB — HEMOGLOBIN A1C: HEMOGLOBIN A1C: 5.6 % (ref 4.6–6.5)

## 2017-04-17 NOTE — Patient Instructions (Addendum)
Will notify you  of labs when available.  Will send   Flag to dr N for any other ideas  .  consider trying  Almond milk     Will notify you  of labs when available.   Health Maintenance, Female Adopting a healthy lifestyle and getting preventive care can go a long way to promote health and wellness. Talk with your health care provider about what schedule of regular examinations is right for you. This is a good chance for you to check in with your provider about disease prevention and staying healthy. In between checkups, there are plenty of things you can do on your own. Experts have done a lot of research about which lifestyle changes and preventive measures are most likely to keep you healthy. Ask your health care provider for more information. Weight and diet Eat a healthy diet  Be sure to include plenty of vegetables, fruits, low-fat dairy products, and lean protein.  Do not eat a lot of foods high in solid fats, added sugars, or salt.  Get regular exercise. This is one of the most important things you can do for your health. ? Most adults should exercise for at least 150 minutes each week. The exercise should increase your heart rate and make you sweat (moderate-intensity exercise). ? Most adults should also do strengthening exercises at least twice a week. This is in addition to the moderate-intensity exercise.  Maintain a healthy weight  Body mass index (BMI) is a measurement that can be used to identify possible weight problems. It estimates body fat based on height and weight. Your health care provider can help determine your BMI and help you achieve or maintain a healthy weight.  For females 18 years of age and older: ? A BMI below 18.5 is considered underweight. ? A BMI of 18.5 to 24.9 is normal. ? A BMI of 25 to 29.9 is considered overweight. ? A BMI of 30 and above is considered obese.  Watch levels of cholesterol and blood lipids  You should start having your blood  tested for lipids and cholesterol at 71 years of age, then have this test every 5 years.  You may need to have your cholesterol levels checked more often if: ? Your lipid or cholesterol levels are high. ? You are older than 71 years of age. ? You are at high risk for heart disease.  Cancer screening Lung Cancer  Lung cancer screening is recommended for adults 61-85 years old who are at high risk for lung cancer because of a history of smoking.  A yearly low-dose CT scan of the lungs is recommended for people who: ? Currently smoke. ? Have quit within the past 15 years. ? Have at least a 30-pack-year history of smoking. A pack year is smoking an average of one pack of cigarettes a day for 1 year.  Yearly screening should continue until it has been 15 years since you quit.  Yearly screening should stop if you develop a health problem that would prevent you from having lung cancer treatment.  Breast Cancer  Practice breast self-awareness. This means understanding how your breasts normally appear and feel.  It also means doing regular breast self-exams. Let your health care provider know about any changes, no matter how small.  If you are in your 20s or 30s, you should have a clinical breast exam (CBE) by a health care provider every 1-3 years as part of a regular health exam.  If you are  33 or older, have a CBE every year. Also consider having a breast X-ray (mammogram) every year.  If you have a family history of breast cancer, talk to your health care provider about genetic screening.  If you are at high risk for breast cancer, talk to your health care provider about having an MRI and a mammogram every year.  Breast cancer gene (BRCA) assessment is recommended for women who have family members with BRCA-related cancers. BRCA-related cancers include: ? Breast. ? Ovarian. ? Tubal. ? Peritoneal cancers.  Results of the assessment will determine the need for genetic counseling and  BRCA1 and BRCA2 testing.  Cervical Cancer Your health care provider may recommend that you be screened regularly for cancer of the pelvic organs (ovaries, uterus, and vagina). This screening involves a pelvic examination, including checking for microscopic changes to the surface of your cervix (Pap test). You may be encouraged to have this screening done every 3 years, beginning at age 53.  For women ages 71-65, health care providers may recommend pelvic exams and Pap testing every 3 years, or they may recommend the Pap and pelvic exam, combined with testing for human papilloma virus (HPV), every 5 years. Some types of HPV increase your risk of cervical cancer. Testing for HPV may also be done on women of any age with unclear Pap test results.  Other health care providers may not recommend any screening for nonpregnant women who are considered low risk for pelvic cancer and who do not have symptoms. Ask your health care provider if a screening pelvic exam is right for you.  If you have had past treatment for cervical cancer or a condition that could lead to cancer, you need Pap tests and screening for cancer for at least 20 years after your treatment. If Pap tests have been discontinued, your risk factors (such as having a new sexual partner) need to be reassessed to determine if screening should resume. Some women have medical problems that increase the chance of getting cervical cancer. In these cases, your health care provider may recommend more frequent screening and Pap tests.  Colorectal Cancer  This type of cancer can be detected and often prevented.  Routine colorectal cancer screening usually begins at 71 years of age and continues through 71 years of age.  Your health care provider may recommend screening at an earlier age if you have risk factors for colon cancer.  Your health care provider may also recommend using home test kits to check for hidden blood in the stool.  A small camera  at the end of a tube can be used to examine your colon directly (sigmoidoscopy or colonoscopy). This is done to check for the earliest forms of colorectal cancer.  Routine screening usually begins at age 67.  Direct examination of the colon should be repeated every 5-10 years through 71 years of age. However, you may need to be screened more often if early forms of precancerous polyps or small growths are found.  Skin Cancer  Check your skin from head to toe regularly.  Tell your health care provider about any new moles or changes in moles, especially if there is a change in a mole's shape or color.  Also tell your health care provider if you have a mole that is larger than the size of a pencil eraser.  Always use sunscreen. Apply sunscreen liberally and repeatedly throughout the day.  Protect yourself by wearing long sleeves, pants, a wide-brimmed hat, and sunglasses whenever you  are outside.  Heart disease, diabetes, and high blood pressure  High blood pressure causes heart disease and increases the risk of stroke. High blood pressure is more likely to develop in: ? People who have blood pressure in the high end of the normal range (130-139/85-89 mm Hg). ? People who are overweight or obese. ? People who are African American.  If you are 4-73 years of age, have your blood pressure checked every 3-5 years. If you are 64 years of age or older, have your blood pressure checked every year. You should have your blood pressure measured twice-once when you are at a hospital or clinic, and once when you are not at a hospital or clinic. Record the average of the two measurements. To check your blood pressure when you are not at a hospital or clinic, you can use: ? An automated blood pressure machine at a pharmacy. ? A home blood pressure monitor.  If you are between 33 years and 74 years old, ask your health care provider if you should take aspirin to prevent strokes.  Have regular diabetes  screenings. This involves taking a blood sample to check your fasting blood sugar level. ? If you are at a normal weight and have a low risk for diabetes, have this test once every three years after 71 years of age. ? If you are overweight and have a high risk for diabetes, consider being tested at a younger age or more often. Preventing infection Hepatitis B  If you have a higher risk for hepatitis B, you should be screened for this virus. You are considered at high risk for hepatitis B if: ? You were born in a country where hepatitis B is common. Ask your health care provider which countries are considered high risk. ? Your parents were born in a high-risk country, and you have not been immunized against hepatitis B (hepatitis B vaccine). ? You have HIV or AIDS. ? You use needles to inject street drugs. ? You live with someone who has hepatitis B. ? You have had sex with someone who has hepatitis B. ? You get hemodialysis treatment. ? You take certain medicines for conditions, including cancer, organ transplantation, and autoimmune conditions.  Hepatitis C  Blood testing is recommended for: ? Everyone born from [birth year] through 1964. ? Anyone with known risk factors for hepatitis C.  Sexually transmitted infections (STIs)  You should be screened for sexually transmitted infections (STIs) including gonorrhea and chlamydia if: ? You are sexually active and are younger than 71 years of age. ? You are older than 71 years of age and your health care provider tells you that you are at risk for this type of infection. ? Your sexual activity has changed since you were last screened and you are at an increased risk for chlamydia or gonorrhea. Ask your health care provider if you are at risk.  If you do not have HIV, but are at risk, it may be recommended that you take a prescription medicine daily to prevent HIV infection. This is called pre-exposure prophylaxis (PrEP). You are considered at risk  if: ? You are sexually active and do not regularly use condoms or know the HIV status of your partner(s). ? You take drugs by injection. ? You are sexually active with a partner who has HIV.  Talk with your health care provider about whether you are at high risk of being infected with HIV. If you choose to begin PrEP, you should first be  tested for HIV. You should then be tested every 3 months for as long as you are taking PrEP. Pregnancy  If you are premenopausal and you may become pregnant, ask your health care provider about preconception counseling.  If you may become pregnant, take 400 to 800 micrograms (mcg) of folic acid every day.  If you want to prevent pregnancy, talk to your health care provider about birth control (contraception). Osteoporosis and menopause  Osteoporosis is a disease in which the bones lose minerals and strength with aging. This can result in serious bone fractures. Your risk for osteoporosis can be identified using a bone density scan.  If you are 70 years of age or older, or if you are at risk for osteoporosis and fractures, ask your health care provider if you should be screened.  Ask your health care provider whether you should take a calcium or vitamin D supplement to lower your risk for osteoporosis.  Menopause may have certain physical symptoms and risks.  Hormone replacement therapy may reduce some of these symptoms and risks. Talk to your health care provider about whether hormone replacement therapy is right for you. Follow these instructions at home:  Schedule regular health, dental, and eye exams.  Stay current with your immunizations.  Do not use any tobacco products including cigarettes, chewing tobacco, or electronic cigarettes.  If you are pregnant, do not drink alcohol.  If you are breastfeeding, limit how much and how often you drink alcohol.  Limit alcohol intake to no more than 1 drink per day for nonpregnant women. One drink  equals 12 ounces of beer, 5 ounces of wine, or 1 ounces of hard liquor.  Do not use street drugs.  Do not share needles.  Ask your health care provider for help if you need support or information about quitting drugs.  Tell your health care provider if you often feel depressed.  Tell your health care provider if you have ever been abused or do not feel safe at home. This information is not intended to replace advice given to you by your health care provider. Make sure you discuss any questions you have with your health care provider. Document Released: 07/18/2010 Document Revised: 06/10/2015 Document Reviewed: 10/06/2014 Elsevier Interactive Patient Education  [employer].

## 2017-04-24 ENCOUNTER — Telehealth: Payer: Self-pay

## 2017-04-24 ENCOUNTER — Other Ambulatory Visit: Payer: Self-pay

## 2017-04-24 MED ORDER — COLESTIPOL HCL 1 G PO TABS: 1.0000 g | ORAL_TABLET | Freq: Two times a day (BID) | ORAL | 0 refills | Status: DC

## 2017-04-24 NOTE — Telephone Encounter (Signed)
-----   Message from Mauri Pole, MD sent at 04/24/2017  1:01 PM EDT ----- Hi Dr Regis Bill,  Will bring her in soon and see what we can do to help improve her symptoms. She has IBS-predominant diarrhea. Colonoscopy was negative for colitis.  Beth, can you please send Rx for Colestid 1gm BID X 1 month . If she has bile salt induced diarrhea, she will benefit from it. Also schedule follow up visit Thank you Margarette Asal ----- Message ----- From: Burnis Medin, MD Sent: 04/17/2017   5:26 PM To: Mauri Pole, MD  Please advise  If you have any other  Help with her  Frequent diarrhea loose stools that are  Limiting her  quality of life and activity .  Does she ned to make another OV with you?  Thanks [facility]

## 2017-04-24 NOTE — Telephone Encounter (Signed)
Spoke with the patient. She is interested in trying the medication Colestid. Appointment scheduled per her preference with Dr Silverio Decamp. She will call with any questions or concerns.

## 2017-04-24 NOTE — Telephone Encounter (Signed)
Left message to call.

## 2017-05-04 ENCOUNTER — Other Ambulatory Visit: Payer: Self-pay | Admitting: Internal Medicine

## 2017-05-23 ENCOUNTER — Telehealth: Payer: Self-pay | Admitting: Gastroenterology

## 2017-05-23 NOTE — Telephone Encounter (Signed)
Left message that I am calling her back.

## 2017-05-23 NOTE — Telephone Encounter (Signed)
Patient states she thinks she is having side affects from medication colestipol and has stopped taking it for the time being. Patient states she broke out in a rash and was constipated. Pt would like advice.

## 2017-05-24 NOTE — Telephone Encounter (Signed)
Spoke with the patient. She states her bowel movements are "formed" and she is having daily bowel movements. She has stopped the medication stating she has this kind of reaction with a lot of medications. OV scheduled to discuss her options.

## 2017-06-01 ENCOUNTER — Encounter: Payer: Self-pay | Admitting: Nurse Practitioner

## 2017-06-01 ENCOUNTER — Ambulatory Visit (INDEPENDENT_AMBULATORY_CARE_PROVIDER_SITE_OTHER): Payer: Medicare Other | Admitting: Nurse Practitioner

## 2017-06-01 VITALS — BP 112/64 | HR 70 | Ht 59.25 in | Wt 137.4 lb

## 2017-06-01 DIAGNOSIS — K580 Irritable bowel syndrome with diarrhea: Secondary | ICD-10-CM

## 2017-06-01 DIAGNOSIS — R197 Diarrhea, unspecified: Secondary | ICD-10-CM

## 2017-06-01 DIAGNOSIS — L52 Erythema nodosum: Secondary | ICD-10-CM

## 2017-06-01 NOTE — Progress Notes (Signed)
IMPRESSION and PLAN:    #1.  72 yo female with hx of erythema nodosum, medication related. Now with recurrent flare after taking Colestid for a month. Drug stopped on 05/24/17. Feels lesions are improving though also feels like new ones are forming.  -offered course of steroids, she declines.  -she declines NSAIDs, causes gastric distress -Lesions should continue to improve since Colestid has been stopped, assuming this was in fact the culprit.  -colestid placed on allergy list    #2.  D-IBS.  -wlll try imodium as needed. If no improvement she will call. May try Lomotil at that pont.       HPI:    Chief Complaint: skin reaction to colestid  Patient is a 72 year old female known to Dr. Silverio Decamp  She has history of D-IBS / GERD/ post Nissen fundoplication and slipped Nissen with recurrent hernia.    Patient was started on Colestid for diarrhea 04/24/17. Diarrhea improved, in fact patient almost became constipated on it. After a month of taking the medication she developed a skin reaction. The tender knots arose on legs and some developing on her back. Lesions itch as night. Colestid stopped and within a couple of days the diarrhea returned. She has a hx of erythema nodosum and another medication we gave her for GERD years ago caused flare.. She doesn't wasn't steroids to treat the eruptions, feels lesions are resolving though feels like there are a few new ones forming. .    Review of systems:     No chest pain. No SOB. Positive for body aches. Positive for subjective fevers.   Past Medical History:  Diagnosis Date  . Allergy   . Anxiety   . Arthritis   . Blood transfusion without reported diagnosis   . Cataract    bil eyes  . Depression   . Diabetes mellitus without complication (HCC)    pre-diabetic, no meds, diet controlled  . Diverticulosis of colon   . Enteritis   . Erythema nodosum    Tends to be seasonal has had a negative chest x-ray in the past negative PPD  .  Fibromyalgia   . GERD (gastroesophageal reflux disease)    History of stricture and dilatation and Nissen surgery  . Hypertension   . IBS (irritable bowel syndrome)   . Meniere's disease    ? dx per dr Marcelline Mates  . Mood disorder ([place])   . Neuromuscular disorder ([place])    fibromylagia  . Osteopenia   . Sleep apnea    [facility].  pt does not wear a c-pap.    Patient's surgical history, family medical history, social history, medications and allergies were all reviewed in Epic    Physical Exam:     BP 112/64   Pulse 70   Ht 4' 11.25" (1.505 m)   Wt 137 lb 6.4 oz (62.3 kg)   BMI 27.52 kg/m   GENERAL: pleasant well developed white female in NAD PSYCH: :cooperative, normal ~ 2 inch roud purplish red discoloration. In center there is a 3/4 inch firm, tender lesion. Similar but smaller lesions on right thigh, right shin and a few similar lesions arising on abdomen and back.  CARDIAC:  RRR,no peripheral edema PULM: Normal respiratory effort, lungs CTA bilaterally, no wheezing ABDOMEN:  Nondistended, soft, nontender. No obvious masses, no hepatomegaly,  normal bowel sounds SKIN:  Left inner thigh with  NEURO: Alert and oriented x 3, no focal neurologic deficits   Tye Savoy , NP  06/01/2017, 3:28 PM

## 2017-06-01 NOTE — Patient Instructions (Signed)
If you are age 71 or older, your body mass index should be between 23-30. Your Body mass index is 27.52 kg/m. If this is out of the aforementioned range listed, please consider follow up with your Primary Care Provider.  If you are age 41 or younger, your body mass index should be between 19-25. Your Body mass index is 27.52 kg/m. If this is out of the aformentioned range listed, please consider follow up with your Primary Care Provider.   Take Imodium 2 mg as directed.  Thank you for choosing me and [facility].   Tye Savoy, NP

## 2017-06-03 ENCOUNTER — Encounter: Payer: Self-pay | Admitting: Nurse Practitioner

## 2017-06-04 NOTE — Progress Notes (Signed)
Reviewed and agree with documentation and assessment and plan. K. Veena Nandigam , MD   

## 2017-06-07 ENCOUNTER — Other Ambulatory Visit: Payer: Self-pay | Admitting: Internal Medicine

## 2017-06-24 ENCOUNTER — Encounter

## 2017-07-09 DIAGNOSIS — S42352A Displaced comminuted fracture of shaft of humerus, left arm, initial encounter for closed fracture: Secondary | ICD-10-CM | POA: Diagnosis not present

## 2017-07-10 ENCOUNTER — Other Ambulatory Visit: Payer: Self-pay | Admitting: Orthopedic Surgery

## 2017-07-10 ENCOUNTER — Ambulatory Visit: Payer: Medicare Other | Admitting: Gastroenterology

## 2017-07-10 ENCOUNTER — Other Ambulatory Visit: Payer: Self-pay | Admitting: Internal Medicine

## 2017-07-10 ENCOUNTER — Telehealth: Payer: Self-pay | Admitting: Internal Medicine

## 2017-07-10 ENCOUNTER — Ambulatory Visit
Admission: RE | Admit: 2017-07-10 | Discharge: 2017-07-10 | Disposition: A | Discharge status: Home / Self Care | Payer: Medicare Other | Source: Ambulatory Visit | Attending: Orthopedic Surgery | Admitting: Orthopedic Surgery

## 2017-07-10 DIAGNOSIS — M25512 Pain in left shoulder: Secondary | ICD-10-CM

## 2017-07-10 DIAGNOSIS — S42225A 2-part nondisplaced fracture of surgical neck of left humerus, initial encounter for closed fracture: Secondary | ICD-10-CM | POA: Diagnosis not present

## 2017-07-10 DIAGNOSIS — I10 Essential (primary) hypertension: Secondary | ICD-10-CM

## 2017-07-10 DIAGNOSIS — S42212A Unspecified displaced fracture of surgical neck of left humerus, initial encounter for closed fracture: Secondary | ICD-10-CM | POA: Diagnosis not present

## 2017-07-10 DIAGNOSIS — Z79899 Other long term (current) drug therapy: Secondary | ICD-10-CM

## 2017-07-10 NOTE — Telephone Encounter (Signed)
Copied from [place] [id number]. Topic: Quick Communication - Rx Refill/Question >> Jul 10, 2017 10:25 AM Margot Ables wrote: Medication: triamterene-hydrochlorothiazide (MAXZIDE-25) 37.5-25 MG tablet - pt states the pharmacy changed manufacturer last time and the new pill doesn't split well. She uses a pill cutter but half of the pill gets crushed. She is asking if we can order the oval pill she had before. I advised pt that pharmacies change supplies/manufacturers that we cannot control. Pt asked if we can have the pharmacy cut pills in half for her or if there is an alternative. Has the patient contacted their pharmacy? Yes - advised to call doctor Preferred Pharmacy (with phone number or street name): [place], [place] - [address] [phone] (Phone) [phone] (Fax)  Pt fell yesterday and went to [facility] - she will have shoulder surgery this Thursday

## 2017-07-11 DIAGNOSIS — S42352D Displaced comminuted fracture of shaft of humerus, left arm, subsequent encounter for fracture with routine healing: Secondary | ICD-10-CM | POA: Diagnosis not present

## 2017-07-11 NOTE — Telephone Encounter (Signed)
See other refill request.  Has message from [facility].

## 2017-07-12 DIAGNOSIS — S42242A 4-part fracture of surgical neck of left humerus, initial encounter for closed fracture: Secondary | ICD-10-CM | POA: Diagnosis not present

## 2017-07-12 DIAGNOSIS — G8918 Other acute postprocedural pain: Secondary | ICD-10-CM | POA: Diagnosis not present

## 2017-07-12 DIAGNOSIS — X58XXXA Exposure to other specified factors, initial encounter: Secondary | ICD-10-CM | POA: Diagnosis not present

## 2017-07-12 DIAGNOSIS — S42202A Unspecified fracture of upper end of left humerus, initial encounter for closed fracture: Secondary | ICD-10-CM | POA: Diagnosis not present

## 2017-07-12 DIAGNOSIS — Y999 Unspecified external cause status: Secondary | ICD-10-CM | POA: Diagnosis not present

## 2017-07-13 ENCOUNTER — Encounter: Payer: Self-pay | Admitting: Internal Medicine

## 2017-07-13 MED ORDER — HYDROCHLOROTHIAZIDE 12.5 MG PO CAPS: 12.5000 mg | ORAL_CAPSULE | Freq: Every day | ORAL | 1 refills | Status: DC

## 2017-07-13 NOTE — Telephone Encounter (Signed)
Rx changed to HCTZ 12.5mg  1po qd, #30 Pt aware. Lab check scheduled for 3-4 weeks out. BMP ordered.   Pt wanted Dr Regis Bill to know that she had surgery yesterday on her left shoulder.  Pt is taking Tramadol, Muscle Relaxer and Zofran -- new meds -- med list updated.  Pt seems to be doing okay but pain level is increasing now that nerve block is wearing off.   Will send to Dr Regis Bill as Juluis Rainier.

## 2017-07-13 NOTE — Telephone Encounter (Signed)
Pt was advised to call the office by the pharmacy for recommendation with her tablets she is cutting in half. Is there another medication we can switch her to that she wont have to cut in half. Pt is likely not getting adequate doses with current pills as they are "crushing" when she cuts them. Please advise Dr Regis Bill, thanks.

## 2017-07-13 NOTE — Telephone Encounter (Signed)
Should ask pharmacy if there is a better  Generic pill version  pill that can be cut  Otherwise can changes to   hctz 12.5mg   per day dsdp 30 refill x 1  ( single not double ingredient diuretic)  And check  BMP in 3 weeks  After changes

## 2017-07-23 DIAGNOSIS — S42202D Unspecified fracture of upper end of left humerus, subsequent encounter for fracture with routine healing: Secondary | ICD-10-CM | POA: Diagnosis not present

## 2017-07-31 ENCOUNTER — Other Ambulatory Visit (INDEPENDENT_AMBULATORY_CARE_PROVIDER_SITE_OTHER): Payer: Medicare Other

## 2017-07-31 DIAGNOSIS — Z79899 Other long term (current) drug therapy: Secondary | ICD-10-CM | POA: Diagnosis not present

## 2017-07-31 DIAGNOSIS — I10 Essential (primary) hypertension: Secondary | ICD-10-CM

## 2017-07-31 LAB — BASIC METABOLIC PANEL
BUN: 15 mg/dL (ref 6–23)
CALCIUM: 9.9 mg/dL (ref 8.4–10.5)
CO2: 29 mEq/L (ref 19–32)
CREATININE: 0.85 mg/dL (ref 0.40–1.20)
Chloride: 99 mEq/L (ref 96–112)
GFR: 69.99 mL/min (ref >60.00)
Glucose, Bld: 93 mg/dL (ref 70–99)
Potassium: 3.7 mEq/L (ref 3.5–5.1)
Sodium: 139 mEq/L (ref 135–145)

## 2017-08-06 DIAGNOSIS — S42202D Unspecified fracture of upper end of left humerus, subsequent encounter for fracture with routine healing: Secondary | ICD-10-CM | POA: Diagnosis not present

## 2017-08-14 NOTE — Progress Notes (Signed)
Chief Complaint  Patient presents with  . Follow-up    Pt had surgery about 5 weeks ago on broken left arm, still healing and having a lot of pain. Pt states that he does not feel steady on her feet.     HPI: Caitlin [name] 72 y.o. come in for Chronic disease management  Here with husband   Got e nodosum with ? colestepol for diarrhea?  Had shoulder surgery after a fall onway [facility] [employer]  To get a beverage and fell onshoulder   Fr 7 pins and a plate  shoulder break    Tylenol 3 x per day .   Nausea pill.   No narcotic noted to have  Low ox getting out of surgery    Hx of snoring  Remote hx of osa eval and borderline or not dxed years ago .   To have rehab next week.   Fu labs after change in bp med    Concern about balance veering  Tends to have this but noted after surgery and terrified of falling     ROS: See pertinent positives and negatives per HPI.  Past Medical History:  Diagnosis Date  . Allergy   . Anxiety   . Arthritis   . Blood transfusion without reported diagnosis   . Cataract    bil eyes  . Depression   . Diabetes mellitus without complication (HCC)    pre-diabetic, no meds, diet controlled  . Diverticulosis of colon   . Enteritis   . Erythema nodosum    Tends to be seasonal has had a negative chest x-ray in the past negative PPD  . Fibromyalgia   . GERD (gastroesophageal reflux disease)    History of stricture and dilatation and Nissen surgery  . Hypertension   . IBS (irritable bowel syndrome)   . Meniere's disease    ? dx per dr Marcelline Mates  . Mood disorder ([place])   . Neuromuscular disorder ([employer])    fibromylagia  . Osteopenia   . Sleep apnea    [facility].  pt does not wear a c-pap.    Family History  Problem Relation Age of Onset  . Depression Mother   . Kidney cancer Mother   . Depression Father   . Hiatal hernia Father   . COPD Sister        Deceased 01-02-10  . Nephrolithiasis Unknown   . Dementia Brother   . Colon  cancer Neg Hx   . Esophageal cancer Neg Hx   . Pancreatic cancer Neg Hx   . Rectal cancer Neg Hx   . Stomach cancer Neg Hx     Social History   Socioeconomic History  . Marital status: Married    Spouse name: Not on file  . Number of children: 1  . Years of education: Not on file  . Highest education level: Not on file  Occupational History  . Occupation: Retired  Scientific laboratory technician  . Financial resource strain: Not on file  . Food insecurity:    Worry: Not on file    Inability: Not on file  . Transportation needs:    Medical: Not on file    Non-medical: Not on file  Tobacco Use  . Smoking status: Never Smoker  . Smokeless tobacco: Never Used  Substance and Sexual Activity  . Alcohol use: No    Alcohol/week: 0.0 oz  . Drug use: No  . Sexual activity: Not on file  Lifestyle  .  Physical activity:    Days per week: Not on file    Minutes per session: Not on file  . Stress: Not on file  Relationships  . Social connections:    Talks on phone: Not on file    Gets together: Not on file    Attends religious service: Not on file    Active member of club or organization: Not on file    Attends meetings of clubs or organizations: Not on file    Relationship status: Not on file  Other Topics Concern  . Not on file  Social History Narrative   Unemployed   Married   [place] of 2    Has grandchildren out of state   No pets   Sis. died 15-Dec-2009 COPD    Outpatient Medications Prior to Visit  Medication Sig Dispense Refill  . baclofen (LIORESAL) 10 MG tablet Take 10 mg by mouth 3 (three) times daily.    . calcium carbonate (TUMS EX) 750 MG chewable tablet Chew 1 tablet by mouth as needed for heartburn.    . cholecalciferol (VITAMIN D) 1000 UNITS tablet Take 2,000 Units by mouth daily.     [name] dicyclomine (BENTYL) 10 MG capsule Take 1 capsule (10 mg total) by mouth every 8 (eight) hours as needed for spasms. 90 capsule 11  . escitalopram (LEXAPRO) 10 MG tablet TAKE 1 TABLET BY  MOUTH ONCE DAILY 90 tablet 1  . esomeprazole (NEXIUM) 20 MG capsule Take 20 mg by mouth daily at 12 noon. Reported on 03/08/2015    . FREESTYLE LITE test strip USE TO CHECK BLOOD SUGAR DAILY AND AS NEEDED 100 each 4  . hydrochlorothiazide (MICROZIDE) 12.5 MG capsule Take 1 capsule (12.5 mg total) by mouth daily. 30 capsule 1  . Lancets (FREESTYLE) lancets USE TO CHECK BLOOD SUGAR DAILY AND AS NEEDED 100 each 4  . ondansetron (ZOFRAN-ODT) 4 MG disintegrating tablet Take 1 tablet by mouth every 4 (four) hours.    . potassium chloride (K-DUR,KLOR-CON) 10 MEQ tablet TAKE 1 TABLET BY MOUTH EVERY OTHER DAY WITH FLUID TABLET. 45 tablet 1  . triamterene-hydrochlorothiazide (MAXZIDE-25) 37.5-25 MG tablet TAKE 1/2 (ONE-HALF) TABLET BY MOUTH ONCE DAILY 45 tablet 0  . traMADol (ULTRAM) 50 MG tablet Take 2 tablets by mouth every 8 (eight) hours. Pt may take 1 tablet every 4 hours depending on pain.     No facility-administered medications prior to visit.      EXAM:  BP 122/78 (BP Location: Right Arm, Patient Position: Sitting, Cuff Size: Normal)   Pulse (!) 59   Temp 97.8 F (36.6 C) (Oral)   Wt 139 lb 9.6 oz (63.3 kg)   BMI 27.96 kg/m   Body mass index is 27.96 kg/m.  GENERAL: vitals reviewed and listed above, alert, oriented, appears well hydrated and in no acute distressleft arm in sling nl speech  HEENT: atraumatic, conjunctiva  clear, no obvious abnormalities on inspection of external nose and ears  Hs graduated lenses  NECK: no obvious masses on inspection palpation  LUNGS: clear to auscultation bilaterally, no wheezes, rales or rhonchi, good air movement CV: HRRR, no clubbing cyanosis or  peripheral edema nl cap refill  MS: moves all extremitiesleft ue in sling  PSYCH: pleasant and cooperative, no obvious depression or anxiety Gait hesitant  Slow but  Nl independent   Lab Results  Component Value Date   WBC 7.0 04/17/2017   HGB 14.1 04/17/2017   HCT 42.4 04/17/2017   PLT 301.0  04/17/2017   GLUCOSE 93 07/31/2017   CHOL 153 04/17/2017   TRIG 92.0 04/17/2017   HDL 79.60 04/17/2017   LDLCALC 55 04/17/2017   ALT 12 04/17/2017   AST 14 04/17/2017   NA 139 07/31/2017   K 3.7 07/31/2017   CL 99 07/31/2017   CREATININE 0.85 07/31/2017   BUN 15 07/31/2017   CO2 29 07/31/2017   TSH 1.49 04/17/2017   HGBA1C 5.6 04/17/2017   MICROALBUR 1.6 04/26/2011   BP Readings from Last 3 Encounters:  08/15/17 122/78  06/01/17 112/64  04/17/17 136/76    ASSESSMENT AND PLAN:  Discussed the following assessment and plan:  Essential hypertension  Medication management  Prediabetes  History of fracture due to fall  Imbalance - dsic with pt and backto  neuro   Snoring - hx hypoxemia post surgery  will refer for evalu had declined  in recetnt past  since testing years ago  - Plan: Ambulatory referral to Pulmonology  Hypoxia, anesthesia related  - consdier sa - Plan: Ambulatory referral to Pulmonology Total visit 63mins > 50% spent counseling and coordinating care as indicated in above note and in instructions to patient .   -Patient advised to return or notify health care team  if  new concerns arise.  Patient Instructions  You potassium is normal   Plan  Discussing   With therapist for more balance exercising and  Fall prevention.   Plan eventually get with Dr Delice Lesch .   About the balance .   Eye check .   Not  sure if you could have sleep apnea    Can do consult .   ROV in 4-6 months or as needed.            Standley Brooking. Yaritzy Huser M.D.

## 2017-08-15 ENCOUNTER — Ambulatory Visit (INDEPENDENT_AMBULATORY_CARE_PROVIDER_SITE_OTHER): Payer: Medicare Other | Admitting: Internal Medicine

## 2017-08-15 ENCOUNTER — Encounter: Payer: Self-pay | Admitting: Internal Medicine

## 2017-08-15 VITALS — BP 122/78 | HR 59 | Temp 97.8°F | Wt 139.6 lb

## 2017-08-15 DIAGNOSIS — Z79899 Other long term (current) drug therapy: Secondary | ICD-10-CM | POA: Diagnosis not present

## 2017-08-15 DIAGNOSIS — Z8781 Personal history of (healed) traumatic fracture: Secondary | ICD-10-CM

## 2017-08-15 DIAGNOSIS — G4734 Idiopathic sleep related nonobstructive alveolar hypoventilation: Secondary | ICD-10-CM

## 2017-08-15 DIAGNOSIS — I10 Essential (primary) hypertension: Secondary | ICD-10-CM

## 2017-08-15 DIAGNOSIS — R0683 Snoring: Secondary | ICD-10-CM

## 2017-08-15 DIAGNOSIS — R7303 Prediabetes: Secondary | ICD-10-CM | POA: Diagnosis not present

## 2017-08-15 DIAGNOSIS — R2689 Other abnormalities of gait and mobility: Secondary | ICD-10-CM | POA: Diagnosis not present

## 2017-08-15 NOTE — Patient Instructions (Addendum)
You potassium is normal   Plan  Discussing   With therapist for more balance exercising and  Fall prevention.   Plan eventually get with Dr Delice Lesch .   About the balance .   Eye check .   Not  sure if you could have sleep apnea    Can do consult .   ROV in 4-6 months or as needed.

## 2017-08-20 DIAGNOSIS — M25612 Stiffness of left shoulder, not elsewhere classified: Secondary | ICD-10-CM | POA: Diagnosis not present

## 2017-08-20 DIAGNOSIS — S42202D Unspecified fracture of upper end of left humerus, subsequent encounter for fracture with routine healing: Secondary | ICD-10-CM | POA: Diagnosis not present

## 2017-08-20 DIAGNOSIS — M25512 Pain in left shoulder: Secondary | ICD-10-CM | POA: Diagnosis not present

## 2017-08-20 DIAGNOSIS — M6281 Muscle weakness (generalized): Secondary | ICD-10-CM | POA: Diagnosis not present

## 2017-08-22 DIAGNOSIS — M25512 Pain in left shoulder: Secondary | ICD-10-CM | POA: Diagnosis not present

## 2017-08-22 DIAGNOSIS — M25612 Stiffness of left shoulder, not elsewhere classified: Secondary | ICD-10-CM | POA: Diagnosis not present

## 2017-08-22 DIAGNOSIS — S42202D Unspecified fracture of upper end of left humerus, subsequent encounter for fracture with routine healing: Secondary | ICD-10-CM | POA: Diagnosis not present

## 2017-08-22 DIAGNOSIS — M6281 Muscle weakness (generalized): Secondary | ICD-10-CM | POA: Diagnosis not present

## 2017-08-27 DIAGNOSIS — M25612 Stiffness of left shoulder, not elsewhere classified: Secondary | ICD-10-CM | POA: Diagnosis not present

## 2017-08-27 DIAGNOSIS — M6281 Muscle weakness (generalized): Secondary | ICD-10-CM | POA: Diagnosis not present

## 2017-08-27 DIAGNOSIS — M25512 Pain in left shoulder: Secondary | ICD-10-CM | POA: Diagnosis not present

## 2017-08-27 DIAGNOSIS — S42202D Unspecified fracture of upper end of left humerus, subsequent encounter for fracture with routine healing: Secondary | ICD-10-CM | POA: Diagnosis not present

## 2017-08-29 DIAGNOSIS — M25512 Pain in left shoulder: Secondary | ICD-10-CM | POA: Diagnosis not present

## 2017-08-29 DIAGNOSIS — S42202D Unspecified fracture of upper end of left humerus, subsequent encounter for fracture with routine healing: Secondary | ICD-10-CM | POA: Diagnosis not present

## 2017-08-29 DIAGNOSIS — M25612 Stiffness of left shoulder, not elsewhere classified: Secondary | ICD-10-CM | POA: Diagnosis not present

## 2017-08-29 DIAGNOSIS — M6281 Muscle weakness (generalized): Secondary | ICD-10-CM | POA: Diagnosis not present

## 2017-09-03 DIAGNOSIS — M25512 Pain in left shoulder: Secondary | ICD-10-CM | POA: Diagnosis not present

## 2017-09-03 DIAGNOSIS — M25612 Stiffness of left shoulder, not elsewhere classified: Secondary | ICD-10-CM | POA: Diagnosis not present

## 2017-09-03 DIAGNOSIS — M6281 Muscle weakness (generalized): Secondary | ICD-10-CM | POA: Diagnosis not present

## 2017-09-03 DIAGNOSIS — S42202D Unspecified fracture of upper end of left humerus, subsequent encounter for fracture with routine healing: Secondary | ICD-10-CM | POA: Diagnosis not present

## 2017-09-04 ENCOUNTER — Other Ambulatory Visit: Payer: Self-pay | Admitting: Internal Medicine

## 2017-09-05 ENCOUNTER — Ambulatory Visit: Payer: Medicare Other | Admitting: Neurology

## 2017-09-05 DIAGNOSIS — M25612 Stiffness of left shoulder, not elsewhere classified: Secondary | ICD-10-CM | POA: Diagnosis not present

## 2017-09-05 DIAGNOSIS — S42202D Unspecified fracture of upper end of left humerus, subsequent encounter for fracture with routine healing: Secondary | ICD-10-CM | POA: Diagnosis not present

## 2017-09-05 DIAGNOSIS — M25512 Pain in left shoulder: Secondary | ICD-10-CM | POA: Diagnosis not present

## 2017-09-05 DIAGNOSIS — M6281 Muscle weakness (generalized): Secondary | ICD-10-CM | POA: Diagnosis not present

## 2017-09-10 DIAGNOSIS — M6281 Muscle weakness (generalized): Secondary | ICD-10-CM | POA: Diagnosis not present

## 2017-09-10 DIAGNOSIS — M25512 Pain in left shoulder: Secondary | ICD-10-CM | POA: Diagnosis not present

## 2017-09-10 DIAGNOSIS — M25612 Stiffness of left shoulder, not elsewhere classified: Secondary | ICD-10-CM | POA: Diagnosis not present

## 2017-09-10 DIAGNOSIS — S42202D Unspecified fracture of upper end of left humerus, subsequent encounter for fracture with routine healing: Secondary | ICD-10-CM | POA: Diagnosis not present

## 2017-09-12 DIAGNOSIS — S42202D Unspecified fracture of upper end of left humerus, subsequent encounter for fracture with routine healing: Secondary | ICD-10-CM | POA: Diagnosis not present

## 2017-09-12 DIAGNOSIS — M6281 Muscle weakness (generalized): Secondary | ICD-10-CM | POA: Diagnosis not present

## 2017-09-12 DIAGNOSIS — M25612 Stiffness of left shoulder, not elsewhere classified: Secondary | ICD-10-CM | POA: Diagnosis not present

## 2017-09-12 DIAGNOSIS — M25512 Pain in left shoulder: Secondary | ICD-10-CM | POA: Diagnosis not present

## 2017-09-13 ENCOUNTER — Other Ambulatory Visit: Payer: Self-pay | Admitting: Internal Medicine

## 2017-09-13 DIAGNOSIS — Z1231 Encounter for screening mammogram for malignant neoplasm of breast: Secondary | ICD-10-CM

## 2017-09-19 DIAGNOSIS — M25512 Pain in left shoulder: Secondary | ICD-10-CM | POA: Diagnosis not present

## 2017-09-20 DIAGNOSIS — M6281 Muscle weakness (generalized): Secondary | ICD-10-CM | POA: Diagnosis not present

## 2017-09-20 DIAGNOSIS — S42202D Unspecified fracture of upper end of left humerus, subsequent encounter for fracture with routine healing: Secondary | ICD-10-CM | POA: Diagnosis not present

## 2017-09-20 DIAGNOSIS — M25512 Pain in left shoulder: Secondary | ICD-10-CM | POA: Diagnosis not present

## 2017-09-20 DIAGNOSIS — M25612 Stiffness of left shoulder, not elsewhere classified: Secondary | ICD-10-CM | POA: Diagnosis not present

## 2017-09-25 ENCOUNTER — Encounter: Payer: Self-pay | Admitting: Pulmonary Disease

## 2017-09-25 ENCOUNTER — Ambulatory Visit (INDEPENDENT_AMBULATORY_CARE_PROVIDER_SITE_OTHER)
Admission: RE | Admit: 2017-09-25 | Discharge: 2017-09-25 | Disposition: A | Discharge status: Home / Self Care | Payer: Medicare Other | Source: Ambulatory Visit | Attending: Pulmonary Disease | Admitting: Pulmonary Disease

## 2017-09-25 ENCOUNTER — Ambulatory Visit (INDEPENDENT_AMBULATORY_CARE_PROVIDER_SITE_OTHER): Payer: Medicare Other | Admitting: Pulmonary Disease

## 2017-09-25 VITALS — BP 124/88 | HR 74 | Ht <= 58 in | Wt 141.0 lb

## 2017-09-25 DIAGNOSIS — R0683 Snoring: Secondary | ICD-10-CM

## 2017-09-25 DIAGNOSIS — R0602 Shortness of breath: Secondary | ICD-10-CM

## 2017-09-25 NOTE — Progress Notes (Signed)
Caitlin [name]    [id number]    19-Mar-1945  Primary Care Physician:Panosh, Standley Brooking, MD  Referring Physician: Burnis Medin, MD [place], [place] [id number]  Chief complaint:  History of obstructive sleep apnea Significant daytime sleepiness  HPI:  Patient with a known history of obstructive sleep apnea During recent surgery, noted to have significant desaturations, concern for sleep disordered breathing with significant Known snorer Had a sleep study done a few years back that was positive for sleep disordered breathing, no treatment was offered at the time Usually goes to bed about 1 AM, gets up about 10:30 AM or later 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 Weight has been about the same Has been having more issues recently secondary to having a lot of pain and discomfort from a recent shoulder surgery Sleep has not been significantly consolidated recently   Outpatient Encounter Medications as of 09/25/2017  Medication Sig  . baclofen (LIORESAL) 10 MG tablet Take 10 mg by mouth 3 (three) times daily.  . calcium carbonate (TUMS EX) 750 MG chewable tablet Chew 1 tablet by mouth as needed for heartburn.  . cholecalciferol (VITAMIN D) 1000 UNITS tablet Take 2,000 Units by mouth daily.   [name] dicyclomine (BENTYL) 10 MG capsule Take 1 capsule (10 mg total) by mouth every 8 (eight) hours as needed for spasms.  [name] escitalopram (LEXAPRO) 10 MG tablet TAKE 1 TABLET BY MOUTH ONCE DAILY  . esomeprazole (NEXIUM) 20 MG capsule Take 20 mg by mouth daily at 12 noon. Reported on 03/08/2015  . FREESTYLE LITE test strip USE TO CHECK BLOOD SUGAR DAILY AND AS NEEDED  . hydrochlorothiazide (HYDRODIURIL) 12.5 MG tablet TAKE 1 TABLET BY MOUTH ONCE DAILY  . hydrochlorothiazide (MICROZIDE) 12.5 MG capsule Take 1 capsule (12.5 mg total) by mouth daily.  . Lancets (FREESTYLE) lancets USE TO CHECK BLOOD SUGAR DAILY AND AS NEEDED  . ondansetron (ZOFRAN-ODT) 4 MG disintegrating tablet  Take 1 tablet by mouth every 4 (four) hours.  . potassium chloride (K-DUR,KLOR-CON) 10 MEQ tablet TAKE 1 TABLET BY MOUTH EVERY OTHER DAY WITH FLUID TABLET.   No facility-administered encounter medications on file as of 09/25/2017.     Allergies as of 09/25/2017 - Review Complete 09/25/2017  Allergen Reaction Noted  . Mobic [meloxicam] Diarrhea 11/28/2011  . Aleve [naproxen]  05/15/2016  . Codeine phosphate Other (See Comments) 03/15/2006  . Colestipol hcl  05/24/2017  . Omeprazole-sodium bicarbonate  12/23/2008  . Protonix [pantoprazole sodium] Diarrhea 06/05/2012  . Sulfamethoxazole Diarrhea and Nausea And Vomiting 03/15/2006    Past Medical History:  Diagnosis Date  . Allergy   . Anxiety   . Arthritis   . Blood transfusion without reported diagnosis   . Cataract    bil eyes  . Depression   . Diabetes mellitus without complication (HCC)    pre-diabetic, no meds, diet controlled  . Diverticulosis of colon   . Enteritis   . Erythema nodosum    Tends to be seasonal has had a negative chest x-ray in the past negative PPD  . Fibromyalgia   . GERD (gastroesophageal reflux disease)    History of stricture and dilatation and Nissen surgery  . Hypertension   . IBS (irritable bowel syndrome)   . Meniere's disease    ? dx per dr Marcelline Mates  . Mood disorder ([place])   . Neuromuscular disorder ([place])    fibromylagia  . Osteopenia   . Sleep apnea  [facility].  pt does not wear a c-pap.    Past Surgical History:  Procedure Laterality Date  . ABDOMINAL HYSTERECTOMY    . bone spur    . CESAREAN SECTION     x 1  . CHOLECYSTECTOMY    . COLONOSCOPY    . ESOPHAGOGASTRODUODENOSCOPY     stricture and dilatation  . left shoulder surgery  06/08, 07/12/17   x 2  . NISSEN FUNDOPLICATION    . ROTATOR CUFF REPAIR Left   . rt knee surgery     scope  . UPPER GASTROINTESTINAL ENDOSCOPY      Family History  Problem Relation Age of Onset  . Depression Mother   . Kidney cancer  Mother   . Depression Father   . Hiatal hernia Father   . COPD Sister        Deceased 12-18-09  . Nephrolithiasis Unknown   . Dementia Brother   . Colon cancer Neg Hx   . Esophageal cancer Neg Hx   . Pancreatic cancer Neg Hx   . Rectal cancer Neg Hx   . Stomach cancer Neg Hx     Social History   Socioeconomic History  . Marital status: Married    Spouse name: Not on file  . Number of children: 1  . Years of education: Not on file  . Highest education level: Not on file  Occupational History  . Occupation: Retired  Scientific laboratory technician  . Financial resource strain: Not on file  . Food insecurity:    Worry: Not on file    Inability: Not on file  . Transportation needs:    Medical: Not on file    Non-medical: Not on file  Tobacco Use  . Smoking status: Never Smoker  . Smokeless tobacco: Never Used  Substance and Sexual Activity  . Alcohol use: No    Alcohol/week: 0.0 standard drinks  . Drug use: No  . Sexual activity: Not on file  Lifestyle  . Physical activity:    Days per week: Not on file    Minutes per session: Not on file  . Stress: Not on file  Relationships  . Social connections:    Talks on phone: Not on file    Gets together: Not on file    Attends religious service: Not on file    Active member of club or organization: Not on file    Attends meetings of clubs or organizations: Not on file    Relationship status: Not on file  . Intimate partner violence:    Fear of current or ex partner: Not on file    Emotionally abused: Not on file    Physically abused: Not on file    Forced sexual activity: Not on file  Other Topics Concern  . Not on file  Social History Narrative   Unemployed   Married   [place] of 2    Has grandchildren out of state   No pets   Sis. died 12-18-09 COPD    Review of Systems  HENT: Negative.   Respiratory: Positive for apnea and shortness of breath.   Cardiovascular: Negative.   Gastrointestinal: Negative.     Psychiatric/Behavioral: Positive for sleep disturbance.  All other systems reviewed and are negative.   Vitals:   09/25/17 1505 09/25/17 1506  BP: 124/88   Pulse: 74   SpO2:  96%     Physical Exam  Constitutional: She is oriented to person, place, and time. She appears  well-developed and well-nourished.  HENT:  Head: Normocephalic and atraumatic.  Eyes: Pupils are equal, round, and reactive to light. Conjunctivae and EOM are normal. Right eye exhibits no discharge. Left eye exhibits no discharge.  Neck: Normal range of motion. Neck supple. No thyromegaly present.  Cardiovascular: Normal rate.  Pulmonary/Chest: Effort normal and breath sounds normal.  Decreased air movement bilaterally  Abdominal: Soft. Bowel sounds are normal. There is no tenderness.  Musculoskeletal: Normal range of motion. She exhibits no edema.  Neurological: She is alert and oriented to person, place, and time. No cranial nerve deficit. Coordination normal.  Skin: Skin is warm. No erythema.  Psychiatric: She has a normal mood and affect.   Ess of 6  Assessment:   High probability of significant sleep disordered breathing  Nonrestorative sleep Excessive daytime sleepiness   Plan/Recommendations:  Home sleep study Pathophysiology of sleep disordered breathing discussed with patient Options of treatment discussed with patient  We will obtain a chest x-ray for shortness of breath  She does have significant hypoventilation, PFT will be considered following improvement with her shoulder discomfort  I will see her in 6 to 8 weeks following initiation of treatment for sleep disordered breathing   Sherrilyn Rist MD [facility] 09/25/2017, 3:42 PM  CC: Panosh, Standley Brooking, MD

## 2017-09-25 NOTE — Patient Instructions (Addendum)
High probability of significant sleep disordered breathing  Order home sleep study for the end of the month of September  I will see you back in the office 6 to 8 weeks after initiation treatment for sleep disordered breathing

## 2017-10-01 DIAGNOSIS — M25612 Stiffness of left shoulder, not elsewhere classified: Secondary | ICD-10-CM | POA: Diagnosis not present

## 2017-10-01 DIAGNOSIS — M6281 Muscle weakness (generalized): Secondary | ICD-10-CM | POA: Diagnosis not present

## 2017-10-01 DIAGNOSIS — S42202D Unspecified fracture of upper end of left humerus, subsequent encounter for fracture with routine healing: Secondary | ICD-10-CM | POA: Diagnosis not present

## 2017-10-01 DIAGNOSIS — M25512 Pain in left shoulder: Secondary | ICD-10-CM | POA: Diagnosis not present

## 2017-10-08 ENCOUNTER — Ambulatory Visit
Admission: RE | Admit: 2017-10-08 | Discharge: 2017-10-08 | Disposition: A | Discharge status: Home / Self Care | Payer: Medicare Other | Source: Ambulatory Visit | Attending: Internal Medicine | Admitting: Internal Medicine

## 2017-10-08 DIAGNOSIS — Z1231 Encounter for screening mammogram for malignant neoplasm of breast: Secondary | ICD-10-CM

## 2017-10-10 DIAGNOSIS — M25512 Pain in left shoulder: Secondary | ICD-10-CM | POA: Diagnosis not present

## 2017-10-11 ENCOUNTER — Ambulatory Visit: Payer: Medicare Other

## 2017-10-12 ENCOUNTER — Ambulatory Visit (INDEPENDENT_AMBULATORY_CARE_PROVIDER_SITE_OTHER): Payer: Medicare Other

## 2017-10-12 DIAGNOSIS — Z23 Encounter for immunization: Secondary | ICD-10-CM | POA: Diagnosis not present

## 2017-10-15 ENCOUNTER — Telehealth: Payer: Self-pay | Admitting: Pulmonary Disease

## 2017-10-15 DIAGNOSIS — M25512 Pain in left shoulder: Secondary | ICD-10-CM | POA: Diagnosis not present

## 2017-10-15 DIAGNOSIS — M6281 Muscle weakness (generalized): Secondary | ICD-10-CM | POA: Diagnosis not present

## 2017-10-15 DIAGNOSIS — S42202D Unspecified fracture of upper end of left humerus, subsequent encounter for fracture with routine healing: Secondary | ICD-10-CM | POA: Diagnosis not present

## 2017-10-15 DIAGNOSIS — M25612 Stiffness of left shoulder, not elsewhere classified: Secondary | ICD-10-CM | POA: Diagnosis not present

## 2017-10-15 NOTE — Telephone Encounter (Signed)
Spoke with pt, she states she has not received a call about picking up the equipment for the HST. PCC's can  You look into this? She states you can email her through mychart to let her know when her appt is. Thank you!

## 2017-10-16 NOTE — Telephone Encounter (Signed)
I tried calling Mrs. Vaughn about scheduling the HST and I had to leave a message for her to return my call

## 2017-10-17 NOTE — Telephone Encounter (Signed)
Called and spoke to patient, patient states this is a good contact number if she cannot be reached at home. [phone]. Will send information to [facility] for f/u. Noting further needed at this time.

## 2017-10-22 DIAGNOSIS — M6281 Muscle weakness (generalized): Secondary | ICD-10-CM | POA: Diagnosis not present

## 2017-10-22 DIAGNOSIS — M25612 Stiffness of left shoulder, not elsewhere classified: Secondary | ICD-10-CM | POA: Diagnosis not present

## 2017-10-22 DIAGNOSIS — M25512 Pain in left shoulder: Secondary | ICD-10-CM | POA: Diagnosis not present

## 2017-10-22 DIAGNOSIS — S42202D Unspecified fracture of upper end of left humerus, subsequent encounter for fracture with routine healing: Secondary | ICD-10-CM | POA: Diagnosis not present

## 2017-10-22 NOTE — Progress Notes (Signed)
Subjective:   Caitlin [name] is a 72 y.o. female who presents for Medicare Annual (Subsequent) preventive examination.  Reports health as fair  Recently lost her brother;  He was EtOH  One brother left - wife has leukemia  Has spouse One level   Diet BMI 20 Chol/hdl 2 Breakfast doughnut  Lunch piece of chicken; hamburger Supper go out to eat  Mostly go to grilled chicken; potato     BMI-28   Exercise- does not exercise Walks a lot; stays on her feet  Is in PT  Can't seem to get out of the house as much due to IBS   Eye exam 10/2015  There are no preventive care reminders to display for this patient.  Colonoscopy 09/2016 (cologuard 2018)   Mammogram 09/2017  Dexa 03/2001 -2.3  (was going to consider repeating last year)  Has been seeing [facility] MD's as well  Would recommend a dexa scan since she has not had one in awhile  Will order dexa due to fall in June  Had one dose of shingrix in October         Objective:     Vitals: BP (!) 142/72   Pulse 67   Ht 4' 9.5" (1.461 m)   Wt 141 lb 8 oz (64.2 kg)   SpO2 95%   BMI 30.09 kg/m   Body mass index is 30.09 kg/m.  Advanced Directives 10/19/2016 04/28/2016  Does Patient Have a Medical Advance Directive? Yes Yes  Type of Advance Directive - [place];Living will    Tobacco Social History   Tobacco Use  Smoking Status Never Smoker  Smokeless Tobacco Never Used     Counseling given: Yes   Clinical Intake:      Past Medical History:  Diagnosis Date  . Allergy   . Anxiety   . Arthritis   . Blood transfusion without reported diagnosis   . Cataract    bil eyes  . Depression   . Diabetes mellitus without complication (HCC)    pre-diabetic, no meds, diet controlled  . Diverticulosis of colon   . Enteritis   . Erythema nodosum    Tends to be seasonal has had a negative chest x-ray in the past negative PPD  . Fibromyalgia   . GERD (gastroesophageal reflux disease)    History of  stricture and dilatation and Nissen surgery  . Hypertension   . IBS (irritable bowel syndrome)   . Meniere's disease    ? dx per dr Marcelline Mates  . Mood disorder ([place])   . Neuromuscular disorder ([place])    fibromylagia  . Osteopenia   . Sleep apnea    [facility].  pt does not wear a c-pap.   Past Surgical History:  Procedure Laterality Date  . ABDOMINAL HYSTERECTOMY    . bone spur    . CESAREAN SECTION     x 1  . CHOLECYSTECTOMY    . COLONOSCOPY    . ESOPHAGOGASTRODUODENOSCOPY     stricture and dilatation  . left shoulder surgery  06/08, 07/12/17   x 2  . NISSEN FUNDOPLICATION    . ROTATOR CUFF REPAIR Left   . rt knee surgery     scope  . UPPER GASTROINTESTINAL ENDOSCOPY     Family History  Problem Relation Age of Onset  . Depression Mother   . Kidney cancer Mother   . Depression Father   . Hiatal hernia Father   . COPD Sister  Deceased 12-17-09  . Nephrolithiasis Unknown   . Dementia Brother   . Colon cancer Neg Hx   . Esophageal cancer Neg Hx   . Pancreatic cancer Neg Hx   . Rectal cancer Neg Hx   . Stomach cancer Neg Hx    Social History   Socioeconomic History  . Marital status: Married    Spouse name: Not on file  . Number of children: 1  . Years of education: Not on file  . Highest education level: Not on file  Occupational History  . Occupation: Retired  Scientific laboratory technician  . Financial resource strain: Not on file  . Food insecurity:    Worry: Not on file    Inability: Not on file  . Transportation needs:    Medical: Not on file    Non-medical: Not on file  Tobacco Use  . Smoking status: Never Smoker  . Smokeless tobacco: Never Used  Substance and Sexual Activity  . Alcohol use: No    Alcohol/week: 0.0 standard drinks  . Drug use: No  . Sexual activity: Not on file  Lifestyle  . Physical activity:    Days per week: Not on file    Minutes per session: Not on file  . Stress: Not on file  Relationships  . Social connections:     Talks on phone: Not on file    Gets together: Not on file    Attends religious service: Not on file    Active member of club or organization: Not on file    Attends meetings of clubs or organizations: Not on file    Relationship status: Not on file  Other Topics Concern  . Not on file  Social History Narrative   Unemployed   Married   [place] of 2    Has grandchildren out of state   No pets   Sis. died 17-Dec-2009 COPD    Outpatient Encounter Medications as of 10/23/2017  Medication Sig  . baclofen (LIORESAL) 10 MG tablet Take 10 mg by mouth 3 (three) times daily.  . calcium carbonate (TUMS EX) 750 MG chewable tablet Chew 1 tablet by mouth as needed for heartburn.  . cholecalciferol (VITAMIN D) 1000 UNITS tablet Take 2,000 Units by mouth daily.   [name] dicyclomine (BENTYL) 10 MG capsule Take 1 capsule (10 mg total) by mouth every 8 (eight) hours as needed for spasms.  [name] escitalopram (LEXAPRO) 10 MG tablet TAKE 1 TABLET BY MOUTH ONCE DAILY  . esomeprazole (NEXIUM) 20 MG capsule Take 20 mg by mouth daily at 12 noon. Reported on 03/08/2015  . FREESTYLE LITE test strip USE TO CHECK BLOOD SUGAR DAILY AND AS NEEDED  . hydrochlorothiazide (HYDRODIURIL) 12.5 MG tablet TAKE 1 TABLET BY MOUTH ONCE DAILY  . hydrochlorothiazide (MICROZIDE) 12.5 MG capsule Take 1 capsule (12.5 mg total) by mouth daily.  . Lancets (FREESTYLE) lancets USE TO CHECK BLOOD SUGAR DAILY AND AS NEEDED  . ondansetron (ZOFRAN-ODT) 4 MG disintegrating tablet Take 1 tablet by mouth every 4 (four) hours.  . potassium chloride (K-DUR,KLOR-CON) 10 MEQ tablet TAKE 1 TABLET BY MOUTH EVERY OTHER DAY WITH FLUID TABLET.   No facility-administered encounter medications on file as of 10/23/2017.     Activities of Daily Living In your present state of health, do you have any difficulty performing the following activities: 04/17/2017  Hearing? N  Vision? N  Difficulty concentrating or making decisions? Y  Walking or climbing stairs? N    Dressing or bathing?  N  Doing errands, shopping? N  Some recent data might be hidden    Patient Care Team: Panosh, Standley Brooking, MD as PCP - General Ernesto Rutherford Maudry Mayhew, MD Lafayette Dragon, MD (Inactive) (Gastroenterology) Jarome Matin, MD as Consulting Physician (Dermatology) Monna Fam, MD as Attending Physician (Ophthalmology) Elsie Saas, MD as Attending Physician (Orthopedic Surgery) Sydnee Levans, MD as Consulting Physician (Dermatology)    Assessment:   This is a routine wellness examination for Caitlin [name].  Exercise Activities and Dietary recommendations    Goals    . Exercise 150 minutes per week (moderate activity)     Walking longer distances would assist you to go to [place] with your grandson IBS is limiting; has a fitness center in the neighborhood  Will try to go x2 days        Fall Risk Fall Risk  04/17/2017 03/12/2017 10/19/2016 03/10/2016 08/30/2015  Falls in the past year? No No No No No   Hit curb in June at [facility] in therapy PT for left shoulder - 7 pins and a plate  Dr. Percell Miller   Depression Screen PHQ 2/9 Scores 04/17/2017 10/19/2016 08/30/2015 05/05/2012  PHQ - 2 Score 6 0 2 1     Cognitive Function MMSE - Mini Mental State Exam 03/12/2017 10/19/2016 03/10/2016 03/08/2015 09/01/2014  Not completed: - (No Data) - - -  Orientation to time 5 - 5 5 5   Orientation to Place 5 - 5 5 5   Registration 3 - 3 3 3   Attention/ Calculation 3 - 4 5 5   Recall 3 - 3 3 3   Language- name 2 objects 2 - 2 2 2   Language- repeat 1 - 1 1 1   Language- follow 3 step command 3 - 3 3 3   Language- read & follow direction 1 - 1 1 1   Write a sentence 1 - 1 1 1   Copy design 1 - 1 1 1   Total score 28 - 29 30 30         Immunization History  Administered Date(s) Administered  . H1N1 03/09/2008  . Influenza Split 11/08/2010, 10/17/2011  . Influenza Whole 11/14/2006, 10/09/2007, 10/21/2008, 10/19/2009  . Influenza, High Dose Seasonal PF 10/06/2014, 10/01/2015, 10/05/2016,  10/12/2017  . Influenza,inj,Quad PF,6+ Mos 10/11/2012, 10/14/2013  . PPD Test 06/05/2012  . Pneumococcal Conjugate-13 11/05/2012  . Pneumococcal Polysaccharide-23 03/09/2008, 10/06/2014  . Tdap 10/27/2011  . Zoster 08/08/2010  . Zoster Recombinat (Shingrix) 10/17/2017    Screening Tests Health Maintenance  Topic Date Due  . MAMMOGRAM  10/09/2019  . TETANUS/TDAP  10/26/2021  . COLONOSCOPY  10/05/2026  . INFLUENZA VACCINE  Completed  . DEXA SCAN  Completed  . Hepatitis C Screening  Completed  . PNA vac Low Risk Adult  Completed        Plan:      PCP Notes   Health Maintenance Colonoscopy 09/2016 (cologuard 2018)   Mammogram 09/2017  Dexa 03/2001 -2.3  (was going to consider repeating last year)  Has been seeing [facility] MD's as well  Would recommend a dexa scan since she has not had one in awhile  Will order dexa due to fall in June but states she will check to be sure her orthopedic did not do on after her fall   Had one dose of shingrix in October  Educated to take the 2nd prior to April    Abnormal Screens  Depression scale - Lexapro 10 now  Phq 21 calculated; affect is pleasant but flat Brother  just died of ETOH abuse  Did mention playing game where she lives yesterday and is making her apts, so depression scale may be falsely elevated. Did agreed to apt with Dr. Regis Bill when she gets back as well as fup with [facility] or other counselors of choice. comorbidity getting her down, IBS is problematic  Dtr is a Social worker and will talk to her    Referrals  none  Patient concerns; Can't tolerate milk products anymore  Overall IBS is getting worse  Given a list of meds to review   Nurse Concerns; As noted   Next PCP apt To be scheduled when she leaves today     I have personally reviewed and noted the following in the patient's chart:   . Medical and social history . Use of alcohol, tobacco or illicit drugs  . Current medications and  supplements . Functional ability and status . Nutritional status . Physical activity . Advanced directives . List of other physicians . Hospitalizations, surgeries, and ER visits in previous 12 months . Vitals . Screenings to include cognitive, depression, and falls . Referrals and appointments  In addition, I have reviewed and discussed with patient certain preventive protocols, quality metrics, and best practice recommendations. A written personalized care plan for preventive services as well as general preventive health recommendations were provided to patient.     Wynetta Fines, RN  10/23/2017

## 2017-10-23 ENCOUNTER — Ambulatory Visit (INDEPENDENT_AMBULATORY_CARE_PROVIDER_SITE_OTHER): Payer: Medicare Other

## 2017-10-23 VITALS — BP 142/72 | HR 67 | Ht <= 58 in | Wt 141.5 lb

## 2017-10-23 DIAGNOSIS — Z0000 Encounter for general adult medical examination without abnormal findings: Secondary | ICD-10-CM | POA: Diagnosis not present

## 2017-10-23 DIAGNOSIS — Z8781 Personal history of (healed) traumatic fracture: Secondary | ICD-10-CM | POA: Diagnosis not present

## 2017-10-23 NOTE — Patient Instructions (Addendum)
Caitlin [name] , Thank you for taking time to come for your Medicare Wellness Visit. I appreciate your ongoing commitment to your health goals. Please review the following plan we discussed and let me know if I can assist you in the future.   Since you have not had a bone density since fx and will order one  Will make an apt with Counselor of your choice.,AND MAKE AN APT WITH DR. [facility]  Phone: [phone] Devra Dopp You can discuss with your dtr   Since you feel your hearing is not as good, you can have hearing check   Shingrix is a vaccine for the prevention of Shingles in Adults 27 and older.  If you are on Medicare, the shingrix is covered under your Part D plan, so you will take both of the vaccines in the series at your pharmacy. Please check with your benefits regarding applicable copays or out of pocket expenses.  The Shingrix is given in 2 vaccines approx 8 weeks apart. You must receive the 2nd dose prior to 6 months from receipt of the first. Please have the pharmacist print out you Immunization  dates for our office records  Had one dose of shingrix at [facility] Will take the 2nd within 6 months   Try various herbs in your vegetables;  Fennel bulb taste good in green beans  May review the following meds for IBS   Alosetron (Lotronex). Alosetron is designed to relax the colon and slow the movement of waste through the lower bowel. ... Eluxadoline (Viberzi). ... Rifaximin (Xifaxan). ... Lubiprostone (Amitiza). ... Linaclotide (Linzess).   These are the goals we discussed: Goals    . Exercise 150 minutes per week (moderate activity)     Walking longer distances would assist you to go to [place] with your grandson IBS is limiting; has a fitness center in the neighborhood  Will try to go x2 days        This is a list of the screening recommended for you and due dates:  Health Maintenance  Topic Date Due  . Mammogram  10/09/2019   . Tetanus Vaccine  10/26/2021  . Colon Cancer Screening  10/05/2026  . Flu Shot  Completed  . DEXA scan (bone density measurement)  Completed  .  Hepatitis C: One time screening is recommended by Center for Disease Control  (CDC) for  adults born from [birth year] through 1965.   Completed  . Pneumonia vaccines  Completed      Fall Prevention in the Home Falls can cause injuries. They can happen to people of all ages. There are many things you can do to make your home safe and to help prevent falls. What can I do on the outside of my home?  Regularly fix the edges of walkways and driveways and fix any cracks.  Remove anything that might make you trip as you walk through a door, such as a raised step or threshold.  Trim any bushes or trees on the path to your home.  Use bright outdoor lighting.  Clear any walking paths of anything that might make someone trip, such as rocks or tools.  Regularly check to see if handrails are loose or broken. Make sure that both sides of any steps have handrails.  Any raised decks and porches should have guardrails on the edges.  Have any leaves, snow, or ice cleared regularly.  Use sand or salt on walking paths during winter.  Clean up any spills in your garage right away. This includes oil or grease spills. What can I do in the bathroom?  Use night lights.  Install grab bars by the toilet and in the tub and shower. Do not use towel bars as grab bars.  Use non-skid mats or decals in the tub or shower.  If you need to sit down in the shower, use a plastic, non-slip stool.  Keep the floor dry. Clean up any water that spills on the floor as soon as it happens.  Remove soap buildup in the tub or shower regularly.  Attach bath mats securely with double-sided non-slip rug tape.  Do not have throw rugs and other things on the floor that can make you trip. What can I do in the bedroom?  Use night lights.  Make sure that you have a light by your  bed that is easy to reach.  Do not use any sheets or blankets that are too big for your bed. They should not hang down onto the floor.  Have a firm chair that has side arms. You can use this for support while you get dressed.  Do not have throw rugs and other things on the floor that can make you trip. What can I do in the kitchen?  Clean up any spills right away.  Avoid walking on wet floors.  Keep items that you use a lot in easy-to-reach places.  If you need to reach something above you, use a strong step stool that has a grab bar.  Keep electrical cords out of the way.  Do not use floor polish or wax that makes floors slippery. If you must use wax, use non-skid floor wax.  Do not have throw rugs and other things on the floor that can make you trip. What can I do with my stairs?  Do not leave any items on the stairs.  Make sure that there are handrails on both sides of the stairs and use them. Fix handrails that are broken or loose. Make sure that handrails are as long as the stairways.  Check any carpeting to make sure that it is firmly attached to the stairs. Fix any carpet that is loose or worn.  Avoid having throw rugs at the top or bottom of the stairs. If you do have throw rugs, attach them to the floor with carpet tape.  Make sure that you have a light switch at the top of the stairs and the bottom of the stairs. If you do not have them, ask someone to add them for you. What else can I do to help prevent falls?  Wear shoes that: ? Do not have high heels. ? Have rubber bottoms. ? Are comfortable and fit you well. ? Are closed at the toe. Do not wear sandals.  If you use a stepladder: ? Make sure that it is fully opened. Do not climb a closed stepladder. ? Make sure that both sides of the stepladder are locked into place. ? Ask someone to hold it for you, if possible.  Clearly mark and make sure that you can see: ? Any grab bars or handrails. ? First and last  steps. ? Where the edge of each step is.  Use tools that help you move around (mobility aids) if they are needed. These include: ? Canes. ? Walkers. ? Scooters. ? Crutches.  Turn on the lights when you go into a dark area. Replace any light bulbs as soon  as they burn out.  Set up your furniture so you have a clear path. Avoid moving your furniture around.  If any of your floors are uneven, fix them.  If there are any pets around you, be aware of where they are.  Review your medicines with your doctor. Some medicines can make you feel dizzy. This can increase your chance of falling. Ask your doctor what other things that you can do to help prevent falls. This information is not intended to replace advice given to you by your health care provider. Make sure you discuss any questions you have with your health care provider. Document Released: 10/29/2008 Document Revised: 06/10/2015 Document Reviewed: 02/06/2014 Elsevier Interactive Patient Education  2018 [place] Maintenance, Female Adopting a healthy lifestyle and getting preventive care can go a long way to promote health and wellness. Talk with your health care provider about what schedule of regular examinations is right for you. This is a good chance for you to check in with your provider about disease prevention and staying healthy. In between checkups, there are plenty of things you can do on your own. Experts have done a lot of research about which lifestyle changes and preventive measures are most likely to keep you healthy. Ask your health care provider for more information. Weight and diet Eat a healthy diet  Be sure to include plenty of vegetables, fruits, low-fat dairy products, and lean protein.  Do not eat a lot of foods high in solid fats, added sugars, or salt.  Get regular exercise. This is one of the most important things you can do for your health. ? Most adults should exercise for at least 150  minutes each week. The exercise should increase your heart rate and make you sweat (moderate-intensity exercise). ? Most adults should also do strengthening exercises at least twice a week. This is in addition to the moderate-intensity exercise.  Maintain a healthy weight  Body mass index (BMI) is a measurement that can be used to identify possible weight problems. It estimates body fat based on height and weight. Your health care provider can help determine your BMI and help you achieve or maintain a healthy weight.  For females 30 years of age and older: ? A BMI below 18.5 is considered underweight. ? A BMI of 18.5 to 24.9 is normal. ? A BMI of 25 to 29.9 is considered overweight. ? A BMI of 30 and above is considered obese.  Watch levels of cholesterol and blood lipids  You should start having your blood tested for lipids and cholesterol at 71 years of age, then have this test every 5 years.  You may need to have your cholesterol levels checked more often if: ? Your lipid or cholesterol levels are high. ? You are older than 71 years of age. ? You are at high risk for heart disease.  Cancer screening Lung Cancer  Lung cancer screening is recommended for adults 66-9 years old who are at high risk for lung cancer because of a history of smoking.  A yearly low-dose CT scan of the lungs is recommended for people who: ? Currently smoke. ? Have quit within the past 15 years. ? Have at least a 30-pack-year history of smoking. A pack year is smoking an average of one pack of cigarettes a day for 1 year.  Yearly screening should continue until it has been 15 years since you quit.  Yearly screening should stop if you develop a health problem  that would prevent you from having lung cancer treatment.  Breast Cancer  Practice breast self-awareness. This means understanding how your breasts normally appear and feel.  It also means doing regular breast self-exams. Let your health care  provider know about any changes, no matter how small.  If you are in your 20s or 30s, you should have a clinical breast exam (CBE) by a health care provider every 1-3 years as part of a regular health exam.  If you are 41 or older, have a CBE every year. Also consider having a breast X-ray (mammogram) every year.  If you have a family history of breast cancer, talk to your health care provider about genetic screening.  If you are at high risk for breast cancer, talk to your health care provider about having an MRI and a mammogram every year.  Breast cancer gene (BRCA) assessment is recommended for women who have family members with BRCA-related cancers. BRCA-related cancers include: ? Breast. ? Ovarian. ? Tubal. ? Peritoneal cancers.  Results of the assessment will determine the need for genetic counseling and BRCA1 and BRCA2 testing.  Cervical Cancer Your health care provider may recommend that you be screened regularly for cancer of the pelvic organs (ovaries, uterus, and vagina). This screening involves a pelvic examination, including checking for microscopic changes to the surface of your cervix (Pap test). You may be encouraged to have this screening done every 3 years, beginning at age 57.  For women ages 55-65, health care providers may recommend pelvic exams and Pap testing every 3 years, or they may recommend the Pap and pelvic exam, combined with testing for human papilloma virus (HPV), every 5 years. Some types of HPV increase your risk of cervical cancer. Testing for HPV may also be done on women of any age with unclear Pap test results.  Other health care providers may not recommend any screening for nonpregnant women who are considered low risk for pelvic cancer and who do not have symptoms. Ask your health care provider if a screening pelvic exam is right for you.  If you have had past treatment for cervical cancer or a condition that could lead to cancer, you need Pap tests  and screening for cancer for at least 20 years after your treatment. If Pap tests have been discontinued, your risk factors (such as having a new sexual partner) need to be reassessed to determine if screening should resume. Some women have medical problems that increase the chance of getting cervical cancer. In these cases, your health care provider may recommend more frequent screening and Pap tests.  Colorectal Cancer  This type of cancer can be detected and often prevented.  Routine colorectal cancer screening usually begins at 71 years of age and continues through 71 years of age.  Your health care provider may recommend screening at an earlier age if you have risk factors for colon cancer.  Your health care provider may also recommend using home test kits to check for hidden blood in the stool.  A small camera at the end of a tube can be used to examine your colon directly (sigmoidoscopy or colonoscopy). This is done to check for the earliest forms of colorectal cancer.  Routine screening usually begins at age 77.  Direct examination of the colon should be repeated every 5-10 years through 71 years of age. However, you may need to be screened more often if early forms of precancerous polyps or small growths are found.  Skin Cancer  Check your skin from head to toe regularly.  Tell your health care provider about any new moles or changes in moles, especially if there is a change in a mole's shape or color.  Also tell your health care provider if you have a mole that is larger than the size of a pencil eraser.  Always use sunscreen. Apply sunscreen liberally and repeatedly throughout the day.  Protect yourself by wearing long sleeves, pants, a wide-brimmed hat, and sunglasses whenever you are outside.  Heart disease, diabetes, and high blood pressure  High blood pressure causes heart disease and increases the risk of stroke. High blood pressure is more likely to develop  in: ? People who have blood pressure in the high end of the normal range (130-139/85-89 mm Hg). ? People who are overweight or obese. ? People who are African American.  If you are 50-104 years of age, have your blood pressure checked every 3-5 years. If you are 44 years of age or older, have your blood pressure checked every year. You should have your blood pressure measured twice-once when you are at a hospital or clinic, and once when you are not at a hospital or clinic. Record the average of the two measurements. To check your blood pressure when you are not at a hospital or clinic, you can use: ? An automated blood pressure machine at a pharmacy. ? A home blood pressure monitor.  If you are between 2 years and 21 years old, ask your health care provider if you should take aspirin to prevent strokes.  Have regular diabetes screenings. This involves taking a blood sample to check your fasting blood sugar level. ? If you are at a normal weight and have a low risk for diabetes, have this test once every three years after 71 years of age. ? If you are overweight and have a high risk for diabetes, consider being tested at a younger age or more often. Preventing infection Hepatitis B  If you have a higher risk for hepatitis B, you should be screened for this virus. You are considered at high risk for hepatitis B if: ? You were born in a country where hepatitis B is common. Ask your health care provider which countries are considered high risk. ? Your parents were born in a high-risk country, and you have not been immunized against hepatitis B (hepatitis B vaccine). ? You have HIV or AIDS. ? You use needles to inject street drugs. ? You live with someone who has hepatitis B. ? You have had sex with someone who has hepatitis B. ? You get hemodialysis treatment. ? You take certain medicines for conditions, including cancer, organ transplantation, and autoimmune conditions.  Hepatitis C  Blood  testing is recommended for: ? Everyone born from [birth year] through 1964. ? Anyone with known risk factors for hepatitis C.  Sexually transmitted infections (STIs)  You should be screened for sexually transmitted infections (STIs) including gonorrhea and chlamydia if: ? You are sexually active and are younger than 71 years of age. ? You are older than 71 years of age and your health care provider tells you that you are at risk for this type of infection. ? Your sexual activity has changed since you were last screened and you are at an increased risk for chlamydia or gonorrhea. Ask your health care provider if you are at risk.  If you do not have HIV, but are at risk, it may be recommended that you take a prescription  medicine daily to prevent HIV infection. This is called pre-exposure prophylaxis (PrEP). You are considered at risk if: ? You are sexually active and do not regularly use condoms or know the HIV status of your partner(s). ? You take drugs by injection. ? You are sexually active with a partner who has HIV.  Talk with your health care provider about whether you are at high risk of being infected with HIV. If you choose to begin PrEP, you should first be tested for HIV. You should then be tested every 3 months for as long as you are taking PrEP. Pregnancy  If you are premenopausal and you may become pregnant, ask your health care provider about preconception counseling.  If you may become pregnant, take 400 to 800 micrograms (mcg) of folic acid every day.  If you want to prevent pregnancy, talk to your health care provider about birth control (contraception). Osteoporosis and menopause  Osteoporosis is a disease in which the bones lose minerals and strength with aging. This can result in serious bone fractures. Your risk for osteoporosis can be identified using a bone density scan.  If you are 18 years of age or older, or if you are at risk for osteoporosis and fractures, ask your  health care provider if you should be screened.  Ask your health care provider whether you should take a calcium or vitamin D supplement to lower your risk for osteoporosis.  Menopause may have certain physical symptoms and risks.  Hormone replacement therapy may reduce some of these symptoms and risks. Talk to your health care provider about whether hormone replacement therapy is right for you. Follow these instructions at home:  Schedule regular health, dental, and eye exams.  Stay current with your immunizations.  Do not use any tobacco products including cigarettes, chewing tobacco, or electronic cigarettes.  If you are pregnant, do not drink alcohol.  If you are breastfeeding, limit how much and how often you drink alcohol.  Limit alcohol intake to no more than 1 drink per day for nonpregnant women. One drink equals 12 ounces of beer, 5 ounces of wine, or 1 ounces of hard liquor.  Do not use street drugs.  Do not share needles.  Ask your health care provider for help if you need support or information about quitting drugs.  Tell your health care provider if you often feel depressed.  Tell your health care provider if you have ever been abused or do not feel safe at home. This information is not intended to replace advice given to you by your health care provider. Make sure you discuss any questions you have with your health care provider. Document Released: 07/18/2010 Document Revised: 06/10/2015 Document Reviewed: 10/06/2014 Elsevier Interactive Patient Education  [employer].

## 2017-10-23 NOTE — Progress Notes (Signed)
Agree with seeing PCP.Will route to PCP as well for FYI upcoming appt. Lucretia Kern, DO

## 2017-10-24 DIAGNOSIS — H01003 Unspecified blepharitis right eye, unspecified eyelid: Secondary | ICD-10-CM | POA: Diagnosis not present

## 2017-10-24 DIAGNOSIS — H1013 Acute atopic conjunctivitis, bilateral: Secondary | ICD-10-CM | POA: Diagnosis not present

## 2017-10-24 DIAGNOSIS — H40013 Open angle with borderline findings, low risk, bilateral: Secondary | ICD-10-CM | POA: Diagnosis not present

## 2017-10-24 DIAGNOSIS — H04123 Dry eye syndrome of bilateral lacrimal glands: Secondary | ICD-10-CM | POA: Diagnosis not present

## 2017-10-29 DIAGNOSIS — M25512 Pain in left shoulder: Secondary | ICD-10-CM | POA: Diagnosis not present

## 2017-10-29 DIAGNOSIS — S42202D Unspecified fracture of upper end of left humerus, subsequent encounter for fracture with routine healing: Secondary | ICD-10-CM | POA: Diagnosis not present

## 2017-10-29 DIAGNOSIS — M6281 Muscle weakness (generalized): Secondary | ICD-10-CM | POA: Diagnosis not present

## 2017-10-29 DIAGNOSIS — M25612 Stiffness of left shoulder, not elsewhere classified: Secondary | ICD-10-CM | POA: Diagnosis not present

## 2017-10-30 DIAGNOSIS — G4733 Obstructive sleep apnea (adult) (pediatric): Secondary | ICD-10-CM | POA: Diagnosis not present

## 2017-10-31 ENCOUNTER — Other Ambulatory Visit: Payer: Self-pay | Admitting: *Deleted

## 2017-10-31 DIAGNOSIS — G4733 Obstructive sleep apnea (adult) (pediatric): Secondary | ICD-10-CM | POA: Diagnosis not present

## 2017-10-31 DIAGNOSIS — R0683 Snoring: Secondary | ICD-10-CM

## 2017-11-02 ENCOUNTER — Telehealth: Payer: Self-pay | Admitting: Pulmonary Disease

## 2017-11-02 DIAGNOSIS — G4733 Obstructive sleep apnea (adult) (pediatric): Secondary | ICD-10-CM

## 2017-11-02 NOTE — Telephone Encounter (Signed)
Patient is aware of results and order is placed.

## 2017-11-02 NOTE — Telephone Encounter (Signed)
Dr. Ander Slade has reviewed the home sleep test this showed Moderate obstructive sleep apnea.Moderatley severe oxygen desaturation.   Recommendations  In Lab cpap titration study. Other contributors to patients hypoemia should be further evaluated.   Weight loss measures .   Advise against driving while sleepy & against medication with sedative side effects.    Close clinical follow up with  Dr. Ander Slade.

## 2017-11-05 DIAGNOSIS — M6281 Muscle weakness (generalized): Secondary | ICD-10-CM | POA: Diagnosis not present

## 2017-11-05 DIAGNOSIS — M25512 Pain in left shoulder: Secondary | ICD-10-CM | POA: Diagnosis not present

## 2017-11-05 DIAGNOSIS — S42202D Unspecified fracture of upper end of left humerus, subsequent encounter for fracture with routine healing: Secondary | ICD-10-CM | POA: Diagnosis not present

## 2017-11-05 DIAGNOSIS — M25612 Stiffness of left shoulder, not elsewhere classified: Secondary | ICD-10-CM | POA: Diagnosis not present

## 2017-11-07 ENCOUNTER — Ambulatory Visit: Payer: Medicare Other | Admitting: Pulmonary Disease

## 2017-11-09 DIAGNOSIS — M25512 Pain in left shoulder: Secondary | ICD-10-CM | POA: Diagnosis not present

## 2017-11-10 ENCOUNTER — Other Ambulatory Visit: Payer: Self-pay | Admitting: Internal Medicine

## 2017-11-12 NOTE — Telephone Encounter (Signed)
Pt calling to check on this.  States she only has one pill left.

## 2017-11-14 DIAGNOSIS — M6281 Muscle weakness (generalized): Secondary | ICD-10-CM | POA: Diagnosis not present

## 2017-11-14 DIAGNOSIS — S42202D Unspecified fracture of upper end of left humerus, subsequent encounter for fracture with routine healing: Secondary | ICD-10-CM | POA: Diagnosis not present

## 2017-11-14 DIAGNOSIS — M25612 Stiffness of left shoulder, not elsewhere classified: Secondary | ICD-10-CM | POA: Diagnosis not present

## 2017-11-14 DIAGNOSIS — M25512 Pain in left shoulder: Secondary | ICD-10-CM | POA: Diagnosis not present

## 2017-11-14 NOTE — Progress Notes (Signed)
Chief Complaint  Patient presents with  . Follow-up    Pt saw Pulm 09/25/17, had HST and now has to go have an In-Lab Sleep Study.     HPI: Caitlin [name] 72 y.o. come in for fu after sleep  initialtion and med check bp check   Hs sig desaturaions and os in sleep and to get  Further testing to titate  cpap  She feesl very tired but less depressed    Gi still hsa to take med immodium  To go out at times  Asks for 90 days and refills  When meds  Needed.  Almost rang out of lexapro  Balance still off when stands up quickly but no falling new sx .  ROS: See pertinent positives and negatives per HPI.  Past Medical History:  Diagnosis Date  . Allergy   . Anxiety   . Arthritis   . Blood transfusion without reported diagnosis   . Cataract    bil eyes  . Depression   . Diabetes mellitus without complication (HCC)    pre-diabetic, no meds, diet controlled  . Diverticulosis of colon   . Enteritis   . Erythema nodosum    Tends to be seasonal has had a negative chest x-ray in the past negative PPD  . Fibromyalgia   . GERD (gastroesophageal reflux disease)    History of stricture and dilatation and Nissen surgery  . Hypertension   . IBS (irritable bowel syndrome)   . Meniere's disease    ? dx per dr Marcelline Mates  . Mood disorder ([place])   . Neuromuscular disorder ([place])    fibromylagia  . Osteopenia   . Sleep apnea    [facility].  pt does not wear a c-pap.    Family History  Problem Relation Age of Onset  . Depression Mother   . Kidney cancer Mother   . Depression Father   . Hiatal hernia Father   . COPD Sister        Deceased 2009/12/16  . Nephrolithiasis Unknown   . Dementia Brother   . Colon cancer Neg Hx   . Esophageal cancer Neg Hx   . Pancreatic cancer Neg Hx   . Rectal cancer Neg Hx   . Stomach cancer Neg Hx     Social History   Socioeconomic History  . Marital status: Married    Spouse name: Not on file  . Number of children: 1  . Years of  education: Not on file  . Highest education level: Not on file  Occupational History  . Occupation: Retired  Scientific laboratory technician  . Financial resource strain: Not on file  . Food insecurity:    Worry: Not on file    Inability: Not on file  . Transportation needs:    Medical: Not on file    Non-medical: Not on file  Tobacco Use  . Smoking status: Never Smoker  . Smokeless tobacco: Never Used  Substance and Sexual Activity  . Alcohol use: No    Alcohol/week: 0.0 standard drinks  . Drug use: No  . Sexual activity: Not on file  Lifestyle  . Physical activity:    Days per week: Not on file    Minutes per session: Not on file  . Stress: Not on file  Relationships  . Social connections:    Talks on phone: Not on file    Gets together: Not on file    Attends religious service: Not on file  Active member of club or organization: Not on file    Attends meetings of clubs or organizations: Not on file    Relationship status: Not on file  Other Topics Concern  . Not on file  Social History Narrative   Unemployed   Married   [place] of 2    Has grandchildren out of state   No pets   Sis. died 01-02-2010 COPD    Outpatient Medications Prior to Visit  Medication Sig Dispense Refill  . calcium carbonate (TUMS EX) 750 MG chewable tablet Chew 1 tablet by mouth as needed for heartburn.    . cholecalciferol (VITAMIN D) 1000 UNITS tablet Take 2,000 Units by mouth daily.     [name] dicyclomine (BENTYL) 10 MG capsule Take 1 capsule (10 mg total) by mouth every 8 (eight) hours as needed for spasms. 90 capsule 11  . esomeprazole (NEXIUM) 20 MG capsule Take 20 mg by mouth daily at 12 noon. Reported on 03/08/2015    . FREESTYLE LITE test strip USE TO CHECK BLOOD SUGAR DAILY AND AS NEEDED 100 each 4  . hydrochlorothiazide (HYDRODIURIL) 12.5 MG tablet TAKE 1 TABLET BY MOUTH ONCE DAILY 90 tablet 1  . Lancets (FREESTYLE) lancets USE TO CHECK BLOOD SUGAR DAILY AND AS NEEDED 100 each 4  . ondansetron  (ZOFRAN-ODT) 4 MG disintegrating tablet Take 1 tablet by mouth as needed.     . potassium chloride (K-DUR,KLOR-CON) 10 MEQ tablet TAKE 1 TABLET BY MOUTH EVERY OTHER DAY WITH FLUID TABLET. 45 tablet 1  . escitalopram (LEXAPRO) 10 MG tablet TAKE 1 TABLET BY MOUTH ONCE DAILY 90 tablet 1  . baclofen (LIORESAL) 10 MG tablet Take 10 mg by mouth 3 (three) times daily.    . hydrochlorothiazide (MICROZIDE) 12.5 MG capsule Take 1 capsule (12.5 mg total) by mouth daily. (Patient not taking: Reported on 11/16/2017) 30 capsule 1   No facility-administered medications prior to visit.      EXAM:  BP 110/68 (BP Location: Right Arm, Patient Position: Sitting, Cuff Size: Normal)   Pulse 64   Temp 97.8 F (36.6 C) (Oral)   Wt 143 lb 3.2 oz (65 kg)   BMI 30.45 kg/m   Body mass index is 30.45 kg/m.  GENERAL: vitals reviewed and listed above, alert, oriented, appears well hydrated and in no acute distress brighter affect today  HEENT: atraumatic, conjunctiva  clear, no obvious abnormalities on inspection of external nose and ears NECK: no obvious masses on inspection palpation  LUNGS: clear to auscultation bilaterally, no wheezes, rales or rhonchi, good air movement CV: HRRR, no clubbing cyanosis or  peripheral edema nl cap refill  Abdomen:  Sof,t normal bowel sounds without hepatosplenomegaly, no guarding rebound or masses no CVA tenderness MS: moves all extremities without noticeable focal  abnormality PSYCH: pleasant and cooperative, no obvious depression or anxiety Lab Results  Component Value Date   WBC 7.0 04/17/2017   HGB 14.1 04/17/2017   HCT 42.4 04/17/2017   PLT 301.0 04/17/2017   GLUCOSE 93 07/31/2017   CHOL 153 04/17/2017   TRIG 92.0 04/17/2017   HDL 79.60 04/17/2017   LDLCALC 55 04/17/2017   ALT 12 04/17/2017   AST 14 04/17/2017   NA 139 07/31/2017   K 3.7 07/31/2017   CL 99 07/31/2017   CREATININE 0.85 07/31/2017   BUN 15 07/31/2017   CO2 29 07/31/2017   TSH 1.49 04/17/2017    HGBA1C 5.6 04/17/2017   MICROALBUR 1.6 04/26/2011   BP Readings from  Last 3 Encounters:  11/16/17 110/68  10/23/17 (!) 142/72  09/25/17 124/88    ASSESSMENT AND PLAN:  Discussed the following assessment and plan:  Sleep apnea, unspecified type - w ghypoxia ave 87 mod osa   Medication management  Sleep disturbance  Memory difficulties - would hope to improve with  rx for SA and hypoxia   Adjustment reaction with anxiety and depression - much improved  today  hopefully  rx osa will help her sx  Essential hypertension Hopefully  intervention can help her fatigue mood memory aches and pains and over all well being  She had resisted   Retesting when we discussed  in the past   But    Moved forward after surgery noted and current  HST   Poss osa sx .   sheis not willing to follow through.  At this time stay on the lexapro and can refill at 90 with refills for future   Change  appt to April cpx and labs at the visit  earluier if needed   Patient advised to return or notify health care team  if  new concerns arise.  Patient Instructions  I think  Many of your  Problems can improve if sleep apnea is treated  Including fatigue and achiness and memory and mood issues.   Change  appt o April CPX yearly and we can do lab at that visit .         Standley Brooking. Audray Rumore M.D.

## 2017-11-16 ENCOUNTER — Encounter: Payer: Self-pay | Admitting: Internal Medicine

## 2017-11-16 ENCOUNTER — Ambulatory Visit (INDEPENDENT_AMBULATORY_CARE_PROVIDER_SITE_OTHER): Payer: Medicare Other | Admitting: Internal Medicine

## 2017-11-16 VITALS — BP 110/68 | HR 64 | Temp 97.8°F | Wt 143.2 lb

## 2017-11-16 DIAGNOSIS — Z79899 Other long term (current) drug therapy: Secondary | ICD-10-CM

## 2017-11-16 DIAGNOSIS — I10 Essential (primary) hypertension: Secondary | ICD-10-CM | POA: Diagnosis not present

## 2017-11-16 DIAGNOSIS — G4730 Sleep apnea, unspecified: Secondary | ICD-10-CM

## 2017-11-16 DIAGNOSIS — R413 Other amnesia: Secondary | ICD-10-CM | POA: Diagnosis not present

## 2017-11-16 DIAGNOSIS — F4323 Adjustment disorder with mixed anxiety and depressed mood: Secondary | ICD-10-CM | POA: Diagnosis not present

## 2017-11-16 DIAGNOSIS — G479 Sleep disorder, unspecified: Secondary | ICD-10-CM | POA: Diagnosis not present

## 2017-11-16 MED ORDER — ESCITALOPRAM OXALATE 10 MG PO TABS: 10.0000 mg | ORAL_TABLET | Freq: Every day | ORAL | 3 refills | Status: DC

## 2017-11-16 NOTE — Patient Instructions (Addendum)
I think  Many of your  Problems can improve if sleep apnea is treated  Including fatigue and achiness and memory and mood issues.   Change  appt o April CPX yearly and we can do lab at that visit .

## 2017-11-19 DIAGNOSIS — H35033 Hypertensive retinopathy, bilateral: Secondary | ICD-10-CM | POA: Diagnosis not present

## 2017-11-19 DIAGNOSIS — H40013 Open angle with borderline findings, low risk, bilateral: Secondary | ICD-10-CM | POA: Diagnosis not present

## 2017-11-19 DIAGNOSIS — I708 Atherosclerosis of other arteries: Secondary | ICD-10-CM | POA: Diagnosis not present

## 2017-11-19 DIAGNOSIS — H35363 Drusen (degenerative) of macula, bilateral: Secondary | ICD-10-CM | POA: Diagnosis not present

## 2017-12-03 ENCOUNTER — Other Ambulatory Visit: Payer: Self-pay | Admitting: Internal Medicine

## 2017-12-03 ENCOUNTER — Other Ambulatory Visit: Payer: Self-pay | Admitting: Gastroenterology

## 2017-12-17 ENCOUNTER — Ambulatory Visit (HOSPITAL_BASED_OUTPATIENT_CLINIC_OR_DEPARTMENT_OTHER): Payer: Medicare Other | Attending: Pulmonary Disease | Admitting: Pulmonary Disease

## 2017-12-17 VITALS — Ht 59.0 in | Wt 138.0 lb

## 2017-12-17 DIAGNOSIS — G4733 Obstructive sleep apnea (adult) (pediatric): Secondary | ICD-10-CM | POA: Diagnosis not present

## 2017-12-26 ENCOUNTER — Telehealth: Payer: Self-pay | Admitting: Pulmonary Disease

## 2017-12-26 ENCOUNTER — Other Ambulatory Visit: Payer: Self-pay | Admitting: Internal Medicine

## 2017-12-26 DIAGNOSIS — E2839 Other primary ovarian failure: Secondary | ICD-10-CM

## 2017-12-26 DIAGNOSIS — M8580 Other specified disorders of bone density and structure, unspecified site: Secondary | ICD-10-CM

## 2017-12-26 DIAGNOSIS — G4733 Obstructive sleep apnea (adult) (pediatric): Secondary | ICD-10-CM

## 2017-12-26 DIAGNOSIS — M25512 Pain in left shoulder: Secondary | ICD-10-CM | POA: Diagnosis not present

## 2017-12-26 DIAGNOSIS — Z8781 Personal history of (healed) traumatic fracture: Secondary | ICD-10-CM

## 2017-12-26 NOTE — Procedures (Signed)
POLYSOMNOGRAPHY  Last, First: Caitlin [name], Caitlin [name] MRN: [id number] Gender: Female Age (years): 37 Weight (lbs): 138 DOB: [birth year] BMI: 28 Primary Care: Burnis Medin Epworth Score: 7 Referring: Laurin Coder MD Technician: Carolin Coy Interpreting: Laurin Coder MD Study Type: BiPAP Ordered Study Type: CPAP Study date: 12/17/2017 Location: [facility] CLINICAL INFORMATION Caitlin [name] is a 72 year old Female and was referred to the sleep center for evaluation of N/A. Indications include OSA.  MEDICATIONS Patient self administered medications include: N/A. Medications administered during study include No sleep medicine administered.  SLEEP STUDY TECHNIQUE The patient underwent an attended overnight polysomnography titration to assess the effects of BIPAP therapy. The following variables were monitored: EEG(C4-A1, C3-A2, O1-A2, O2-A1), EOG, submental and leg EMG, ECG, oxyhemoglobin saturation by pulse oximetry, thoracic and abdominal respiratory effort belts, nasal/oral airflow by pressure sensor, body position sensor and snoring sensor. BIPAP pressure was titrated to eliminate apneas, hypopneas and oxygen desaturation. Hypopneas were scored per AASM definition IB (4% desaturation)  TECHNICIAN COMMENTS Comments added by Technician: PATIENT WAS ORDERED AS A CPAP TITRATION. Comments added by Scorer: N/A SLEEP ARCHITECTURE The study was initiated at 10:56:11 PM and terminated at 4:59:23 AM. Total recorded time was 363.2 minutes. EEG confirmed total sleep time was 293 minutes yielding a sleep efficiency of 80.7%%. Sleep onset after lights out was 26.9 minutes with a REM latency of N/A minutes. The patient spent 14.7%% of the night in stage N1 sleep, 84.8%% in stage N2 sleep, 0.5%% in stage N3 and 0% in REM. The Arousal Index was 29.5/hour. RESPIRATORY PARAMETERS The overall AHI was 23.8 per hour, and the RDI was 32.8 events/hour with a central apnea index of 3.5per hour. The most  appropriate setting of BiPAP was 18/14 cm H2O. At this setting, the sleep efficiency was 98 % and the patient was supine for 100%. The AHI was 13.5 events per hour, and the RDI was 13.5 events/hour (with 3.5 central events) and the arousal index was 6.7 per hour.The oxygen nadir was 91.0% during sleep.    The cumulative time under 88% oxygen saturation was 5.5 minutes  LEG MOVEMENT DATA The total leg movements were 63 with a resulting leg movement index of 12.9. Associated arousal with leg movement index was 2.5. CARDIAC DATA The underlying cardiac rhythm was most consistent with sinus rhythm. Mean heart rate during sleep was 63.5 bpm. Additional rhythm abnormalities include PVCs.   IMPRESSIONS - Moderate Obstructive Sleep apnea(OSA) Optimal pressure attained. - Electrocardiographic data showed presence of PVCs. - Mild Oxygen Desaturation - The patient snored with soft snoring volume. - No significant periodic leg movements(PLMs) during sleep.    DIAGNOSIS - Obstructive Sleep Apnea (327.23 [G47.33 ICD-10])   RECOMMENDATIONS - BiPAP therapy on 18/14 cm H2O with a X-Small size Resmed Full Face Mask AirFit F10 for Her mask and heated humidification. - Avoid alcohol, sedatives and other CNS depressants that may worsen sleep apnea and disrupt normal sleep architecture. - Sleep hygiene should be reviewed to assess factors that may improve sleep quality. - Weight management and regular exercise should be initiated or continued. - Return to Sleep Center for re-evaluation after 4 weeks of therapy  [Electronically signed] 12/26/2017 05:17 AM  Sherrilyn Rist MD NPI: [id number]

## 2017-12-26 NOTE — Telephone Encounter (Signed)
Patient aware and order has been placed nothing further needed at this time.apt made.

## 2017-12-26 NOTE — Telephone Encounter (Signed)
Sleep study result    Date of study  12/17/2017  DME referral  BiPAP therapy on 18/14 cm H2O with a X-Small size Resmed Full Face Mask AirFit F10 for Her mask and heated humidification.

## 2017-12-27 ENCOUNTER — Ambulatory Visit
Admission: RE | Admit: 2017-12-27 | Discharge: 2017-12-27 | Disposition: A | Discharge status: Home / Self Care | Payer: Medicare Other | Source: Ambulatory Visit | Attending: Internal Medicine | Admitting: Internal Medicine

## 2017-12-27 DIAGNOSIS — Z8781 Personal history of (healed) traumatic fracture: Secondary | ICD-10-CM

## 2017-12-27 DIAGNOSIS — M8589 Other specified disorders of bone density and structure, multiple sites: Secondary | ICD-10-CM | POA: Diagnosis not present

## 2017-12-28 ENCOUNTER — Telehealth: Payer: Self-pay | Admitting: Pulmonary Disease

## 2017-12-28 NOTE — Telephone Encounter (Signed)
Called and spoke with Patient's Husband, Lanae Boast.  [facility] had called them and they were making sure that it was a Bipap that was ordered, and if it worked the same as a CPAP.  Questions answered. Nothing further at this time.

## 2018-01-21 ENCOUNTER — Ambulatory Visit: Payer: Medicare Other | Admitting: Internal Medicine

## 2018-01-30 ENCOUNTER — Telehealth: Payer: Self-pay | Admitting: Pulmonary Disease

## 2018-01-30 NOTE — Telephone Encounter (Signed)
Called and spoke with patient, she stated that she has now used a 3rd CPAP mask and this one is not working either. She stated that it 'leaks" she is wanting to know if maybe the pressure is too high. AO please advise, thank you.

## 2018-02-05 NOTE — Telephone Encounter (Signed)
We can decrease pressure from 18/14 to 16/12 Follow-up in the office Obtain download after 3 to 4 weeks

## 2018-02-05 NOTE — Telephone Encounter (Signed)
lmtcb

## 2018-02-06 NOTE — Telephone Encounter (Signed)
Called and spoke with pt. I stated to pt the info stated by AO in regards to changing her setting and also stated to her that we could also get her scheduled for an OV with Dr. Ander Slade if she wanted to discuss this in person with him.  Pt stated she would like to come in and see AO in person to discuss change in setting and to also possibly discuss other tx options. An appt has been scheduled for pt with AO next Wednesday, 1/29 at 11am. I have made sure pt has our new office address. nothing further needed.

## 2018-02-06 NOTE — Telephone Encounter (Signed)
Pt is calling back [phone] or [phone]

## 2018-02-06 NOTE — Telephone Encounter (Signed)
LMTCB x2 for pt 

## 2018-02-13 ENCOUNTER — Ambulatory Visit (INDEPENDENT_AMBULATORY_CARE_PROVIDER_SITE_OTHER): Payer: Medicare Other | Admitting: Pulmonary Disease

## 2018-02-13 ENCOUNTER — Encounter: Payer: Self-pay | Admitting: Pulmonary Disease

## 2018-02-13 VITALS — BP 152/62 | HR 60 | Ht <= 58 in | Wt 145.2 lb

## 2018-02-13 DIAGNOSIS — G4733 Obstructive sleep apnea (adult) (pediatric): Secondary | ICD-10-CM

## 2018-02-13 NOTE — Progress Notes (Signed)
Caitlin [name]    [id number]    08-Sep-1945  Primary Care Physician:Panosh, Standley Brooking, MD  Referring Physician: Burnis Medin, [place], [place] [address]  Chief complaint:  History of obstructive sleep apnea Significant daytime sleepiness  HPI: Having difficulty tolerating BiPAP Feels pressure may be too high Also having mask issues-May be leaking still sleeps for many hours  Study did reveal obstructive sleep apnea titrated to BiPAP of 18/14  Patient with a known history of obstructive sleep apnea During recent surgery, noted to have significant desaturations, concern for sleep disordered breathing with significant Known snorer Had a sleep study done a few years back that was positive for sleep disordered breathing, no treatment was offered at the time Usually goes to bed about 1 AM, gets up about 10:30 AM or later 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 Weight has been about the same Has been having more issues recently secondary to having a lot of pain and discomfort from a recent shoulder surgery Sleep has not been significantly consolidated recently   Outpatient Encounter Medications as of 02/13/2018  Medication Sig  . calcium carbonate (TUMS EX) 750 MG chewable tablet Chew 1 tablet by mouth as needed for heartburn.  . cholecalciferol (VITAMIN D) 1000 UNITS tablet Take 2,000 Units by mouth daily.   [name] dicyclomine (BENTYL) 10 MG capsule TAKE 1 CAPSULE BY MOUTH EVERY 8 HOURS AS NEEDED FOR SPASMS  . escitalopram (LEXAPRO) 10 MG tablet Take 1 tablet (10 mg total) by mouth daily.  [name] esomeprazole (NEXIUM) 20 MG capsule Take 20 mg by mouth daily at 12 noon. Reported on 03/08/2015  . FREESTYLE LITE test strip USE TO CHECK BLOOD SUGAR DAILY AND AS NEEDED  . hydrochlorothiazide (HYDRODIURIL) 12.5 MG tablet TAKE 1 TABLET BY MOUTH ONCE DAILY  . Lancets (FREESTYLE) lancets USE TO CHECK BLOOD SUGAR DAILY AND AS NEEDED  . ondansetron (ZOFRAN-ODT) 4 MG  disintegrating tablet Take 1 tablet by mouth as needed.   . potassium chloride (K-DUR,KLOR-CON) 10 MEQ tablet TAKE 1 TABLET BY MOUTH EVERY OTHER DAY WITH  FLUID  TABLET   No facility-administered encounter medications on file as of 02/13/2018.     Allergies as of 02/13/2018 - Review Complete 02/13/2018  Allergen Reaction Noted  . Mobic [meloxicam] Diarrhea 11/28/2011  . Aleve [naproxen]  05/15/2016  . Codeine phosphate Other (See Comments) 03/15/2006  . Colestipol hcl  05/24/2017  . Omeprazole-sodium bicarbonate  12/23/2008  . Other  10/23/2017  . Protonix [pantoprazole sodium] Diarrhea 06/05/2012  . Sodium  11/16/2017  . Sulfamethoxazole Diarrhea and Nausea And Vomiting 03/15/2006    Past Medical History:  Diagnosis Date  . Allergy   . Anxiety   . Arthritis   . Blood transfusion without reported diagnosis   . Cataract    bil eyes  . Depression   . Diabetes mellitus without complication (HCC)    pre-diabetic, no meds, diet controlled  . Diverticulosis of colon   . Enteritis   . Erythema nodosum    Tends to be seasonal has had a negative chest x-ray in the past negative PPD  . Fibromyalgia   . GERD (gastroesophageal reflux disease)    History of stricture and dilatation and Nissen surgery  . Hypertension   . IBS (irritable bowel syndrome)   . Meniere's disease    ? dx per dr Marcelline Mates  . Mood disorder ([place])   . Neuromuscular disorder ([place])    fibromylagia  .  Osteopenia   . Sleep apnea    [facility].  pt does not wear a c-pap.    Past Surgical History:  Procedure Laterality Date  . ABDOMINAL HYSTERECTOMY    . bone spur    . CESAREAN SECTION     x 1  . CHOLECYSTECTOMY    . COLONOSCOPY    . ESOPHAGOGASTRODUODENOSCOPY     stricture and dilatation  . left shoulder surgery  06/08, 07/12/17   x 2  . NISSEN FUNDOPLICATION    . ROTATOR CUFF REPAIR Left   . rt knee surgery     scope  . UPPER GASTROINTESTINAL ENDOSCOPY      Family History  Problem Relation  Age of Onset  . Depression Mother   . Kidney cancer Mother   . Depression Father   . Hiatal hernia Father   . COPD Sister        Deceased 2009-12-20  . Nephrolithiasis Unknown   . Dementia Brother   . Colon cancer Neg Hx   . Esophageal cancer Neg Hx   . Pancreatic cancer Neg Hx   . Rectal cancer Neg Hx   . Stomach cancer Neg Hx     Social History   Socioeconomic History  . Marital status: Married    Spouse name: Not on file  . Number of children: 1  . Years of education: Not on file  . Highest education level: Not on file  Occupational History  . Occupation: Retired  Scientific laboratory technician  . Financial resource strain: Not on file  . Food insecurity:    Worry: Not on file    Inability: Not on file  . Transportation needs:    Medical: Not on file    Non-medical: Not on file  Tobacco Use  . Smoking status: Never Smoker  . Smokeless tobacco: Never Used  Substance and Sexual Activity  . Alcohol use: No    Alcohol/week: 0.0 standard drinks  . Drug use: No  . Sexual activity: Not on file  Lifestyle  . Physical activity:    Days per week: Not on file    Minutes per session: Not on file  . Stress: Not on file  Relationships  . Social connections:    Talks on phone: Not on file    Gets together: Not on file    Attends religious service: Not on file    Active member of club or organization: Not on file    Attends meetings of clubs or organizations: Not on file    Relationship status: Not on file  . Intimate partner violence:    Fear of current or ex partner: Not on file    Emotionally abused: Not on file    Physically abused: Not on file    Forced sexual activity: Not on file  Other Topics Concern  . Not on file  Social History Narrative   Unemployed   Married   [place] of 2    Has grandchildren out of state   No pets   Sis. died 12-20-2009 COPD    Review of Systems  HENT: Negative.   Eyes: Negative.   Respiratory: Positive for apnea and shortness of breath.     Cardiovascular: Negative.   Gastrointestinal: Negative.   Endocrine: Negative.   Psychiatric/Behavioral: Positive for sleep disturbance.  All other systems reviewed and are negative.   Vitals:   02/13/18 1102  BP: (!) 152/62  Pulse: 60  SpO2: 100%     Physical  Exam  Constitutional: She appears well-developed and well-nourished.  HENT:  Head: Normocephalic and atraumatic.  Eyes: Pupils are equal, round, and reactive to light. Conjunctivae and EOM are normal. Right eye exhibits no discharge. Left eye exhibits no discharge.  Neck: Normal range of motion. Neck supple. No thyromegaly present.  Cardiovascular: Normal rate.  Pulmonary/Chest: Effort normal and breath sounds normal.  Decreased air movement bilaterally  Abdominal: Soft. Bowel sounds are normal. There is no abdominal tenderness.  Musculoskeletal: Normal range of motion.  Psychiatric: She has a normal mood and affect.   Results of the Epworth flowsheet 02/13/2018 09/25/2017  Sitting and reading 0 0  Watching TV 3 1  Sitting, inactive in a public place (e.g. a theatre or a meeting) 0 0  As a passenger in a car for an hour without a break 0 0  Lying down to rest in the afternoon when circumstances permit 3 2  Sitting and talking to someone 0 0  Sitting quietly after a lunch without alcohol 0 0  In a car, while stopped for a few minutes in traffic 0 0  Total score 6 3    Assessment:   Obstructive sleep apnea for which patient is on BiPAP 18/14  Compliance data reveals excellent compliance but with very significant leaks AHI of 5.7  Nonrestorative sleep  Excessive daytime sleepiness   Plan/Recommendations:  We will reduce pressure to 14/10 We will monitor with downloads  I will see her back in the office in about 4 weeks  Pathophysiology of sleep disordered breathing discussed with patient Options of treatment discussed with patient-other treatment options if BiPAP is ultimately not tolerated  discussed  She does have significant hypoventilation, PFT will be considered following improvement with her shoulder discomfort  Sherrilyn Rist MD [facility] 02/13/2018, 12:42 PM  CC: Panosh, Standley Brooking, MD

## 2018-02-13 NOTE — Patient Instructions (Signed)
Obstructive sleep Apnea  We will change your BIPAP settings to 14/10.  We will follow up in a month for download with Dr. Ander Slade  Please call with any questions or concerns.

## 2018-02-25 ENCOUNTER — Other Ambulatory Visit: Payer: Self-pay | Admitting: Internal Medicine

## 2018-02-25 NOTE — Telephone Encounter (Signed)
Telephone message

## 2018-03-12 ENCOUNTER — Other Ambulatory Visit: Payer: Self-pay

## 2018-03-12 ENCOUNTER — Encounter: Payer: Self-pay | Admitting: Neurology

## 2018-03-12 ENCOUNTER — Ambulatory Visit (INDEPENDENT_AMBULATORY_CARE_PROVIDER_SITE_OTHER): Payer: Medicare Other | Admitting: Neurology

## 2018-03-12 VITALS — BP 98/64 | HR 64 | Ht <= 58 in | Wt 145.0 lb

## 2018-03-12 DIAGNOSIS — R413 Other amnesia: Secondary | ICD-10-CM | POA: Diagnosis not present

## 2018-03-12 NOTE — Patient Instructions (Signed)
1. Once we have a Neuropsychologist to do memory testing, we will get you scheduled in our office 2. Follow-up in 1 year, call for any changes   RECOMMENDATIONS FOR ALL PATIENTS WITH MEMORY PROBLEMS: 1. Continue to exercise (Recommend 30 minutes of walking everyday, or 3 hours every week) 2. Increase social interactions - continue going to [place] and enjoy social gatherings with friends and family 3. Eat healthy, avoid fried foods and eat more fruits and vegetables 4. Maintain adequate blood pressure, blood sugar, and blood cholesterol level. Reducing the risk of stroke and cardiovascular disease also helps promoting better memory. 5. Avoid stressful situations. Live a simple life and avoid aggravations. Organize your time and prepare for the next day in anticipation. 6. Sleep well, avoid any interruptions of sleep and avoid any distractions in the bedroom that may interfere with adequate sleep quality 7. Avoid sugar, avoid sweets as there is a strong link between excessive sugar intake, diabetes, and cognitive impairment We discussed the Mediterranean diet, which has been shown to help patients reduce the risk of progressive memory disorders and reduces cardiovascular risk. This includes eating fish, eat fruits and green leafy vegetables, nuts like almonds and hazelnuts, walnuts, and also use olive oil. Avoid fast foods and fried foods as much as possible. Avoid sweets and sugar as sugar use has been linked to worsening of memory function.

## 2018-03-12 NOTE — Progress Notes (Signed)
NEUROLOGY FOLLOW UP OFFICE NOTE  Caitlin [name] [id number]  DOB: Apr 02, 1945  HISTORY OF PRESENT ILLNESS: I had the pleasure of seeing Caitlin [name] in follow-up in the neurology clinic on 03/12/2018.  The patient was last seen a year ago for worsening memory. MMSE 28/30 in February 2019 (29/30 in February 2018, 30/30 in February 2017). She feels her memory is worse since her last visit. She has more difficulty remembering names and words, she would be in the middle of a conversation and would stop because she cannot remember a word. She has to be extra cautious with her medications, if she gets distracted, she does not remember if she took it. She has a pillbox but would not remember prn medications. She denies getting lost driving but would miss a turn. Her husband manages finances. She is now using a BiPAP but is having difficulties with her mask, she did feel there was a little improvement in her memory on the mask but she does not feel refreshed. She fell in June and has had 3 shoulder surgeries with continued difficulties. She denies any headaches, dizziness, vision changes.   HPI 09/01/14: This is a 73 yo RH woman with a history of hypertension, diet-controlled hyperlipidemia, IBS, GERD, chronic neck and back pain, depression and anxiety, who presented for worsening memory. She reports that memory changes started several years ago, she would be unable to think of a name or word she wanted to say. She has had to write everything down otherwise she would forget appointments. She recalls the first time this happened was 12 years ago, she got a reminder call about a dentist appointment 2 days prior but still missed it. She is has been stressed and worried because 2 of her husband's family members have recently passed away with dementia. She continues to drive without getting lost. She denies missing her regular medications but occasionally forgets her vitamins. She has to stay by the stove when cooking,  one time she walked away from soup she was making and forgot about it. Her husband is in charge of bill payments. She has always had problems multi-tasking, but has noticed this has been worse the past couple of years, she easily loses her train of thought when distracted. Her maternal grandmother and 2 brothers were diagnosed with dementia. Her mother and sister had memory problems before they passed away from other medical conditions. She denies any significant head injuries, no alcohol use.   Diagnostic Data: MRI brain in 2012 was personally reviewed today, unremarkable with mild diffuse atrophy. Similar to MRI brain done 08/2014 as above.  PAST MEDICAL HISTORY: Past Medical History:  Diagnosis Date  . Allergy   . Anxiety   . Arthritis   . Blood transfusion without reported diagnosis   . Cataract    bil eyes  . Depression   . Diabetes mellitus without complication (HCC)    pre-diabetic, no meds, diet controlled  . Diverticulosis of colon   . Enteritis   . Erythema nodosum    Tends to be seasonal has had a negative chest x-ray in the past negative PPD  . Fibromyalgia   . GERD (gastroesophageal reflux disease)    History of stricture and dilatation and Nissen surgery  . Hypertension   . IBS (irritable bowel syndrome)   . Meniere's disease    ? dx per dr Marcelline Mates  . Mood disorder ([place])   . Neuromuscular disorder ([place])    fibromylagia  . Osteopenia   .  Sleep apnea    [facility].  pt does not wear a c-pap.    MEDICATIONS: Current Outpatient Medications on File Prior to Visit  Medication Sig Dispense Refill  . calcium carbonate (TUMS EX) 750 MG chewable tablet Chew 1 tablet by mouth as needed for heartburn.    . cholecalciferol (VITAMIN D) 1000 UNITS tablet Take 2,000 Units by mouth daily.     [name] dicyclomine (BENTYL) 10 MG capsule TAKE 1 CAPSULE BY MOUTH EVERY 8 HOURS AS NEEDED FOR SPASMS 90 capsule 11  . escitalopram (LEXAPRO) 10 MG tablet Take 1 tablet (10 mg total) by  mouth daily. 90 tablet 3  . esomeprazole (NEXIUM) 20 MG capsule Take 20 mg by mouth daily at 12 noon. Reported on 03/08/2015    . FREESTYLE LITE test strip USE TO CHECK BLOOD SUGAR DAILY AND AS NEEDED 100 each 4  . hydrochlorothiazide (HYDRODIURIL) 12.5 MG tablet TAKE 1 TABLET BY MOUTH ONCE DAILY 90 tablet 0  . Lancets (FREESTYLE) lancets USE TO CHECK BLOOD SUGAR DAILY AND AS NEEDED 100 each 4  . ondansetron (ZOFRAN-ODT) 4 MG disintegrating tablet Take 1 tablet by mouth as needed.     . potassium chloride (K-DUR,KLOR-CON) 10 MEQ tablet TAKE 1 TABLET BY MOUTH EVERY OTHER DAY WITH  FLUID  TABLET 45 tablet 1   No current facility-administered medications on file prior to visit.     ALLERGIES: Allergies  Allergen Reactions  . Mobic [Meloxicam] Diarrhea  . Aleve [Naproxen]     Upset stomach if takes more than once a day  . Codeine Phosphate Other (See Comments)    Hallucinations  . Colestipol Hcl     Patient reports reaction is "erythema multiforme"  . Omeprazole-Sodium Bicarbonate     Sever diarrhea, stomach cramps  . Other     States she can't tolerate milk, yogurt, cheese , ice cream and diarrhea  . Protonix [Pantoprazole Sodium] Diarrhea    Leg nodules  . Sodium     Other reaction(s): Other (See Comments) unknown  . Sulfamethoxazole Diarrhea and Nausea And Vomiting    FAMILY HISTORY: Family History  Problem Relation Age of Onset  . Depression Mother   . Kidney cancer Mother   . Depression Father   . Hiatal hernia Father   . COPD Sister        Deceased 30-Dec-2009  . Nephrolithiasis Unknown   . Dementia Brother   . Colon cancer Neg Hx   . Esophageal cancer Neg Hx   . Pancreatic cancer Neg Hx   . Rectal cancer Neg Hx   . Stomach cancer Neg Hx     SOCIAL HISTORY: Social History   Socioeconomic History  . Marital status: Married    Spouse name: Not on file  . Number of children: 1  . Years of education: Not on file  . Highest education level: Not on file    Occupational History  . Occupation: Retired  Scientific laboratory technician  . Financial resource strain: Not on file  . Food insecurity:    Worry: Not on file    Inability: Not on file  . Transportation needs:    Medical: Not on file    Non-medical: Not on file  Tobacco Use  . Smoking status: Never Smoker  . Smokeless tobacco: Never Used  Substance and Sexual Activity  . Alcohol use: No    Alcohol/week: 0.0 standard drinks  . Drug use: No  . Sexual activity: Not on file  Lifestyle  .  Physical activity:    Days per week: Not on file    Minutes per session: Not on file  . Stress: Not on file  Relationships  . Social connections:    Talks on phone: Not on file    Gets together: Not on file    Attends religious service: Not on file    Active member of club or organization: Not on file    Attends meetings of clubs or organizations: Not on file    Relationship status: Not on file  . Intimate partner violence:    Fear of current or ex partner: Not on file    Emotionally abused: Not on file    Physically abused: Not on file    Forced sexual activity: Not on file  Other Topics Concern  . Not on file  Social History Narrative   Unemployed   Married   [place] of 2    Has grandchildren out of state   No pets   Sis. died 12-19-09 COPD    REVIEW OF SYSTEMS: Constitutional: No fevers, chills, or sweats, no generalized fatigue, change in appetite Eyes: No visual changes, double vision, eye pain Ear, nose and throat: No hearing loss, ear pain, nasal congestion, sore throat Cardiovascular: No chest pain, palpitations Respiratory:  No shortness of breath at rest or with exertion, wheezes GastrointestinaI: No nausea, vomiting, +diarrhea,no abdominal pain, fecal incontinence Genitourinary:  No dysuria, urinary retention or frequency Musculoskeletal:  + neck pain, back pain, left shoulder pain Integumentary: No rash, pruritus, skin lesions Neurological: as above Psychiatric: No depression,  insomnia, anxiety Endocrine: No palpitations, fatigue, diaphoresis, mood swings, change in appetite, change in weight, increased thirst Hematologic/Lymphatic:  No anemia, purpura, petechiae. Allergic/Immunologic: no itchy/runny eyes, nasal congestion, recent allergic reactions, rashes  PHYSICAL EXAM: Vitals:   03/12/18 1523  BP: 98/64  Pulse: 64  SpO2: 98%   General: No acute distress Head:  Normocephalic/atraumatic Neck: supple, no paraspinal tenderness, full range of motion Heart:  Regular rate and rhythm Lungs:  Clear to auscultation bilaterally Back: No paraspinal tenderness Skin/Extremities: No rash, no edema Neurological Exam: alert and oriented to person, place, and time. No aphasia or dysarthria. Fund of knowledge is appropriate.  Recent and remote memory are intact.  Attention and concentration are normal. More difficulty today spelling WORLD backward.  Able to name objects and repeat phrases. CDT 5/5. MMSE - Mini Mental State Exam 03/12/2018 10/23/2017 03/12/2017  Not completed: - (No Data) -  Orientation to time 5 - 5  Orientation to Place 5 - 5  Registration 3 - 3  Attention/ Calculation 3 - 3  Recall 3 - 3  Language- name 2 objects 2 - 2  Language- repeat 1 - 1  Language- follow 3 step command 3 - 3  Language- read & follow direction 1 - 1  Write a sentence 1 - 1  Copy design 1 - 1  Total score 28 - 28   Cranial nerves: Pupils equal, round. No facial asymmetry.  Motor: moves all extremities symmetrically. Gait narrow-based and steady.  IMPRESSION: This is a 73 yo RH woman with a history of hypertension, diet-controlled hyperlipidemia, depression, anxiety, GERD,who presented for worsening memory loss. MMSE today again normal 28/30 (28/30 in February 2019, 29/30 in February 2018, 30/30 in February 2017 and August 2016). MRI brain unremarkable, TSH and B12 normal. We discussed age-related memory changes, there has been no significant decline since her initial visit in  2016. She continues to be anxious about  dementia due to family history, and will be scheduled for Neurocognitive testing to further evaluate cognitive complaints. Continue working on sleep apnea. We again discussed the importance of control of vascular risk factors, physical exercise, and brain stimulation exercises for brain health. She will follow-up in 1 year or earlier if needed.   Thank you for allowing me to participate in her care.  Please do not hesitate to call for any questions or concerns.  The duration of this appointment visit was 30 minutes of face-to-face time with the patient.  Greater than 50% of this time was spent in counseling, explanation of diagnosis, planning of further management, and coordination of care.   [name], M.D.   CC: Dr. Regis Bill

## 2018-03-15 ENCOUNTER — Ambulatory Visit (INDEPENDENT_AMBULATORY_CARE_PROVIDER_SITE_OTHER): Payer: Medicare Other | Admitting: Pulmonary Disease

## 2018-03-15 ENCOUNTER — Encounter: Payer: Self-pay | Admitting: Pulmonary Disease

## 2018-03-15 VITALS — BP 112/62 | HR 69 | Ht <= 58 in | Wt 144.0 lb

## 2018-03-15 DIAGNOSIS — G4733 Obstructive sleep apnea (adult) (pediatric): Secondary | ICD-10-CM | POA: Diagnosis not present

## 2018-03-15 NOTE — Patient Instructions (Signed)
DME for mask supplies  Trial with melatonin 5 mg nightly, may increase to 10 if not working efficiently  If melatonin is not helping after about a week or 2, call and we can try low-dose of Klonopin   Encourage you to start exercising on a regular basis I do believe your sleep quality will improve significantly if you are able to get more active  I will see back in the office in about 6 weeks

## 2018-03-15 NOTE — Progress Notes (Signed)
Caitlin [name]    [id number]    10/07/1945  Primary Care Physician:Panosh, Standley Brooking, MD  Referring Physician: Burnis Medin, MD [place], [place] [address]  Chief complaint:  History of obstructive sleep apnea Significant daytime sleepiness  HPI: Having difficulty tolerating BiPAP -Better tolerated with pressure change -She went back to her old mask and this seems to fit better still sleeps for many hours-not able to fall asleep easily but stays in bed for much longer  She is not very active at all during the day according to spouse and patient as well-very limited activity which she blames on her fibromyalgia  Study did reveal obstructive sleep apnea titrated to BiPAP of 18/14  Patient with a known history of obstructive sleep apnea During recent surgery, noted to have significant desaturations, concern for sleep disordered breathing with significant Known snorer Had a sleep study done a few years back that was positive for sleep disordered breathing, no treatment was offered at the time Usually goes to bed about 1 AM, gets up about 10:30 AM or later 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 Weight has been about the same Has been having more issues recently secondary to having a lot of pain and discomfort from a recent shoulder surgery Sleep has not been significantly consolidated recently   Outpatient Encounter Medications as of 03/15/2018  Medication Sig  . calcium carbonate (TUMS EX) 750 MG chewable tablet Chew 1 tablet by mouth as needed for heartburn.  . cholecalciferol (VITAMIN D) 1000 UNITS tablet Take 2,000 Units by mouth daily.   [name] dicyclomine (BENTYL) 10 MG capsule TAKE 1 CAPSULE BY MOUTH EVERY 8 HOURS AS NEEDED FOR SPASMS  . escitalopram (LEXAPRO) 10 MG tablet Take 1 tablet (10 mg total) by mouth daily.  [name] esomeprazole (NEXIUM) 20 MG capsule Take 20 mg by mouth daily at 12 noon. Reported on 03/08/2015  . FREESTYLE LITE test strip USE TO  CHECK BLOOD SUGAR DAILY AND AS NEEDED  . hydrochlorothiazide (HYDRODIURIL) 12.5 MG tablet TAKE 1 TABLET BY MOUTH ONCE DAILY  . Lancets (FREESTYLE) lancets USE TO CHECK BLOOD SUGAR DAILY AND AS NEEDED  . potassium chloride (K-DUR,KLOR-CON) 10 MEQ tablet TAKE 1 TABLET BY MOUTH EVERY OTHER DAY WITH  FLUID  TABLET  . [DISCONTINUED] ondansetron (ZOFRAN-ODT) 4 MG disintegrating tablet Take 1 tablet by mouth as needed.    No facility-administered encounter medications on file as of 03/15/2018.     Allergies as of 03/15/2018 - Review Complete 03/15/2018  Allergen Reaction Noted  . Mobic [meloxicam] Diarrhea 11/28/2011  . Aleve [naproxen]  05/15/2016  . Codeine phosphate Other (See Comments) 03/15/2006  . Colestipol hcl  05/24/2017  . Omeprazole-sodium bicarbonate  12/23/2008  . Other  10/23/2017  . Protonix [pantoprazole sodium] Diarrhea 06/05/2012  . Sodium  11/16/2017  . Sulfamethoxazole Diarrhea and Nausea And Vomiting 03/15/2006    Past Medical History:  Diagnosis Date  . Allergy   . Anxiety   . Arthritis   . Blood transfusion without reported diagnosis   . Cataract    bil eyes  . Depression   . Diabetes mellitus without complication (HCC)    pre-diabetic, no meds, diet controlled  . Diverticulosis of colon   . Enteritis   . Erythema nodosum    Tends to be seasonal has had a negative chest x-ray in the past negative PPD  . Fibromyalgia   . GERD (gastroesophageal reflux disease)    History  of stricture and dilatation and Nissen surgery  . Hypertension   . IBS (irritable bowel syndrome)   . Meniere's disease    ? dx per dr Marcelline Mates  . Mood disorder ([place])   . Neuromuscular disorder ([place])    fibromylagia  . Osteopenia   . Sleep apnea    [facility].  pt does not wear a c-pap.    Past Surgical History:  Procedure Laterality Date  . ABDOMINAL HYSTERECTOMY    . bone spur    . CESAREAN SECTION     x 1  . CHOLECYSTECTOMY    . COLONOSCOPY    .  ESOPHAGOGASTRODUODENOSCOPY     stricture and dilatation  . left shoulder surgery  06/08, 07/12/17   x 2  . NISSEN FUNDOPLICATION    . ROTATOR CUFF REPAIR Left   . rt knee surgery     scope  . UPPER GASTROINTESTINAL ENDOSCOPY      Family History  Problem Relation Age of Onset  . Depression Mother   . Kidney cancer Mother   . Depression Father   . Hiatal hernia Father   . COPD Sister        Deceased 2009/12/28  . Nephrolithiasis Unknown   . Dementia Brother   . Colon cancer Neg Hx   . Esophageal cancer Neg Hx   . Pancreatic cancer Neg Hx   . Rectal cancer Neg Hx   . Stomach cancer Neg Hx     Social History   Socioeconomic History  . Marital status: Married    Spouse name: Not on file  . Number of children: 1  . Years of education: Not on file  . Highest education level: Not on file  Occupational History  . Occupation: Retired  Scientific laboratory technician  . Financial resource strain: Not on file  . Food insecurity:    Worry: Not on file    Inability: Not on file  . Transportation needs:    Medical: Not on file    Non-medical: Not on file  Tobacco Use  . Smoking status: Never Smoker  . Smokeless tobacco: Never Used  Substance and Sexual Activity  . Alcohol use: No    Alcohol/week: 0.0 standard drinks  . Drug use: No  . Sexual activity: Not on file  Lifestyle  . Physical activity:    Days per week: Not on file    Minutes per session: Not on file  . Stress: Not on file  Relationships  . Social connections:    Talks on phone: Not on file    Gets together: Not on file    Attends religious service: Not on file    Active member of club or organization: Not on file    Attends meetings of clubs or organizations: Not on file    Relationship status: Not on file  . Intimate partner violence:    Fear of current or ex partner: Not on file    Emotionally abused: Not on file    Physically abused: Not on file    Forced sexual activity: Not on file  Other Topics Concern  . Not  on file  Social History Narrative   Unemployed   Married   [place] of 2    Has grandchildren out of state   No pets   Sis. died 2009-12-28 COPD    Review of Systems  HENT: Negative.   Eyes: Negative.   Respiratory: Positive for apnea and shortness of breath.   Cardiovascular:  Negative.   Gastrointestinal: Negative.   Endocrine: Negative.   Musculoskeletal: Positive for arthralgias and myalgias.  Psychiatric/Behavioral: Positive for sleep disturbance.    Vitals:   03/15/18 1337  BP: 112/62  Pulse: 69  SpO2: 96%     Physical Exam  Constitutional: She appears well-developed and well-nourished.  HENT:  Head: Normocephalic and atraumatic.  Eyes: Pupils are equal, round, and reactive to light. Conjunctivae and EOM are normal. Right eye exhibits no discharge. Left eye exhibits no discharge.  Neck: Normal range of motion. Neck supple. No thyromegaly present.  Cardiovascular: Normal rate.  Pulmonary/Chest: Effort normal and breath sounds normal.  Decreased air movement bilaterally  Musculoskeletal: Normal range of motion.  Psychiatric: She has a normal mood and affect.   Results of the Epworth flowsheet 03/15/2018 02/13/2018 09/25/2017  Sitting and reading 0 0 0  Watching TV 3 3 1   Sitting, inactive in a public place (e.g. a theatre or a meeting) 0 0 0  As a passenger in a car for an hour without a break 0 0 0  Lying down to rest in the afternoon when circumstances permit 3 3 2   Sitting and talking to someone 0 0 0  Sitting quietly after a lunch without alcohol 0 0 0  In a car, while stopped for a few minutes in traffic 0 0 0  Total score 6 6 3     Assessment:   Obstructive sleep apnea for which patient is on BiPAP 14/10  Compliance data reveals excellent compliance but with very significant leaks AHI of 9.1-this increased since decreasing the pressures-tolerates this pressure better  Nonrestorative sleep  Excessive daytime  sleepiness/fatigue   Plan/Recommendations:  Continue pressure of 14/10 We will monitor with downloads   Encouraged to try and get more active  Sleep onset and maintenance insomnia We talked about trial with melatonin at 5 mg which may be increased to 10 mg as needed  Use of low-dose Klonopin also discussed  She will try melatonin first and then call if this is not working  The importance of increasing her physical activity importance of exercising on a regular basis was discussed extensively  I will see her back in the office in about 4 -6 weeks  Pathophysiology of sleep disordered breathing discussed with patient Options of treatment discussed with patient-other treatment options if BiPAP is ultimately not tolerated discussed  She does have significant hypoventilation-strongly encouraged to increase activity  Sherrilyn Rist MD [facility] 03/15/2018, 3:00 PM  CC: Panosh, Standley Brooking, MD

## 2018-03-27 ENCOUNTER — Ambulatory Visit: Payer: Medicare Other | Admitting: Pulmonary Disease

## 2018-04-03 IMAGING — CT CT ABD-PELV W/ CM
2 of 5 series · 16 of 46 positions shown, 18 images · IV contrast (APPLIED)
Comparison: None.

CLINICAL DATA: Nausea, vomiting, diarrhea since last week with
anorexia

EXAM:
CT ABDOMEN AND PELVIS WITH CONTRAST
TECHNIQUE: Multidetector CT imaging of the abdomen and pelvis was performed
using the standard protocol following bolus administration of
intravenous contrast.
CONTRAST:  75mL [id number]-JAA IOPAMIDOL ([id number]-JAA) INJECTION 61%

[Series 3: abd/ pelvis 5.0 i30f 2 · axial · 0.79mm/px · z∈[-384,+16]mm · 13 of 90 slices shown, 15 images]
[im 5/90  soft-tissue]
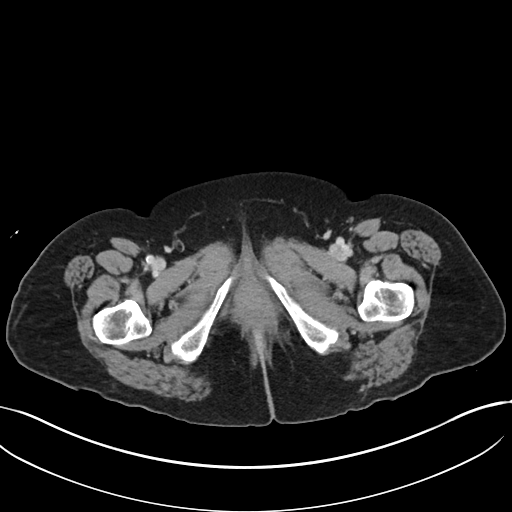
[im 5/90  bone]
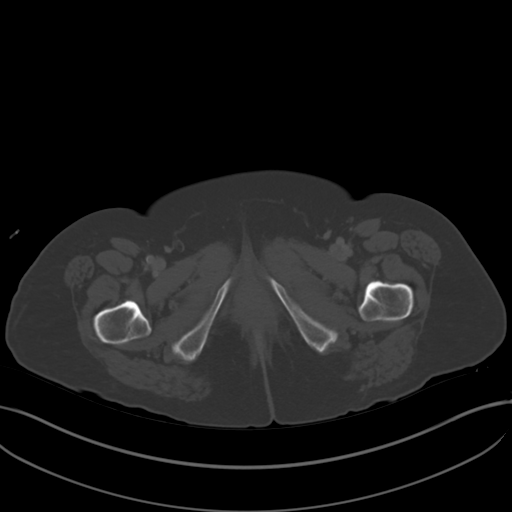
[im 15/90  soft-tissue]
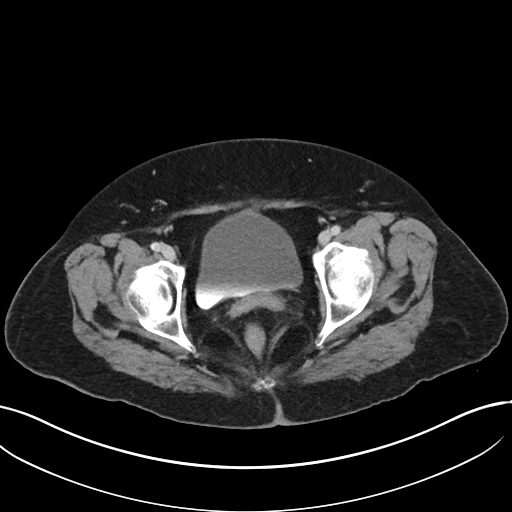
[im 19/90  soft-tissue]
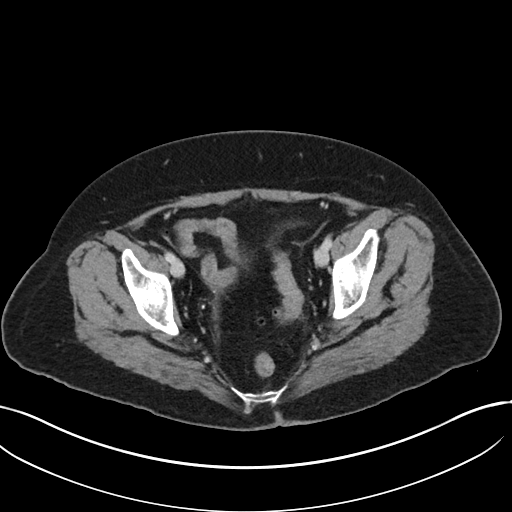
[im 24/90  soft-tissue]
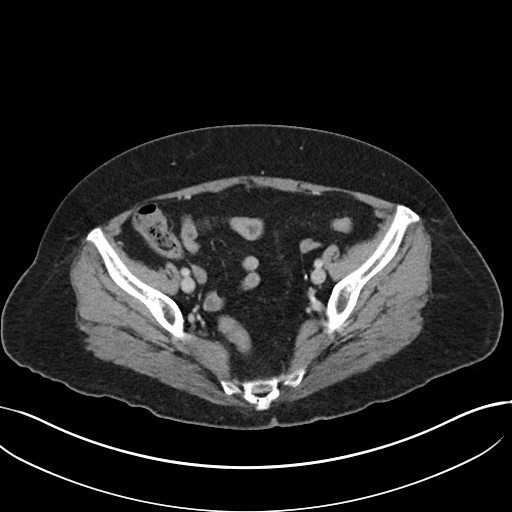
[im 33/90  soft-tissue]
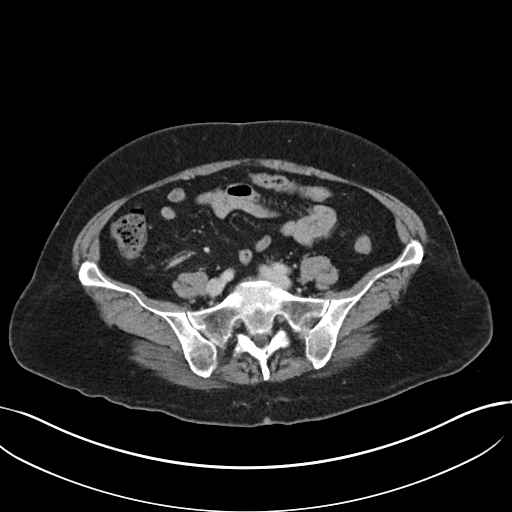
[im 38/90  soft-tissue]
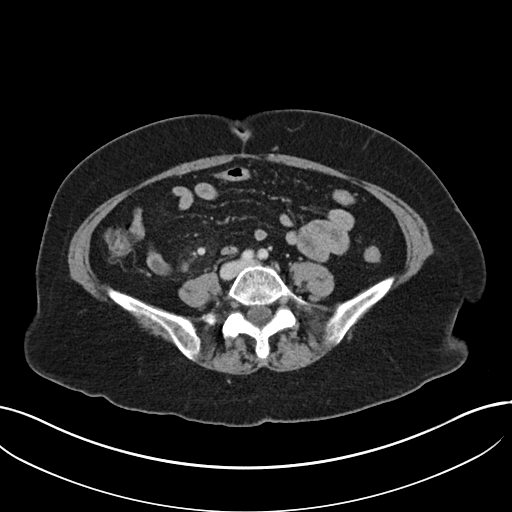
[im 47/90  soft-tissue]
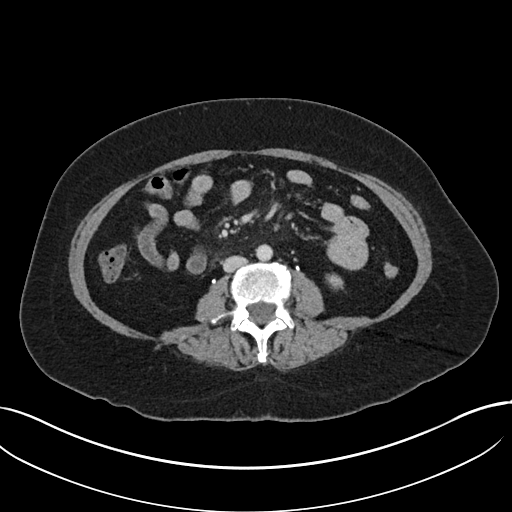
[im 52/90  soft-tissue]
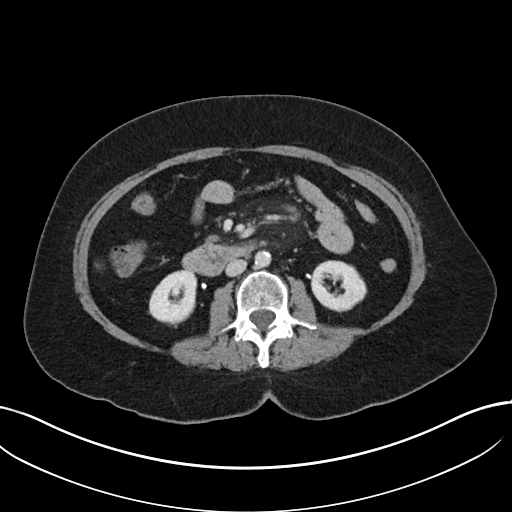
[im 57/90  soft-tissue]
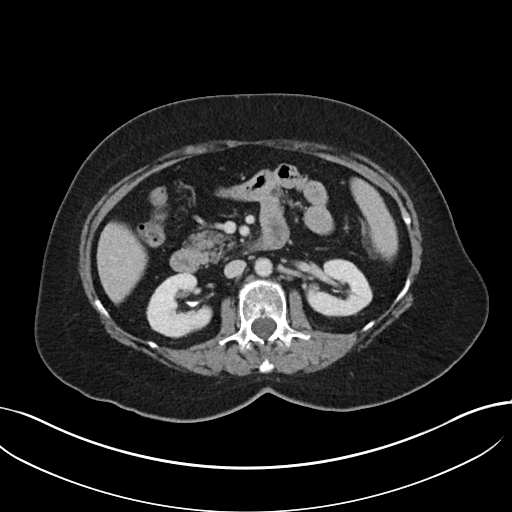
[im 57/90  bone]
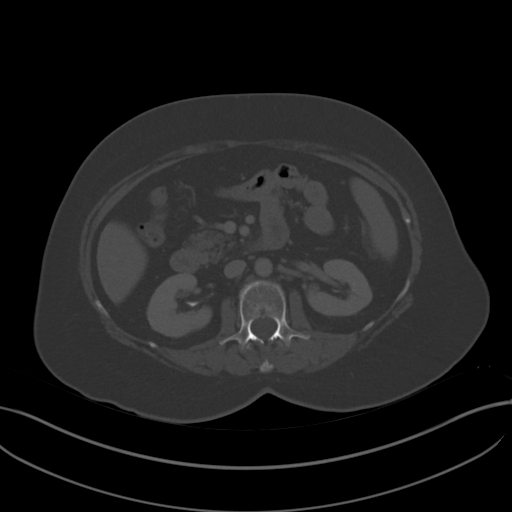
[im 66/90  soft-tissue]
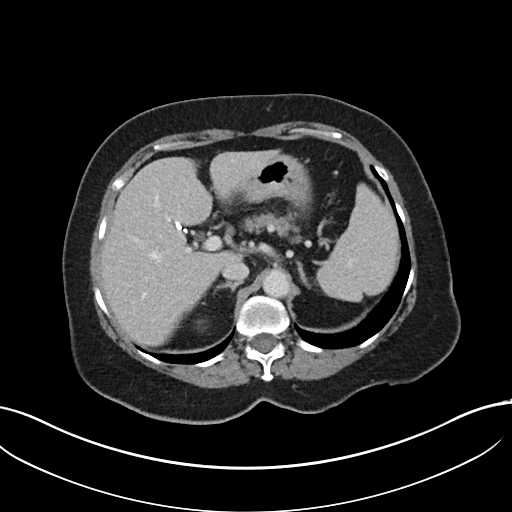
[im 71/90  soft-tissue]
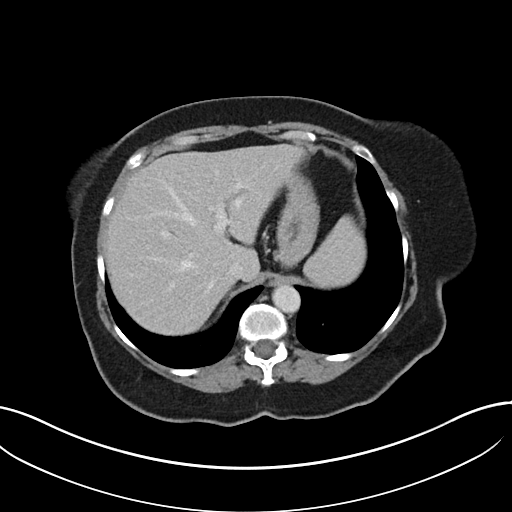
[im 75/90  soft-tissue]
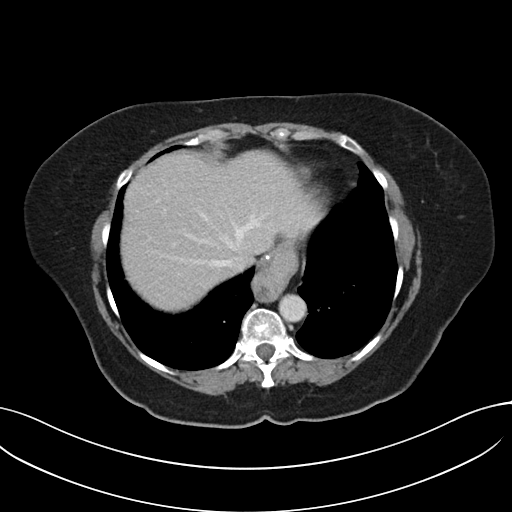
[im 85/90  soft-tissue]
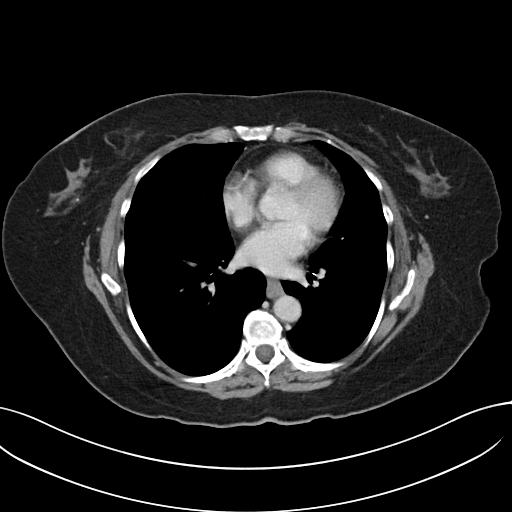

[Series 6: coronal soft tissue · coronal · 0.83mm/px · 3 of 100 slices shown]
[im 34/100  soft-tissue]
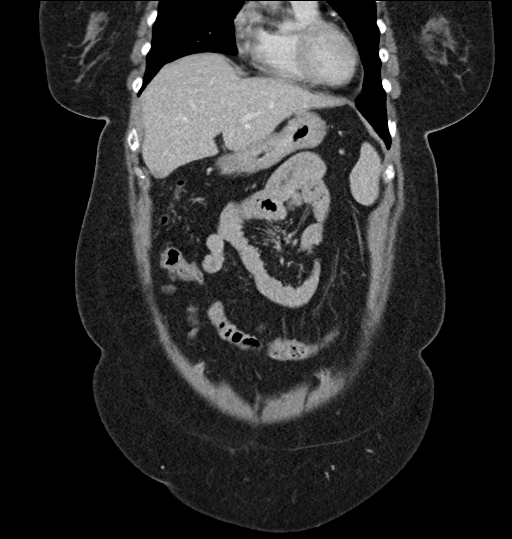
[im 45/100  soft-tissue]
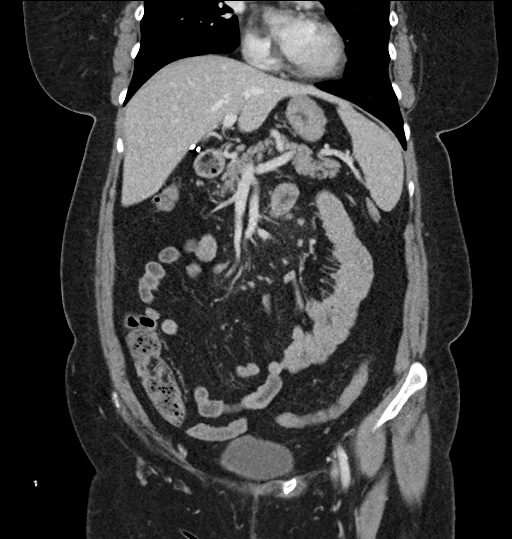
[im 56/100  soft-tissue]
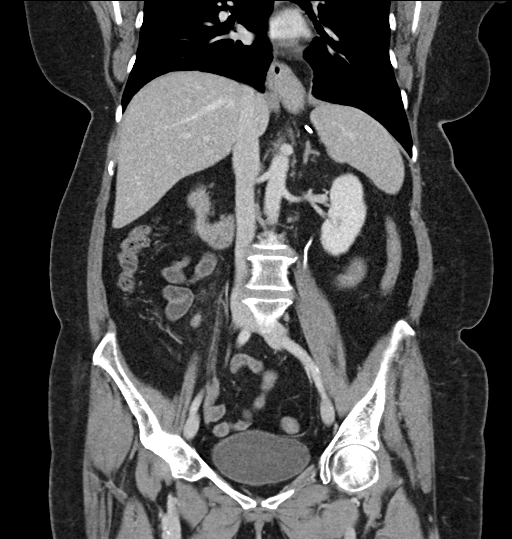

[16 of 46 positions shown; findings below may reference images not displayed]

FINDINGS: Lower chest: Changes of a Nissen fundoplication are present. Normal
size cardiac chambers. Clear lung bases.

Hepatobiliary: Hepatic steatosis. Cholecystectomy. No mass or
biliary dilatation.

Pancreas: Normal

Spleen: Normal

Adrenals/Urinary Tract: Adrenal glands are unremarkable. Kidneys are
normal, without renal calculi, focal lesion, or hydronephrosis.
Bladder is unremarkable.

Stomach/Bowel: The stomach is contracted. There is normal small
bowel rotation. Mild thickening of proximal small bowel loops may
represent changes of an enteritis. No bowel obstruction is seen. The
appendix is not visualized but no convincing evidence of acute
appendicitis. No pericecal inflammation is noted.

Vascular/Lymphatic: Aortic atherosclerosis. Bold abdomen within the
mesentery, there are small subcentimeter short axis lymph nodes
surrounded by hazy mesenteric fat, the largest however is seen in
the right lower quadrant mesenteric sharp measuring up to 1 cm.
Findings may be secondary to a sclerosing mesenteritis.

Reproductive: Hysterectomy.  No adnexal mass.

Other: No abdominal wall hernia or abnormality. No abdominopelvic
ascites.

Musculoskeletal: Minimal superior endplate compression of T11. No
acute nor suspicious osseous appearing abnormalities.
IMPRESSION: 1. Hazy mesenteric fat with small lymph nodes associated with this
abnormality. Findings raise the possibility of a sclerosing
mesenteritis.
2. Mild thickening of proximal small bowel loops suspicious for an
enteritis. No bowel obstruction or free air.
3. Hepatic steatosis.
4. Cholecystectomy, Nissen fundoplication and hysterectomy.

## 2018-04-17 ENCOUNTER — Telehealth: Payer: Self-pay | Admitting: *Deleted

## 2018-04-17 NOTE — Telephone Encounter (Signed)
Patient called questioning what to do for the visit she has scheduled on 4/6.  I explained to the pt the visit can be done via Webex or a phone call.  Patient agreed to do this via Webex and the email is correct in the chart.  Message sent to [facility] to arrange Webex visit.

## 2018-04-19 NOTE — Progress Notes (Signed)
Virtual Visit via Video Note  I connected with@ on 04/22/18 at  2:00 PM EDT by a video enabled telemedicine application and verified that I am speaking with the correct person using two identifiers. Location patient: home Location provider:work or home office Persons participating in the virtual visit: patient, provider  [place] national recommendations  regarding COVID 19 pandemic   video visit is advised over in office visit for this patient.  Discussed the limitations of evaluation and management by telemedicine and  availability of in person appointments. The patient expressed understanding and agreed to proceed. Took about 20 minutes to get the connection  working    HPI: Caitlin [name]  Virtual visit for Chronic disease management    OSA   :Mask nto fitting   Small not enough   Husband says sleeps all the time but she says otherwise doeing ok   BG: refill strips requested  Not eating as well  Sweets  Stress of the  Isolations  But doing ok .  GI ibs no major change  On nexium and prn bentyl  Med reviewed    Mood stable  On med  BP hasnt checked readings had been ok  Taking meds  ( for ear? ) but bp ok    ROS: See pertinent positives and negatives per HPI. No cp sob syncope sleeps a lot per husband  Sees dr Delice Lesch for memory issues stable   awaiting testing when available  Past Medical History:  Diagnosis Date  . Allergy   . Anxiety   . Arthritis   . Blood transfusion without reported diagnosis   . Cataract    bil eyes  . Depression   . Diabetes mellitus without complication (HCC)    pre-diabetic, no meds, diet controlled  . Diverticulosis of colon   . Enteritis   . Erythema nodosum    Tends to be seasonal has had a negative chest x-ray in the past negative PPD  . Fibromyalgia   . GERD (gastroesophageal reflux disease)    History of stricture and dilatation and Nissen surgery  . Hypertension   . IBS (irritable bowel syndrome)   . Meniere's disease    ? dx per  dr Marcelline Mates  . Mood disorder ([place])   . Neuromuscular disorder ([place])    fibromylagia  . Osteopenia   . Sleep apnea    [facility].  pt does not wear a c-pap.    Past Surgical History:  Procedure Laterality Date  . ABDOMINAL HYSTERECTOMY    . bone spur    . CESAREAN SECTION     x 1  . CHOLECYSTECTOMY    . COLONOSCOPY    . ESOPHAGOGASTRODUODENOSCOPY     stricture and dilatation  . left shoulder surgery  06/08, 07/12/17   x 2  . NISSEN FUNDOPLICATION    . ROTATOR CUFF REPAIR Left   . rt knee surgery     scope  . UPPER GASTROINTESTINAL ENDOSCOPY      Family History  Problem Relation Age of Onset  . Depression Mother   . Kidney cancer Mother   . Depression Father   . Hiatal hernia Father   . COPD Sister        Deceased 27-Dec-2009  . Nephrolithiasis Unknown   . Dementia Brother   . Colon cancer Neg Hx   . Esophageal cancer Neg Hx   . Pancreatic cancer Neg Hx   . Rectal cancer Neg Hx   . Stomach  cancer Neg Hx     SOCIAL HX:    Current Outpatient Medications:  .  calcium carbonate (TUMS EX) 750 MG chewable tablet, Chew 1 tablet by mouth as needed for heartburn., Disp: , Rfl:  .  cholecalciferol (VITAMIN D) 1000 UNITS tablet, Take 2,000 Units by mouth daily. , Disp: , Rfl:  .  dicyclomine (BENTYL) 10 MG capsule, TAKE 1 CAPSULE BY MOUTH EVERY 8 HOURS AS NEEDED FOR SPASMS, Disp: 90 capsule, Rfl: 11 .  escitalopram (LEXAPRO) 10 MG tablet, Take 1 tablet (10 mg total) by mouth daily., Disp: 90 tablet, Rfl: 3 .  esomeprazole (NEXIUM) 20 MG capsule, Take 20 mg by mouth daily at 12 noon. Reported on 03/08/2015, Disp: , Rfl:  .  glucose blood (FREESTYLE LITE) test strip, USE TO CHECK BLOOD SUGAR DAILY AND AS NEEDED, Disp: 100 each, Rfl: 4 .  hydrochlorothiazide (HYDRODIURIL) 12.5 MG tablet, TAKE 1 TABLET BY MOUTH ONCE DAILY, Disp: 90 tablet, Rfl: 0 .  Lancets (FREESTYLE) lancets, USE TO CHECK BLOOD SUGAR DAILY AND AS NEEDED, Disp: 100 each, Rfl: 4 .  potassium chloride  (K-DUR,KLOR-CON) 10 MEQ tablet, TAKE 1 TABLET BY MOUTH EVERY OTHER DAY WITH  FLUID  TABLET, Disp: 45 tablet, Rfl: 1  EXAM: BP Readings from Last 3 Encounters:  03/15/18 112/62  03/12/18 98/64  02/13/18 (!) 152/62   Wt Readings from Last 3 Encounters:  03/15/18 144 lb (65.3 kg)  03/12/18 145 lb (65.8 kg)  02/13/18 145 lb 3.2 oz (65.9 kg)     VITALS per patient if applicable: looks well animated    GENERAL: alert, oriented, appears well and in no acute distress  HEENT: atraumatic, conjunttiva clear, no obvious abnormalities on inspection of external nose and ears  NECK: normal movements of the head and neck  LUNGS: on inspection no signs of respiratory distress, breathing rate appears normal, no obvious gross SOB, gasping or wheezing  CV: no obvious cyanosis  MS: moves all visible extremities without noticeable abnormality  PSYCH/NEURO: pleasant and cooperative, no obvious depression or anxiety, speech and thought processing grossly intact Lab Results  Component Value Date   WBC 7.0 04/17/2017   HGB 14.1 04/17/2017   HCT 42.4 04/17/2017   PLT 301.0 04/17/2017   GLUCOSE 93 07/31/2017   CHOL 153 04/17/2017   TRIG 92.0 04/17/2017   HDL 79.60 04/17/2017   LDLCALC 55 04/17/2017   ALT 12 04/17/2017   AST 14 04/17/2017   NA 139 07/31/2017   K 3.7 07/31/2017   CL 99 07/31/2017   CREATININE 0.85 07/31/2017   BUN 15 07/31/2017   CO2 29 07/31/2017   TSH 1.49 04/17/2017   HGBA1C 5.6 04/17/2017   MICROALBUR 1.6 04/26/2011    ASSESSMENT AND PLAN:  Discussed the following assessment and plan:  Prediabetes - Plan: Basic metabolic panel, CBC with Differential/Platelet, Hemoglobin A1c, Hepatic function panel, Lipid panel  Medication management - Plan: Basic metabolic panel, CBC with Differential/Platelet, Hemoglobin A1c, Hepatic function panel, Lipid panel  Sleep disturbance - Plan: Basic metabolic panel, CBC with Differential/Platelet, Hemoglobin A1c, Hepatic function  panel, Lipid panel  Fasting hyperglycemia - Plan: Basic metabolic panel, CBC with Differential/Platelet, Hemoglobin A1c, Hepatic function panel, Lipid panel  Anxiety state - Plan: Basic metabolic panel, CBC with Differential/Platelet, Hemoglobin A1c, Hepatic function panel, Lipid panel  Depression, major, in remission ([place])? - continue medication fatigue seems to be from other factors  Meniere's disease, unspecified laterality   due for lab  Latest by  July  Lipids  a1c last April  Needs monitoring  At safe time  She can get appt  Fatigue and sleepiness certainly related to  osa  Difficulty rx   But her affect is no longer depressed when talking with her today .  Seems brighter . Advise some walking and exercise ( her husband  walks miles and told she could just do 15 minutes   Expectant management and discussion of plan and treatment with patient with opportunity to ask questions and all were answered. The patient agreed with the plan and demonstrated an understanding of the instructions.   The patient was advised to call back or seek an in-person evaluation if worsening having concerns    or if the condition fails to improve as anticipated. Total visit 12mins > 50% spent counseling and coordinating care as indicated in above note and in instructions to patient .   Shanon Ace, MD

## 2018-04-22 ENCOUNTER — Ambulatory Visit (INDEPENDENT_AMBULATORY_CARE_PROVIDER_SITE_OTHER): Payer: Medicare Other | Admitting: Internal Medicine

## 2018-04-22 ENCOUNTER — Other Ambulatory Visit: Payer: Self-pay

## 2018-04-22 ENCOUNTER — Encounter: Payer: Self-pay | Admitting: Internal Medicine

## 2018-04-22 DIAGNOSIS — F411 Generalized anxiety disorder: Secondary | ICD-10-CM | POA: Diagnosis not present

## 2018-04-22 DIAGNOSIS — Z79899 Other long term (current) drug therapy: Secondary | ICD-10-CM

## 2018-04-22 DIAGNOSIS — R7303 Prediabetes: Secondary | ICD-10-CM

## 2018-04-22 DIAGNOSIS — R7301 Impaired fasting glucose: Secondary | ICD-10-CM | POA: Diagnosis not present

## 2018-04-22 DIAGNOSIS — F325 Major depressive disorder, single episode, in full remission: Secondary | ICD-10-CM

## 2018-04-22 DIAGNOSIS — G479 Sleep disorder, unspecified: Secondary | ICD-10-CM | POA: Diagnosis not present

## 2018-04-22 DIAGNOSIS — H8109 Meniere's disease, unspecified ear: Secondary | ICD-10-CM

## 2018-04-22 MED ORDER — GLUCOSE BLOOD VI STRP: ORAL_STRIP | 4 refills | Status: DC

## 2018-05-03 ENCOUNTER — Other Ambulatory Visit: Payer: Self-pay | Admitting: Internal Medicine

## 2018-05-07 ENCOUNTER — Ambulatory Visit: Payer: Medicare Other | Admitting: Pulmonary Disease

## 2018-05-29 ENCOUNTER — Other Ambulatory Visit (INDEPENDENT_AMBULATORY_CARE_PROVIDER_SITE_OTHER): Payer: Medicare Other

## 2018-05-29 ENCOUNTER — Other Ambulatory Visit: Payer: Self-pay | Admitting: Internal Medicine

## 2018-05-29 ENCOUNTER — Other Ambulatory Visit: Payer: Self-pay

## 2018-05-29 DIAGNOSIS — Z79899 Other long term (current) drug therapy: Secondary | ICD-10-CM

## 2018-05-29 DIAGNOSIS — R7301 Impaired fasting glucose: Secondary | ICD-10-CM | POA: Diagnosis not present

## 2018-05-29 DIAGNOSIS — G479 Sleep disorder, unspecified: Secondary | ICD-10-CM

## 2018-05-29 DIAGNOSIS — R7303 Prediabetes: Secondary | ICD-10-CM

## 2018-05-29 DIAGNOSIS — F411 Generalized anxiety disorder: Secondary | ICD-10-CM | POA: Diagnosis not present

## 2018-05-29 LAB — HEPATIC FUNCTION PANEL
ALT: 14 U/L (ref 0–35)
AST: 14 U/L (ref 0–37)
Albumin: 4.4 g/dL (ref 3.5–5.2)
Alkaline Phosphatase: 66 U/L (ref 39–117)
Bilirubin, Direct: 0.2 mg/dL (ref 0.0–0.3)
Total Bilirubin: 0.7 mg/dL (ref 0.2–1.2)
Total Protein: 6.5 g/dL (ref 6.0–8.3)

## 2018-05-29 LAB — CBC WITH DIFFERENTIAL/PLATELET
Basophils Absolute: 0.1 10*3/uL (ref 0.0–0.1)
Basophils Relative: 1.1 % (ref 0.0–3.0)
Eosinophils Absolute: 0.2 10*3/uL (ref 0.0–0.7)
Eosinophils Relative: 3.4 % (ref 0.0–5.0)
HCT: 40.7 % (ref 36.0–46.0)
Hemoglobin: 13.8 g/dL (ref 12.0–15.0)
Lymphocytes Relative: 27.8 % (ref 12.0–46.0)
Lymphs Abs: 1.9 10*3/uL (ref 0.7–4.0)
MCHC: 33.9 g/dL (ref 30.0–36.0)
MCV: 86.4 fl (ref 78.0–100.0)
Monocytes Absolute: 0.5 10*3/uL (ref 0.1–1.0)
Monocytes Relative: 6.7 % (ref 3.0–12.0)
Neutro Abs: 4.2 10*3/uL (ref 1.4–7.7)
Neutrophils Relative %: 61 % (ref 43.0–77.0)
Platelets: 275 10*3/uL (ref 150.0–400.0)
RBC: 4.71 Mil/uL (ref 3.87–5.11)
RDW: 14.7 % (ref 11.5–15.5)
WBC: 6.9 10*3/uL (ref 4.0–10.5)

## 2018-05-29 LAB — BASIC METABOLIC PANEL
BUN: 14 mg/dL (ref 6–23)
CO2: 30 mEq/L (ref 19–32)
Calcium: 9.4 mg/dL (ref 8.4–10.5)
Chloride: 99 mEq/L (ref 96–112)
Creatinine, Ser: 0.88 mg/dL (ref 0.40–1.20)
GFR: 63.12 mL/min (ref >60.00)
Glucose, Bld: 82 mg/dL (ref 70–99)
Potassium: 3.7 mEq/L (ref 3.5–5.1)
Sodium: 139 mEq/L (ref 135–145)

## 2018-05-29 LAB — LIPID PANEL
Cholesterol: 156 mg/dL (ref 0–200)
HDL: 81.7 mg/dL (ref >39.00)
LDL Cholesterol: 61 mg/dL (ref 0–99)
NonHDL: 74.22
Total CHOL/HDL Ratio: 2
Triglycerides: 68 mg/dL (ref 0.0–149.0)
VLDL: 13.6 mg/dL (ref 0.0–40.0)

## 2018-05-29 LAB — HEMOGLOBIN A1C: Hgb A1c MFr Bld: 5.7 % (ref 4.6–6.5)

## 2018-08-06 ENCOUNTER — Other Ambulatory Visit: Payer: Self-pay | Admitting: Internal Medicine

## 2018-08-15 DIAGNOSIS — H40013 Open angle with borderline findings, low risk, bilateral: Secondary | ICD-10-CM | POA: Diagnosis not present

## 2018-08-15 DIAGNOSIS — H0102B Squamous blepharitis left eye, upper and lower eyelids: Secondary | ICD-10-CM | POA: Diagnosis not present

## 2018-08-15 DIAGNOSIS — H25013 Cortical age-related cataract, bilateral: Secondary | ICD-10-CM | POA: Diagnosis not present

## 2018-08-15 DIAGNOSIS — H2513 Age-related nuclear cataract, bilateral: Secondary | ICD-10-CM | POA: Diagnosis not present

## 2018-08-27 ENCOUNTER — Other Ambulatory Visit: Payer: Self-pay | Admitting: Internal Medicine

## 2018-09-09 ENCOUNTER — Other Ambulatory Visit: Payer: Self-pay | Admitting: Internal Medicine

## 2018-09-09 DIAGNOSIS — Z1231 Encounter for screening mammogram for malignant neoplasm of breast: Secondary | ICD-10-CM

## 2018-09-30 ENCOUNTER — Other Ambulatory Visit: Payer: Self-pay | Admitting: Internal Medicine

## 2018-10-03 ENCOUNTER — Encounter: Payer: Self-pay | Admitting: Internal Medicine

## 2018-10-03 DIAGNOSIS — H2512 Age-related nuclear cataract, left eye: Secondary | ICD-10-CM | POA: Diagnosis not present

## 2018-10-03 DIAGNOSIS — H35033 Hypertensive retinopathy, bilateral: Secondary | ICD-10-CM | POA: Diagnosis not present

## 2018-10-03 DIAGNOSIS — H2513 Age-related nuclear cataract, bilateral: Secondary | ICD-10-CM | POA: Diagnosis not present

## 2018-10-03 DIAGNOSIS — H35363 Drusen (degenerative) of macula, bilateral: Secondary | ICD-10-CM | POA: Diagnosis not present

## 2018-10-03 DIAGNOSIS — H25013 Cortical age-related cataract, bilateral: Secondary | ICD-10-CM | POA: Diagnosis not present

## 2018-10-21 ENCOUNTER — Other Ambulatory Visit: Payer: Self-pay

## 2018-10-21 ENCOUNTER — Encounter: Payer: Self-pay | Admitting: Psychology

## 2018-10-21 ENCOUNTER — Ambulatory Visit: Payer: Medicare Other | Admitting: Psychology

## 2018-10-21 ENCOUNTER — Ambulatory Visit (INDEPENDENT_AMBULATORY_CARE_PROVIDER_SITE_OTHER): Payer: Medicare Other | Admitting: Psychology

## 2018-10-21 DIAGNOSIS — F330 Major depressive disorder, recurrent, mild: Secondary | ICD-10-CM

## 2018-10-21 DIAGNOSIS — F0670 Mild neurocognitive disorder due to known physiological condition without behavioral disturbance: Secondary | ICD-10-CM

## 2018-10-21 DIAGNOSIS — G3184 Mild cognitive impairment, so stated: Secondary | ICD-10-CM | POA: Diagnosis not present

## 2018-10-21 DIAGNOSIS — R413 Other amnesia: Secondary | ICD-10-CM

## 2018-10-21 DIAGNOSIS — F411 Generalized anxiety disorder: Secondary | ICD-10-CM | POA: Diagnosis not present

## 2018-10-21 NOTE — Progress Notes (Signed)
NEUROPSYCHOLOGICAL EVALUATION [place] of Neurology  Reason for Referral:   PIERCE CRAKER is a 73 y.o. Caucasian female referred by [name], M.D., to characterize her current cognitive functioning and assist with diagnostic clarity and treatment planning in the context of subjective cognitive decline, reported family history of dementia, and several medical and psychiatric comorbidities.  Assessment and Plan:   Clinical Impression(s): Overall, Ms. Sawada pattern of performance is suggestive of a consistent weakness across verbal fluency tasks, as well as notable performance variability across several cognitive domains, including processing speed, executive functioning, and encoding (i.e., learning) aspects of memory. Performance across visuospatial tasks declined with increased complexity, likely due to the presence of bilateral cataracts and ongoing visual acuity difficulties. Performance across domains of attention/concentration, receptive language, confrontation naming, basic visuospatial abilities, and retrieval and consolidation aspects of memory were consistently within normal limits relative to estimated premorbid intellectual abilities. 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   The etiology of her cognitive difficulties is multifactorial in nature. Factors which can create and maintain cognitive inefficiencies include significant psychiatric distress (i.e., severe rates of acute anxiety and mild rates of acute depression), chronic pain due to fibromyalgia, and variable sleep disturbances. The combination of these difficulties can certainly impact cognitive functioning and cause performance variability in the specific domains described above. Overall, it is certainly  possible that a combination of these factors are largely responsible for day-to-day cognitive difficulties which Ms. Bort has been experiencing presently. As such, improved management of these symptoms could yield improvements in Ms. Vukelich's cognitive functioning over time.   Recommendations: Repeat neuropsychological evaluation in 12-18 months (or sooner if functional decline is noted) is recommended to assess the trajectory of future cognitive decline should it occur.  A combination of medication and psychotherapy has been shown to be most effective at treating symptoms of anxiety and depression. As such, Ms. Downes is encouraged to speak with her prescribing physician regarding medication adjustments to optimally manage these symptoms. Likewise, Ms. Colvert is encouraged to consider engaging in short-term psychotherapy to address symptoms of psychiatric distress. She would benefit from an active and collaborative therapeutic environment, rather than one purely supportive in nature. Recommended treatment modalities include Cognitive Behavioral Therapy (CBT) or Acceptance and Commitment Therapy (ACT).  Optimal control of vascular risk factors (including safe cardiovascular exercise and adherence to dietary recommendations) is encouraged. Likewise, continued compliance with her CPAP machine will also be important. She is encouraged to reach out to the physician who prescribed her CPAP machine to discuss options for obtaining a mask with a more satisfactory fit to ensure maximum benefit from this intervention.  Continued participation in activities which provide mental stimulation and social interaction is also recommended.   When learning new information, she would benefit from information being broken up into small, manageable pieces. She may also find it helpful to articulate the material in her own words and in a context to promote encoding at the onset of a new task. This material may need to be  repeated multiple times to promote encoding.  To address problems with fluctuating attention, she may wish to consider:   -Avoiding external distractions when needing to concentrate   -Writing down complicated information and using checklists   -Attempting and completing one task at a time (i.e., no multi-tasking)   -  Reducing the amount of information considered at one time   Reducing anxiety may also aid in the retrieval of information. Ms. Sze is encouraged to prepare scripts she can use socially when she experiences difficulty with word finding or memory. Such scripts should be brief explanations of the difficulty (e.g., "the word escapes me now") and allow her to move the conversation forward quickly rather than dwelling on the issue.  Review of Records:   Ms. Raser was seen by [facility] [name], M.D.) on 03/12/2018 for follow-up of worsening memory. Specifically, Ms. Huffstutler reported trouble remembering names, as well as word finding difficulties (e.g., must stop mid way through a conversation due to her not being able to remember the word she intends to use). She also has a history of obstructive sleep apnea and is on CPAP/BiPAP currently. However, Ms. Stuve reported trouble with her mask. ADLs were generally described as intact. However, she did note occasional instances in which she will get distracted and forget if she previously took her medication. Performance on previous cognitive screening instruments (MMSE) were within normal limits (28/30 in February 2019, 29/30 in February 2018, and 30/30 in February 2017). Likewise, performance during the current visit was also within normal limits (28/30). Ultimately, Ms. Glueckert was referred for a comprehensive neuropsychological evaluation to characterize her cognitive abilities and to assist with diagnostic clarity and treatment planning.  Brain MRI in August 2016 revealed mild diffuse atrophy, similar to a previous MRI in 2012.   Past  Medical History:  Diagnosis Date   Allergy    Anxiety    Arthritis    Blood transfusion without reported diagnosis    Cataract    Bilateral   Diabetes mellitus without complication (HCC)    Pre-diabetic, no meds, diet controlled   Diverticulosis of colon    Enteritis    Erythema nodosum    Tends to be seasonal has had a negative chest x-ray in the past negative PPD   Fibromyalgia    Generalized anxiety disorder 09/01/2014   GERD (gastroesophageal reflux disease)    History of stricture and dilatation and Nissen surgery   Hypertension    IBS (irritable bowel syndrome)    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   Qualifier: Diagnosis of  By: Regis Bill MD, Standley Brooking    Obstructive sleep apnea    Osteopenia     Past Surgical History:  Procedure Laterality Date   ABDOMINAL HYSTERECTOMY     bone spur     CESAREAN SECTION     x 1   CHOLECYSTECTOMY     COLONOSCOPY     ESOPHAGOGASTRODUODENOSCOPY     stricture and dilatation   left shoulder surgery  06/08, 07/12/17   x 2   NISSEN FUNDOPLICATION     ROTATOR CUFF REPAIR Left    rt knee surgery     scope   UPPER GASTROINTESTINAL ENDOSCOPY      Family History  Problem Relation Age of Onset   Depression Mother    Kidney cancer Mother    Dementia Mother        Unspecified, likely secondary to other medical condition (cancer)   Depression Father    Hiatal hernia Father    COPD Sister    Dementia Sister        Unspecified, likely secondary to other medical condition   Nephrolithiasis Other    Colon cancer Neg Hx    Esophageal cancer Neg Hx    Pancreatic cancer Neg Hx  Rectal cancer Neg Hx    Stomach cancer Neg Hx      Current Outpatient Medications:    calcium carbonate (TUMS EX) 750 MG chewable tablet, Chew 1 tablet by mouth as needed for heartburn., Disp: , Rfl:    cholecalciferol (VITAMIN D) 1000 UNITS tablet, Take 2,000 Units by mouth daily. , Disp: , Rfl:    dicyclomine (BENTYL)  10 MG capsule, TAKE 1 CAPSULE BY MOUTH EVERY 8 HOURS AS NEEDED FOR SPASMS, Disp: 90 capsule, Rfl: 11   escitalopram (LEXAPRO) 10 MG tablet, Take 1 tablet by mouth once daily, Disp: 90 tablet, Rfl: 0   esomeprazole (NEXIUM) 20 MG capsule, Take 20 mg by mouth daily at 12 noon. Reported on 03/08/2015, Disp: , Rfl:    glucose blood (FREESTYLE LITE) test strip, USE TO CHECK BLOOD SUGAR DAILY AND AS NEEDED, Disp: 100 each, Rfl: 4   hydrochlorothiazide (HYDRODIURIL) 12.5 MG tablet, Take 1 tablet by mouth once daily, Disp: 30 tablet, Rfl: 0   Lancets (FREESTYLE) lancets, USE TO CHECK BLOOD SUGAR DAILY AND AS NEEDED, Disp: 100 each, Rfl: 4   potassium chloride (K-DUR) 10 MEQ tablet, TAKE 1 TABLET BY MOUTH EVERY OTHER DAY WITH FLUID TABLET, Disp: 45 tablet, Rfl: 0  Clinical Interview:   Cognitive Symptoms: Decreased short-term memory: Endorsed. Provided examples included difficulties with names and remembering details of previous events. These were said to be noticeable for the previous 3 years and have been relatively stable over that time.  Decreased long-term memory: Denied. Decreased attention/concentration: Endorsed. Ms. Gieck noted ongoing difficulties with maintaining her focus, increased ease of distractibility, and losing her train of thought.  Reduced processing speed: Endorsed. Difficulties were largely attributed to symptoms of "brain fog" stemming from her history of fibromyalgia.  Difficulties with executive functions: Endorsed. Difficulties with organization were reported, attributed to instances where she gets distracted more easily. She also noted trouble with indecisiveness in which she feels less confident in her decision making. Trouble with impulsivity or poor judgment was denied.  Difficulties with emotion regulation: Denied. However, she did endorse some personality changes in the form of being more quiet due to fears of engaging in a conversation and experiencing word finding  difficulties.  Difficulties with receptive language: Denied. Difficulties with word finding: Endorsed. In addition to trouble remembering the names of individuals, she reported frequently experiencing a tip-of-the-tongue phenomenon.  Decreased visuoperceptual ability: Endorsed. Trouble with depth perception was endorsed and notably contributed to a fall which occurred last year in which Ms. Huinker tripped over a curb while trying to enter a restaurant, leading to a shoulder injury requiring surgery.   Trajectory of deficits: As stated above, memory difficulties were noted to be present for the past 3 years and were described as generally stable. Other abilities were likewise described as stable overall and impacted by external sources (e.g., pain, brain fog).   Difficulties completing ADLs: Largely denied. Basic ADLs were intact. Ms. Yohman noted occasionally forgetting to take her medications despite the use of a pillbox. Her husband manages their personal finances. Difficulties with driving were denied.   Additional Medical History: History of traumatic brain injury/concussion: Denied. History of stroke: Denied. History of seizure activity: Denied. History of known exposure to toxins: Denied. Symptoms of chronic pain: Endorsed. Ms. Robicheaux reported a history of fibromyalgia. She described her baseline level of pain as an 8/10 in the morning after first awakening. These levels were said to diminish as the day progresses, but always remain to an extent  to where they are functionally limiting. She denied current medication intervention for pain-related symptoms.  Experience of frequent headaches/migraines: Endorsed. She reported commonly waking up with a mild headache. However, symptoms are managed appropriately via OTC oral medications.  Frequent instances of dizziness/vertigo: Denied.  Sensory changes: Endorsed. Ms. Gaur reported a history of bilateral cataracts; she noted being scheduled for left  eye cataract surgery on 11/05/2018. She wears corrective lenses; however, vision is often obscured due to her needing to wear a face mask (COVID-19 pandemic) and her breath commonly fogging the lenses. Outside of vision, she reported her perception that she has experienced mild hearing loss; however, her most recent hearing evaluation was said to have occurred several years ago. No other sensory changes/difficulties (e.g., taste or smell) were reported.  Balance/coordination difficulties: Endorsed. Balance concerns were described as mild and contributory to her history of falls (although, to a lesser extent than depth perception difficulties). She acknowledged a history of falls, including 1 last year which required shoulder surgery. No falls since that instance were reported.  Other motor difficulties: Denied.  Sleep History: Estimated hours obtained each night: 4-10 hours depending on experienced difficulties falling asleep. Difficulties falling asleep: Endorsed. These were said to occur sporadically and were at least partially attributed to her husband's snoring.  Difficulties staying asleep: Denied with the assistance of oral medications and her CPAP machine.  Feels rested and refreshed upon awakening: Denied.  History of snoring: Endorsed. History of waking up gasping for air: Endorsed. Witnessed breath cessation while asleep: Endorsed. Ms. Lecker acknowledged a history of obstructive sleep apnea. She currently uses her CPAP machine regularly, but endorsed difficulties with her mask. Specifically, she noted not being able to achieve a solid fit, stating that she is likely in-between sizes (e.g., too large for the smaller size but too small for a larger size).   History of vivid dreaming: Denied. Excessive movement while asleep: Denied. Instances of acting out her dreams: Denied.  Psychiatric/Behavioral Health History: Depression: Endorsed. Ms. Bartles described a longstanding history of  depression, dating back at least 10 years. She described her current mood as "flat" and attributed much of this to the current state of the world. She reported utilizing oral mood-related medications with positive effect. She also reported prior involvement in individual psychotherapy, which was mildly beneficial.  Anxiety: Endorsed. Same description as above.  Mania: Denied. Trauma History: Denied. Visual/auditory hallucinations: Denied. Delusional thoughts: Denied.  Tobacco: Denied. Alcohol: Ms. Schorer denied current alcohol consumption, as well as a history of problematic alcohol use, abuse, or dependence.   Recreational drugs: Denied. Caffeine: Endorsed. She reported consuming approximately 2 caffeinated soft drinks per day.   Academic/Vocational History: Highest level of educational attainment: 15 years. Ms. Flicker graduated from high school and completed an additional 3 years at [employer] college working towards an Biomedical scientist. She described herself as a strong (mostly A) student in academic settings.  History of developmental delay: Denied. History of grade repetition: Denied. History of class failures: Denied. Enrollment in special education courses: Endorsed. She reported being enrolled in speech courses in early academic settings due to being born tongue-tied and having her tongue clipping procedure shortly before starting school. 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 History of diagnosed specific learning disability: Denied. History of ADHD: Denied.  Employment: Retired. 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  Evaluation Results:   Behavioral Observations: Ms. Graper was unaccompanied, arrived to  her appointment on time, and was appropriately dressed and groomed. Observed gait and station were within normal limits. Gross motor functioning  appeared intact upon informal observation and no abnormal movements (e.g., tremors) were noted. Her affect was generally relaxed and positive, but did range appropriately given the subject being discussed during the clinical interview. She appeared mildly nervous during the start of testing procedures, but this improved as the evaluation progressed. Spontaneous speech was fluent and word finding difficulties were not observed during the clinical interview or testing procedures. Sustained attention was appropriate throughout. Thought processes were coherent, organized, and normal in content. Task engagement was adequate and she persisted well when challenged. Overall, Ms. Stgelais was cooperative with the clinical interview and subsequent testing procedures. Performance across a single task (D-KEFS 20 Questions) was discontinued due to Ms. Mccombie not understanding task instructions.  Adequacy of Effort: The validity of neuropsychological testing is limited by the extent to which the individual being tested may be assumed to have exerted adequate effort during testing. Ms. Mcdaid expressed her intention to perform to the best of her abilities and exhibited adequate task engagement and persistence. Scores across stand-alone and embedded performance validity measures were variable. However, performance across a single stand-alone measure which scored below expectation (Dot Counting) may have been affected by ongoing visual acuity difficulties caused by Ms. Auston's bilateral cataracts. As all other performance validity indicators were within expectation, the results of the current evaluation are believed to be a valid representation of Ms. Sherwood's current cognitive functioning.  Test Results: Ms. Olbrich was fully oriented at the time of the current evaluation.  Intellectual abilities based upon educational and vocational attainment were estimated to be in the average range. Premorbid abilities were estimated to be  within the below average range based upon a single-word reading test.   Processing speed was variable, ranging from the well below average to average normative ranges. Basic attention was below average. More complex attention (e.g., working memory) was below average. Performance across tasks assessing executive functions (e.g., cognitive flexibility, response inhibition, nonverbal abstract reasoning, analytical problem solving) were variable, ranging from the well below average to average normative ranges. Performance across a single task (D-KEFS 20 Questions) was discontinued due to Ms. Pavlik not understanding task instructions.  While not directly assessed, receptive language abilities are believed to be intact as Ms. Germond did not exhibit any difficulties comprehending task instructions and answered all questions asked of her appropriately. Assessed expressive language (e.g., verbal fluency and confrontation naming) was variable. Confrontation naming was within normal limits, while phonemic fluency was well below average and semantic fluency was exceptionally low.     Assessed visuospatial/visuoconstructional abilities were within normal limits across basic tasks. However, difficulties emerged during more complex tasks, potentially related to ongoing visual disturbances stemming from the presence of bilateral cataracts.    Learning (i.e., encoding) of novel verbal and visual information was variable, ranging from the well below average to average normative ranges. Spontaneous delayed recall (i.e., retrieval) of previously learned information was within normal limits. Retention rates were strong across memory measures. Performance across recognition tasks was likewise strong, suggesting evidence for appropriate information consolidation.   Results of emotional screening instruments suggested that recent symptoms of generalized anxiety were in the severe range, while symptoms of depression were within the  mild range. A screening instrument assessing recent sleep quality suggested the presence of minimal sleep dysfunction.  Tables of Scores:   Note: This summary of test scores accompanies the interpretive report  and should not be considered in isolation without reference to the appropriate sections in the text. Descriptors are based on appropriate normative data and may be adjusted based on clinical judgment. The terms impaired and within normal limits (WNL) are used when a more specific level of functioning cannot be determined.       Effort Testing:   DESCRIPTOR       ACS Word Choice: --- --- Within Expectation    *Based on 73 y/o normative data     Dot Counting Test: --- --- Below Expectation  CVLT-III Brief Form Forced Choice Recognition: --- --- Within Expectation  BVMT-R Retention Percentage: --- --- Within Expectation       Orientation:      Raw Score Percentile   NAB Orientation, Form 1 29/29 71 Average       Intellectual Functioning:           Standard Score Percentile   Test of Premorbid Functioning: 89 23 Below Average       Memory:          Wechsler Memory Scale (WMS-IV):                       Raw Score (Scaled Score) Percentile     Logical Memory I 25/53 (8) 25 Average    Logical Memory II 15/39 (9) 37 Average    Logical Memory Recognition 20/23 >75 Above Average       [place] Verbal Learning Test (CVLT-III) Brief Form: Raw Score (Scaled/Standard Score) Percentile     Total Trials 1-4 19/36 (77) 6 Well Below Average    Short-Delay Free Recall 5/9 (6) 9 Below Average    Long-Delay Free Recall 5/9 (7) 16 Below Average    Long-Delay Cued Recall 5/9 (6) 9 Below Average      Recognition Hits 9/9 (13) 84 Above Average      False Positive Errors 0 (12) 75 Above Average       Brief Visuospatial Memory Test (BVMT-R), Form 1: Raw Score (T Score) Percentile     Total Trials 1-3 15/36 (40) 16 Below Average    Delayed Recall 8/12 (50) 50 Average    Recognition  Discrimination Index 5 >16 Within Normal Limits      Recognition Hits 6/6 >16 Within Normal Limits      False Positive Errors 1 11-16 Below Average        Attention/Executive Function:          Trail Making Test (TMT): Raw Score (T Score) Percentile     Part A 62 secs., 0 errors (35) 7 Well Below Average    Part B 169 secs.,  0 errors (33) 5 Well Below Average        Scaled Score Percentile   WAIS-IV Coding: 9 37 Average       NAB Attention Module, Form 1: T Score Percentile     Digits Forward 41 18 Below Average    Digits Backwards 37 9 Below Average       D-KEFS Color-Word Interference Test: Raw Score (Scaled Score) Percentile     Color Naming 39 secs. (8) 25 Average    Word Reading 27 secs. (9) 37 Average    Inhibition 99 secs. (6) 9 Below Average      Total Errors 4 errors (9) 37 Average    Inhibition/Switching 100 secs. (7) 16 Below Average      Total Errors 6 errors (7) 16 Below Average  D-KEFS 20 Questions Test: Scaled Score Percentile     Total Weighted Achievement Score Discontinued --- Impaired    Initial Abstraction Score --- --- ---       Language:          Verbal Fluency Test: Raw Score (T Score) Percentile     Phonemic Fluency (FAS) 25 (34) 5 Well Below Average    Animal Fluency 8 (22) <1 Exceptionally Low       NAB Language Module, Form 1: T Score Percentile     Naming 59 82 Above Average       Visuospatial/Visuoconstruction:      Raw Score Percentile   Clock Drawing: 9/10 --- Within Normal Limits       NAB Spatial Module, Form 1: T Score Percentile     Figure Drawing Copy 29 2 Exceptionally Low        Scaled Score Percentile   WAIS-IV Matrix Reasoning: 11 63 Average       Mood and Personality:      Raw Score Percentile   Geriatric Depression Scale: 17 --- Mild  Geriatric Anxiety Scale: 43 --- Severe    Somatic 15 --- Severe    Cognitive 11 --- Severe    Affective 17 --- Severe       Additional Questionnaires:      Raw Score Percentile    PROMIS Sleep Disturbance Questionnaire: 24 --- None to Slight   Informed Consent and Coding/Compliance:   Ms. Blaustein was provided with a verbal description of the nature and purpose of the present neuropsychological evaluation. Also reviewed were the foreseeable risks and/or discomforts and benefits of the procedure, limits of confidentiality, and mandatory reporting requirements of this provider. The patient was given the opportunity to ask questions and receive answers about the evaluation. Oral consent to participate was provided by the patient.   This evaluation was conducted by Christia Reading, Ph.D., licensed clinical neuropsychologist. Ms. Baumel completed a 30-minute clinical interview, billed as one unit [phone], and 105 minutes of cognitive testing, billed as one unit [phone] and three additional units 301 [id number]. Psychometrist Milana Kidney, B.S., assisted Dr. Melvyn Novas with test administration and scoring procedures. As a separate and discrete service, Dr. Melvyn Novas spent a total of 180 minutes in interpretation and report writing, billed as one unit 96132 and two units 96133.

## 2018-10-21 NOTE — Progress Notes (Signed)
   Neuropsychology Note   Caitlin [name] completed 90 minutes of neuropsychological testing with technician, Milana Kidney, B.S., under the supervision of Dr. Christia Reading, Ph.D., licensed neuropsychologist. The patient did not appear overtly distressed by the testing session, per behavioral observation or via self-report to the technician. Rest breaks were offered.    In considering the patient's current level of functioning, level of presumed impairment, nature of symptoms, emotional and behavioral responses during the interview, level of literacy, and observed level of motivation/effort, a battery of tests was selected and communicated to the psychometrician.   Communication between the psychologist and technician was ongoing throughout the testing session and changes were made as deemed necessary based on patient performance on testing, technician observations and additional pertinent factors such as those listed above.   Caitlin [name] will return within approximately two weeks for an interactive feedback session with Dr. Melvyn Novas at which time his test performances, clinical impressions, and treatment recommendations will be reviewed in detail. The patient understands she can contact our office should she require our assistance before this time.   Full report to follow.  90 minutes were spent face-to-face with patient administering standardized tests and 15 minutes were spent scoring (technician). [CPT [id number], [id number]

## 2018-10-22 ENCOUNTER — Encounter: Payer: Self-pay | Admitting: Psychology

## 2018-10-22 ENCOUNTER — Ambulatory Visit
Admission: RE | Admit: 2018-10-22 | Discharge: 2018-10-22 | Disposition: A | Discharge status: Home / Self Care | Payer: Medicare Other | Source: Ambulatory Visit | Attending: Internal Medicine | Admitting: Internal Medicine

## 2018-10-22 DIAGNOSIS — G3184 Mild cognitive impairment, so stated: Secondary | ICD-10-CM

## 2018-10-22 DIAGNOSIS — Z1231 Encounter for screening mammogram for malignant neoplasm of breast: Secondary | ICD-10-CM

## 2018-10-22 HISTORY — DX: Mild cognitive impairment of uncertain or unknown etiology: G31.84

## 2018-10-28 ENCOUNTER — Other Ambulatory Visit: Payer: Self-pay | Admitting: Internal Medicine

## 2018-10-29 ENCOUNTER — Encounter: Payer: Medicare Other | Admitting: Psychology

## 2018-11-01 ENCOUNTER — Telehealth: Payer: Self-pay

## 2018-11-01 NOTE — Telephone Encounter (Signed)
This was taken care  Of

## 2018-11-01 NOTE — Telephone Encounter (Signed)
Copied from [place] [id number]. Topic: General - Inquiry >> Oct 31, 2018  4:15 PM Alease Frame wrote: Reason for CRM: Patient called in and would like to speak with a nurse or Dr about her result on mychart  Call back number [phone]

## 2018-11-05 DIAGNOSIS — H25812 Combined forms of age-related cataract, left eye: Secondary | ICD-10-CM | POA: Diagnosis not present

## 2018-11-05 DIAGNOSIS — H2512 Age-related nuclear cataract, left eye: Secondary | ICD-10-CM | POA: Diagnosis not present

## 2018-11-09 ENCOUNTER — Other Ambulatory Visit: Payer: Self-pay | Admitting: Internal Medicine

## 2018-11-11 ENCOUNTER — Encounter: Payer: Medicare Other | Admitting: Psychology

## 2018-11-14 ENCOUNTER — Other Ambulatory Visit: Payer: Self-pay

## 2018-11-14 ENCOUNTER — Ambulatory Visit (INDEPENDENT_AMBULATORY_CARE_PROVIDER_SITE_OTHER): Payer: Medicare Other | Admitting: Psychology

## 2018-11-14 ENCOUNTER — Encounter: Payer: Self-pay | Admitting: Psychology

## 2018-11-14 DIAGNOSIS — F0670 Mild neurocognitive disorder due to known physiological condition without behavioral disturbance: Secondary | ICD-10-CM

## 2018-11-14 DIAGNOSIS — G3184 Mild cognitive impairment, so stated: Secondary | ICD-10-CM

## 2018-11-14 NOTE — Progress Notes (Signed)
   Neuropsychology Feedback Session Tillie Rung. [facility] Department of Neurology  Reason for Referral:   Caitlin [name] a 73 y.o. Caucasian female referred by [name], M.D.,to characterize hercurrent cognitive functioning and assist with diagnostic clarity and treatment planning in the context of subjective cognitive decline, reported family history of dementia, and several medical and psychiatric comorbidities.  Feedback:   Caitlin [name] completed a comprehensive neuropsychological evaluation on 10/21/2018. Please refer to that encounter for the full report. Briefly, results suggested consistent weakness across verbal fluency tasks, as well as notable performance variability across several cognitive domains, including processing speed, executive functioning, and encoding (i.e., learning) aspects of memory. Performance across visuospatial tasks declined with increased complexity, likely due to the presence of bilateral cataracts and ongoing visual acuity difficulties. 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 Factors which can create and maintain cognitive inefficiencies include significant psychiatric distress ( she reported severe rates of acute anxiety and mild rates of acute depression), chronic pain due to fibromyalgia, and variable sleep disturbances. Improved management of these symptoms could yield improvements in Caitlin [name]'s cognitive functioning over time. Recommendations included a repeat evaluation in 12-18 months and continued efforts to optimize mood-related concerns.  Caitlin [name] was accompanied by her husband. Content of the current session focused on the results of the current evaluation. While not stated directly in the initial report, Alzheimer's disease is not suspected at this time. Caitlin [name] and her husband  were given the opportunity to ask questions and their questions were answered. They were also encouraged to reach out should additional questions arise. A copy of her report was provided at the conclusion of the visit.      A total of 20 minutes were spent with Caitlin [name] during the current feedback session.

## 2018-11-25 ENCOUNTER — Other Ambulatory Visit: Payer: Self-pay | Admitting: Internal Medicine

## 2018-12-19 DIAGNOSIS — H25011 Cortical age-related cataract, right eye: Secondary | ICD-10-CM | POA: Diagnosis not present

## 2018-12-19 DIAGNOSIS — H2511 Age-related nuclear cataract, right eye: Secondary | ICD-10-CM | POA: Diagnosis not present

## 2018-12-24 DIAGNOSIS — H25811 Combined forms of age-related cataract, right eye: Secondary | ICD-10-CM | POA: Diagnosis not present

## 2018-12-24 DIAGNOSIS — H2511 Age-related nuclear cataract, right eye: Secondary | ICD-10-CM | POA: Diagnosis not present

## 2018-12-24 DIAGNOSIS — H52211 Irregular astigmatism, right eye: Secondary | ICD-10-CM | POA: Diagnosis not present

## 2018-12-24 DIAGNOSIS — H25011 Cortical age-related cataract, right eye: Secondary | ICD-10-CM | POA: Diagnosis not present

## 2018-12-26 ENCOUNTER — Other Ambulatory Visit: Payer: Self-pay | Admitting: Internal Medicine

## 2019-01-22 ENCOUNTER — Telehealth (INDEPENDENT_AMBULATORY_CARE_PROVIDER_SITE_OTHER): Payer: Medicare Other | Admitting: Pulmonary Disease

## 2019-01-22 ENCOUNTER — Encounter: Payer: Self-pay | Admitting: Pulmonary Disease

## 2019-01-22 DIAGNOSIS — Z9989 Dependence on other enabling machines and devices: Secondary | ICD-10-CM | POA: Diagnosis not present

## 2019-01-22 DIAGNOSIS — G4733 Obstructive sleep apnea (adult) (pediatric): Secondary | ICD-10-CM | POA: Diagnosis not present

## 2019-01-22 NOTE — Patient Instructions (Signed)
Continue using CPAP on a regular basis  We will see in about 6 weeks  We will obtain another download at that time  Call us, contact medical supply company if the current mask still does not fit well  Increased activity as tolerated

## 2019-01-22 NOTE — Progress Notes (Signed)
Virtual Visit via Telephone Note  I connected with Caitlin [name] on 01/22/19 at  3:00 PM EST by telephone and verified that I am speaking with the correct person using two identifiers.  Location: Patient: Caitlin [name] Provider: Adrian Prince discussed the limitations, risks, security and privacy concerns of performing an evaluation and management service by telephone and the availability of in person appointments. I also discussed with the patient that there may be a patient responsible charge related to this service. The patient expressed understanding and agreed to proceed.   History of Present Illness: Connected with patient via video Has been having issues with her mask  Still sleepy during the day tired during the day  Just received a new mask but with mask issues   Observations/Objective: Looks well Compliance data reveals 100% compliance Average use of 8 hours 42 minutes AHI of 4.5 95 percentile leak of 31.8  Assessment and Plan: Obstructive sleep apnea -He does appear well treated with CPAP -Patient having issues with mask -She stated that the medical supply company does not think they can help her with a mask at present They have tried different masks  Deconditioning -I did encourage her to get more active  I offered to contact the medical supply company regarding masks/interfaces-she stated that they said they have tried everything they have to offer -I encouraged her to continue using CPAP on a regular basis as the download does reveal that it is adequately treating obstructive sleep apnea  Follow Up Instructions:    I discussed the assessment and treatment plan with the patient. The patient was provided an opportunity to ask questions and all were answered. The patient agreed with the plan and demonstrated an understanding of the instructions.   The patient was advised to call back or seek an in-person evaluation if the symptoms worsen or if the condition fails to  improve as anticipated.  I provided 21 minutes of non-face-to-face time during this encounter.   Laurin Coder, MD

## 2019-01-27 ENCOUNTER — Other Ambulatory Visit: Payer: Self-pay | Admitting: Internal Medicine

## 2019-02-02 ENCOUNTER — Other Ambulatory Visit: Payer: Self-pay | Admitting: Gastroenterology

## 2019-02-03 ENCOUNTER — Other Ambulatory Visit: Payer: Self-pay | Admitting: Gastroenterology

## 2019-02-03 ENCOUNTER — Other Ambulatory Visit: Payer: Self-pay | Admitting: Internal Medicine

## 2019-02-06 ENCOUNTER — Other Ambulatory Visit: Payer: Self-pay | Admitting: Gastroenterology

## 2019-02-06 ENCOUNTER — Telehealth: Payer: Self-pay

## 2019-02-06 NOTE — Telephone Encounter (Signed)
Pt needs a refill for her dicyclomine Appt 03/17/2019

## 2019-02-06 NOTE — Telephone Encounter (Signed)
Refill was sent to [facility] .

## 2019-02-24 ENCOUNTER — Other Ambulatory Visit: Payer: Self-pay | Admitting: Internal Medicine

## 2019-03-01 ENCOUNTER — Ambulatory Visit: Payer: Medicare Other | Attending: Internal Medicine

## 2019-03-01 DIAGNOSIS — Z23 Encounter for immunization: Secondary | ICD-10-CM | POA: Insufficient documentation

## 2019-03-01 NOTE — Progress Notes (Signed)
   Covid-19 Vaccination Clinic  Name:  Caitlin [name]    MRN: [id number] DOB: 10-19-1945  03/01/2019  [name] was observed post Covid-19 immunization for 15 minutes without incidence. She was provided with Vaccine Information Sheet and instruction to access the V-Safe system.   [name] was instructed to call 911 with any severe reactions post vaccine: [name] Difficulty breathing  . Swelling of your face and throat  . A fast heartbeat  . A bad rash all over your body  . Dizziness and weakness    Immunizations Administered    Name Date Dose VIS Date Route   Pfizer COVID-19 Vaccine 03/01/2019  3:19 PM 0.3 mL 12/27/2018 Intramuscular   Manufacturer: [place]   Lot: [id number]   [place]: [id number]

## 2019-03-10 ENCOUNTER — Encounter: Payer: Self-pay | Admitting: Neurology

## 2019-03-10 ENCOUNTER — Other Ambulatory Visit: Payer: Self-pay

## 2019-03-10 ENCOUNTER — Telehealth (INDEPENDENT_AMBULATORY_CARE_PROVIDER_SITE_OTHER): Payer: Medicare Other | Admitting: Neurology

## 2019-03-10 VITALS — Ht 59.0 in | Wt 154.0 lb

## 2019-03-10 DIAGNOSIS — Z9989 Dependence on other enabling machines and devices: Secondary | ICD-10-CM

## 2019-03-10 DIAGNOSIS — F0670 Mild neurocognitive disorder due to known physiological condition without behavioral disturbance: Secondary | ICD-10-CM

## 2019-03-10 DIAGNOSIS — G3184 Mild cognitive impairment, so stated: Secondary | ICD-10-CM | POA: Diagnosis not present

## 2019-03-10 NOTE — Progress Notes (Signed)
Virtual Visit via Video Note The purpose of this virtual visit is to provide medical care while limiting exposure to the novel coronavirus.    Consent was obtained for video visit:  Yes.   Answered questions that patient had about telehealth interaction:  Yes.   I discussed the limitations, risks, security and privacy concerns of performing an evaluation and management service by telemedicine. I also discussed with the patient that there may be a patient responsible charge related to this service. The patient expressed understanding and agreed to proceed.  Pt location: Home Physician Location: office Name of referring provider:  Burnis Medin, MD I connected with Caitlin [name] at patients initiation/request on 03/10/2019 at  3:00 PM EST by video enabled telemedicine application and verified that I am speaking with the correct person using two identifiers. Pt MRN:  [id number] Pt DOB:  May 04, 1945 Video Participants:  Caitlin [name]   History of Present Illness:  The patient was seen as a virtual video visit on 03/10/2019. She was last seen in the neurology clinic a year ago for worsening memory. Since her last visit, she underwent Neuropsychological testing in October 2020 which indicated Mild Neurocognitive Disorder (ie Mild Cognitive Impairment) with "consistent weakness across verbal fluency tasks, as well as notable performance variability across several cognitive domains, including processing speed, executive functioning, and encoding (i.e., learning) aspects of memory. Performance across visuospatial tasks declined with increased complexity, likely due to the presence of bilateral cataracts and ongoing visual acuity difficulties."  It was noted that there was no evidence of a neurodegenerative condition seen. Etiology of cognitive difficulties multifactorial in nature, including significant psychiatric distress, chronic pain due to fibromyalgia, and variable sleep disturbances. Improved  management of these symptoms could yield improvements in her cognitive functioning over time.   Findings discussed with the patient. She reports that she is still having trouble with her BiPAP machine. She has a card for a therapist but has not contacted them yet. Her big thing currently are intestinal issues, it is impossible to plan or do anything, she does not know when she will have the sudden urge to use the bathroom. She has been unable to exercise.   History on Initial Assessment 09/01/2014: This is a 74 yo RH woman with a history of hypertension, diet-controlled hyperlipidemia, IBS, GERD, chronic neck and back pain, depression and anxiety, who presented for worsening memory. She reports that memory changes started several years ago, she would be unable to think of a name or word she wanted to say. She has had to write everything down otherwise she would forget appointments. She recalls the first time this happened was 12 years ago, she got a reminder call about a dentist appointment 2 days prior but still missed it. She is has been stressed and worried because 2 of her husband's family members have recently passed away with dementia. She continues to drive without getting lost. She denies missing her regular medications but occasionally forgets her vitamins. She has to stay by the stove when cooking, one time she walked away from soup she was making and forgot about it. Her husband is in charge of bill payments. She has always had problems multi-tasking, but has noticed this has been worse the past couple of years, she easily loses her train of thought when distracted. Her maternal grandmother and 2 brothers were diagnosed with dementia. Her mother and sister had memory problems before they passed away from other medical conditions. She denies any significant  head injuries, no alcohol use.   Diagnostic Data: MRI brain in 2012 was personally reviewed today, unremarkable with mild diffuse atrophy. Similar  to MRI brain done 08/2014 as above.   Current Outpatient Medications on File Prior to Visit  Medication Sig Dispense Refill  . calcium carbonate (TUMS EX) 750 MG chewable tablet Chew 1 tablet by mouth as needed for heartburn.    . cholecalciferol (VITAMIN D) 1000 UNITS tablet Take 2,000 Units by mouth daily.     [name] dicyclomine (BENTYL) 10 MG capsule TAKE 1 CAPSULE BY MOUTH EVERY 8 HOURS AS NEEDED FOR  SPASMS 90 capsule 0  . escitalopram (LEXAPRO) 10 MG tablet Take 1 tablet by mouth once daily 90 tablet 0  . glucose blood (FREESTYLE LITE) test strip USE TO CHECK BLOOD SUGAR DAILY AND AS NEEDED 100 each 4  . hydrochlorothiazide (HYDRODIURIL) 12.5 MG tablet Take 1 tablet by mouth once daily 30 tablet 0  . Lancets (FREESTYLE) lancets USE TO CHECK BLOOD SUGAR DAILY AND AS NEEDED 100 each 4  . potassium chloride (KLOR-CON) 10 MEQ tablet TAKE 1 TABLET  BY MOUTH EVERY OTHER DAY WITH FLUID TABLET 45 tablet 0   No current facility-administered medications on file prior to visit.     Observations/Objective:   Vitals:   03/10/19 1018  Weight: 154 lb (69.9 kg)  Height: 4\' 11"  (1.499 m)   GEN:  The patient appears stated age and is in NAD.  Neurological examination: Patient is awake, alert, oriented x 3. No aphasia or dysarthria. Intact fluency and comprehension. Remote and recent memory intact.  Cranial nerves: Extraocular movements intact with no nystagmus. No facial asymmetry. Motor: moves all extremities symmetrically, at least anti-gravity x 4.   Assessment and Plan:   This is a 74 yo RH woman with a history of hypertension, diet-controlled hyperlipidemia, depression, anxiety, GERD,who presented for worsening memory loss. MRI brain unremarkable, TSH and B12 normal. 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  Improved management of these symptoms could yield improvements in her cognitive functioning over time. I discussed that there was no evidence of a neurodegenerative process at this time. We discussed recommendations, including repeat testing in 12-18 months, treatment of anxiety/depression, CBT, control of vascular risk factors, and sleep hygiene. She continues to work on these. She will follow-up after repeat Neuropsych testing and knows to call for any changes.    Follow Up Instructions:   -I discussed the assessment and treatment plan with the patient. The patient was provided an opportunity to ask questions and all were answered. The patient agreed with the plan and demonstrated an understanding of the instructions.   The patient was advised to call back or seek an in-person evaluation if the symptoms worsen or if the condition fails to improve as anticipated.     Cameron Sprang, MD

## 2019-03-17 ENCOUNTER — Ambulatory Visit (INDEPENDENT_AMBULATORY_CARE_PROVIDER_SITE_OTHER): Payer: Medicare Other | Admitting: Gastroenterology

## 2019-03-17 ENCOUNTER — Encounter: Payer: Self-pay | Admitting: Gastroenterology

## 2019-03-17 VITALS — BP 112/70 | HR 68 | Temp 97.4°F | Ht 59.0 in | Wt 144.8 lb

## 2019-03-17 DIAGNOSIS — K602 Anal fissure, unspecified: Secondary | ICD-10-CM | POA: Diagnosis not present

## 2019-03-17 DIAGNOSIS — K449 Diaphragmatic hernia without obstruction or gangrene: Secondary | ICD-10-CM | POA: Diagnosis not present

## 2019-03-17 DIAGNOSIS — K582 Mixed irritable bowel syndrome: Secondary | ICD-10-CM | POA: Diagnosis not present

## 2019-03-17 DIAGNOSIS — K219 Gastro-esophageal reflux disease without esophagitis: Secondary | ICD-10-CM

## 2019-03-17 MED ORDER — DEXILANT 30 MG PO CPDR: 30.0000 mg | DELAYED_RELEASE_CAPSULE | Freq: Every day | ORAL | 4 refills | Status: DC

## 2019-03-17 MED ORDER — AMBULATORY NON FORMULARY MEDICATION: 1 refills | Status: DC

## 2019-03-17 NOTE — Progress Notes (Signed)
Caitlin [name]    [id number]    09/07/1945  Primary Care Physician:Panosh, Standley Brooking, MD  Referring Physician: Burnis Medin, MD [place],  [place] [address]   Chief complaint:  GERD, Abdominal pain  HPI:  74 yr F here for follow up visit for abdominal bloating, pain and GERD  She has lower abdominal pain , pelvic and rectal pain radiating to the back.She has excessive gas and flatulence. Also c/o tenesmus and fecal urgency.  Despite taking Nexium daily, she is having breakthrough heartburn, regurgitation and sour taste.   Colonoscopy 10/04/2016: Diverticulosis. Internal hemorrhoids. recall colonoscopy 10 years  EGD 10/04/2016: 6cm Hiatal hernia, Slipped Nissen     Outpatient Encounter Medications as of 03/17/2019  Medication Sig  . calcium carbonate (TUMS EX) 750 MG chewable tablet Chew 1 tablet by mouth as needed for heartburn.  . cholecalciferol (VITAMIN D) 1000 UNITS tablet Take 2,000 Units by mouth daily.   [name] dicyclomine (BENTYL) 10 MG capsule TAKE 1 CAPSULE BY MOUTH EVERY 8 HOURS AS NEEDED FOR  SPASMS  . escitalopram (LEXAPRO) 10 MG tablet Take 1 tablet by mouth once daily  . esomeprazole (NEXIUM) 20 MG capsule Take 20 mg by mouth daily at 12 noon. Reported on 03/08/2015  . glucose blood (FREESTYLE LITE) test strip USE TO CHECK BLOOD SUGAR DAILY AND AS NEEDED  . hydrochlorothiazide (HYDRODIURIL) 12.5 MG tablet Take 1 tablet by mouth once daily  . Lancets (FREESTYLE) lancets USE TO CHECK BLOOD SUGAR DAILY AND AS NEEDED  . potassium chloride (KLOR-CON) 10 MEQ tablet TAKE 1 TABLET  BY MOUTH EVERY OTHER DAY WITH FLUID TABLET   No facility-administered encounter medications on file as of 03/17/2019.    Allergies as of 03/17/2019 - Review Complete 03/17/2019  Allergen Reaction Noted  . Mobic [meloxicam] Diarrhea 11/28/2011  . Aleve [naproxen]  05/15/2016  . Codeine phosphate Other (See Comments) 03/15/2006  . Colestipol hcl  05/24/2017  .  Omeprazole-sodium bicarbonate  12/23/2008  . Other  10/23/2017  . Protonix [pantoprazole sodium] Diarrhea 06/05/2012  . Sodium  11/16/2017  . Sulfamethoxazole Diarrhea and Nausea And Vomiting 03/15/2006    Past Medical History:  Diagnosis Date  . Allergy   . Anxiety   . Arthritis   . Blood transfusion without reported diagnosis   . Cataract    Bilateral  . Diabetes mellitus without complication (HCC)    Pre-diabetic, no meds, diet controlled  . Diverticulosis of colon   . Enteritis   . Erythema nodosum    Tends to be seasonal has had a negative chest x-ray in the past negative PPD  . Fibromyalgia   . Generalized anxiety disorder 09/01/2014  . GERD (gastroesophageal reflux disease)    History of stricture and dilatation and Nissen surgery  . Hypertension   . IBS (irritable bowel syndrome)   . 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   Qualifier: Diagnosis of  By: Regis Bill MD, Standley Brooking   . Obstructive sleep apnea   . Osteopenia     Past Surgical History:  Procedure Laterality Date  . ABDOMINAL HYSTERECTOMY    . bone spur    . CATARACT EXTRACTION W/ INTRAOCULAR LENS  IMPLANT, BILATERAL    . CESAREAN SECTION     x 1  . CHOLECYSTECTOMY    . COLONOSCOPY    . ESOPHAGOGASTRODUODENOSCOPY     stricture and dilatation  . left shoulder surgery  06/08, 07/12/17   x  2  . NISSEN FUNDOPLICATION    . ROTATOR CUFF REPAIR Left   . rt knee surgery     scope  . UPPER GASTROINTESTINAL ENDOSCOPY      Family History  Problem Relation Age of Onset  . Depression Mother   . Kidney cancer Mother   . Dementia Mother        Unspecified, likely secondary to other medical condition (cancer)  . Depression Father   . Hiatal hernia Father   . COPD Sister   . Dementia Sister        Unspecified, likely secondary to other medical condition  . Nephrolithiasis Other   . Colon cancer Neg Hx   . Esophageal cancer Neg Hx   . Pancreatic cancer Neg Hx   . Rectal cancer Neg Hx   . Stomach cancer Neg Hx      Social History   Socioeconomic History  . Marital status: Married    Spouse name: Not on file  . Number of children: 1  . Years of education: Not on file  . Highest education level: Not on file  Occupational History  . Occupation: Retired  Tobacco Use  . Smoking status: Never Smoker  . Smokeless tobacco: Never Used  Substance and Sexual Activity  . Alcohol use: No    Alcohol/week: 0.0 standard drinks  . Drug use: No  . Sexual activity: Not on file  Other Topics Concern  . Not on file  Social History Narrative   Unemployed   Married   [place] of 2    Has grandchildren out of state   No pets   Sis. died 28-Dec-2009 COPD   Social Determinants of Health   Financial Resource Strain:   . Difficulty of Paying Living Expenses: Not on file  Food Insecurity:   . Worried About Charity fundraiser in the Last Year: Not on file  . Ran Out of Food in the Last Year: Not on file  Transportation Needs:   . Lack of Transportation (Medical): Not on file  . Lack of Transportation (Non-Medical): Not on file  Physical Activity:   . Days of Exercise per Week: Not on file  . Minutes of Exercise per Session: Not on file  Stress:   . Feeling of Stress : Not on file  Social Connections:   . Frequency of Communication with Friends and Family: Not on file  . Frequency of Social Gatherings with Friends and Family: Not on file  . Attends Religious Services: Not on file  . Active Member of Clubs or Organizations: Not on file  . Attends Archivist Meetings: Not on file  . Marital Status: Not on file  Intimate Partner Violence:   . Fear of Current or Ex-Partner: Not on file  . Emotionally Abused: Not on file  . Physically Abused: Not on file  . Sexually Abused: Not on file      Review of systems:  All other review of systems negative except as mentioned in the HPI.   Physical Exam: Vitals:   03/17/19 1402  BP: 112/70  Pulse: 68  Temp: (!) 97.4 F (36.3 C)   Body mass  index is 29.25 kg/m. Gen:      No acute distress Abd:      + bowel sounds; soft, non-tender; no palpable masses, no distension Neuro: alert and oriented x 3 Psych: normal mood and affect Rectal exam: Increased anal tenderness, anterior anal fissure, no external hemorrhoids.  Data Reviewed:  Reviewed labs, radiology imaging, old records and pertinent past GI work up   Assessment and Plan/Recommendations:  74 year old female with fibromyalgia, chronic GERD s/p Nissen fundoplication with slipped Nissen, recurrent hernia and GERD symptoms  GERD: Switch to Dexilant 30 mg daily.  Stop Nexium Discussed antireflux measures and lifestyle modifications  Anal fissure on rectal exam, start 0.125% nitroglycerin small pea-sized amount per rectum 3-4 times daily for 6 to 8 weeks Benefiber 1 tablespoon 3 times daily with meals to prevent constipation  Trial of lactose-free diet for excessive bloating and flatulence  Return in 3 months or sooner if needed   The patient was provided an opportunity to ask questions and all were answered. The patient agreed with the plan and demonstrated an understanding of the instructions.  Damaris Hippo , MD    CC: Panosh, Standley Brooking, MD

## 2019-03-17 NOTE — Patient Instructions (Addendum)
We have sent a prescription for nitroglycerin 0.125% gel to [facility]. You should apply a pea size amount to your rectum three times daily x 6-8 weeks.  [facility]'s information is below: Address: [address] [place], [place] [address]  [phone]  *Please DO NOT go directly from our office to pick up this medication! Give the pharmacy 1 day to process the prescription as this is compounded and takes time to make.  _________________________________________________ Please purchase the following medications over the counter and take as directed: Benefiber 1 tablespoon three times daily with meals ___________________________________________________ We have sent the following medications to your pharmacy for you to pick up at your convenience: Dexilant 30 mg daily ___________________________________________________ Please follow antireflux measures given to you today. See handout. ____________________________________________________  Follow up with Dr Silverio Decamp in 3 months _____________________________________________________  If you are age 26 or older, your body mass index should be between 23-30. Your Body mass index is 29.25 kg/m. If this is out of the aforementioned range listed, please consider follow up with your Primary Care Provider.  If you are age 73 or younger, your body mass index should be between 19-25. Your Body mass index is 29.25 kg/m. If this is out of the aformentioned range listed, please consider follow up with your Primary Care Provider.

## 2019-03-24 ENCOUNTER — Encounter: Payer: Self-pay | Admitting: Gastroenterology

## 2019-03-24 ENCOUNTER — Ambulatory Visit: Payer: Medicare Other | Attending: Internal Medicine

## 2019-03-24 DIAGNOSIS — Z23 Encounter for immunization: Secondary | ICD-10-CM | POA: Insufficient documentation

## 2019-03-24 NOTE — Progress Notes (Signed)
   Covid-19 Vaccination Clinic  Name:  Caitlin [name]    MRN: [id number] DOB: Nov 14, 1945  03/24/2019  [name] was observed post Covid-19 immunization for 15 minutes without incident. She was provided with Vaccine Information Sheet and instruction to access the V-Safe system.   [name] was instructed to call 911 with any severe reactions post vaccine: [name] Difficulty breathing  . Swelling of face and throat  . A fast heartbeat  . A bad rash all over body  . Dizziness and weakness   Immunizations Administered    Name Date Dose VIS Date Route   Pfizer COVID-19 Vaccine 03/24/2019  1:24 PM 0.3 mL 12/27/2018 Intramuscular   Manufacturer: [place]   Lot: [id number]   [place]: [id number]

## 2019-04-01 ENCOUNTER — Other Ambulatory Visit: Payer: Self-pay | Admitting: Internal Medicine

## 2019-04-07 ENCOUNTER — Encounter: Payer: Medicare Other | Admitting: Gastroenterology

## 2019-04-22 ENCOUNTER — Other Ambulatory Visit: Payer: Self-pay | Admitting: Gastroenterology

## 2019-04-25 ENCOUNTER — Other Ambulatory Visit: Payer: Self-pay | Admitting: Internal Medicine

## 2019-05-06 ENCOUNTER — Other Ambulatory Visit: Payer: Self-pay

## 2019-05-07 ENCOUNTER — Ambulatory Visit (INDEPENDENT_AMBULATORY_CARE_PROVIDER_SITE_OTHER): Payer: Medicare Other | Admitting: Internal Medicine

## 2019-05-07 ENCOUNTER — Encounter: Payer: Self-pay | Admitting: Internal Medicine

## 2019-05-07 ENCOUNTER — Other Ambulatory Visit: Payer: Self-pay

## 2019-05-07 ENCOUNTER — Ambulatory Visit (INDEPENDENT_AMBULATORY_CARE_PROVIDER_SITE_OTHER): Payer: Medicare Other

## 2019-05-07 VITALS — BP 120/76 | HR 60 | Temp 97.8°F | Ht 59.0 in | Wt 145.6 lb

## 2019-05-07 DIAGNOSIS — G4733 Obstructive sleep apnea (adult) (pediatric): Secondary | ICD-10-CM | POA: Diagnosis not present

## 2019-05-07 DIAGNOSIS — M79652 Pain in left thigh: Secondary | ICD-10-CM | POA: Diagnosis not present

## 2019-05-07 DIAGNOSIS — Z9989 Dependence on other enabling machines and devices: Secondary | ICD-10-CM

## 2019-05-07 DIAGNOSIS — R7301 Impaired fasting glucose: Secondary | ICD-10-CM

## 2019-05-07 DIAGNOSIS — Z79899 Other long term (current) drug therapy: Secondary | ICD-10-CM

## 2019-05-07 DIAGNOSIS — R200 Anesthesia of skin: Secondary | ICD-10-CM

## 2019-05-07 DIAGNOSIS — K068 Other specified disorders of gingiva and edentulous alveolar ridge: Secondary | ICD-10-CM | POA: Diagnosis not present

## 2019-05-07 NOTE — Patient Instructions (Signed)
Lab and x ray today  This could be from hip  . Will decide on further evaluation if pain  From either ortho  Sports medicine or  Other specialities

## 2019-05-07 NOTE — Progress Notes (Signed)
This visit occurred during the SARS-CoV-2 public health emergency.  Safety protocols were in place, including screening questions prior to the visit, additional usage of staff PPE, and extensive cleaning of exam room while observing appropriate contact time as indicated for disinfecting solutions.     Chief Complaint  Patient presents with  . Leg Pain    Left thigh pain, top and outer side, getting worse    HPI: Caitlin [name] 74 y.o. come in for  Visit last seen 4 20  Has new problem Has had numbness feeling wax and wane over years and wonder sif from pervious fall  And in past  Months to year  Has pain in the area and sometimes stabbing pain  Lateral thigh ? To hip  No injury now .  No falling but sometimes feels gait is off . At dental check has area referred to [facility] to check bx concern about cancer.  OSA   mask doesn't fit  Well and opens at times .  Last visit  4 2020 Seen dr Delice Lesch for  [facility]  multifactorial ROS: See pertinent positives and negatives per HPI.  Past Medical History:  Diagnosis Date  . Allergy   . Anxiety   . Arthritis   . Blood transfusion without reported diagnosis   . Cataract    Bilateral  . Diabetes mellitus without complication (HCC)    Pre-diabetic, no meds, diet controlled  . Diverticulosis of colon   . Enteritis   . Erythema nodosum    Tends to be seasonal has had a negative chest x-ray in the past negative PPD  . Fibromyalgia   . Generalized anxiety disorder 09/01/2014  . GERD (gastroesophageal reflux disease)    History of stricture and dilatation and Nissen surgery  . Hypertension   . IBS (irritable bowel syndrome)   . 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   Qualifier: Diagnosis of  By: Regis Bill MD, Standley Brooking   . Obstructive sleep apnea   . Osteopenia     Family History  Problem Relation Age of Onset  . Depression Mother   . Kidney cancer Mother   . Dementia Mother        Unspecified, likely secondary to other medical condition (cancer)  .  Depression Father   . Hiatal hernia Father   . COPD Sister   . Dementia Sister        Unspecified, likely secondary to other medical condition  . Nephrolithiasis Other   . Colon cancer Neg Hx   . Esophageal cancer Neg Hx   . Pancreatic cancer Neg Hx   . Rectal cancer Neg Hx   . Stomach cancer Neg Hx     Social History   Socioeconomic History  . Marital status: Married    Spouse name: Not on file  . Number of children: 1  . Years of education: Not on file  . Highest education level: Not on file  Occupational History  . Occupation: Retired  Tobacco Use  . Smoking status: Never Smoker  . Smokeless tobacco: Never Used  Substance and Sexual Activity  . Alcohol use: No    Alcohol/week: 0.0 standard drinks  . Drug use: No  . Sexual activity: Not on file  Other Topics Concern  . Not on file  Social History Narrative   Unemployed   Married   [place] of 2    Has grandchildren out of state   No pets   Sis. died 01-05-2010 COPD   Social Determinants  of Health   Financial Resource Strain:   . Difficulty of Paying Living Expenses:   Food Insecurity:   . Worried About Charity fundraiser in the Last Year:   . Arboriculturist in the Last Year:   Transportation Needs:   . Film/video editor (Medical):   [name] Lack of Transportation (Non-Medical):   Physical Activity:   . Days of Exercise per Week:   . Minutes of Exercise per Session:   Stress:   . Feeling of Stress :   Social Connections:   . Frequency of Communication with Friends and Family:   . Frequency of Social Gatherings with Friends and Family:   . Attends Religious Services:   . Active Member of Clubs or Organizations:   . Attends Archivist Meetings:   [name] Marital Status:     Outpatient Medications Prior to Visit  Medication Sig Dispense Refill  . AMBULATORY NON FORMULARY MEDICATION Medication Name: nitroglycerin 0.125% gel . Apply a pea size amount to your rectum three times daily x 6-8 weeks. 30 g 1    . calcium carbonate (TUMS EX) 750 MG chewable tablet Chew 1 tablet by mouth as needed for heartburn.    . cholecalciferol (VITAMIN D) 1000 UNITS tablet Take 2,000 Units by mouth daily.     [name] Dexlansoprazole (DEXILANT) 30 MG capsule Take 1 capsule (30 mg total) by mouth daily. 30 capsule 4  . dicyclomine (BENTYL) 10 MG capsule TAKE 1 CAPSULE BY MOUTH EVERY 8 HOURS AS NEEDED FOR SPASMS 90 capsule 0  . escitalopram (LEXAPRO) 10 MG tablet Take 1 tablet by mouth once daily 90 tablet 0  . Fiber-Vitamins-Minerals (FIBERALL PO) Take 1 Units by mouth daily. Tablespoon    . hydrochlorothiazide (HYDRODIURIL) 12.5 MG tablet Take 1 tablet (12.5 mg total) by mouth daily. Needs appointment for further refills. 30 tablet 0  . potassium chloride (KLOR-CON) 10 MEQ tablet TAKE 1 TABLET  BY MOUTH EVERY OTHER DAY WITH FLUID TABLET 45 tablet 0  . glucose blood (FREESTYLE LITE) test strip USE TO CHECK BLOOD SUGAR DAILY AND AS NEEDED (Patient not taking: Reported on 05/07/2019) 100 each 4  . Lancets (FREESTYLE) lancets USE TO CHECK BLOOD SUGAR DAILY AND AS NEEDED (Patient not taking: Reported on 05/07/2019) 100 each 4   No facility-administered medications prior to visit.     EXAM:  BP 120/76   Pulse 60   Temp 97.8 F (36.6 C) (Temporal)   Ht 4\' 11"  (1.499 m)   Wt 145 lb 9.6 oz (66 kg)   SpO2 97%   BMI 29.41 kg/m   Body mass index is 29.41 kg/m.  GENERAL: vitals reviewed and listed above, alert, oriented, appears well hydrated and in no acute distress HEENT: atraumatic, conjunctiva  clear, no obvious abnormalities on inspection of external nose and ears OP : r upper lateral bum line grey white  Area  Not a lot of swelling   NECK: no obvious masses on inspection palpation  LUNGS: clear to auscultation bilaterally, no wheezes, rales or rhonchi, good air movement CV: HRRR, no clubbing cyanosis or  peripheral edema nl cap refill  MS: moves all extremities without noticeable focal  Abnormality left hip good  rom  Some tender lateral hip  No point tenderness  On bone or back  Slight asymmetry of MMass PSYCH: pleasant and cooperative, no obvious depression or anxiety Lab Results  Component Value Date   WBC 6.9 05/29/2018   HGB 13.8 05/29/2018  HCT 40.7 05/29/2018   PLT 275.0 05/29/2018   GLUCOSE 82 05/29/2018   CHOL 156 05/29/2018   TRIG 68.0 05/29/2018   HDL 81.70 05/29/2018   LDLCALC 61 05/29/2018   ALT 14 05/29/2018   AST 14 05/29/2018   NA 139 05/29/2018   K 3.7 05/29/2018   CL 99 05/29/2018   CREATININE 0.88 05/29/2018   BUN 14 05/29/2018   CO2 30 05/29/2018   TSH 1.49 04/17/2017   HGBA1C 5.7 05/29/2018   MICROALBUR 1.6 04/26/2011   BP Readings from Last 3 Encounters:  05/07/19 120/76  03/17/19 112/70  03/15/18 112/62    ASSESSMENT AND PLAN:  Discussed the following assessment and plan:  Pain of left thigh - Plan: Basic metabolic panel, CBC with Differential/Platelet, Hemoglobin A1c, Lipid panel, Hepatic function panel, TSH, DG FEMUR 1V LEFT, DG Hip Unilat W OR W/O Pelvis 2-3 Views Left, DG Hip Unilat W OR W/O Pelvis 2-3 Views Left, DG FEMUR 1V LEFT  Medication management - Plan: Basic metabolic panel, CBC with Differential/Platelet, Hemoglobin A1c, Lipid panel, Hepatic function panel, TSH, DG FEMUR 1V LEFT, DG Hip Unilat W OR W/O Pelvis 2-3 Views Left, DG Hip Unilat W OR W/O Pelvis 2-3 Views Left, DG FEMUR 1V LEFT  Fasting hyperglycemia - Plan: Basic metabolic panel, CBC with Differential/Platelet, Hemoglobin A1c, Lipid panel, Hepatic function panel, TSH, DG FEMUR 1V LEFT, DG Hip Unilat W OR W/O Pelvis 2-3 Views Left, DG Hip Unilat W OR W/O Pelvis 2-3 Views Left, DG FEMUR 1V LEFT  OSA on CPAP - Plan: Basic metabolic panel, CBC with Differential/Platelet, Hemoglobin A1c, Lipid panel, Hepatic function panel, TSH, DG FEMUR 1V LEFT, DG Hip Unilat W OR W/O Pelvis 2-3 Views Left, DG Hip Unilat W OR W/O Pelvis 2-3 Views Left, DG FEMUR 1V LEFT  Thigh numbness - Plan: Basic  metabolic panel, CBC with Differential/Platelet, Hemoglobin A1c, Lipid panel, Hepatic function panel, TSH, DG FEMUR 1V LEFT, DG Hip Unilat W OR W/O Pelvis 2-3 Views Left, DG Hip Unilat W OR W/O Pelvis 2-3 Views Left, DG FEMUR 1V LEFT  Gum lesion Uncertain causes  Of sx  Lateral numbness poss lfcn but now stabbing pain check  Dg x ray hip leg  And we can consider seeing ortho/sm if appropriate  Disc " cog decline" multifactorial not severe   Problems with cpap mask but doing ok  Depression anx seems less of issue today . She will be going to OS for  eval of the lesion area right upper  Gum area  Plan follo wup after above studies results and reviewed  -Patient advised to return or notify health care team  if  new concerns arise. In interim  34 minutes review  Counseling   Teams and plan of care  Patient Instructions  Lab and x ray today  This could be from hip  . Will decide on further evaluation if pain  From either ortho  Sports medicine or  Other specialities        Standley Brooking. Sueellen Kayes M.D.

## 2019-05-08 ENCOUNTER — Other Ambulatory Visit: Payer: Self-pay

## 2019-05-08 ENCOUNTER — Telehealth: Payer: Self-pay | Admitting: Internal Medicine

## 2019-05-08 DIAGNOSIS — M79652 Pain in left thigh: Secondary | ICD-10-CM

## 2019-05-08 DIAGNOSIS — R200 Anesthesia of skin: Secondary | ICD-10-CM

## 2019-05-08 LAB — HEPATIC FUNCTION PANEL
ALT: 16 U/L (ref 0–35)
AST: 16 U/L (ref 0–37)
Albumin: 4.5 g/dL (ref 3.5–5.2)
Alkaline Phosphatase: 71 U/L (ref 39–117)
Bilirubin, Direct: 0.2 mg/dL (ref 0.0–0.3)
Total Bilirubin: 0.7 mg/dL (ref 0.2–1.2)
Total Protein: 6.6 g/dL (ref 6.0–8.3)

## 2019-05-08 LAB — CBC WITH DIFFERENTIAL/PLATELET
Basophils Absolute: 0 10*3/uL (ref 0.0–0.1)
Basophils Relative: 0.6 % (ref 0.0–3.0)
Eosinophils Absolute: 0.2 10*3/uL (ref 0.0–0.7)
Eosinophils Relative: 2 % (ref 0.0–5.0)
HCT: 40.8 % (ref 36.0–46.0)
Hemoglobin: 13.4 g/dL (ref 12.0–15.0)
Lymphocytes Relative: 25.5 % (ref 12.0–46.0)
Lymphs Abs: 2 10*3/uL (ref 0.7–4.0)
MCHC: 32.9 g/dL (ref 30.0–36.0)
MCV: 87.9 fl (ref 78.0–100.0)
Monocytes Absolute: 0.5 10*3/uL (ref 0.1–1.0)
Monocytes Relative: 6.8 % (ref 3.0–12.0)
Neutro Abs: 5.1 10*3/uL (ref 1.4–7.7)
Neutrophils Relative %: 65.1 % (ref 43.0–77.0)
Platelets: 276 10*3/uL (ref 150.0–400.0)
RBC: 4.64 Mil/uL (ref 3.87–5.11)
RDW: 14.8 % (ref 11.5–15.5)
WBC: 7.9 10*3/uL (ref 4.0–10.5)

## 2019-05-08 LAB — LIPID PANEL
Cholesterol: 164 mg/dL (ref 0–200)
HDL: 85.9 mg/dL (ref >39.00)
LDL Cholesterol: 63 mg/dL (ref 0–99)
NonHDL: 78.05
Total CHOL/HDL Ratio: 2
Triglycerides: 76 mg/dL (ref 0.0–149.0)
VLDL: 15.2 mg/dL (ref 0.0–40.0)

## 2019-05-08 LAB — BASIC METABOLIC PANEL
BUN: 13 mg/dL (ref 6–23)
CO2: 29 mEq/L (ref 19–32)
Calcium: 9.7 mg/dL (ref 8.4–10.5)
Chloride: 97 mEq/L (ref 96–112)
Creatinine, Ser: 0.84 mg/dL (ref 0.40–1.20)
GFR: 66.43 mL/min (ref >60.00)
Glucose, Bld: 86 mg/dL (ref 70–99)
Potassium: 3.9 mEq/L (ref 3.5–5.1)
Sodium: 137 mEq/L (ref 135–145)

## 2019-05-08 LAB — TSH: TSH: 2.01 u[IU]/mL (ref 0.35–4.50)

## 2019-05-08 LAB — HEMOGLOBIN A1C: Hgb A1c MFr Bld: 5.5 % (ref 4.6–6.5)

## 2019-05-08 NOTE — Telephone Encounter (Signed)
The sports medicine doctor called her today to schedule for physical therapy. She declined the appointment because she thought she was going to see a sports medicine doctor not physical therapy.  Please advise

## 2019-05-08 NOTE — Progress Notes (Signed)
So good news that  hip and leg x ray are normal  But no specific explanation for the pain  but no alarming findings  Next step may be referring to ortho or spports medicine  to  get an opinion   . May we refer?    If so Caitlin [name] please do referral

## 2019-05-09 NOTE — Telephone Encounter (Signed)
I remember advising patient that it could be sports medicine or Ortho and she is already established with [facility].   Please advise if okay to keep ortho referral or do you want me to send in sports medicine?

## 2019-05-09 NOTE — Telephone Encounter (Signed)
I want her to see the provider physician  ( Her preference of practice,  [facility] is ok if she preferes   SM or  Ortho I.  )

## 2019-05-09 NOTE — Telephone Encounter (Signed)
Called patient and gave her the message from Dr. Regis Bill. Patient stated that she has an appointment on 05/19/19 with an oral surgeon to take a biopsy to see if she has cancer and she feels like going to ortho is too much for her at this time and if her symptoms become worse then she will go to Ortho or contact us if she needs a new referral.

## 2019-05-09 NOTE — Progress Notes (Signed)
Results are normal including blood sugar and cholesterol and kidney function

## 2019-05-15 ENCOUNTER — Telehealth: Payer: Self-pay | Admitting: Internal Medicine

## 2019-05-15 NOTE — Telephone Encounter (Signed)
This medication has already been sent to the pharmacy through the electronic refill system. Electronic refill confirmed receipt at 10:59am this morning.

## 2019-05-15 NOTE — Telephone Encounter (Signed)
Pt would like this medication refilled for a year. She is out of this medication and needs it sent in ASAP.   Medication Refill: Escitalipram Pharmacy: [facility]: [phone]

## 2019-05-26 ENCOUNTER — Other Ambulatory Visit: Payer: Self-pay | Admitting: Internal Medicine

## 2019-05-29 DIAGNOSIS — K136 Irritative hyperplasia of oral mucosa: Secondary | ICD-10-CM | POA: Diagnosis not present

## 2019-06-03 DIAGNOSIS — D3709 Neoplasm of uncertain behavior of other specified sites of the oral cavity: Secondary | ICD-10-CM | POA: Diagnosis not present

## 2019-06-03 HISTORY — PX: MOUTH SURGERY: SHX715

## 2019-06-04 ENCOUNTER — Telehealth: Payer: Self-pay

## 2019-06-04 NOTE — Telephone Encounter (Signed)
Called patient and gave her lab results. Patient verbalized an understanding.

## 2019-06-09 ENCOUNTER — Ambulatory Visit (INDEPENDENT_AMBULATORY_CARE_PROVIDER_SITE_OTHER): Payer: Medicare Other | Admitting: Gastroenterology

## 2019-06-09 ENCOUNTER — Encounter: Payer: Self-pay | Admitting: Gastroenterology

## 2019-06-09 VITALS — BP 118/74 | HR 65 | Ht 59.0 in | Wt 143.4 lb

## 2019-06-09 DIAGNOSIS — K449 Diaphragmatic hernia without obstruction or gangrene: Secondary | ICD-10-CM

## 2019-06-09 DIAGNOSIS — R140 Abdominal distension (gaseous): Secondary | ICD-10-CM

## 2019-06-09 DIAGNOSIS — K219 Gastro-esophageal reflux disease without esophagitis: Secondary | ICD-10-CM

## 2019-06-09 DIAGNOSIS — K580 Irritable bowel syndrome with diarrhea: Secondary | ICD-10-CM

## 2019-06-09 DIAGNOSIS — K602 Anal fissure, unspecified: Secondary | ICD-10-CM | POA: Diagnosis not present

## 2019-06-09 MED ORDER — DEXILANT 60 MG PO CPDR: 60.0000 mg | DELAYED_RELEASE_CAPSULE | Freq: Every day | ORAL | 0 refills | Status: DC | PRN

## 2019-06-09 MED ORDER — DEXILANT 30 MG PO CPDR: 30.0000 mg | DELAYED_RELEASE_CAPSULE | Freq: Every day | ORAL | 11 refills | Status: DC

## 2019-06-09 NOTE — Progress Notes (Signed)
Caitlin [name]    MV:[id number]    03/09/1945  Primary Care Physician:Panosh, Standley Brooking, MD  Referring Physician: Burnis Medin, MD [place],  [place] [id number]   Chief complaint:  Epigastric abdominal pain, gas, IBS  HPI:  6 yr F here for follow up visit for chronic epigastric abdominal pain, bloating and IBS  Last office visit March 17, 2019 GERD: Switch to Dexilant 30 mg daily  Anal fissure: Started on nitroglycerin, reports improvement of rectal pain  Trial of lactose-free diet for abdominal bloating and excessive flatulence   Colonoscopy 10/04/2016: Diverticulosis. Internal hemorrhoids. recall colonoscopy 10 years  EGD 10/04/2016: 6cm Hiatal hernia, Slipped Nissen   Outpatient Encounter Medications as of 06/09/2019  Medication Sig  . AMBULATORY NON FORMULARY MEDICATION Medication Name: nitroglycerin 0.125% gel . Apply a pea size amount to your rectum three times daily x 6-8 weeks.  . calcium carbonate (TUMS EX) 750 MG chewable tablet Chew 1 tablet by mouth as needed for heartburn.  . cholecalciferol (VITAMIN D) 1000 UNITS tablet Take 2,000 Units by mouth daily.   [name] Dexlansoprazole (DEXILANT) 30 MG capsule Take 1 capsule (30 mg total) by mouth daily.  [name] dicyclomine (BENTYL) 10 MG capsule TAKE 1 CAPSULE BY MOUTH EVERY 8 HOURS AS NEEDED FOR SPASMS  . escitalopram (LEXAPRO) 10 MG tablet Take 1 tablet by mouth once daily  . Fiber-Vitamins-Minerals (FIBERALL PO) Take 1 Units by mouth daily. Tablespoon  . hydrochlorothiazide (HYDRODIURIL) 12.5 MG tablet Take 1 tablet (12.5 mg total) by mouth daily.  . potassium chloride (KLOR-CON) 10 MEQ tablet TAKE 1 TABLET BY MOUTH EVERY OTHER DAY WITH  FLUID  TABLET  . [DISCONTINUED] glucose blood (FREESTYLE LITE) test strip USE TO CHECK BLOOD SUGAR DAILY AND AS NEEDED (Patient not taking: Reported on 05/07/2019)  . [DISCONTINUED] Lancets (FREESTYLE) lancets USE TO CHECK BLOOD SUGAR DAILY AND AS NEEDED (Patient  not taking: Reported on 05/07/2019)   No facility-administered encounter medications on file as of 06/09/2019.    Allergies as of 06/09/2019 - Review Complete 06/09/2019  Allergen Reaction Noted  . Mobic [meloxicam] Diarrhea 11/28/2011  . Aleve [naproxen]  05/15/2016  . Codeine phosphate Other (See Comments) 03/15/2006  . Colestipol hcl  05/24/2017  . Omeprazole-sodium bicarbonate  12/23/2008  . Other  10/23/2017  . Protonix [pantoprazole sodium] Diarrhea 06/05/2012  . Sodium  11/16/2017  . Sulfamethoxazole Diarrhea and Nausea And Vomiting 03/15/2006    Past Medical History:  Diagnosis Date  . Allergy   . Anxiety   . Arthritis   . Blood transfusion without reported diagnosis   . Cataract    Bilateral  . Diabetes mellitus without complication (HCC)    Pre-diabetic, no meds, diet controlled  . Diverticulosis of colon   . Enteritis   . Erythema nodosum    Tends to be seasonal has had a negative chest x-ray in the past negative PPD  . Fibromyalgia   . Generalized anxiety disorder 09/01/2014  . GERD (gastroesophageal reflux disease)    History of stricture and dilatation and Nissen surgery  . Hypertension   . IBS (irritable bowel syndrome)   . 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   Qualifier: Diagnosis of  By: Regis Bill MD, Standley Brooking   . Obstructive sleep apnea   . Osteopenia     Past Surgical History:  Procedure Laterality Date  . ABDOMINAL HYSTERECTOMY    . bone spur    . CATARACT EXTRACTION  W/ INTRAOCULAR LENS  IMPLANT, BILATERAL    . CESAREAN SECTION     x 1  . CHOLECYSTECTOMY    . COLONOSCOPY    . ESOPHAGOGASTRODUODENOSCOPY     stricture and dilatation  . left shoulder surgery  06/08, 07/12/17   x 2  . NISSEN FUNDOPLICATION    . ROTATOR CUFF REPAIR Left   . rt knee surgery     scope  . UPPER GASTROINTESTINAL ENDOSCOPY      Family History  Problem Relation Age of Onset  . Depression Mother   . Kidney cancer Mother   . Dementia Mother        Unspecified,  likely secondary to other medical condition (cancer)  . Depression Father   . Hiatal hernia Father   . COPD Sister   . Dementia Sister        Unspecified, likely secondary to other medical condition  . Nephrolithiasis Other   . Colon cancer Neg Hx   . Esophageal cancer Neg Hx   . Pancreatic cancer Neg Hx   . Rectal cancer Neg Hx   . Stomach cancer Neg Hx     Social History   Socioeconomic History  . Marital status: Married    Spouse name: Not on file  . Number of children: 1  . Years of education: Not on file  . Highest education level: Not on file  Occupational History  . Occupation: Retired  Tobacco Use  . Smoking status: Never Smoker  . Smokeless tobacco: Never Used  Substance and Sexual Activity  . Alcohol use: No    Alcohol/week: 0.0 standard drinks  . Drug use: No  . Sexual activity: Not on file  Other Topics Concern  . Not on file  Social History Narrative   Unemployed   Married   [place] of 2    Has grandchildren out of state   No pets   Sis. died 2009/12/27 COPD   Social Determinants of Health   Financial Resource Strain:   . Difficulty of Paying Living Expenses:   Food Insecurity:   . Worried About Charity fundraiser in the Last Year:   . Arboriculturist in the Last Year:   Transportation Needs:   . Film/video editor (Medical):   [name] Lack of Transportation (Non-Medical):   Physical Activity:   . Days of Exercise per Week:   . Minutes of Exercise per Session:   Stress:   . Feeling of Stress :   Social Connections:   . Frequency of Communication with Friends and Family:   . Frequency of Social Gatherings with Friends and Family:   . Attends Religious Services:   . Active Member of Clubs or Organizations:   . Attends Archivist Meetings:   [name] Marital Status:   Intimate Partner Violence:   . Fear of Current or Ex-Partner:   . Emotionally Abused:   [name] Physically Abused:   . Sexually Abused:       Review of systems: All other  review of systems negative except as mentioned in the HPI.   Physical Exam: Vitals:   06/09/19 1304  BP: 118/74  Pulse: 65   Body mass index is 28.96 kg/m. Gen:      No acute distress Abd:       soft, non-tender; no palpable masses, no distension Ext:    No edema Neuro: alert and oriented x 3 Psych: normal mood and affect  Data Reviewed:  Reviewed  labs, radiology imaging, old records and pertinent past GI work up   Assessment and Plan/Recommendations:  74 year old very pleasant female with history of fibromyalgia, chronic GERD s/p Nissen fundoplication complicated by a slipped Nissen and recurrent hernia and GERD symptoms  GERD: Continue Dexilant, patient was provided samples for higher dose Dexilant 60 mg daily to see if she has significant improvement with it Continue antireflux measures and lifestyle modifications If continues to have persistent symptoms, will refer to [facility] for revision of slipped Nissen and recurrent hiatal hernia  Anal fissure: Improving but has still persistent mild symptoms  Continue 0.125% nitroglycerin, small pea-sized amount per rectum 3 times daily for additional 4 to 6 weeks Continue Benefiber 1 tablespoon 3 times daily with meals  IBS, abdominal discomfort: Use dicyclomine up to 3 times daily as needed  Return in 3 months or sooner if needed   The patient was provided an opportunity to ask questions and all were answered. The patient agreed with the plan and demonstrated an understanding of the instructions.  Damaris Hippo , MD    CC: Panosh, Standley Brooking, MD

## 2019-06-09 NOTE — Patient Instructions (Addendum)
We have sent the following medications to your pharmacy for you to pick up at your convenience: Dexilant  We are giving you samples of the Dexilant 60mg  to take one capsule daily if having a severe GERD day.   Please take Benefiber 1 teaspoon 2-3 times daily with meals. Handout provided.    Continue your dicyclomine as needed and also your Nitroglycerin 0.125 % 2-3 times daily.   Please make an August appointment with Dr Silverio Decamp.   I appreciate the opportunity to care for you. Damaris Hippo, MD

## 2019-06-17 ENCOUNTER — Encounter: Payer: Self-pay | Admitting: Gastroenterology

## 2019-07-15 ENCOUNTER — Other Ambulatory Visit: Payer: Self-pay | Admitting: Gastroenterology

## 2019-08-05 ENCOUNTER — Telehealth: Payer: Self-pay | Admitting: Gastroenterology

## 2019-08-05 NOTE — Telephone Encounter (Signed)
Samples left for patient and patient is aware

## 2019-08-11 ENCOUNTER — Other Ambulatory Visit: Payer: Self-pay | Admitting: Internal Medicine

## 2019-08-25 ENCOUNTER — Other Ambulatory Visit: Payer: Self-pay | Admitting: Internal Medicine

## 2019-08-28 ENCOUNTER — Ambulatory Visit (INDEPENDENT_AMBULATORY_CARE_PROVIDER_SITE_OTHER): Payer: Medicare Other

## 2019-08-28 ENCOUNTER — Other Ambulatory Visit: Payer: Self-pay

## 2019-08-28 DIAGNOSIS — Z0000 Encounter for general adult medical examination without abnormal findings: Secondary | ICD-10-CM | POA: Diagnosis not present

## 2019-08-28 NOTE — Progress Notes (Addendum)
Subjective:   Caitlin [name] is a 74 y.o. female who presents for Medicare Annual (Subsequent) preventive examination.  I connected with Felipa Evener today by telephone and verified that I am speaking with the correct person using two identifiers. Location patient: home Location provider: work Persons participating in the virtual visit: patient, provider.   I discussed the limitations, risks, security and privacy concerns of performing an evaluation and management service by telephone and the availability of in person appointments. I also discussed with the patient that there may be a patient responsible charge related to this service. The patient expressed understanding and verbally consented to this telephonic visit.    Interactive audio and video telecommunications were attempted between this provider and patient, however failed, due to patient having technical difficulties OR patient did not have access to video capability.  We continued and completed visit with audio only.      Review of Systems    N/A Cardiac Risk Factors include: advanced age (>59men, >37 women);hypertension     Objective:    Today's Vitals   08/28/19 1533 08/28/19 1537  PainSc: 4  4    There is no height or weight on file to calculate BMI.  Advanced Directives 08/28/2019 03/10/2019 12/17/2017 10/23/2017 10/19/2016 04/28/2016  Does Patient Have a Medical Advance Directive? Yes Yes Yes Yes Yes Yes  Type of Paramedic of [place];Living will [place];Living will [place];Living will  Does patient want to make changes to medical advance directive? No - Patient declined - No - Patient declined - - -  Copy of [place] in Chart? Yes - validated most recent copy scanned in chart (See row information) - No - copy requested - - -    Current Medications (verified) Outpatient Encounter Medications as of  08/28/2019  Medication Sig  . AMBULATORY NON FORMULARY MEDICATION Medication Name: nitroglycerin 0.125% gel . Apply a pea size amount to your rectum three times daily x 6-8 weeks.  . calcium carbonate (TUMS EX) 750 MG chewable tablet Chew 1 tablet by mouth as needed for heartburn.  . cholecalciferol (VITAMIN D) 1000 UNITS tablet Take 2,000 Units by mouth daily.   [name] Dexlansoprazole (DEXILANT) 30 MG capsule Take 1 capsule (30 mg total) by mouth daily before breakfast.  . dexlansoprazole (DEXILANT) 60 MG capsule Take 1 capsule (60 mg total) by mouth daily as needed.  . dicyclomine (BENTYL) 10 MG capsule TAKE 1 CAPSULE BY MOUTH EVERY 8 HOURS AS NEEDED FOR  SPASMS  . escitalopram (LEXAPRO) 10 MG tablet Take 1 tablet by mouth once daily  . Fiber-Vitamins-Minerals (FIBERALL PO) Take 1 Units by mouth daily. Tablespoon  . hydrochlorothiazide (HYDRODIURIL) 12.5 MG tablet Take 1 tablet by mouth once daily  . potassium chloride (KLOR-CON) 10 MEQ tablet TAKE 1 TABLET BY MOUTH EVERY OTHER DAY WITH  FLUID TABLET   No facility-administered encounter medications on file as of 08/28/2019.    Allergies (verified) Mobic [meloxicam], Aleve [naproxen], Codeine phosphate, Colestipol hcl, Omeprazole-sodium bicarbonate, Other, Protonix [pantoprazole sodium], Sodium, and Sulfamethoxazole   History: Past Medical History:  Diagnosis Date  . Allergy   . Anxiety   . Arthritis   . Blood transfusion without reported diagnosis   . Cataract    Bilateral  . Diabetes mellitus without complication (HCC)    Pre-diabetic, no meds, diet controlled  . Diverticulosis of colon   . Enteritis   . Erythema nodosum  Tends to be seasonal has had a negative chest x-ray in the past negative PPD  . Fibromyalgia   . Generalized anxiety disorder 09/01/2014  . GERD (gastroesophageal reflux disease)    History of stricture and dilatation and Nissen surgery  . Hypertension   . IBS (irritable bowel syndrome)   . Major depressive  disorder 09/10/2006   Qualifier: Diagnosis of  By: Regis Bill MD, Standley Brooking   . Obstructive sleep apnea   . Osteopenia    Past Surgical History:  Procedure Laterality Date  . ABDOMINAL HYSTERECTOMY    . bone spur    . CATARACT EXTRACTION W/ INTRAOCULAR LENS  IMPLANT, BILATERAL    . CESAREAN SECTION     x 1  . CHOLECYSTECTOMY    . COLONOSCOPY    . ESOPHAGOGASTRODUODENOSCOPY     stricture and dilatation  . left shoulder surgery  06/08, 07/12/17   x 2  . MOUTH SURGERY Right 06/03/2019  . NISSEN FUNDOPLICATION    . ROTATOR CUFF REPAIR Left   . rt knee surgery     scope  . UPPER GASTROINTESTINAL ENDOSCOPY     Family History  Problem Relation Age of Onset  . Depression Mother   . Kidney cancer Mother   . Dementia Mother        Unspecified, likely secondary to other medical condition (cancer)  . Depression Father   . Hiatal hernia Father   . COPD Sister   . Dementia Sister        Unspecified, likely secondary to other medical condition  . Nephrolithiasis Other   . Colon cancer Neg Hx   . Esophageal cancer Neg Hx   . Pancreatic cancer Neg Hx   . Rectal cancer Neg Hx   . Stomach cancer Neg Hx    Social History   Socioeconomic History  . Marital status: Married    Spouse name: Not on file  . Number of children: 1  . Years of education: Not on file  . Highest education level: Not on file  Occupational History  . Occupation: Retired  Tobacco Use  . Smoking status: Never Smoker  . Smokeless tobacco: Never Used  Vaping Use  . Vaping Use: Never used  Substance and Sexual Activity  . Alcohol use: No    Alcohol/week: 0.0 standard drinks  . Drug use: No  . Sexual activity: Not on file  Other Topics Concern  . Not on file  Social History Narrative   Unemployed   Married   [place] of 2    Has grandchildren out of state   No pets   Sis. died 12-09-09 COPD   Social Determinants of Health   Financial Resource Strain: Low Risk   . Difficulty of Paying Living Expenses: Not  hard at all  Food Insecurity: No Food Insecurity  . Worried About Charity fundraiser in the Last Year: Never true  . Ran Out of Food in the Last Year: Never true  Transportation Needs: No Transportation Needs  . Lack of Transportation (Medical): No  . Lack of Transportation (Non-Medical): No  Physical Activity: Inactive  . Days of Exercise per Week: 0 days  . Minutes of Exercise per Session: 0 min  Stress: No Stress Concern Present  . Feeling of Stress : Not at all  Social Connections: Moderately Integrated  . Frequency of Communication with Friends and Family: Once a week  . Frequency of Social Gatherings with Friends and Family: Twice a week  .  Attends Religious Services: More than 4 times per year  . Active Member of Clubs or Organizations: No  . Attends Archivist Meetings: Never  . Marital Status: Married    Tobacco Counseling Counseling given: Not Answered   Clinical Intake:  Pre-visit preparation completed: Yes  Pain : 0-10 Pain Score: 4  Pain Type: Acute pain, Chronic pain Pain Location: Abdomen Pain Descriptors / Indicators: Aching Pain Onset: In the past 7 days Pain Frequency: Intermittent Pain Relieving Factors: Bentyl  Pain Relieving Factors: Bentyl  Nutritional Risks: Nausea/ vomitting/ diarrhea (diarrhea due to IBS) Diabetes: No  How often do you need to have someone help you when you read instructions, pamphlets, or other written materials from your doctor or pharmacy?: 1 - Never What is the last grade level you completed in school?: 2 1/2 years of college  Diabetic?No  Interpreter Needed?: No  Information entered by :: [place] of Daily Living In your present state of health, do you have any difficulty performing the following activities: 08/28/2019  Hearing? Y  Comment States that she has some issues hearing at times. Does not hear well in noisy environments  Vision? N  Difficulty concentrating or making decisions? Y    Comment Has issues with names  Walking or climbing stairs? N  Dressing or bathing? N  Doing errands, shopping? N  Preparing Food and eating ? N  Using the Toilet? N  Do you have problems with loss of bowel control? N  Managing your Medications? N  Managing your Finances? N  Housekeeping or managing your Housekeeping? N  Some recent data might be hidden    Patient Care Team: Panosh, Standley Brooking, MD as PCP - General Ernesto Rutherford Maudry Mayhew, MD Lafayette Dragon, MD (Inactive) (Gastroenterology) Jarome Matin, MD as Consulting Physician (Dermatology) Monna Fam, MD as Attending Physician (Ophthalmology) Elsie Saas, MD as Attending Physician (Orthopedic Surgery) Sydnee Levans, MD as Consulting Physician (Dermatology) Cameron Sprang, MD as Consulting Physician (Neurology)  Indicate any recent Medical Services you may have received from other than [facility] providers in the past year (date may be approximate).     Assessment:   This is a routine wellness examination for [place].  Hearing/Vision screen  Hearing Screening   125Hz  250Hz  500Hz  1000Hz  2000Hz  3000Hz  4000Hz  6000Hz  8000Hz   Right ear:           Left ear:           Vision Screening Comments: Patient states that she gets eye checked yearly   Dietary issues and exercise activities discussed: Current Exercise Habits: The patient does not participate in regular exercise at present  Goals    . Exercise 150 minutes per week (moderate activity)     Walking longer distances would assist you to go to [place] with your grandson IBS is limiting; has a fitness center in the neighborhood  Will try to go x2 days     . Patient Stated     To be stronger  Has a gym in the neighborhood and will try to start doing some after PT     . Patient Stated     Would like to be able to get outside more       Depression Screen PHQ 2/9 Scores 08/28/2019 10/23/2017 04/17/2017 10/19/2016 08/30/2015 05/05/2012 04/29/2012  PHQ - 2 Score 1 6 6  0 2 1 0  PHQ-  9 Score 7 21 - - - - -    Fall Risk Fall Risk  08/28/2019 03/10/2019 03/12/2018 10/23/2017 04/17/2017  Falls in the past year? 0 0 1 Yes No  Number falls in past yr: 0 0 1 1 -  Injury with Fall? 0 0 1 Yes -  Risk for fall due to : History of fall(s) - - - -  Follow up Falls evaluation completed;Falls prevention discussed - - - -    Any stairs in or around the home? No  If so, are there any without handrails? No  Home free of loose throw rugs in walkways, pet beds, electrical cords, etc? Yes  Adequate lighting in your home to reduce risk of falls? Yes   ASSISTIVE DEVICES UTILIZED TO PREVENT FALLS:  Life alert? No  Use of a cane, walker or w/c? No  Grab bars in the bathroom? No  Shower chair or bench in shower? Yes  Elevated toilet seat or a handicapped toilet? No    Cognitive Function: MMSE - Mini Mental State Exam 03/12/2018 10/23/2017 03/12/2017 10/19/2016 03/10/2016  Not completed: - (No Data) - (No Data) -  Orientation to time 5 - 5 - 5  Orientation to Place 5 - 5 - 5  Registration 3 - 3 - 3  Attention/ Calculation 3 - 3 - 4  Recall 3 - 3 - 3  Language- name 2 objects 2 - 2 - 2  Language- repeat 1 - 1 - 1  Language- follow 3 step command 3 - 3 - 3  Language- read & follow direction 1 - 1 - 1  Write a sentence 1 - 1 - 1  Copy design 1 - 1 - 1  Total score 28 - 28 - 29     6CIT Screen 08/28/2019  What Year? 0 points  What month? 0 points  What time? 0 points  Count back from 20 0 points  Months in reverse 0 points  Repeat phrase 0 points  Total Score 0    Immunizations Immunization History  Administered Date(s) Administered  . H1N1 03/09/2008  . Influenza Split 11/08/2010, 10/17/2011  . Influenza Whole 11/14/2006, 10/09/2007, 10/21/2008, 10/19/2009  . Influenza, High Dose Seasonal PF 10/06/2014, 10/01/2015, 10/05/2016, 10/12/2017, 10/03/2018  . Influenza,inj,Quad PF,6+ Mos 10/11/2012, 10/14/2013  . Influenza-Unspecified 10/01/2018  . PFIZER SARS-COV-2 Vaccination  03/01/2019, 03/24/2019  . PPD Test 06/05/2012  . Pneumococcal Conjugate-13 11/05/2012  . Pneumococcal Polysaccharide-23 03/09/2008, 10/06/2014  . Tdap 10/27/2011  . Zoster 08/08/2010  . Zoster Recombinat (Shingrix) 10/17/2017, 01/02/2018    TDAP status: Up to date Flu Vaccine status: Up to date Pneumococcal vaccine status: Up to date Covid-19 vaccine status: Completed vaccines  Qualifies for Shingles Vaccine? Yes   Zostavax completed Yes   Shingrix Completed?: Yes  Screening Tests Health Maintenance  Topic Date Due  . INFLUENZA VACCINE  08/17/2019  . URINE MICROALBUMIN  10/17/2019 (Originally 04/25/2012)  . MAMMOGRAM  10/21/2020  . TETANUS/TDAP  10/26/2021  . COLONOSCOPY  10/05/2026  . DEXA SCAN  Completed  . COVID-19 Vaccine  Completed  . Hepatitis C Screening  Completed  . PNA vac Low Risk Adult  Completed    Health Maintenance  Health Maintenance Due  Topic Date Due  . INFLUENZA VACCINE  08/17/2019    Colorectal cancer screening: Completed 10/04/2016. Repeat every 10 years Mammogram status: Completed 10/22/2018. Repeat every year Bone Density status: Completed 12/27/2017. Results reflect: Bone density results: OSTEOPOROSIS. Repeat every 2 years.  Lung Cancer Screening: (Low Dose CT Chest recommended if Age 84-80 years, 30 pack-year currently smoking OR have quit w/in  15years.) does not qualify.   Lung Cancer Screening Referral: N/A  Additional Screening:  Hepatitis C Screening: does qualify; Completed 11/05/2007  Vision Screening: Recommended annual ophthalmology exams for early detection of glaucoma and other disorders of the eye. Is the patient up to date with their annual eye exam?  Yes  Who is the provider or what is the name of the office in which the patient attends annual eye exams? Dr. Herbert Deaner If pt is not established with a provider, would they like to be referred to a provider to establish care? No .   Dental Screening: Recommended annual dental  exams for proper oral hygiene  Community Resource Referral / Chronic Care Management: CRR required this visit?  No   CCM required this visit?  No      Plan:     I have personally reviewed and noted the following in the patient's chart:   . Medical and social history . Use of alcohol, tobacco or illicit drugs  . Current medications and supplements . Functional ability and status . Nutritional status . Physical activity . Advanced directives . List of other physicians . Hospitalizations, surgeries, and ER visits in previous 12 months . Vitals . Screenings to include cognitive, depression, and falls . Referrals and appointments  In addition, I have reviewed and discussed with patient certain preventive protocols, quality metrics, and best practice recommendations. A written personalized care plan for preventive services as well as general preventive health recommendations were provided to patient.     Ofilia Neas, LPN   02/25/4707   Nurse Notes: None    Above noted reviewed and agree. Shanon Ace, MD

## 2019-08-28 NOTE — Patient Instructions (Signed)
Caitlin [name] , Thank you for taking time to come for your Medicare Wellness Visit. I appreciate your ongoing commitment to your health goals. Please review the following plan we discussed and let me know if I can assist you in the future.   Screening recommendations/referrals: Colonoscopy: Up to date, next due 10/03/2026 Mammogram: Up to date, next due 10/22/2019 Bone Density: Up to date next due 12/28/2019 Recommended yearly ophthalmology/optometry visit for glaucoma screening and checkup Recommended yearly dental visit for hygiene and checkup  Vaccinations: Influenza vaccine: Up to date, next due Fall 2021 Pneumococcal vaccine: completed series  Tdap vaccine: Up to date, next due 10/26/2021 Shingles vaccine: Completed series     Advanced directives: copies on file   Conditions/risks identified: none   Next appointment: None    Preventive Care 73 Years and Older, Female Preventive care refers to lifestyle choices and visits with your health care provider that can promote health and wellness. What does preventive care include?  A yearly physical exam. This is also called an annual well check.  Dental exams once or twice a year.  Routine eye exams. Ask your health care provider how often you should have your eyes checked.  Personal lifestyle choices, including:  Daily care of your teeth and gums.  Regular physical activity.  Eating a healthy diet.  Avoiding tobacco and drug use.  Limiting alcohol use.  Practicing safe sex.  Taking low-dose aspirin every day.  Taking vitamin and mineral supplements as recommended by your health care provider. What happens during an annual well check? The services and screenings done by your health care provider during your annual well check will depend on your age, overall health, lifestyle risk factors, and family history of disease. Counseling  Your health care provider may ask you questions about your:  Alcohol use.  Tobacco  use.  Drug use.  Emotional well-being.  Home and relationship well-being.  Sexual activity.  Eating habits.  History of falls.  Memory and ability to understand (cognition).  Work and work Statistician.  Reproductive health. Screening  You may have the following tests or measurements:  Height, weight, and BMI.  Blood pressure.  Lipid and cholesterol levels. These may be checked every 5 years, or more frequently if you are over 73 years old.  Skin check.  Lung cancer screening. You may have this screening every year starting at age 73 if you have a 30-pack-year history of smoking and currently smoke or have quit within the past 15 years.  Fecal occult blood test (FOBT) of the stool. You may have this test every year starting at age 73.  Flexible sigmoidoscopy or colonoscopy. You may have a sigmoidoscopy every 5 years or a colonoscopy every 10 years starting at age 73.  Hepatitis C blood test.  Hepatitis B blood test.  Sexually transmitted disease (STD) testing.  Diabetes screening. This is done by checking your blood sugar (glucose) after you have not eaten for a while (fasting). You may have this done every 1-3 years.  Bone density scan. This is done to screen for osteoporosis. You may have this done starting at age 73.  Mammogram. This may be done every 1-2 years. Talk to your health care provider about how often you should have regular mammograms. Talk with your health care provider about your test results, treatment options, and if necessary, the need for more tests. Vaccines  Your health care provider may recommend certain vaccines, such as:  Influenza vaccine. This is recommended every year.  Tetanus, diphtheria, and acellular pertussis (Tdap, Td) vaccine. You may need a Td booster every 10 years.  Zoster vaccine. You may need this after age 73.  Pneumococcal 13-valent conjugate (PCV13) vaccine. One dose is recommended after age 73.  Pneumococcal  polysaccharide (PPSV23) vaccine. One dose is recommended after age 73. Talk to your health care provider about which screenings and vaccines you need and how often you need them. This information is not intended to replace advice given to you by your health care provider. Make sure you discuss any questions you have with your health care provider. Document Released: 01/29/2015 Document Revised: 09/22/2015 Document Reviewed: 11/03/2014 Elsevier Interactive Patient Education  2017 [place] Prevention in the Home Falls can cause injuries. They can happen to people of all ages. There are many things you can do to make your home safe and to help prevent falls. What can I do on the outside of my home?  Regularly fix the edges of walkways and driveways and fix any cracks.  Remove anything that might make you trip as you walk through a door, such as a raised step or threshold.  Trim any bushes or trees on the path to your home.  Use bright outdoor lighting.  Clear any walking paths of anything that might make someone trip, such as rocks or tools.  Regularly check to see if handrails are loose or broken. Make sure that both sides of any steps have handrails.  Any raised decks and porches should have guardrails on the edges.  Have any leaves, snow, or ice cleared regularly.  Use sand or salt on walking paths during winter.  Clean up any spills in your garage right away. This includes oil or grease spills. What can I do in the bathroom?  Use night lights.  Install grab bars by the toilet and in the tub and shower. Do not use towel bars as grab bars.  Use non-skid mats or decals in the tub or shower.  If you need to sit down in the shower, use a plastic, non-slip stool.  Keep the floor dry. Clean up any water that spills on the floor as soon as it happens.  Remove soap buildup in the tub or shower regularly.  Attach bath mats securely with double-sided non-slip rug  tape.  Do not have throw rugs and other things on the floor that can make you trip. What can I do in the bedroom?  Use night lights.  Make sure that you have a light by your bed that is easy to reach.  Do not use any sheets or blankets that are too big for your bed. They should not hang down onto the floor.  Have a firm chair that has side arms. You can use this for support while you get dressed.  Do not have throw rugs and other things on the floor that can make you trip. What can I do in the kitchen?  Clean up any spills right away.  Avoid walking on wet floors.  Keep items that you use a lot in easy-to-reach places.  If you need to reach something above you, use a strong step stool that has a grab bar.  Keep electrical cords out of the way.  Do not use floor polish or wax that makes floors slippery. If you must use wax, use non-skid floor wax.  Do not have throw rugs and other things on the floor that can make you trip. What can I do  with my stairs?  Do not leave any items on the stairs.  Make sure that there are handrails on both sides of the stairs and use them. Fix handrails that are broken or loose. Make sure that handrails are as long as the stairways.  Check any carpeting to make sure that it is firmly attached to the stairs. Fix any carpet that is loose or worn.  Avoid having throw rugs at the top or bottom of the stairs. If you do have throw rugs, attach them to the floor with carpet tape.  Make sure that you have a light switch at the top of the stairs and the bottom of the stairs. If you do not have them, ask someone to add them for you. What else can I do to help prevent falls?  Wear shoes that:  Do not have high heels.  Have rubber bottoms.  Are comfortable and fit you well.  Are closed at the toe. Do not wear sandals.  If you use a stepladder:  Make sure that it is fully opened. Do not climb a closed stepladder.  Make sure that both sides of the  stepladder are locked into place.  Ask someone to hold it for you, if possible.  Clearly mark and make sure that you can see:  Any grab bars or handrails.  First and last steps.  Where the edge of each step is.  Use tools that help you move around (mobility aids) if they are needed. These include:  Canes.  Walkers.  Scooters.  Crutches.  Turn on the lights when you go into a dark area. Replace any light bulbs as soon as they burn out.  Set up your furniture so you have a clear path. Avoid moving your furniture around.  If any of your floors are uneven, fix them.  If there are any pets around you, be aware of where they are.  Review your medicines with your doctor. Some medicines can make you feel dizzy. This can increase your chance of falling. Ask your doctor what other things that you can do to help prevent falls. This information is not intended to replace advice given to you by your health care provider. Make sure you discuss any questions you have with your health care provider. Document Released: 10/29/2008 Document Revised: 06/10/2015 Document Reviewed: 02/06/2014 Elsevier Interactive Patient Education  2017 [employer].

## 2019-09-03 ENCOUNTER — Ambulatory Visit: Payer: Medicare Other | Admitting: Gastroenterology

## 2019-09-04 ENCOUNTER — Ambulatory Visit (INDEPENDENT_AMBULATORY_CARE_PROVIDER_SITE_OTHER): Payer: Medicare Other | Admitting: Gastroenterology

## 2019-09-04 ENCOUNTER — Encounter: Payer: Self-pay | Admitting: Gastroenterology

## 2019-09-04 VITALS — BP 120/60 | HR 65 | Ht 59.0 in | Wt 144.0 lb

## 2019-09-04 DIAGNOSIS — R140 Abdominal distension (gaseous): Secondary | ICD-10-CM

## 2019-09-04 DIAGNOSIS — K580 Irritable bowel syndrome with diarrhea: Secondary | ICD-10-CM

## 2019-09-04 MED ORDER — RIFAXIMIN 550 MG PO TABS
550.0000 mg | ORAL_TABLET | Freq: Three times a day (TID) | ORAL | 0 refills | Status: DC
Start: 2019-09-04 — End: 2020-01-21

## 2019-09-04 NOTE — Progress Notes (Signed)
Caitlin [name]    [id number]    Jan 10, 1946  Primary Care Physician:Panosh, Standley Brooking, MD  Referring Physician: Burnis Medin, MD [name],  [name] [id number]   Chief complaint: IBS-diarrhea, abdominal bloating  HPI:  74 year old very pleasant female here for abdominal bloating and irritable bowel syndrome diarrhea  GERD symptoms currently controlled with Dexilant daily, she has breakthrough heartburn mostly when she forgets to take [place] and also has it sometimes after Dexilant on average less than once a week and is well controlled with Tums as needed  She has significant abdominal bloating and increased bowel frequency with diarrhea, 3-4 bowel movements a day mostly in the morning.  Denies any nocturnal symptoms. She drinks on average 4 diet Cokes per day.  She was drinking regular Coke before and switch to diet drinks because her blood sugars were high and she had borderline diabetes.  She likes hard candy and sugar.  Is trying to avoid milk and dairy products as when she consumes them it makes bloating and diarrhea worse  Dicyclomine helps relieve symptoms when she takes it as needed  Colonoscopy 10/04/2016: Diverticulosis. Internal hemorrhoids. recall colonoscopy 10 years  EGD 10/04/2016: 6cm Hiatal hernia, Slipped Nissen  Denies any nausea, vomiting, abdominal pain, melena or bright red blood per rectum     Outpatient Encounter Medications as of 09/04/2019  Medication Sig  . AMBULATORY NON FORMULARY MEDICATION Medication Name: nitroglycerin 0.125% gel . Apply a pea size amount to your rectum three times daily x 6-8 weeks.  . calcium carbonate (TUMS EX) 750 MG chewable tablet Chew 1 tablet by mouth as needed for heartburn.  . cholecalciferol (VITAMIN D) 1000 UNITS tablet Take 2,000 Units by mouth daily.   [name] Dexlansoprazole (DEXILANT) 30 MG capsule Take 1 capsule (30 mg total) by mouth daily before breakfast.  . dexlansoprazole (DEXILANT)  60 MG capsule Take 1 capsule (60 mg total) by mouth daily as needed.  . dicyclomine (BENTYL) 10 MG capsule TAKE 1 CAPSULE BY MOUTH EVERY 8 HOURS AS NEEDED FOR  SPASMS  . escitalopram (LEXAPRO) 10 MG tablet Take 1 tablet by mouth once daily  . Fiber-Vitamins-Minerals (FIBERALL PO) Take 1 Units by mouth daily. Tablespoon  . hydrochlorothiazide (HYDRODIURIL) 12.5 MG tablet Take 1 tablet by mouth once daily  . potassium chloride (KLOR-CON) 10 MEQ tablet TAKE 1 TABLET BY MOUTH EVERY OTHER DAY WITH  FLUID TABLET   No facility-administered encounter medications on file as of 09/04/2019.    Allergies as of 09/04/2019 - Review Complete 09/04/2019  Allergen Reaction Noted  . Mobic [meloxicam] Diarrhea 11/28/2011  . Aleve [naproxen]  05/15/2016  . Codeine phosphate Other (See Comments) 03/15/2006  . Colestipol hcl  05/24/2017  . Omeprazole-sodium bicarbonate  12/23/2008  . Other  10/23/2017  . Protonix [pantoprazole sodium] Diarrhea 06/05/2012  . Sodium  11/16/2017  . Sulfamethoxazole Diarrhea and Nausea And Vomiting 03/15/2006    Past Medical History:  Diagnosis Date  . Allergy   . Anxiety   . Arthritis   . Blood transfusion without reported diagnosis   . Cataract    Bilateral  . Diabetes mellitus without complication (HCC)    Pre-diabetic, no meds, diet controlled  . Diverticulosis of colon   . Enteritis   . Erythema nodosum    Tends to be seasonal has had a negative chest x-ray in the past negative PPD  . Fibromyalgia   . Generalized anxiety disorder  09/01/2014  . GERD (gastroesophageal reflux disease)    History of stricture and dilatation and Nissen surgery  . Hypertension   . IBS (irritable bowel syndrome)   . 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   Qualifier: Diagnosis of  By: Regis Bill MD, Standley Brooking   . Obstructive sleep apnea   . Osteopenia     Past Surgical History:  Procedure Laterality Date  . ABDOMINAL HYSTERECTOMY    . bone spur    . CATARACT EXTRACTION W/ INTRAOCULAR  LENS  IMPLANT, BILATERAL    . CESAREAN SECTION     x 1  . CHOLECYSTECTOMY    . COLONOSCOPY    . ESOPHAGOGASTRODUODENOSCOPY     stricture and dilatation  . left shoulder surgery  06/08, 07/12/17   x 2  . MOUTH SURGERY Right 06/03/2019  . NISSEN FUNDOPLICATION    . ROTATOR CUFF REPAIR Left   . rt knee surgery     scope  . UPPER GASTROINTESTINAL ENDOSCOPY      Family History  Problem Relation Age of Onset  . Depression Mother   . Kidney cancer Mother   . Dementia Mother        Unspecified, likely secondary to other medical condition (cancer)  . Depression Father   . Hiatal hernia Father   . COPD Sister   . Dementia Sister        Unspecified, likely secondary to other medical condition  . Nephrolithiasis Other   . Colon cancer Neg Hx   . Esophageal cancer Neg Hx   . Pancreatic cancer Neg Hx   . Rectal cancer Neg Hx   . Stomach cancer Neg Hx     Social History   Socioeconomic History  . Marital status: Married    Spouse name: Not on file  . Number of children: 1  . Years of education: Not on file  . Highest education level: Not on file  Occupational History  . Occupation: Retired  Tobacco Use  . Smoking status: Never Smoker  . Smokeless tobacco: Never Used  Vaping Use  . Vaping Use: Never used  Substance and Sexual Activity  . Alcohol use: No    Alcohol/week: 0.0 standard drinks  . Drug use: No  . Sexual activity: Not on file  Other Topics Concern  . Not on file  Social History Narrative   Unemployed   Married   [place] of 2    Has grandchildren out of state   No pets   Sis. died 12-21-09 COPD   Social Determinants of Health   Financial Resource Strain: Low Risk   . Difficulty of Paying Living Expenses: Not hard at all  Food Insecurity: No Food Insecurity  . Worried About Charity fundraiser in the Last Year: Never true  . Ran Out of Food in the Last Year: Never true  Transportation Needs: No Transportation Needs  . Lack of Transportation (Medical):  No  . Lack of Transportation (Non-Medical): No  Physical Activity: Inactive  . Days of Exercise per Week: 0 days  . Minutes of Exercise per Session: 0 min  Stress: No Stress Concern Present  . Feeling of Stress : Not at all  Social Connections: Moderately Integrated  . Frequency of Communication with Friends and Family: Once a week  . Frequency of Social Gatherings with Friends and Family: Twice a week  . Attends Religious Services: More than 4 times per year  . Active Member of Clubs or Organizations: No  . Attends  Club or Organization Meetings: Never  . Marital Status: Married  Human resources officer Violence: Not At Risk  . Fear of Current or Ex-Partner: No  . Emotionally Abused: No  . Physically Abused: No  . Sexually Abused: No      Review of systems: All other review of systems negative except as mentioned in the HPI.   Physical Exam: Vitals:   09/04/19 1315  BP: 120/60  Pulse: 65   Body mass index is 29.08 kg/m. Gen:      No acute distress Neuro: alert and oriented x 3 Psych: normal mood and affect  Data Reviewed:  Reviewed labs, radiology imaging, old records and pertinent past GI work up   Assessment and Plan/Recommendations:  74 year old very pleasant female with history of chronic GERD s/p Nissen fundoplication with slipped, recurrent hiatal hernia, chronic irritable bowel syndrome predominant diarrhea and abdominal bloating  GERD: Continue Dexilant 30 mg daily Antireflux measures Okay to use over-the-counter antacids as needed for breakthrough symptoms  Abdominal bloating and IBS diarrhea: Discussed dietary changes, continue lactose-free diet Avoid soda, artificial sweeteners, high fructose corn syrup or added sugar processed foods If continues to have persistent symptoms will empirically treat with rifaximin 550 mg 3 times daily for 14 days  Abdominal cramping: Use dicyclomine 10 mg up to 3 times daily as needed  Return in 3 to 4 months or sooner if  needed   This visit required >40 minutes of patient care (this includes precharting, chart review, review of results, face-to-face time used for counseling as well as treatment plan and follow-up. The patient was provided an opportunity to ask questions and all were answered. The patient agreed with the plan and demonstrated an understanding of the instructions.  Damaris Hippo , MD    CC: Panosh, Standley Brooking, MD

## 2019-09-04 NOTE — Patient Instructions (Addendum)
AVOID Artificial sweeteners and high fructose corn syrup   STOP Fiber supplements  We have given you a printed prescription as requested for Xifaxian 550 mg   Lactose-Free Diet, Adult If you have lactose intolerance, you are not able to digest lactose. Lactose is a natural sugar found mainly in dairy milk and dairy products. You may need to avoid all foods and beverages that contain lactose. A lactose-free diet can help you do this. Which foods have lactose? Lactose is found in dairy milk and dairy products, such as:  Yogurt.  Cheese.  Butter.  Margarine.  Sour cream.  Cream.  Whipped toppings and nondairy creamers.  Ice cream and other dairy-based desserts. Lactose is also found in foods or products made with dairy milk or milk ingredients. To find out whether a food contains dairy milk or a milk ingredient, look at the ingredients list. Avoid foods with the statement "May contain milk" and foods that contain:  Milk powder.  Whey.  Curd.  Caseinate.  Lactose.  Lactalbumin.  Lactoglobulin. What are alternatives to dairy milk and foods made with milk products?  Lactose-free milk.  Soy milk with added calcium and vitamin D.  Almond milk, coconut milk, rice milk, or other nondairy milk alternatives with added calcium and vitamin D. Note that these are low in protein.  Soy products, such as soy yogurt, soy cheese, soy ice cream, and soy-based sour cream.  Other nut milk products, such as almond yogurt, almond cheese, cashew yogurt, cashew cheese, cashew ice cream, coconut yogurt, and coconut ice cream. What are tips for following this plan?  Do not consume foods, beverages, vitamins, minerals, or medicines containing lactose. Read ingredient lists carefully.  Look for the words "lactose-free" on labels.  Use lactase enzyme drops or tablets as directed by your health care provider.  Use lactose-free milk or a milk alternative, such as soy milk or almond milk,  for drinking and cooking.  Make sure you get enough calcium and vitamin D in your diet. A lactose-free eating plan can be lacking in these important nutrients.  Take calcium and vitamin D supplements as directed by your health care provider. Talk to your health care provider about supplements if you are not able to get enough calcium and vitamin D from food. What foods can I eat?  Fruits All fresh, canned, frozen, or dried fruits that are not processed with lactose. Vegetables All fresh, frozen, and canned vegetables without cheese, cream, or butter sauces. Grains Any that are not made with dairy milk or dairy products. Meats and other proteins Any meat, fish, poultry, and other protein sources that are not made with dairy milk or dairy products. Soy cheese and yogurt. Fats and oils Any that are not made with dairy milk or dairy products. Beverages Lactose-free milk. Soy, rice, or almond milk with added calcium and vitamin D. Fruit and vegetable juices. Sweets and desserts Any that are not made with dairy milk or dairy products. Seasonings and condiments Any that are not made with dairy milk or dairy products. Calcium Calcium is found in many foods that contain lactose and is important for bone health. The amount of calcium you need depends on your age:  Adults younger than 50 years: 1,000 mg of calcium a day.  Adults older than 50 years: 1,200 mg of calcium a day. If you are not getting enough calcium, you may get it from other sources, including:  Orange juice with calcium added. There are 300-350 mg of calcium  in 1 cup of orange juice.  Calcium-fortified soy milk. There are 300-400 mg of calcium in 1 cup of calcium-fortified soy milk.  Calcium-fortified rice or almond milk. There are 300 mg of calcium in 1 cup of calcium-fortified rice or almond milk.  Calcium-fortified breakfast cereals. There are 100-1,000 mg of calcium in calcium-fortified breakfast cereals.  Spinach,  cooked. There are 145 mg of calcium in  cup of cooked spinach.  Edamame, cooked. There are 130 mg of calcium in  cup of cooked edamame.  Collard greens, cooked. There are 125 mg of calcium in  cup of cooked collard greens.  Kale, frozen or cooked. There are 90 mg of calcium in  cup of cooked or frozen kale.  Almonds. There are 95 mg of calcium in  cup of almonds.  Broccoli, cooked. There are 60 mg of calcium in 1 cup of cooked broccoli. The items listed above may not be a complete list of recommended foods and beverages. Contact a dietitian for more options. What foods are not recommended? Fruits None, unless they are made with dairy milk or dairy products. Vegetables None, unless they are made with dairy milk or dairy products. Grains Any grains that are made with dairy milk or dairy products. Meats and other proteins None, unless they are made with dairy milk or dairy products. Dairy All dairy products, including milk, goat's milk, buttermilk, kefir, acidophilus milk, flavored milk, evaporated milk, condensed milk, dulce de [place], eggnog, yogurt, cheese, and cheese spreads. Fats and oils Any that are made with milk or milk products. Margarines and salad dressings that contain milk or cheese. Cream. Half and half. Cream cheese. Sour cream. Chip dips made with sour cream or yogurt. Beverages Hot chocolate. Cocoa with lactose. Instant iced teas. Powdered fruit drinks. Smoothies made with dairy milk or yogurt. Sweets and desserts Any that are made with milk or milk products. Seasonings and condiments Chewing gum that has lactose. Spice blends if they contain lactose. Artificial sweeteners that contain lactose. Nondairy creamers. The items listed above may not be a complete list of foods and beverages to avoid. Contact a dietitian for more information. Summary  If you are lactose intolerant, it means that you have a hard time digesting lactose, a natural sugar found in milk and  milk products.  Following a lactose-free diet can help you manage this condition.  Calcium is important for bone health and is found in many foods that contain lactose. Talk with your health care provider about other sources of calcium. This information is not intended to replace advice given to you by your health care provider. Make sure you discuss any questions you have with your health care provider. Document Revised: 01/30/2017 Document Reviewed: 01/30/2017 Elsevier Patient Education  [place].  Follow up in 4 months  I appreciate the  opportunity to care for you  Thank You   Harl Bowie , MD

## 2019-09-11 ENCOUNTER — Other Ambulatory Visit: Payer: Self-pay | Admitting: Internal Medicine

## 2019-09-18 ENCOUNTER — Other Ambulatory Visit: Payer: Self-pay | Admitting: Internal Medicine

## 2019-09-18 DIAGNOSIS — Z1231 Encounter for screening mammogram for malignant neoplasm of breast: Secondary | ICD-10-CM

## 2019-09-30 ENCOUNTER — Other Ambulatory Visit: Payer: Self-pay | Admitting: Gastroenterology

## 2019-10-10 ENCOUNTER — Telehealth: Payer: Self-pay | Admitting: Gastroenterology

## 2019-10-10 NOTE — Telephone Encounter (Signed)
Are you okay with her getting a booster dose while on Xifaxan?

## 2019-10-10 NOTE — Telephone Encounter (Signed)
It is fine.  Thanks

## 2019-10-23 ENCOUNTER — Other Ambulatory Visit: Payer: Self-pay

## 2019-10-23 ENCOUNTER — Ambulatory Visit
Admission: RE | Admit: 2019-10-23 | Discharge: 2019-10-23 | Disposition: A | Discharge status: Home / Self Care | Payer: Medicare Other | Source: Ambulatory Visit | Attending: Internal Medicine | Admitting: Internal Medicine

## 2019-10-23 DIAGNOSIS — Z1231 Encounter for screening mammogram for malignant neoplasm of breast: Secondary | ICD-10-CM | POA: Diagnosis not present

## 2019-11-09 ENCOUNTER — Other Ambulatory Visit: Payer: Self-pay | Admitting: Internal Medicine

## 2019-11-24 ENCOUNTER — Other Ambulatory Visit: Payer: Self-pay | Admitting: Internal Medicine

## 2019-12-08 ENCOUNTER — Other Ambulatory Visit: Payer: Self-pay | Admitting: Gastroenterology

## 2019-12-16 DIAGNOSIS — D225 Melanocytic nevi of trunk: Secondary | ICD-10-CM | POA: Diagnosis not present

## 2019-12-16 DIAGNOSIS — D485 Neoplasm of uncertain behavior of skin: Secondary | ICD-10-CM | POA: Diagnosis not present

## 2019-12-16 DIAGNOSIS — L814 Other melanin hyperpigmentation: Secondary | ICD-10-CM | POA: Diagnosis not present

## 2019-12-16 DIAGNOSIS — D2262 Melanocytic nevi of left upper limb, including shoulder: Secondary | ICD-10-CM | POA: Diagnosis not present

## 2019-12-16 DIAGNOSIS — L570 Actinic keratosis: Secondary | ICD-10-CM | POA: Diagnosis not present

## 2019-12-16 DIAGNOSIS — L821 Other seborrheic keratosis: Secondary | ICD-10-CM | POA: Diagnosis not present

## 2019-12-16 DIAGNOSIS — D1801 Hemangioma of skin and subcutaneous tissue: Secondary | ICD-10-CM | POA: Diagnosis not present

## 2019-12-18 ENCOUNTER — Encounter: Payer: Self-pay | Admitting: *Deleted

## 2020-01-12 DIAGNOSIS — H40013 Open angle with borderline findings, low risk, bilateral: Secondary | ICD-10-CM | POA: Diagnosis not present

## 2020-01-12 LAB — HM DIABETES EYE EXAM

## 2020-01-20 ENCOUNTER — Ambulatory Visit: Payer: Medicare Other | Admitting: Gastroenterology

## 2020-01-21 ENCOUNTER — Encounter: Payer: Self-pay | Admitting: Gastroenterology

## 2020-01-21 ENCOUNTER — Ambulatory Visit: Payer: Medicare Other | Admitting: Gastroenterology

## 2020-01-21 VITALS — BP 140/70 | HR 68 | Ht 58.75 in | Wt 147.1 lb

## 2020-01-21 DIAGNOSIS — K580 Irritable bowel syndrome with diarrhea: Secondary | ICD-10-CM

## 2020-01-21 DIAGNOSIS — R109 Unspecified abdominal pain: Secondary | ICD-10-CM

## 2020-01-21 DIAGNOSIS — K219 Gastro-esophageal reflux disease without esophagitis: Secondary | ICD-10-CM

## 2020-01-21 MED ORDER — DICYCLOMINE HCL 10 MG PO CAPS: ORAL_CAPSULE | ORAL | 0 refills | Status: DC

## 2020-01-21 NOTE — Patient Instructions (Addendum)
We have given you Dexilant samples today   We have refilled your Dicyclomine today   Follow up in 3 months    Gastroesophageal Reflux Disease, Adult Gastroesophageal reflux (GER) happens when acid from the stomach flows up into the tube that connects the mouth and the stomach (esophagus). Normally, food travels down the esophagus and stays in the stomach to be digested. However, when a person has GER, food and stomach acid sometimes move back up into the esophagus. If this becomes a more serious problem, the person may be diagnosed with a disease called gastroesophageal reflux disease (GERD). GERD occurs when the reflux:  Happens often.  Causes frequent or severe symptoms.  Causes problems such as damage to the esophagus. When stomach acid comes in contact with the esophagus, the acid may cause soreness (inflammation) in the esophagus. Over time, GERD may create small holes (ulcers) in the lining of the esophagus. What are the causes? This condition is caused by a problem with the muscle between the esophagus and the stomach (lower esophageal sphincter, or LES). Normally, the LES muscle closes after food passes through the esophagus to the stomach. When the LES is weakened or abnormal, it does not close properly, and that allows food and stomach acid to go back up into the esophagus. The LES can be weakened by certain dietary substances, medicines, and medical conditions, including:  Tobacco use.  Pregnancy.  Having a hiatal hernia.  Alcohol use.  Certain foods and beverages, such as coffee, chocolate, onions, and peppermint. What increases the risk? You are more likely to develop this condition if you:  Have an increased body weight.  Have a connective tissue disorder.  Use NSAID medicines. What are the signs or symptoms? Symptoms of this condition include:  Heartburn.  Difficult or painful swallowing.  The feeling of having a lump in the throat.  Abitter taste in the  mouth.  Bad breath.  Having a large amount of saliva.  Having an upset or bloated stomach.  Belching.  Chest pain. Different conditions can cause chest pain. Make sure you see your health care provider if you experience chest pain.  Shortness of breath or wheezing.  Ongoing (chronic) cough or a night-time cough.  Wearing away of tooth enamel.  Weight loss. How is this diagnosed? Your health care provider will take a medical history and perform a physical exam. To determine if you have mild or severe GERD, your health care provider may also monitor how you respond to treatment. You may also have tests, including:  A test to examine your stomach and esophagus with a small camera (endoscopy).  A test thatmeasures the acidity level in your esophagus.  A test thatmeasures how much pressure is on your esophagus.  A barium swallow or modified barium swallow test to show the shape, size, and functioning of your esophagus. How is this treated? The goal of treatment is to help relieve your symptoms and to prevent complications. Treatment for this condition may vary depending on how severe your symptoms are. Your health care provider may recommend:  Changes to your diet.  Medicine.  Surgery. Follow these instructions at home: Eating and drinking   Follow a diet as recommended by your health care provider. This may involve avoiding foods and drinks such as: ? Coffee and tea (with or without caffeine). ? Drinks that containalcohol. ? Energy drinks and sports drinks. ? Carbonated drinks or sodas. ? Chocolate and cocoa. ? Peppermint and mint flavorings. ? Garlic and onions. ?  Horseradish. ? Spicy and acidic foods, including peppers, chili powder, curry powder, vinegar, hot sauces, and barbecue sauce. ? Citrus fruit juices and citrus fruits, such as oranges, lemons, and limes. ? Tomato-based foods, such as red sauce, chili, salsa, and pizza with red sauce. ? Fried and fatty  foods, such as donuts, french fries, potato chips, and high-fat dressings. ? High-fat meats, such as hot dogs and fatty cuts of red and white meats, such as rib eye steak, sausage, ham, and bacon. ? High-fat dairy items, such as whole milk, butter, and cream cheese.  Eat small, frequent meals instead of large meals.  Avoid drinking large amounts of liquid with your meals.  Avoid eating meals during the 2-3 hours before bedtime.  Avoid lying down right after you eat.  Do not exercise right after you eat. Lifestyle   Do not use any products that contain nicotine or tobacco, such as cigarettes, e-cigarettes, and chewing tobacco. If you need help quitting, ask your health care provider.  Try to reduce your stress by using methods such as yoga or meditation. If you need help reducing stress, ask your health care provider.  If you are overweight, reduce your weight to an amount that is healthy for you. Ask your health care provider for guidance about a safe weight loss goal. General instructions  Pay attention to any changes in your symptoms.  Take over-the-counter and prescription medicines only as told by your health care provider. Do not take aspirin, ibuprofen, or other NSAIDs unless your health care provider told you to do so.  Wear loose-fitting clothing. Do not wear anything tight around your waist that causes pressure on your abdomen.  Raise (elevate) the head of your bed about 6 inches (15 cm).  Avoid bending over if this makes your symptoms worse.  Keep all follow-up visits as told by your health care provider. This is important. Contact a health care provider if:  You have: ? New symptoms. ? Unexplained weight loss. ? Difficulty swallowing or it hurts to swallow. ? Wheezing or a persistent cough. ? A hoarse voice.  Your symptoms do not improve with treatment. Get help right away if you:  Have pain in your arms, neck, jaw, teeth, or back.  Feel sweaty, dizzy, or  light-headed.  Have chest pain or shortness of breath.  Vomit and your vomit looks like blood or coffee grounds.  Faint.  Have stool that is bloody or black.  Cannot swallow, drink, or eat. Summary  Gastroesophageal reflux happens when acid from the stomach flows up into the esophagus. GERD is a disease in which the reflux happens often, causes frequent or severe symptoms, or causes problems such as damage to the esophagus.  Treatment for this condition may vary depending on how severe your symptoms are. Your health care provider may recommend diet and lifestyle changes, medicine, or surgery.  Contact a health care provider if you have new or worsening symptoms.  Take over-the-counter and prescription medicines only as told by your health care provider. Do not take aspirin, ibuprofen, or other NSAIDs unless your health care provider told you to do so.  Keep all follow-up visits as told by your health care provider. This is important. This information is not intended to replace advice given to you by your health care provider. Make sure you discuss any questions you have with your health care provider. Document Revised: 07/11/2017 Document Reviewed: 07/11/2017 Elsevier Patient Education  [employer].  I appreciate the  opportunity  to care for you  Thank You   Harl Bowie , MD

## 2020-01-21 NOTE — Progress Notes (Signed)
Caitlin [name]    [id number]    1945-12-01  Primary Care Physician:Panosh, Neta Mends, MD  Referring Physician: Madelin Headings, MD [address] [place],  [place] [address]   Chief complaint: IBS-diarrhea  HPI:  75 year old very pleasant female here for abdominal bloating and irritable bowel syndrome diarrhea  She is continue to have persistent intermittent diarrhea with no significant improvement  She tried lactose-free diet with no improvement.  She does not think any dietary changes have helped  Dicyclomine helps relieve symptoms when she takes it as needed  Colonoscopy 10/04/2016: Diverticulosis. Internal hemorrhoids. recall colonoscopy 10 years  EGD 10/04/2016: 6cm Hiatal hernia, Slipped Nissen  Denies any nausea, vomiting, abdominal pain, melena or bright red blood per rectum     Outpatient Encounter Medications as of 01/21/2020  Medication Sig  . AMBULATORY NON FORMULARY MEDICATION Medication Name: nitroglycerin 0.125% gel . Apply a pea size amount to your rectum three times daily x 6-8 weeks.  [name] amoxicillin (AMOXIL) 500 MG capsule Take 500 mg by mouth 3 (three) times daily.  . calcium carbonate (TUMS EX) 750 MG chewable tablet Chew 1 tablet by mouth as needed for heartburn.  . cholecalciferol (VITAMIN D) 1000 UNITS tablet Take 2,000 Units by mouth daily.  [name] Dexlansoprazole (DEXILANT) 30 MG capsule Take 1 capsule (30 mg total) by mouth daily before breakfast.  . dicyclomine (BENTYL) 10 MG capsule TAKE 1 CAPSULE BY MOUTH EVERY 8 HOURS AS NEEDED FOR  SPASMS  . escitalopram (LEXAPRO) 10 MG tablet Take 1 tablet by mouth once daily  . Fiber-Vitamins-Minerals (FIBERALL PO) Take 1 Units by mouth daily. teaspoon  . hydrochlorothiazide (HYDRODIURIL) 12.5 MG tablet Take 1 tablet by mouth once daily  . potassium chloride (KLOR-CON) 10 MEQ tablet TAKE 1 TABLET BY MOUTH EVERY OTHER DAY WITH FLUID TABLET  . [DISCONTINUED] rifaximin (XIFAXAN) 550 MG TABS tablet  Take 1 tablet (550 mg total) by mouth 3 (three) times daily.   No facility-administered encounter medications on file as of 01/21/2020.    Allergies as of 01/21/2020 - Review Complete 01/21/2020  Allergen Reaction Noted  . Mobic [meloxicam] Diarrhea 11/28/2011  . Aleve [naproxen]  05/15/2016  . Codeine phosphate Other (See Comments) 03/15/2006  . Colestipol hcl  05/24/2017  . Omeprazole-sodium bicarbonate  12/23/2008  . Other  10/23/2017  . Protonix [pantoprazole sodium] Diarrhea 06/05/2012  . Sodium  11/16/2017  . Sulfamethoxazole Diarrhea and Nausea And Vomiting 03/15/2006    Past Medical History:  Diagnosis Date  . Allergy   . Anxiety   . Arthritis   . Blood transfusion without reported diagnosis   . Cataract    Bilateral  . Diabetes mellitus without complication (HCC)    Pre-diabetic, no meds, diet controlled  . Diverticulosis of colon   . Enteritis   . Erythema nodosum    Tends to be seasonal has had a negative chest x-ray in the past negative PPD  . Fibromyalgia   . Generalized anxiety disorder 09/01/2014  . GERD (gastroesophageal reflux disease)    History of stricture and dilatation and Nissen surgery  . Hypertension   . IBS (irritable bowel syndrome)   . 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   Qualifier: Diagnosis of  By: Fabian Sharp MD, Neta Mends   . Obstructive sleep apnea   . Osteopenia     Past Surgical History:  Procedure Laterality Date  . ABDOMINAL HYSTERECTOMY    . bone spur    .  CATARACT EXTRACTION W/ INTRAOCULAR LENS  IMPLANT, BILATERAL    . CESAREAN SECTION     x 1  . CHOLECYSTECTOMY    . COLONOSCOPY    . ESOPHAGOGASTRODUODENOSCOPY     stricture and dilatation  . left shoulder surgery  06/08, 07/12/17   x 2  . MOUTH SURGERY Right 06/03/2019  . NISSEN FUNDOPLICATION    . ROTATOR CUFF REPAIR Left   . rt knee surgery     scope  . UPPER GASTROINTESTINAL ENDOSCOPY      Family History  Problem Relation Age of Onset  . Depression Mother   . Kidney  cancer Mother   . Dementia Mother        Unspecified, likely secondary to other medical condition (cancer)  . Depression Father   . Hiatal hernia Father   . COPD Sister   . Dementia Sister        Unspecified, likely secondary to other medical condition  . Nephrolithiasis Other   . Colon cancer Neg Hx   . Esophageal cancer Neg Hx   . Pancreatic cancer Neg Hx   . Rectal cancer Neg Hx   . Stomach cancer Neg Hx     Social History   Socioeconomic History  . Marital status: Married    Spouse name: Not on file  . Number of children: 1  . Years of education: Not on file  . Highest education level: Not on file  Occupational History  . Occupation: Retired  Tobacco Use  . Smoking status: Never Smoker  . Smokeless tobacco: Never Used  Vaping Use  . Vaping Use: Never used  Substance and Sexual Activity  . Alcohol use: No    Alcohol/week: 0.0 standard drinks  . Drug use: No  . Sexual activity: Not on file  Other Topics Concern  . Not on file  Social History Narrative   Unemployed   Married   [place] of 2    Has grandchildren out of state   No pets   Sis. died Dec 16, 2009 COPD   Social Determinants of Health   Financial Resource Strain: Low Risk   . Difficulty of Paying Living Expenses: Not hard at all  Food Insecurity: No Food Insecurity  . Worried About Charity fundraiser in the Last Year: Never true  . Ran Out of Food in the Last Year: Never true  Transportation Needs: No Transportation Needs  . Lack of Transportation (Medical): No  . Lack of Transportation (Non-Medical): No  Physical Activity: Inactive  . Days of Exercise per Week: 0 days  . Minutes of Exercise per Session: 0 min  Stress: No Stress Concern Present  . Feeling of Stress : Not at all  Social Connections: Moderately Integrated  . Frequency of Communication with Friends and Family: Once a week  . Frequency of Social Gatherings with Friends and Family: Twice a week  . Attends Religious Services: More than  4 times per year  . Active Member of Clubs or Organizations: No  . Attends Archivist Meetings: Never  . Marital Status: Married  Human resources officer Violence: Not At Risk  . Fear of Current or Ex-Partner: No  . Emotionally Abused: No  . Physically Abused: No  . Sexually Abused: No      Review of systems: All other review of systems negative except as mentioned in the HPI.   Physical Exam: Vitals:   01/21/20 1424  BP: 140/70  Pulse: 68   Body mass index is  29.97 kg/m. Gen:      No acute distress HEENT:  sclera anicteric Abd:      soft, non-tender; no palpable masses, no distension Ext:    No edema Neuro: alert and oriented x 3 Psych: normal mood and affect  Data Reviewed:  Reviewed labs, radiology imaging, old records and pertinent past GI work up   Assessment and Plan/Recommendations:  75 year old very pleasant female with chronic GERD and IBS predominant diarrhea  GERD: Continue Dexilant and antireflux measures  IBS-diarrhea: Continue dicyclomine up to 3 times daily as needed Advised patient to maintain food symptom diary, her symptoms are likely related to food intolerance but if continues to have persistent symptoms will consider colonoscopy with biopsies for further evaluation and exclude microscopic colitis  Return in 4 to 6 weeks  This visit required 20 minutes of patient care (this includes precharting, chart review, review of results, face-to-face time used for counseling as well as treatment plan and follow-up. The patient was provided an opportunity to ask questions and all were answered. The patient agreed with the plan and demonstrated an understanding of the instructions.  Damaris Hippo , MD    CC: Panosh, Standley Brooking, MD

## 2020-02-03 ENCOUNTER — Encounter: Payer: Self-pay | Admitting: Gastroenterology

## 2020-02-03 ENCOUNTER — Other Ambulatory Visit: Payer: Self-pay | Admitting: Internal Medicine

## 2020-02-17 ENCOUNTER — Other Ambulatory Visit: Payer: Self-pay | Admitting: Internal Medicine

## 2020-02-23 ENCOUNTER — Ambulatory Visit: Payer: Medicare Other | Admitting: Pulmonary Disease

## 2020-02-23 ENCOUNTER — Ambulatory Visit: Payer: Medicare Other | Admitting: Primary Care

## 2020-03-08 ENCOUNTER — Telehealth: Payer: Self-pay | Admitting: *Deleted

## 2020-03-08 MED ORDER — DEXLANSOPRAZOLE 30 MG PO CPDR: 30.0000 mg | DELAYED_RELEASE_CAPSULE | Freq: Every day | ORAL | 11 refills | Status: DC

## 2020-03-08 NOTE — Telephone Encounter (Signed)
Received fax from pharmacy for refills on [place] electronically to pharmacy Will need a prior authorization

## 2020-03-10 ENCOUNTER — Ambulatory Visit: Payer: Medicare Other | Admitting: Primary Care

## 2020-03-10 ENCOUNTER — Other Ambulatory Visit: Payer: Self-pay

## 2020-03-10 ENCOUNTER — Encounter: Payer: Self-pay | Admitting: Primary Care

## 2020-03-10 VITALS — BP 118/82 | HR 64 | Temp 97.3°F | Ht 59.0 in | Wt 146.0 lb

## 2020-03-10 DIAGNOSIS — G4733 Obstructive sleep apnea (adult) (pediatric): Secondary | ICD-10-CM

## 2020-03-10 DIAGNOSIS — Z9989 Dependence on other enabling machines and devices: Secondary | ICD-10-CM

## 2020-03-10 NOTE — Progress Notes (Signed)
@Patient  ID: Caitlin [name], female    DOB: 1945-06-18, 74 y.o.   MRN: [id number]  Chief Complaint  Patient presents with  . Follow-up    Cpap follow up insurance. Mask not fitting face, lot of leakage.     Referring provider: Burnis Medin, MD  HPI: 75 year old female, smoked.  Past medical history significant for hypertension, obstructive sleep apnea, allergic rhinitis, GERD, fibromyalgia, neurolyse anxiety disorder, prediabetes, vitamin B12 deficiency.  Patient of Dr. Ander Slade, last seen on 01/22/19 for video visit. She has had difficulty tolerating BIPAP in the past.   03/10/2020 Patient presents today for annual follow-up for OSA. She is having a lot of trouble with her bipap mask. States that it does not fit right and leaks after a few hours of wearing her mask. She is 100% compliance with BIPAP use, wearing on average 8 hours each night.  Airview download 02/07/20-03/07/20 - 30/30 days; 100% > 4 hours - Average usage 8 hours 27 mins - Pressure IPAP 14/EPAP 10 cm h20   - Airleaks 11.8L/min (95%) - AHI 3.6  Allergies  Allergen Reactions  . Mobic [Meloxicam] Diarrhea  . Aleve [Naproxen]     Upset stomach if takes more than once a day  . Codeine Phosphate Other (See Comments)    Hallucinations  . Colestipol Hcl     Patient reports reaction is "erythema multiforme"  . Omeprazole-Sodium Bicarbonate     Sever diarrhea, stomach cramps  . Other     States she can't tolerate milk, yogurt, cheese , ice cream and diarrhea  . Protonix [Pantoprazole Sodium] Diarrhea    Leg nodules  . Sodium     Other reaction(s): Other (See Comments) unknown  . Sulfamethoxazole Diarrhea and Nausea And Vomiting    Immunization History  Administered Date(s) Administered  . H1N1 03/09/2008  . Influenza Split 11/08/2010, 10/17/2011  . Influenza Whole 11/14/2006, 10/09/2007, 10/21/2008, 10/19/2009  . Influenza, High Dose Seasonal PF 10/06/2014, 10/01/2015, 10/05/2016, 10/12/2017, 10/03/2018  .  Influenza,inj,Quad PF,6+ Mos 10/11/2012, 10/14/2013  . Influenza-Unspecified 10/01/2018  . PFIZER(Purple Top)SARS-COV-2 Vaccination 03/01/2019, 03/24/2019  . PPD Test 06/05/2012  . Pneumococcal Conjugate-13 11/05/2012  . Pneumococcal Polysaccharide-23 03/09/2008, 10/06/2014  . Tdap 10/27/2011  . Zoster 08/08/2010  . Zoster Recombinat (Shingrix) 10/17/2017, 01/02/2018    Past Medical History:  Diagnosis Date  . Allergy   . Anxiety   . Arthritis   . Blood transfusion without reported diagnosis   . Cataract    Bilateral  . Diabetes mellitus without complication (HCC)    Pre-diabetic, no meds, diet controlled  . Diverticulosis of colon   . Enteritis   . Erythema nodosum    Tends to be seasonal has had a negative chest x-ray in the past negative PPD  . Fibromyalgia   . Generalized anxiety disorder 09/01/2014  . GERD (gastroesophageal reflux disease)    History of stricture and dilatation and Nissen surgery  . Hypertension   . IBS (irritable bowel syndrome)   . 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   Qualifier: Diagnosis of  By: Regis Bill MD, Standley Brooking   . Obstructive sleep apnea   . Osteopenia     Tobacco History: Social History   Tobacco Use  Smoking Status Never Smoker  Smokeless Tobacco Never Used   Counseling given: Not Answered   Outpatient Medications Prior to Visit  Medication Sig Dispense Refill  . AMBULATORY NON FORMULARY MEDICATION Medication Name: nitroglycerin 0.125% gel . Apply a pea size amount to your rectum three times  daily x 6-8 weeks. 30 g 1  . amoxicillin (AMOXIL) 500 MG capsule Take 500 mg by mouth 3 (three) times daily.    . calcium carbonate (TUMS EX) 750 MG chewable tablet Chew 1 tablet by mouth as needed for heartburn.    . cholecalciferol (VITAMIN D) 1000 UNITS tablet Take 2,000 Units by mouth daily.    [name] Dexlansoprazole (DEXILANT) 30 MG capsule Take 1 capsule (30 mg total) by mouth daily before breakfast. 30 capsule 11  . dicyclomine (BENTYL) 10 MG  capsule TAKE 1 CAPSULE BY MOUTH EVERY 8 HOURS AS NEEDED FOR  SPASMS 90 capsule 0  . escitalopram (LEXAPRO) 10 MG tablet Take 1 tablet by mouth once daily 90 tablet 0  . hydrochlorothiazide (HYDRODIURIL) 12.5 MG tablet Take 1 tablet by mouth once daily 90 tablet 0  . potassium chloride (KLOR-CON) 10 MEQ tablet TAKE 1 TABLET BY MOUTH EVERY OTHER DAY WITH  FLUID  TABLET 45 tablet 0  . Fiber-Vitamins-Minerals (FIBERALL PO) Take 1 Units by mouth daily. teaspoon (Patient not taking: Reported on 03/10/2020)     No facility-administered medications prior to visit.   Review of Systems  Review of Systems  Constitutional: Positive for fatigue.  Respiratory: Negative.   Cardiovascular: Negative.   Psychiatric/Behavioral: Negative.    Physical Exam  BP 118/82 (BP Location: Right Arm, Cuff Size: Normal)   Pulse 64   Temp (!) 97.3 F (36.3 C)   Ht 4\' 11"  (1.499 m)   Wt 146 lb (66.2 kg)   SpO2 98%   BMI 29.49 kg/m  Physical Exam Constitutional:      Appearance: Normal appearance.  HENT:     Head: Normocephalic and atraumatic.     Mouth/Throat:     Mouth: Mucous membranes are moist.     Pharynx: Oropharynx is clear.  Cardiovascular:     Rate and Rhythm: Normal rate and regular rhythm.  Pulmonary:     Effort: Pulmonary effort is normal.     Breath sounds: Normal breath sounds.  Neurological:     General: No focal deficit present.     Mental Status: She is alert and oriented to person, place, and time. Mental status is at baseline.  Psychiatric:        Mood and Affect: Mood normal.        Behavior: Behavior normal.        Thought Content: Thought content normal.        Judgment: Judgment normal.      Lab Results:  CBC    Component Value Date/Time   WBC 7.9 05/07/2019 1509   RBC 4.64 05/07/2019 1509   HGB 13.4 05/07/2019 1509   HCT 40.8 05/07/2019 1509   PLT 276.0 05/07/2019 1509   MCV 87.9 05/07/2019 1509   MCH 29.1 04/28/2016 1832   MCHC 32.9 05/07/2019 1509   RDW 14.8  05/07/2019 1509   LYMPHSABS 2.0 05/07/2019 1509   MONOABS 0.5 05/07/2019 1509   EOSABS 0.2 05/07/2019 1509   BASOSABS 0.0 05/07/2019 1509    BMET    Component Value Date/Time   NA 137 05/07/2019 1509   K 3.9 05/07/2019 1509   CL 97 05/07/2019 1509   CO2 29 05/07/2019 1509   GLUCOSE 86 05/07/2019 1509   BUN 13 05/07/2019 1509   CREATININE 0.84 05/07/2019 1509   CALCIUM 9.7 05/07/2019 1509   CALCIUM 10.4 05/13/2010 1225   GFRNONAA 48 (L) 04/28/2016 1832   GFRAA 56 (L) 04/28/2016 1832    BNP  No results found for: BNP  ProBNP No results found for: PROBNP  Imaging: No results found.   Assessment & Plan:   Obstructive sleep apnea - Patient is 100% compliant with BIPAP use; having difficulty with mask fit and airleaks - Pressure 14/10; residual AHI 3.6. Airleaks 11.8L/min (95%).  - The airleaks that she is experiences is not impacting her use of BIPAP or her AHI. Recommend patient try adjusting mask when lying down at night and try mask liners. If this does not help with airleaks she will need mask fitting  - FU in 1 year with Dr. Ander Slade, or sooner with him if needed   >30 mins was spent face to face with patient   Martyn Ehrich, NP 03/10/2020

## 2020-03-10 NOTE — Assessment & Plan Note (Addendum)
-   Patient is 100% compliant with BIPAP use; having difficulty with mask fit and airleaks - Pressure 14/10; residual AHI 3.6. Airleaks 11.8L/min (95%).  - The airleaks that she is experiences is not impacting her use of BIPAP or her AHI. Recommend patient try adjusting mask when lying down at night and try mask liners. If this does not help with airleaks she will need mask fitting  - FU in 1 year with Dr. Ander Slade, or sooner with him if needed

## 2020-03-10 NOTE — Patient Instructions (Signed)
Recommendations: - Continue to wear CPAP every night for 4-6 hours or longer - Try adjusting masking when lying down at night to get a better fit - Try cpap mask liners to see if this can help with airleaks - If none of these work we need to send you for a mask fitting   Orders: - Renew CPAP supplies and provide patient with CPAP mask liners   Follow-up: - 1 year with Dr. Ander Slade or sooner with him if still having issues    CPAP and BPAP Information CPAP and BPAP are methods that use air pressure to keep your airways open and to help you breathe well. CPAP and BPAP use different amounts of pressure. Your health care provider will tell you whether CPAP or BPAP would be more helpful for you.  CPAP stands for "continuous positive airway pressure." With CPAP, the amount of pressure stays the same while you breathe in and out.  BPAP stands for "bi-level positive airway pressure." With BPAP, the amount of pressure will be higher when you breathe in (inhale) and lower when you breathe out(exhale). This allows you to take larger breaths. CPAP or BPAP may be used in the hospital, or your health care provider may want you to use it at home. You may need to have a sleep study before your health care provider can order a machine for you to use at home. Why are CPAP and BPAP treatments used? CPAP or BPAP can be helpful if you have:  Sleep apnea.  Chronic obstructive pulmonary disease (COPD).  Heart failure.  Medical conditions that cause muscle weakness, including muscular dystrophy or amyotrophic lateral sclerosis (ALS).  Other problems that cause breathing to be shallow, weak, abnormal, or difficult. CPAP and BPAP are most commonly used for obstructive sleep apnea (OSA) to keep the airways from collapsing when the muscles relax during sleep. How is CPAP or BPAP administered? Both CPAP and BPAP are provided by a small machine with a flexible plastic tube that attaches to a plastic mask that you  wear. Air is blown through the mask into your nose or mouth. The amount of pressure that is used to blow the air can be adjusted on the machine. Your health care provider will set the pressure setting and help you find the best mask for you. When should CPAP or BPAP be used? In most cases, the mask only needs to be worn during sleep. Generally, the mask needs to be worn throughout the night and during any daytime naps. People with certain medical conditions may also need to wear the mask at other times when they are awake. Follow instructions from your health care provider about when to use the machine. What are some tips for using the mask?  Because the mask needs to be snug, some people feel trapped or closed-in (claustrophobic) when first using the mask. If you feel this way, you may need to get used to the mask. One way to do this is to hold the mask loosely over your nose or mouth and then gradually apply the mask more snugly. You can also gradually increase the amount of time that you use the mask.  Masks are available in various types and sizes. If your mask does not fit well, talk with your health care provider about getting a different one. Some common types of masks include: ? Full face masks, which fit over the mouth and nose. ? Nasal masks, which fit over the nose. ? Nasal pillow or  prong masks, which fit into the nostrils.  If you are using a mask that fits over your nose and you tend to breathe through your mouth, a chin strap may be applied to help keep your mouth closed.  Some CPAP and BPAP machines have alarms that may sound if the mask comes off or develops a leak.  If you have trouble with the mask, it is very important that you talk with your health care provider about finding a way to make the mask easier to tolerate. Do not stop using the mask. There could be a negative impact to your health if you stop using the mask.   What are some tips for using the machine?  Place your  CPAP or BPAP machine on a secure table or stand near an electrical outlet.  Know where the on/off switch is on the machine.  Follow instructions from your health care provider about how to set the pressure on your machine and when you should use it.  Do not eat or drink while the CPAP or BPAP machine is on. Food or fluids could get pushed into your lungs by the pressure of the CPAP or BPAP.  For home use, CPAP and BPAP machines can be rented or purchased through home health care companies. Many different brands of machines are available. Renting a machine before purchasing may help you find out which particular machine works well for you. Your insurance may also decide which machine you may get.  Keep the CPAP or BPAP machine and attachments clean. Ask your health care provider for specific instructions. Follow these instructions at home:  Do not use any products that contain nicotine or tobacco, such as cigarettes, e-cigarettes, and chewing tobacco. If you need help quitting, ask your health care provider.  Keep all follow-up visits as told by your health care provider. This is important. Contact a health care provider if:  You have redness or pressure sores on your head, face, mouth, or nose from the mask or head gear.  You have trouble using the CPAP or BPAP machine.  You cannot tolerate wearing the CPAP or BPAP mask.  Someone tells you that you snore even when wearing your CPAP or BPAP. Get help right away if:  You have trouble breathing.  You feel confused. Summary  CPAP and BPAP are methods that use air pressure to keep your airways open and to help you breathe well.  You may need to have a sleep study before your health care provider can order a machine for home use.  If you have trouble with the mask, it is very important that you talk with your health care provider about finding a way to make the mask easier to tolerate. Do not stop using the mask. There could be a  negative impact to your health if you stop using the mask.  Follow instructions from your health care provider about when to use the machine. This information is not intended to replace advice given to you by your health care provider. Make sure you discuss any questions you have with your health care provider. Document Revised: 01/24/2019 Document Reviewed: 01/27/2019 Elsevier Patient Education  2021 [employer].

## 2020-03-24 ENCOUNTER — Other Ambulatory Visit: Payer: Self-pay

## 2020-03-24 ENCOUNTER — Ambulatory Visit: Payer: Medicare Other | Admitting: Neurology

## 2020-03-24 ENCOUNTER — Encounter: Payer: Self-pay | Admitting: Neurology

## 2020-03-24 VITALS — BP 137/78 | HR 71 | Ht <= 58 in | Wt 146.0 lb

## 2020-03-24 DIAGNOSIS — G3184 Mild cognitive impairment, so stated: Secondary | ICD-10-CM

## 2020-03-24 DIAGNOSIS — F0670 Mild neurocognitive disorder due to known physiological condition without behavioral disturbance: Secondary | ICD-10-CM

## 2020-03-24 NOTE — Patient Instructions (Addendum)
1. Schedule repeat Neurocognitive testing  You have been referred for a neuropsychological evaluation (i.e., evaluation of memory and thinking abilities). Please bring someone with you to this appointment if possible, as it is helpful for the doctor to hear from both you and another adult who knows you well. Please bring eyeglasses and hearing aids if you wear them.    The evaluation will take approximately 3 hours and has two parts:   . The first part is a clinical interview with the neuropsychologist (Dr. Melvyn Novas or Dr. Nicole Kindred). During the interview, the neuropsychologist will speak with you and the individual you brought to the appointment.    . The second part of the evaluation is testing with the doctor's technician Hinton Dyer or Maudie Mercury). During the testing, the technician will ask you to remember different types of material, solve problems, and answer some questionnaires. Your family member will not be present for this portion of the evaluation.   Please note: We must reserve several hours of the neuropsychologist's time and the psychometrician's time for your evaluation appointment. As such, there is a No-Show fee of $100. If you are unable to attend any of your appointments, please contact our office as soon as possible to reschedule.  2. Follow-up in 6-8 months, call for any changes    RECOMMENDATIONS FOR ALL PATIENTS WITH MEMORY PROBLEMS: 1. Continue to exercise (Recommend 30 minutes of walking everyday, or 3 hours every week) 2. Increase social interactions - continue going to [place] and enjoy social gatherings with friends and family 3. Eat healthy, avoid fried foods and eat more fruits and vegetables 4. Maintain adequate blood pressure, blood sugar, and blood cholesterol level. Reducing the risk of stroke and cardiovascular disease also helps promoting better memory. 5. Avoid stressful situations. Live a simple life and avoid aggravations. Organize your time and prepare for the next day in  anticipation. 6. Sleep well, avoid any interruptions of sleep and avoid any distractions in the bedroom that may interfere with adequate sleep quality 7. Avoid sugar, avoid sweets as there is a strong link between excessive sugar intake, diabetes, and cognitive impairment The Mediterranean diet has been shown to help patients reduce the risk of progressive memory disorders and reduces cardiovascular risk. This includes eating fish, eat fruits and green leafy vegetables, nuts like almonds and hazelnuts, walnuts, and also use olive oil. Avoid fast foods and fried foods as much as possible. Avoid sweets and sugar as sugar use has been linked to worsening of memory function.

## 2020-03-24 NOTE — Progress Notes (Signed)
NEUROLOGY FOLLOW UP OFFICE NOTE  Caitlin [name] [id number] [birth year]  HISTORY OF PRESENT ILLNESS: I had the pleasure of seeing Akira Perusse in follow-up in the neurology clinic on 03/24/2020.  The patient was last seen a 75 year ago for worsening memory. 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 Performance across visuospatial tasks declined with increased complexity, likely due to the presence of bilateral cataracts and ongoing visual acuity difficulties." There was no evidence of a neurodegenerative condition. Etiology of cognitive difficulties multifactorial, including significant psychiatric distress, chronic pain due to fibromyalgia, and variable sleep disturbances. She has seen her sleep specialist with note of 100% BiPAP compliance, there is airleak but not affecting her AHI. She feels her memory is about the same, not worse. She denies getting lost driving, her husband drove today. She occasionally forgets her medications then remembers when she is in bed. She denies missing any bill payments. She reports her husband thinks "I'm on the path to being in a facility, he is very anxious." Her IBS symptoms have not improved, pain level 8-9 some days. She has alternating diarrhea and constipation. She denies any headaches, dizziness, focal numbness/tingling/weakness, no falls. She has nightmares now.    History on Initial Assessment 09/01/2014: This is a 75 yo RH woman with a history of hypertension, diet-controlled hyperlipidemia, IBS, GERD, chronic neck and back pain, depression and anxiety, who presented for worsening memory. She reports that memory changes started several years ago, she would be unable to  think of a name or word she wanted to say. She has had to write everything down otherwise she would forget appointments. She recalls the first time this happened was 12 years ago, she got a reminder call about a dentist appointment 2 days prior but still missed it. She is has been stressed and worried because 2 of her husband's family members have recently passed away with dementia. She continues to drive without getting lost. She denies missing her regular medications but occasionally forgets her vitamins. She has to stay by the stove when cooking, one time she walked away from soup she was making and forgot about it. Her husband is in charge of bill payments. She has always had problems multi-tasking, but has noticed this has been worse the past couple of years, she easily loses her train of thought when distracted. Her maternal grandmother and 2 brothers were diagnosed with dementia. Her mother and sister had memory problems before they passed away from other medical conditions. She denies any significant head injuries, no alcohol use.   Diagnostic Data: MRI brain in 2012 was personally reviewed today, unremarkable with mild diffuse atrophy. Similar to MRI brain done 08/2014 as above.  PAST MEDICAL HISTORY: Past Medical History:  Diagnosis Date  . Allergy   . Anxiety   . Arthritis   . Blood transfusion without reported diagnosis   . Cataract    Bilateral  . Diabetes mellitus without complication (HCC)    Pre-diabetic, no meds, diet controlled  . Diverticulosis of colon   . Enteritis   . Erythema nodosum    Tends to be seasonal has had a negative chest x-ray in the past negative PPD  . Fibromyalgia   . Generalized anxiety disorder 09/01/2014  . GERD (gastroesophageal reflux disease)    History of  stricture and dilatation and Nissen surgery  . Hypertension   . IBS (irritable bowel syndrome)   . 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   Qualifier: Diagnosis of  By: Regis Bill MD, Standley Brooking   .  Obstructive sleep apnea   . Osteopenia     MEDICATIONS: Current Outpatient Medications on File Prior to Visit  Medication Sig Dispense Refill  . AMBULATORY NON FORMULARY MEDICATION Medication Name: nitroglycerin 0.125% gel . Apply a pea size amount to your rectum three times daily x 6-8 weeks. 30 g 1  . amoxicillin (AMOXIL) 500 MG capsule Take 500 mg by mouth 3 (three) times daily.    . calcium carbonate (TUMS EX) 750 MG chewable tablet Chew 1 tablet by mouth as needed for heartburn.    . cholecalciferol (VITAMIN D) 1000 UNITS tablet Take 2,000 Units by mouth daily.    [name] Dexlansoprazole (DEXILANT) 30 MG capsule Take 1 capsule (30 mg total) by mouth daily before breakfast. 30 capsule 11  . dicyclomine (BENTYL) 10 MG capsule TAKE 1 CAPSULE BY MOUTH EVERY 8 HOURS AS NEEDED FOR  SPASMS 90 capsule 0  . escitalopram (LEXAPRO) 10 MG tablet Take 1 tablet by mouth once daily 90 tablet 0  . Fiber-Vitamins-Minerals (FIBERALL PO) Take 1 Units by mouth daily. teaspoon (Patient not taking: Reported on 03/10/2020)    . hydrochlorothiazide (HYDRODIURIL) 12.5 MG tablet Take 1 tablet by mouth once daily 90 tablet 0  . potassium chloride (KLOR-CON) 10 MEQ tablet TAKE 1 TABLET BY MOUTH EVERY OTHER DAY WITH  FLUID  TABLET 45 tablet 0   No current facility-administered medications on file prior to visit.    ALLERGIES: Allergies  Allergen Reactions  . Mobic [Meloxicam] Diarrhea  . Aleve [Naproxen]     Upset stomach if takes more than once a day  . Codeine Phosphate Other (See Comments)    Hallucinations  . Colestipol Hcl     Patient reports reaction is "erythema multiforme"  . Omeprazole-Sodium Bicarbonate     Sever diarrhea, stomach cramps  . Other     States she can't tolerate milk, yogurt, cheese , ice cream and diarrhea  . Protonix [Pantoprazole Sodium] Diarrhea    Leg nodules  . Sodium     Other reaction(s): Other (See Comments) unknown  . Sulfamethoxazole Diarrhea and Nausea And Vomiting     FAMILY HISTORY: Family History  Problem Relation Age of Onset  . Depression Mother   . Kidney cancer Mother   . Dementia Mother        Unspecified, likely secondary to other medical condition (cancer)  . Depression Father   . Hiatal hernia Father   . COPD Sister   . Dementia Sister        Unspecified, likely secondary to other medical condition  . Nephrolithiasis Other   . Colon cancer Neg Hx   . Esophageal cancer Neg Hx   . Pancreatic cancer Neg Hx   . Rectal cancer Neg Hx   . Stomach cancer Neg Hx     SOCIAL HISTORY: Social History   Socioeconomic History  . Marital status: Married    Spouse name: Not on file  . Number of children: 1  . Years of education: Not on file  . Highest education level: Not on file  Occupational History  . Occupation: Retired  Tobacco Use  . Smoking status: Never Smoker  . Smokeless tobacco: Never Used  Vaping Use  . Vaping Use: Never used  Substance and Sexual Activity  .  Alcohol use: No    Alcohol/week: 0.0 standard drinks  . Drug use: No  . Sexual activity: Not on file  Other Topics Concern  . Not on file  Social History Narrative   Unemployed   Married   [place] of 2    Has grandchildren out of state   No pets   Sis. died 12-11-09 COPD   Social Determinants of Health   Financial Resource Strain: Low Risk   . Difficulty of Paying Living Expenses: Not hard at all  Food Insecurity: No Food Insecurity  . Worried About Charity fundraiser in the Last Year: Never true  . Ran Out of Food in the Last Year: Never true  Transportation Needs: No Transportation Needs  . Lack of Transportation (Medical): No  . Lack of Transportation (Non-Medical): No  Physical Activity: Inactive  . Days of Exercise per Week: 0 days  . Minutes of Exercise per Session: 0 min  Stress: No Stress Concern Present  . Feeling of Stress : Not at all  Social Connections: Moderately Integrated  . Frequency of Communication with Friends and Family: Once a  week  . Frequency of Social Gatherings with Friends and Family: Twice a week  . Attends Religious Services: More than 4 times per year  . Active Member of Clubs or Organizations: No  . Attends Archivist Meetings: Never  . Marital Status: Married  Human resources officer Violence: Not At Risk  . Fear of Current or Ex-Partner: No  . Emotionally Abused: No  . Physically Abused: No  . Sexually Abused: No     PHYSICAL EXAM: Vitals:   03/24/20 1505  BP: 137/78  Pulse: 71  SpO2: 98%   General: No acute distress Head:  Normocephalic/atraumatic Skin/Extremities: No rash, no edema Neurological Exam: alert and oriented to person, place, and time. No aphasia or dysarthria. Fund of knowledge is appropriate.  Recent and remote memory are intact.  Attention and concentration are normal.  MMSE - Mini Mental State Exam 03/24/2020 03/12/2018 10/23/2017  Not completed: - - (No Data)  Orientation to time 5 5 -  Orientation to Place 5 5 -  Registration 3 3 -  Attention/ Calculation 3 3 -  Recall 3 3 -  Language- name 2 objects 2 2 -  Language- repeat 1 1 -  Language- follow 3 step command 3 3 -  Language- read & follow direction 1 1 -  Write a sentence 1 1 -  Copy design 1 1 -  Total score 28 28 -   Cranial nerves: Pupils equal, round. Extraocular movements intact.  No facial asymmetry.  Motor: moves all extremities symmetrically. Gait narrow-based and steady, no ataxia   IMPRESSION: This is a 75 yo RH woman with a history of hypertension, diet-controlled hyperlipidemia, depression, anxiety, GERD,who presented for worsening memory loss. MRI brain unremarkable, TSH and B12 normal. 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 MMSE today 28/30. We had discussed repeating Neurocognitive testing to assess trajectory.  Follow-up in 6-8 months, she knows to call for any changes.   Thank you for allowing me to participate in her care.  Please do not hesitate to call for any questions or concerns.   [name], M.D.   CC: Dr. Regis Bill

## 2020-04-12 ENCOUNTER — Telehealth: Payer: Self-pay | Admitting: Pulmonary Disease

## 2020-04-12 NOTE — Telephone Encounter (Signed)
I do not see anything on the download that looks concerning  Continue using your BiPAP, of course she can skip if you are afraid of the noise it is causing  If there is anything wrong with the machine, it will still need to be addressed by [facility]

## 2020-04-12 NOTE — Telephone Encounter (Signed)
pt is calling because she is having issues with her bipap... Pt states last night the machine was making a weird noise & it sounded internal... advise pt to reach out to DME for further instructions on BIPAP repairs. Also mentioned that on 3/27  her power was off for 2 hours, so Bipap machine was off. Concerns about what impact that could have on her health as well as her reading.. pt is concerned about if she's getting enough air at night..   please advise [phone]

## 2020-04-12 NOTE — Telephone Encounter (Signed)
Called and spoke with patient to let her know of recs from Dr. Ander Slade. She expressed understanding and stated that she is waiting to hear back from [employer] on machine as well. Nothing further needed at this time.

## 2020-04-12 NOTE — Telephone Encounter (Signed)
Spoke with the pt  She states that her BIPAP started making a strange noise this morning  She is concerned something went wrong with it after they lost power for 2 hours on 04/11/20  She states she has already placed a call to [employer] and waiting on them to call her back  She wanted DL to be reviewed to make sure her settings look okay  DL printed and given to Dr Ander Slade to review  Thanks

## 2020-04-18 ENCOUNTER — Other Ambulatory Visit: Payer: Self-pay | Admitting: Gastroenterology

## 2020-04-27 ENCOUNTER — Telehealth: Payer: Self-pay | Admitting: Gastroenterology

## 2020-04-27 NOTE — Telephone Encounter (Signed)
After May 1st Dexilant will not be covered, patient has one refill order to pick up at her pharmacy that's $90. She said she is going to go ahead and pick that up so she will have it. She will contact the office after May 1st for a new rx of pantoprazole or another PPI. She has tried Nexium in the past

## 2020-05-11 ENCOUNTER — Other Ambulatory Visit: Payer: Self-pay | Admitting: Internal Medicine

## 2020-05-11 ENCOUNTER — Other Ambulatory Visit: Payer: Self-pay

## 2020-05-11 NOTE — Progress Notes (Deleted)
No chief complaint on file.   HPI: Caitlin [name] 75 y.o. come in for Chronic disease management  ROS: See pertinent positives and negatives per HPI.  Past Medical History:  Diagnosis Date  . Allergy   . Anxiety   . Arthritis   . Blood transfusion without reported diagnosis   . Cataract    Bilateral  . Diabetes mellitus without complication (HCC)    Pre-diabetic, no meds, diet controlled  . Diverticulosis of colon   . Enteritis   . Erythema nodosum    Tends to be seasonal has had a negative chest x-ray in the past negative PPD  . Fibromyalgia   . Generalized anxiety disorder 09/01/2014  . GERD (gastroesophageal reflux disease)    History of stricture and dilatation and Nissen surgery  . Hypertension   . IBS (irritable bowel syndrome)   . 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   Qualifier: Diagnosis of  By: Regis Bill MD, Standley Brooking   . Obstructive sleep apnea   . Osteopenia     Family History  Problem Relation Age of Onset  . Depression Mother   . Kidney cancer Mother   . Dementia Mother        Unspecified, likely secondary to other medical condition (cancer)  . Depression Father   . Hiatal hernia Father   . COPD Sister   . Dementia Sister        Unspecified, likely secondary to other medical condition  . Nephrolithiasis Other   . Colon cancer Neg Hx   . Esophageal cancer Neg Hx   . Pancreatic cancer Neg Hx   . Rectal cancer Neg Hx   . Stomach cancer Neg Hx     Social History   Socioeconomic History  . Marital status: Married    Spouse name: Not on file  . Number of children: 1  . Years of education: Not on file  . Highest education level: Not on file  Occupational History  . Occupation: Retired  Tobacco Use  . Smoking status: Never Smoker  . Smokeless tobacco: Never Used  Vaping Use  . Vaping Use: Never used  Substance and Sexual Activity  . Alcohol use: No    Alcohol/week: 0.0 standard drinks  . Drug use: No  . Sexual activity: Not on file  Other  Topics Concern  . Not on file  Social History Narrative   Unemployed   Married   [place] of 2    Has grandchildren out of state   No pets   Sis. died 2009/12/09 COPD      Right handed   Social Determinants of Health   Financial Resource Strain: Low Risk   . Difficulty of Paying Living Expenses: Not hard at all  Food Insecurity: No Food Insecurity  . Worried About Charity fundraiser in the Last Year: Never true  . Ran Out of Food in the Last Year: Never true  Transportation Needs: No Transportation Needs  . Lack of Transportation (Medical): No  . Lack of Transportation (Non-Medical): No  Physical Activity: Inactive  . Days of Exercise per Week: 0 days  . Minutes of Exercise per Session: 0 min  Stress: No Stress Concern Present  . Feeling of Stress : Not at all  Social Connections: Moderately Integrated  . Frequency of Communication with Friends and Family: Once a week  . Frequency of Social Gatherings with Friends and Family: Twice a week  . Attends Religious Services: More than 4 times per  year  . Active Member of Clubs or Organizations: No  . Attends Archivist Meetings: Never  . Marital Status: Married    Outpatient Medications Prior to Visit  Medication Sig Dispense Refill  . AMBULATORY NON FORMULARY MEDICATION Medication Name: nitroglycerin 0.125% gel . Apply a pea size amount to your rectum three times daily x 6-8 weeks. 30 g 1  . calcium carbonate (TUMS EX) 750 MG chewable tablet Chew 1 tablet by mouth as needed for heartburn.    . cholecalciferol (VITAMIN D) 1000 UNITS tablet Take 2,000 Units by mouth daily.    [name] Dexlansoprazole (DEXILANT) 30 MG capsule Take 1 capsule (30 mg total) by mouth daily before breakfast. 30 capsule 11  . dicyclomine (BENTYL) 10 MG capsule TAKE 1 CAPSULE BY MOUTH EVERY 8 HOURS AS NEEDED FOR  SPASMS 90 capsule 0  . escitalopram (LEXAPRO) 10 MG tablet Take 1 tablet by mouth once daily 90 tablet 0  . hydrochlorothiazide (HYDRODIURIL)  12.5 MG tablet Take 1 tablet by mouth once daily 90 tablet 0  . potassium chloride (KLOR-CON) 10 MEQ tablet TAKE 1 TABLET BY MOUTH EVERY OTHER DAY WITH  FLUID  TABLET 45 tablet 0   No facility-administered medications prior to visit.     EXAM:  There were no vitals taken for this visit.  There is no height or weight on file to calculate BMI.  GENERAL: vitals reviewed and listed above, alert, oriented, appears well hydrated and in no acute distress HEENT: atraumatic, conjunctiva  clear, no obvious abnormalities on inspection of external nose and ears OP : no lesion edema or exudate  NECK: no obvious masses on inspection palpation  LUNGS: clear to auscultation bilaterally, no wheezes, rales or rhonchi, good air movement CV: HRRR, no clubbing cyanosis or  peripheral edema nl cap refill  MS: moves all extremities without noticeable focal  abnormality PSYCH: pleasant and cooperative, no obvious depression or anxiety Lab Results  Component Value Date   WBC 7.9 05/07/2019   HGB 13.4 05/07/2019   HCT 40.8 05/07/2019   PLT 276.0 05/07/2019   GLUCOSE 86 05/07/2019   CHOL 164 05/07/2019   TRIG 76.0 05/07/2019   HDL 85.90 05/07/2019   LDLCALC 63 05/07/2019   ALT 16 05/07/2019   AST 16 05/07/2019   NA 137 05/07/2019   K 3.9 05/07/2019   CL 97 05/07/2019   CREATININE 0.84 05/07/2019   BUN 13 05/07/2019   CO2 29 05/07/2019   TSH 2.01 05/07/2019   HGBA1C 5.5 05/07/2019   MICROALBUR 1.6 04/26/2011   BP Readings from Last 3 Encounters:  03/24/20 137/78  03/10/20 118/82  01/21/20 140/70    ASSESSMENT AND PLAN:  Discussed the following assessment and plan:  No diagnosis found.  -Patient advised to return or notify health care team  if  new concerns arise.  There are no Patient Instructions on file for this visit.   Standley Brooking. Maricarmen Braziel M.D.

## 2020-05-12 ENCOUNTER — Ambulatory Visit: Payer: Medicare Other | Admitting: Internal Medicine

## 2020-05-17 ENCOUNTER — Ambulatory Visit: Payer: Medicare Other | Admitting: Internal Medicine

## 2020-05-17 ENCOUNTER — Other Ambulatory Visit: Payer: Self-pay

## 2020-05-17 ENCOUNTER — Encounter: Payer: Self-pay | Admitting: Internal Medicine

## 2020-05-17 VITALS — BP 122/64 | HR 73 | Temp 98.4°F | Ht <= 58 in | Wt 145.4 lb

## 2020-05-17 DIAGNOSIS — Z79899 Other long term (current) drug therapy: Secondary | ICD-10-CM | POA: Diagnosis not present

## 2020-05-17 DIAGNOSIS — R7303 Prediabetes: Secondary | ICD-10-CM | POA: Diagnosis not present

## 2020-05-17 DIAGNOSIS — F0670 Mild neurocognitive disorder due to known physiological condition without behavioral disturbance: Secondary | ICD-10-CM

## 2020-05-17 DIAGNOSIS — R21 Rash and other nonspecific skin eruption: Secondary | ICD-10-CM

## 2020-05-17 DIAGNOSIS — F411 Generalized anxiety disorder: Secondary | ICD-10-CM | POA: Diagnosis not present

## 2020-05-17 DIAGNOSIS — G3184 Mild cognitive impairment, so stated: Secondary | ICD-10-CM

## 2020-05-17 DIAGNOSIS — I10 Essential (primary) hypertension: Secondary | ICD-10-CM | POA: Diagnosis not present

## 2020-05-17 MED ORDER — POTASSIUM CHLORIDE CRYS ER 10 MEQ PO TBCR: EXTENDED_RELEASE_TABLET | ORAL | 2 refills | Status: DC

## 2020-05-17 MED ORDER — HYDROCHLOROTHIAZIDE 12.5 MG PO TABS: 12.5000 mg | ORAL_TABLET | Freq: Every day | ORAL | 2 refills | Status: DC

## 2020-05-17 MED ORDER — ESCITALOPRAM OXALATE 10 MG PO TABS: 1.0000 | ORAL_TABLET | Freq: Every day | ORAL | 2 refills | Status: DC

## 2020-05-17 NOTE — Patient Instructions (Addendum)
Lab  Today monitoring.  Work on getting   Sleep apnea treatment.  Use  Cream or water ointment such as aquafor   To itchy patch and then    Cortisone  Topical for itching   And if not better  Then see dermatology.   I f anxiety  Too much we can increase to 15 mg per day .   Plan follow up  Depending  On results .

## 2020-05-17 NOTE — Progress Notes (Signed)
Chief Complaint  Patient presents with  . Medication Refill  . Hypertension  . Medication Management    HPI: Caitlin [name] 75 y.o. come in for Chronic disease management appointment moved forward from last week. Where she went to the wrong office   And had to reschedule   Lexapro HCTZ and potassium prescribed. Needs refill  Has seen Dr. Delice Lesch for mild neurocognitive disorder due to multiple etiologies and under treatment for OSA on CPAP which has been helpful. But not now   Mask not fitting right. Last appointment with me was April 2021 for leg pain. Anxiety is a problem  Has fu  Neuro psych  Husband also has  Some memory issues  Itchy patch LLE    Last derm check was earlier this year  And not for this.  Bp ok  ROS: See pertinent positives and negatives per HPI.  Past Medical History:  Diagnosis Date  . Allergy   . Anxiety   . Arthritis   . Blood transfusion without reported diagnosis   . Cataract    Bilateral  . Diabetes mellitus without complication (HCC)    Pre-diabetic, no meds, diet controlled  . Diverticulosis of colon   . Enteritis   . Erythema nodosum    Tends to be seasonal has had a negative chest x-ray in the past negative PPD  . Fibromyalgia   . Generalized anxiety disorder 09/01/2014  . GERD (gastroesophageal reflux disease)    History of stricture and dilatation and Nissen surgery  . Hypertension   . IBS (irritable bowel syndrome)   . 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   Qualifier: Diagnosis of  By: Regis Bill MD, Standley Brooking   . Obstructive sleep apnea   . Osteopenia     Family History  Problem Relation Age of Onset  . Depression Mother   . Kidney cancer Mother   . Dementia Mother        Unspecified, likely secondary to other medical condition (cancer)  . Depression Father   . Hiatal hernia Father   . COPD Sister   . Dementia Sister        Unspecified, likely secondary to other medical condition  . Nephrolithiasis Other   . Colon cancer Neg Hx    . Esophageal cancer Neg Hx   . Pancreatic cancer Neg Hx   . Rectal cancer Neg Hx   . Stomach cancer Neg Hx     Social History   Socioeconomic History  . Marital status: Married    Spouse name: Not on file  . Number of children: 1  . Years of education: Not on file  . Highest education level: Not on file  Occupational History  . Occupation: Retired  Tobacco Use  . Smoking status: Never Smoker  . Smokeless tobacco: Never Used  Vaping Use  . Vaping Use: Never used  Substance and Sexual Activity  . Alcohol use: No    Alcohol/week: 0.0 standard drinks  . Drug use: No  . Sexual activity: Not on file  Other Topics Concern  . Not on file  Social History Narrative   Unemployed   Married   [place] of 2    Has grandchildren out of state   No pets   Sis. died 2009/12/15 COPD      Right handed   Social Determinants of Health   Financial Resource Strain: Low Risk   . Difficulty of Paying Living Expenses: Not hard at all  Food Insecurity: No Food  Insecurity  . Worried About Charity fundraiser in the Last Year: Never true  . Ran Out of Food in the Last Year: Never true  Transportation Needs: No Transportation Needs  . Lack of Transportation (Medical): No  . Lack of Transportation (Non-Medical): No  Physical Activity: Inactive  . Days of Exercise per Week: 0 days  . Minutes of Exercise per Session: 0 min  Stress: No Stress Concern Present  . Feeling of Stress : Not at all  Social Connections: Moderately Integrated  . Frequency of Communication with Friends and Family: Once a week  . Frequency of Social Gatherings with Friends and Family: Twice a week  . Attends Religious Services: More than 4 times per year  . Active Member of Clubs or Organizations: No  . Attends Archivist Meetings: Never  . Marital Status: Married    Outpatient Medications Prior to Visit  Medication Sig Dispense Refill  . AMBULATORY NON FORMULARY MEDICATION Medication Name: nitroglycerin  0.125% gel . Apply a pea size amount to your rectum three times daily x 6-8 weeks. 30 g 1  . calcium carbonate (TUMS EX) 750 MG chewable tablet Chew 1 tablet by mouth as needed for heartburn.    . cholecalciferol (VITAMIN D) 1000 UNITS tablet Take 2,000 Units by mouth daily.    [name] Dexlansoprazole (DEXILANT) 30 MG capsule Take 1 capsule (30 mg total) by mouth daily before breakfast. 30 capsule 11  . dicyclomine (BENTYL) 10 MG capsule TAKE 1 CAPSULE BY MOUTH EVERY 8 HOURS AS NEEDED FOR  SPASMS 90 capsule 0  . escitalopram (LEXAPRO) 10 MG tablet Take 1 tablet by mouth once daily 90 tablet 0  . hydrochlorothiazide (HYDRODIURIL) 12.5 MG tablet Take 1 tablet by mouth once daily 90 tablet 0  . potassium chloride (KLOR-CON) 10 MEQ tablet TAKE 1 TABLET BY MOUTH EVERY OTHER DAY WITH  FLUID  TABLET 45 tablet 0   No facility-administered medications prior to visit.     EXAM:  BP 122/64 (BP Location: Left Arm, Patient Position: Sitting, Cuff Size: Normal)   Pulse 73   Temp 98.4 F (36.9 C) (Oral)   Ht 4\' 10"  (1.473 m)   Wt 145 lb 6.4 oz (66 kg)   SpO2 96%   BMI 30.39 kg/m   Body mass index is 30.39 kg/m.  GENERAL: vitals reviewed and listed above, alert, oriented, appears well hydrated and in no acute distress HEENT: atraumatic, conjunctiva  clear, no obvious abnormalities on inspection of external nose and ears OP : masked NECK: no obvious masses on inspection palpation  LUNGS: clear to auscultation bilaterally, no wheezes, rales or rhonchi, good air movement CV: HRRR, no clubbing cyanosis or  peripheral edema nl cap refill  MS: moves all extremities without noticeable focal  Abnormality mild dhd changes  Skin left  lower extremity anterior large patch pink and cracked skin  Palpable  Excoriated no infection or  Lesion  Right leg less  Dryness  PSYCH: pleasant and cooperative, nl speech attention  Gait  Lab Results  Component Value Date   WBC 7.9 05/07/2019   HGB 13.4 05/07/2019   HCT 40.8  05/07/2019   PLT 276.0 05/07/2019   GLUCOSE 86 05/07/2019   CHOL 164 05/07/2019   TRIG 76.0 05/07/2019   HDL 85.90 05/07/2019   LDLCALC 63 05/07/2019   ALT 16 05/07/2019   AST 16 05/07/2019   NA 137 05/07/2019   K 3.9 05/07/2019   CL 97 05/07/2019   CREATININE  0.84 05/07/2019   BUN 13 05/07/2019   CO2 29 05/07/2019   TSH 2.01 05/07/2019   HGBA1C 5.5 05/07/2019   MICROALBUR 1.6 04/26/2011   BP Readings from Last 3 Encounters:  05/17/20 122/64  03/24/20 137/78  03/10/20 118/82    ASSESSMENT AND PLAN:  Discussed the following assessment and plan:  Essential hypertension - Plan: Basic metabolic panel, CBC with Differential/Platelet, TSH, CBC with Differential/Platelet, Hemoglobin A1c, TSH, CBC with Differential/Platelet, Hemoglobin B5A, Basic metabolic panel  Generalized anxiety disorder - Plan: Basic metabolic panel, CBC with Differential/Platelet, TSH, CBC with Differential/Platelet, Hemoglobin A1c, TSH, CBC with Differential/Platelet, Hemoglobin X0N, Basic metabolic panel  Rash - Plan: Basic metabolic panel, CBC with Differential/Platelet, TSH, CBC with Differential/Platelet, Hemoglobin A1c, TSH, CBC with Differential/Platelet, Hemoglobin M0H, Basic metabolic panel  Medication management - Plan: Basic metabolic panel, CBC with Differential/Platelet, TSH, CBC with Differential/Platelet, Hemoglobin A1c, TSH, CBC with Differential/Platelet, Hemoglobin W8G, Basic metabolic panel  Mild neurocognitive disorder due to multiple etiologies - followed neuro   evauation - Plan: Basic metabolic panel, CBC with Differential/Platelet, TSH, CBC with Differential/Platelet, Hemoglobin A1c, TSH, CBC with Differential/Platelet, Hemoglobin S8P, Basic metabolic panel  Prediabetes - Plan: Basic metabolic panel, CBC with Differential/Platelet, TSH, CBC with Differential/Platelet, Hemoglobin A1c, TSH, CBC with Differential/Platelet, Hemoglobin J0R, Basic metabolic panel Due for lab  monitoring. -Patient advised to return or notify health care team  if  new concerns arise.  Patient Instructions  Lab  Today monitoring.  Work on getting   Sleep apnea treatment.  Use  Cream or water ointment such as aquafor   To itchy patch and then    Cortisone  Topical for itching   And if not better  Then see dermatology.   I f anxiety  Too much we can increase to 15 mg per day .   Plan follow up  Depending  On results .      Standley Brooking. Marquiz Sotelo M.D.

## 2020-05-18 LAB — CBC WITH DIFFERENTIAL/PLATELET
Basophils Absolute: 0.1 10*3/uL (ref 0.0–0.1)
Basophils Relative: 0.8 % (ref 0.0–3.0)
Eosinophils Absolute: 0.3 10*3/uL (ref 0.0–0.7)
Eosinophils Relative: 4 % (ref 0.0–5.0)
HCT: 39.9 % (ref 36.0–46.0)
Hemoglobin: 13.6 g/dL (ref 12.0–15.0)
Lymphocytes Relative: 26.5 % (ref 12.0–46.0)
Lymphs Abs: 2.2 10*3/uL (ref 0.7–4.0)
MCHC: 34.2 g/dL (ref 30.0–36.0)
MCV: 86 fl (ref 78.0–100.0)
Monocytes Absolute: 0.5 10*3/uL (ref 0.1–1.0)
Monocytes Relative: 6.1 % (ref 3.0–12.0)
Neutro Abs: 5.2 10*3/uL (ref 1.4–7.7)
Neutrophils Relative %: 62.6 % (ref 43.0–77.0)
Platelets: 336 10*3/uL (ref 150.0–400.0)
RBC: 4.64 Mil/uL (ref 3.87–5.11)
RDW: 14 % (ref 11.5–15.5)
WBC: 8.3 10*3/uL (ref 4.0–10.5)

## 2020-05-18 LAB — BASIC METABOLIC PANEL
BUN: 13 mg/dL (ref 6–23)
CO2: 28 mEq/L (ref 19–32)
Calcium: 9.3 mg/dL (ref 8.4–10.5)
Chloride: 99 mEq/L (ref 96–112)
Creatinine, Ser: 0.92 mg/dL (ref 0.40–1.20)
GFR: 61.46 mL/min (ref >60.00)
Glucose, Bld: 91 mg/dL (ref 70–99)
Potassium: 3.8 mEq/L (ref 3.5–5.1)
Sodium: 137 mEq/L (ref 135–145)

## 2020-05-18 LAB — HEMOGLOBIN A1C: Hgb A1c MFr Bld: 5.7 % (ref 4.6–6.5)

## 2020-05-18 LAB — TSH: TSH: 1.97 u[IU]/mL (ref 0.35–4.50)

## 2020-05-18 NOTE — Progress Notes (Signed)
Lab results normal no change in plans

## 2020-05-25 ENCOUNTER — Encounter: Payer: Self-pay | Admitting: *Deleted

## 2020-06-15 ENCOUNTER — Other Ambulatory Visit: Payer: Self-pay | Admitting: Gastroenterology

## 2020-06-17 ENCOUNTER — Ambulatory Visit (INDEPENDENT_AMBULATORY_CARE_PROVIDER_SITE_OTHER): Payer: Medicare Other | Admitting: Psychology

## 2020-06-17 ENCOUNTER — Other Ambulatory Visit: Payer: Self-pay

## 2020-06-17 ENCOUNTER — Ambulatory Visit: Payer: Medicare Other | Admitting: Psychology

## 2020-06-17 ENCOUNTER — Encounter: Payer: Self-pay | Admitting: Psychology

## 2020-06-17 DIAGNOSIS — F411 Generalized anxiety disorder: Secondary | ICD-10-CM | POA: Diagnosis not present

## 2020-06-17 DIAGNOSIS — G4733 Obstructive sleep apnea (adult) (pediatric): Secondary | ICD-10-CM | POA: Diagnosis not present

## 2020-06-17 DIAGNOSIS — F332 Major depressive disorder, recurrent severe without psychotic features: Secondary | ICD-10-CM

## 2020-06-17 DIAGNOSIS — R4189 Other symptoms and signs involving cognitive functions and awareness: Secondary | ICD-10-CM

## 2020-06-17 DIAGNOSIS — G3184 Mild cognitive impairment, so stated: Secondary | ICD-10-CM

## 2020-06-17 DIAGNOSIS — M797 Fibromyalgia: Secondary | ICD-10-CM

## 2020-06-17 NOTE — Progress Notes (Signed)
NEUROPSYCHOLOGICAL EVALUATION [facility]  Date of Evaluation: June 17, 2020  Reason for Referral:   Caitlin [name] is a 75 y.o. right-handed Caucasian female referred by [name], M.D., to characterize her current cognitive functioning and assist with diagnostic clarity and treatment planning in the context of a prior diagnosis of mild cognitive impairment, likely due to various psychiatric and medical factors, as well as concerns surrounding continued cognitive decline.   Assessment and Plan:   Clinical Impression(s): Caitlin [name] pattern of performance is suggestive of primary deficits surrounding both attention/concentration and verbal fluency. Performance variability was further noted across processing speed and executive functions. Performance was appropriate across receptive language, confrontation naming, visuospatial abilities, and learning and memory. Caitlin [name] continued to deny significant difficulties completing instrumental activities of daily living (ADLs) independently. As such, given evidence for cognitive dysfunction described above, she continues to best align with characteristics of mild cognitive impairment at the present time.  Relative to her previous evaluation, good stability was exhibited across a majority of assessments. Mild declines were exhibited across attention/concentration, executive functioning, and phonemic fluency. Depressive symptoms across mood-based questionnaires were also noted to have worsened. An improvement was seen across her copy of a complex figure, likely due to improving visual acuity post cataract surgery.  Across mood-related questionnaires, she reported acute levels of severe anxiety and severe depression. She also reported mild sleep dysfunction, likely related to her history of sleep apnea and difficulty finding a good-fitting CPAP mask. Overall, the most likely etiology of reported  dysfunction continues to be a combination of significant psychiatric distress (recently worsened due to complications surrounding IBS), diffuse chronic pain related to her history of fibromyalgia, and chronic sleep dysfunction. The combination of these conditions can certainly impact cognitive functioning, with expected patterns of weakness/variability aligning well with what Caitlin [name] demonstrated across testing. Current patterns of performance and cognitive/behavioral stability are not concerning for an underlying neurodegenerative such as Alzheimer's disease, Lewy body dementia, or frontotemporal dementia at the present time. I do not have updated neuroimaging to further theorize surrounding potential vascular contributions. However, findings in 2016 were minimal in this regard. Continued medical monitoring will be important moving forward.  Recommendations: A combination of medication and psychotherapy has been shown to be most effective at treating symptoms of anxiety and depression. As such, Caitlin [name] is encouraged to speak with her prescribing physician regarding medication adjustments to optimally manage these symptoms.   Likewise, Caitlin [name] is encouraged to consider engaging or re-engaging in short-term psychotherapy to address symptoms of psychiatric distress. She would benefit from an active and collaborative therapeutic environment, rather than one purely supportive in nature. Recommended treatment modalities include Cognitive Behavioral Therapy (CBT) or Acceptance and Commitment Therapy (ACT).  Regarding depressive symptoms, much of this seems to be due to current difficulties regulating symptoms of IBS. The severity of these symptoms has led to her feeling "trapped" in her household and made it difficult for her to imagine a future where she is able to have greater freedom. As such, focusing on treatment of this condition appears paramount in improving her mood and overall functioning.    If  desired, Caitlin [name] could discuss the pros and cons of updated neuroimaging (i.e., brain MRI) with Dr. Delice Lesch given that it has been several years since a previous scan has been performed.   Caitlin [name] is encouraged to attend to lifestyle factors for brain health (e.g., regular physical exercise, good nutrition  habits, regular participation in cognitively-stimulating activities, and general stress management techniques), which are likely to have benefits for both emotional adjustment and cognition. In fact, in addition to promoting good general health, regular exercise incorporating aerobic activities (e.g., brisk walking, jogging, cycling, etc.) has been demonstrated to be a very effective treatment for depression and stress, with similar efficacy rates to both antidepressant medication and psychotherapy. Optimal control of vascular risk factors (including safe cardiovascular exercise and adherence to dietary recommendations) is encouraged.   Continued compliance with her CPAP machine will also be important. Both she and her doctors acknowledge that she deals with mask leaks. However, upon her most recent appointment, said leaks were not influencing AHI. As such, the use of this device is still believed to be medically effective despite these leaks. Continued nightly use is strongly recommended.   When learning new information, she would benefit from information being broken up into small, manageable pieces. She may also find it helpful to articulate the material in her own words and in a context to promote encoding at the onset of a new task. This material may need to be repeated multiple times to promote encoding.  To address problems with fluctuating attention and executive dysfunction, she may wish to consider:   -Avoiding external distractions when needing to concentrate   -Limiting exposure to fast paced environments with multiple sensory demands   -Writing down complicated information and using  checklists   -Attempting and completing one task at a time (i.e., no multi-tasking)    -Verbalizing aloud each step of a task to maintain focus   -Reducing the amount of information considered at one time  Reducing anxiety may also aid in the retrieval of information. When in conversation, Ms. Sanjurjo is encouraged to prepare scripts she can use socially when she experiences difficulty with word finding or memory. Such scripts should be brief explanations of the difficulty (e.g., "the word escapes me now") and allow her to move the conversation forward quickly rather than dwelling on the issue.  Review of Records:   Ms. Kindel was seen by [facility] [name], M.D.) on 03/12/2018 for follow-up of worsening memory. Specifically, Ms. Barreras reported trouble remembering names, as well as word finding difficulties (e.g., must stop mid way through a conversation due to her not being able to remember the word she intends to use). She also has a history of obstructive sleep apnea and is on CPAP/BiPAP currently. However, Ms. Birge reported trouble with her mask. ADLs were generally described as intact. However, she did note occasional instances in which she will get distracted and forget if she previously took her medication. Performance on previous cognitive screening instruments (MMSE) were within normal limits (28/30 in February 2019, 29/30 in February 2018, and 30/30 in February 2017). Likewise, performance during the current visit was also within normal limits (28/30).  She completed a comprehensive neuropsychological evaluation with myself on 10/21/2018. Results suggested a consistent weakness across verbal fluency tasks, as well as notable performance variability across several cognitive domains, including processing speed, executive functioning, and encoding (i.e., learning) aspects of memory. Performance across visuospatial tasks declined with increased complexity, likely due to the presence of  bilateral cataracts and ongoing visual acuity difficulties. Performance across domains of attention/concentration, receptive language, confrontation naming, basic visuospatial abilities, and retrieval and consolidation aspects of memory were consistently within normal limits. Factors which can create and maintain cognitive inefficiencies include significant psychiatric distress (i.e., severe rates of acute anxiety and mild rates of acute  depression), chronic pain due to fibromyalgia, and variable sleep disturbances. The combination of these difficulties can certainly impact cognitive functioning and cause performance variability in the specific domains described above. Overall, it was certainly possible that a combination of these factors were largely responsible for day-to-day cognitive difficulties which Ms. Idleman had been experiencing. Repeat testing in 12-18 months was recommended.   She was again seen by Dr. Delice Lesch on 03/24/2020 for follow-up. She had recently seen her sleep specialist who noted 100% BiPAP compliance. An airleak was acknowledged but this was not said to be affecting her AHI. With Dr. Delice Lesch, Ms. Mclaurin described her memory as about the same, not worse. She denied getting lost driving. She will occasionally forget her medications before remembering them later on. She stated that her husband thinks "I'm on the path to being in a facility, he is very anxious." Her IBS symptoms have not improved and her pain levels reach 8-9 some days. She has alternating diarrhea and constipation. She denied any headaches, dizziness, focal numbness/tingling/weakness, or recent falls. Performance on the MMSE was again 28/30. Ultimately, Ms. Dowen was referred for a repeat neuropsychological evaluation to characterize her cognitive abilities and to assist with diagnostic clarity and treatment planning.  Brain MRI in August 2016 revealed mild diffuse atrophy, similar to a previous MRI in 2012. No more recent  neuroimaging was available for my review.   Past Medical History:  Diagnosis Date  . Allergic rhinitis 07/11/2006  . Back pain 10/09/2007  . Blood transfusion without reported diagnosis   . Diaphragmatic hernia 07/11/2006  . Diverticulosis of colon   . Eczema 07/11/2006  . Elevated cholesterol 04/29/2012  . Enteritis   . Erythema nodosum    Tends to be seasonal has had a negative chest x-ray in the past negative PPD  . Essential hypertension 07/11/2006  . Fatigue 03/12/2007  . Fibromyalgia   . Gastroparesis 12/23/2008  . Generalized anxiety disorder 09/01/2014  . GERD (gastroesophageal reflux disease)    History of stricture and dilatation and Nissen surgery  . Hearing loss, left ear 11/27/2008  . History of cataract    bilateral; s/p surgery  . Hyperglycemia 08/15/2006  . Irritable bowel syndrome 12/23/2008  . Leg cramps, nocturnal 11/11/2007  . 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  . MCI (mild cognitive impairment) 10/22/2018  . Obstructive sleep apnea 04/23/2007   uses CPAP nightly; mask has leak  . Osteoarthritis 11/28/2011  . Osteopenia   . Prediabetes 04/19/2011  . Vitamin B12 deficiency 05/13/2010    Past Surgical History:  Procedure Laterality Date  . ABDOMINAL HYSTERECTOMY    . bone spur    . CATARACT EXTRACTION W/ INTRAOCULAR LENS  IMPLANT, BILATERAL    . CESAREAN SECTION     x 1  . CHOLECYSTECTOMY    . COLONOSCOPY    . DENTAL SURGERY    . ESOPHAGOGASTRODUODENOSCOPY     stricture and dilatation  . left shoulder surgery  06/08, 07/12/17   x 2  . MOUTH SURGERY Right 06/03/2019  . NISSEN FUNDOPLICATION    . ROTATOR CUFF REPAIR Left   . rt knee surgery     scope  . UPPER GASTROINTESTINAL ENDOSCOPY      Current Outpatient Medications:  .  AMBULATORY NON FORMULARY MEDICATION, Medication Name: nitroglycerin 0.125% gel . Apply a pea size amount to your rectum three times daily x 6-8 weeks., Disp: 30 g, Rfl: 1 .  calcium carbonate (TUMS EX) 750 MG chewable  tablet, Chew 1 tablet by mouth  as needed for heartburn., Disp: , Rfl:  .  cholecalciferol (VITAMIN D) 1000 UNITS tablet, Take 2,000 Units by mouth daily., Disp: , Rfl:  .  Dexlansoprazole (DEXILANT) 30 MG capsule, Take 1 capsule (30 mg total) by mouth daily before breakfast., Disp: 30 capsule, Rfl: 11 .  dicyclomine (BENTYL) 10 MG capsule, TAKE 1 CAPSULE BY MOUTH EVERY 8 HOURS AS NEEDED FOR  SPASMS, Disp: 90 capsule, Rfl: 0 .  escitalopram (LEXAPRO) 10 MG tablet, Take 1 tablet (10 mg total) by mouth daily., Disp: 90 tablet, Rfl: 2 .  hydrochlorothiazide (HYDRODIURIL) 12.5 MG tablet, Take 1 tablet (12.5 mg total) by mouth daily., Disp: 90 tablet, Rfl: 2 .  potassium chloride (KLOR-CON) 10 MEQ tablet, TAKE 1 TABLET BY MOUTH EVERY OTHER DAY WITH  FLUID  TABLET, Disp: 45 tablet, Rfl: 2  Clinical Interview:   The following information was obtained during a clinical interview with Ms. Crothers and her husband during her prior evaluation on 10/21/2018. Sections have been updated with current information where appropriate.  Cognitive Symptoms: Decreased short-term memory: Endorsed. Previously, provided examples included difficulties with names and remembering details of previous events. These difficulties were said to have persisted, with Ms. Stepien stating her belief that they have worsened since her 2020 evaluation. She additionally described a recent occurrence where she got confused and drove to the wrong doctor's office, which has increased her overall concerns.  Decreased long-term memory: Denied. Decreased attention/concentration: Endorsed. Ms. Menon previously noted ongoing difficulties with maintaining her focus, increased ease of distractibility, and losing her train of thought. These were said to have persisted and perhaps gotten mildly worse.  Reduced processing speed: Endorsed. Previously, difficulties were largely attributed to symptoms of "brain fog" stemming from her history of fibromyalgia. This  was unchanged.  Difficulties with executive functions: Endorsed. Previously, difficulties with organization were acknowledged, attributed to instances where she gets distracted more easily. She also noted trouble with indecisiveness in which she feels less confident in her decision making. Trouble with impulsivity or poor judgment was denied. This was stable relative to the current evaluation. Difficulties with emotion regulation: Denied. Difficulties with receptive language: Denied. Difficulties with word finding: Endorsed. In addition to trouble remembering the names of individuals, she also reported frequently experiencing a tip-of-the-tongue phenomenon. This has persisted to present day.  Decreased visuoperceptual ability: Denied. Previously, trouble with depth perception was endorsed and had contributed to a 2019 fall where she tripped over a curb while trying to enter a restaurant. These difficulties have improved since undergoing cataract surgery.   Trajectory of deficits: Cognitive dysfunction, especially that surrounding memory, was said to have been present for the past 4-5 years. As described above, Ms. Mustapha did express some concern surrounding these abilities worsening since her previous evaluation in 2020.  Difficulties completing ADLs: Largely denied. Basic ADLs continue to be intact. Ms. Reznick noted occasionally forgetting to take her medications despite the use of a pillbox. Her husband manages their personal finances. Difficulties with driving were denied outside of her recent and isolated occurrence of driving to the wrong doctor's office for an appointment. Outside of the latter example, this was unchanged.   Additional Medical History: History of traumatic brain injury/concussion: Denied. History of stroke: Denied. History of seizure activity: Denied. History of known exposure to toxins: Denied. Symptoms of chronic pain: Endorsed. Ms. Venters previously reported a history of  fibromyalgia. She described her baseline level of pain as an 8/10 in the morning after first awakening. These levels were said  to diminish as the day progresses, but always remain to an extent to where they are functionally limiting. She denied current medication intervention for pain-related symptoms. Currently, this description was said to remain accurate.  Experience of frequent headaches/migraines: Endorsed. She previously reported commonly waking up with a mild headache. However, symptoms are managed appropriately via OTC medications. This has remained unchanged.  Frequent instances of dizziness/vertigo: Denied.  Sensory changes: Ms. Karapetian underwent cataract surgery in her left eye in October 2020. Since that time, visual acuity was said to have improved. She continues to wear corrective lenses, especially while driving. Outside of vision, she reported her perception that she has experienced mild hearing loss. Her most recent hearing evaluation was said to have occurred several years prior. No other sensory changes/difficulties (e.g., taste or smell) were reported.  Balance/coordination difficulties: Endorsed. Balance concerns were described as mild overall but potentially worse relative to her prior evaluation. She denied any recent falls with her last one occurring in 2019 which led to her needing to have shoulder surgery. Other motor difficulties: Denied.  Sleep History: Estimated hours obtained each night: 4-10 hours depending on experienced difficulties falling asleep. Difficulties falling asleep: Endorsed. These were said to occur sporadically and were at least partially attributed to her husband's snoring.  Difficulties staying asleep: Denied with the assistance of medications and her CPAP machine.  Feels rested and refreshed upon awakening: Denied. She reported waking feeling "rotten" most nights.   History of snoring: Endorsed. History of waking up gasping for air:  Endorsed. Witnessed breath cessation while asleep: Endorsed. Ms. Balthazar acknowledged a history of obstructive sleep apnea. She currently uses her CPAP machine regularly, but endorsed difficulties with her mask. Specifically, she noted not being able to achieve a solid fit, stating that she is likely in-between sizes (e.g., too large for the smaller size but too small for a larger size).  History of vivid dreaming: Denied. Excessive movement while asleep: Denied. Instances of acting out her dreams: Denied.  Psychiatric/Behavioral Health History: Depression: Endorsed. Ms. Zentz described a longstanding history of depression, dating back at least 10 years. She described her current mood as "terrible." This was largely attributed to her various medical ailments, especially recent exacerbations of IBS. She described it being challenging for her to think about having a future due to her being "trapped" in her home for fear of needing to quickly find a restroom. She reported utilizing mood-related medications with positive effect. Current or remote suicidal ideation, intent, or plan was denied. Anxiety: Endorsed. Coinciding with IBS symptoms influencing depressive symptoms, they also fuel anxiety symptoms surrounding her necessity to find a bathroom quickly should the need arise. Her husband noted that times where she is able to get out and perform some enjoyable activity, her mood is noticeably improved. However, these opportunities are few and far between at the present time.  Mania: Denied. Trauma History: Denied. Visual/auditory hallucinations: Denied. Delusional thoughts: Denied.  Tobacco: Denied. Alcohol: Ms. Kosa denied current alcohol consumption as well as a history of problematic alcohol abuse or dependence.   Recreational drugs: Denied. Caffeine: She reported consuming an occasional caffeinated soda during the day.  Family History: Problem Relation Age of Onset  . Depression Mother   .  Kidney cancer Mother   . Dementia Mother        Unspecified, likely secondary to other medical condition (cancer)  . Depression Father   . Hiatal hernia Father   . COPD Sister   . Dementia Sister  Unspecified, likely secondary to other medical condition  . Nephrolithiasis Other   . Colon cancer Neg Hx   . Esophageal cancer Neg Hx   . Pancreatic cancer Neg Hx   . Rectal cancer Neg Hx   . Stomach cancer Neg Hx    This information was confirmed by Ms. Khawaja.  Academic/Vocational History: Highest level of educational attainment: 15 years. Ms. Kress graduated from high school and completed an additional 3 years at [employer] college working towards an Biomedical scientist. She described herself as a strong (mostly A) student in academic settings. 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 History of developmental delay: Denied. History of grade repetition: Denied. Enrollment in special education courses: Endorsed. She reported being enrolled in speech courses in early academic settings due to being born tongue-tied and having her tongue clipping procedure shortly before starting school. History of LD/ADHD: Denied.  Employment: Retired. 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  Evaluation Results:   Behavioral Observations: Ms. Estupinan was accompanied by her husband, arrived to her appointment on time, and was appropriately dressed and groomed. She appeared alert and oriented. Observed gait and station were within normal limits. Gross motor functioning appeared intact upon informal observation and no abnormal movements (e.g., tremors) were noted. Her affect was generally relaxed and positive, but did range appropriately given the subject being discussed during interview. Spontaneous speech was fluent and word finding difficulties were not observed during the clinical interview. Thought processes were  coherent, organized, and normal in content. Insight into her cognitive difficulties appeared adequate.   During testing, her affect was noted to be flat and she did not display much emotion. Sustained attention was appropriate. Task engagement was adequate and she persisted when challenged. She had mild difficulty understanding more complex task instructions. Overall, Ms. Giammarco was cooperative with the clinical interview and subsequent testing procedures.  Adequacy of Effort: The validity of neuropsychological testing is limited by the extent to which the individual being tested may be assumed to have exerted adequate effort during testing. Ms. Mena expressed her intention to perform to the best of her abilities and exhibited adequate task engagement and persistence. Scores across stand-alone and embedded performance validity measures were within expectation. As such, the results of the current evaluation are believed to be a valid representation of Ms. Duerson's current cognitive functioning.  Test Results: Ms. Durney was fully oriented at the time of the current evaluation.  Intellectual abilities based upon educational and vocational attainment were estimated to be in the below average to average range. Premorbid abilities were estimated to be within the upper limits of the below average range based upon a single-word reading test administered during the previous evaluation.   Processing speed was variable, ranging from the well below average to average normative ranges. Basic attention was well below average. More complex attention (e.g., working memory) was also well below average. Executive functioning was mildly variable, ranging from the exceptionally low to below average normative ranges.  Assessed receptive language abilities were average. Likewise, Ms. Donn did not exhibit any difficulties comprehending task instructions and answered all questions asked of her appropriately. Assessed  expressive language were variable. Phonemic fluency was exceptionally low, semantic fluency was well below average, and confrontation naming was above average.     Assessed visuospatial/visuoconstructional abilities were below average to average.    Learning (i.e., encoding) of novel verbal and visual information was below average to average. Spontaneous  delayed recall (i.e., retrieval) of previously learned information was also below average to average. Retention rates were 74% across a story learning task, 57% (spontaneous) to 100% (cued) across a list learning task, and 86% across a shape learning task. Performance across recognition tasks was below average to average, suggesting evidence for information consolidation.   Results of emotional screening instruments suggested that recent symptoms of generalized anxiety were in the severe range, while symptoms of depression were also within the severe range. A screening instrument assessing recent sleep quality suggested the presence of mild sleep dysfunction.  Tables of Scores:   Note: This summary of test scores accompanies the interpretive report and should not be considered in isolation without reference to the appropriate sections in the text. Descriptors are based on appropriate normative data and may be adjusted based on clinical judgment. The terms "impaired" and "within normal limits (WNL)" are used when a more specific level of functioning cannot be determined. Descriptors refer to the current evaluation only.         Validity Testing:    [facility]   October 2020 Current    Dot Counting Test: --- --- --- Within Expectation  CVLT-III Forced Choice Recognition: --- --- --- Within Expectation  BVMT-R Retention Percentage: --- --- --- Within Expectation  D-KEFS Color Word Effort Index: --- --- --- Within Expectation        Orientation:       Raw Score Raw Score Percentile   NAB Orientation, Form 1 29/29 29/29 --- ---        Cognitive  Screening:             Raw Score Raw Score Percentile   SLUMS: --- 22/30 --- ---        Intellectual Functioning:             Standard Score Standard Score Percentile   Test of Premorbid Functioning: 89 --- --- ---        Memory:            Wechsler Memory Scale (WMS-IV):                       Raw Score Raw Score (Scaled Score) Percentile     Logical Memory I 25/53 28/53 (9) 37 Average    Logical Memory II 15/39 14/39 (9) 37 Average    Logical Memory Recognition 20/23 17/23 26-50 Average        [place] Verbal Learning Test (CVLT-III) Brief Form: Raw Score Raw Score (Scaled/Standard Score) Percentile     Total Trials 1-4 19/36 21/36 (84) 14 Below Average    Short-Delay Free Recall 5/9 6/9 (8) 25 Average    Long-Delay Free Recall 5/9 4/9 (6) 9 Below Average    Long-Delay Cued Recall 5/9 7/9 (11) 63 Average      Recognition Hits 9/9 9/9 (13) 84 Above Average      False Positive Errors 0 1 (9) 37 Average        Brief Visuospatial Memory Test (BVMT-R), Form 1: Raw Score Raw Score (T Score) Percentile     Total Trials 1-3 15/36 13/36 (38) 12 Below Average    Delayed Recall 8/12 6/12 (43) 25 Average    Recognition Discrimination Index 5 5 >16 Within Normal Limits      Recognition Hits 6/6 6/6 >16 Within Normal Limits      False Positive Errors 1 1 11-16 Below Average         Attention/Executive  Function:            Trail Making Test (TMT): Raw Score Raw Score (T Score) Percentile     Part A 62 secs.,  0 errors 59 secs.,  1 error (35) 7 Well Below Average    Part B 169 secs.,  0 errors 207 secs.,  2 errors (33) 5 Well Below Average          Scaled Score Scaled Score Percentile   WAIS-IV Coding: 9 9 37 Average        NAB Attention Module, Form 1: T Score T Score Percentile     Digits Forward 41 35 7 Well Below Average    Digits Backwards 37 30 2 Well Below Average        D-KEFS Color-Word Interference Test: Raw Score Raw Score (Scaled Score) Percentile     Color  Naming 39 secs. 47 secs. (5) 5 Well Below Average    Word Reading 27 secs. 31 secs. (7) 16 Below Average    Inhibition 99 secs. 119 secs. (3) 1 Exceptionally Low      Total Errors 4 errors 4 errors (9) 37 Average    Inhibition/Switching 100 secs. 123 secs. (4) 2 Well Below Average      Total Errors 6 errors 11 errors (2) <1 Exceptionally Low        Language:            Verbal Fluency Test: Raw Score Raw Score (T Score) Percentile     Phonemic Fluency (FAS) 25 17 (27) 1 Exceptionally Low    Animal Fluency 8 12 (35) 7 Well Below Average         NAB Language Module, Form 1: T Score T Score Percentile     Auditory Comprehension --- 55 69 Average    Naming 59 31/31 (59) 82 Above Average        Visuospatial/Visuoconstruction:       Raw Score Raw Score Percentile   Clock Drawing: 9/10 9/10 --- Within Normal Limits        NAB Spatial Module, Form 1: T Score T Score Percentile     Visual Discrimination --- 41 18 Below Average    Figure Drawing Copy 29 46 34 Average         Scaled Score Scaled Score Percentile   WAIS-IV Matrix Reasoning: 11 7 16  Below Average        Mood and Personality:       Raw Score Raw Score Percentile   Geriatric Depression Scale: 17 26 --- Severe  Geriatric Anxiety Scale: 43 49 --- Severe    Somatic 15 15 --- Severe    Cognitive 11 14 --- Severe    Affective 17 20 --- Severe        Additional Questionnaires:       Raw Score Raw Score Percentile   PROMIS Sleep Disturbance Questionnaire: 24 27 --- Mild   Informed Consent and Coding/Compliance:   The current evaluation represents a clinical evaluation for the purposes previously outlined by the referral source and is in no way reflective of a forensic evaluation.   Ms. Gaugh was provided with a verbal description of the nature and purpose of the present neuropsychological evaluation. Also reviewed were the foreseeable risks and/or discomforts and benefits of the procedure, limits of confidentiality, and  mandatory reporting requirements of this provider. The patient was given the opportunity to ask questions and receive answers about the evaluation. Oral consent to participate was provided  by the patient.   This evaluation was conducted by Christia Reading, Ph.D., licensed clinical neuropsychologist. Ms. Whidden completed a clinical interview with Dr. Melvyn Novas. However, given its brief and targeted nature, no billing was generated from this portion of the evaluation. She completed 120 minutes of cognitive testing and scoring, billed as one unit [phone] and three additional units [id number]. Psychometrist Milana Kidney, B.S., assisted Dr. Melvyn Novas with test administration and scoring procedures. As a separate and discrete service, Dr. Melvyn Novas spent a total of 155 minutes in interpretation and report writing billed as one unit [phone] and two units [id number].

## 2020-06-17 NOTE — Progress Notes (Signed)
   Psychometrician Note   Cognitive testing was administered to Caitlin [name] by Caitlin [name], B.S. (psychometrist) under the supervision of Caitlin [name], Ph.D., licensed psychologist on 06/17/20. Caitlin [name] did not appear overtly distressed by the testing session per behavioral observation or responses across self-report questionnaires. Rest breaks were offered.    The battery of tests administered was selected by Caitlin [name], Ph.D. with consideration to Caitlin [name]'s current level of functioning, the nature of her symptoms, emotional and behavioral responses during interview, level of literacy, observed level of motivation/effort, and the nature of the referral question. This battery was communicated to the psychometrist. Communication between Caitlin [name], Ph.D. and the psychometrist was ongoing throughout the evaluation and Caitlin [name], Ph.D. was immediately accessible at all times. Caitlin [name], Ph.D. provided supervision to the psychometrist on the date of this service to the extent necessary to assure the quality of all services provided.    Caitlin [name] will return within approximately 1-2 weeks for an interactive feedback session with Caitlin [name] at which time her test performances, clinical impressions, and treatment recommendations will be reviewed in detail. Caitlin [name] understands she can contact our office should she require our assistance before this time.  A total of 120 minutes of billable time were spent face-to-face with Caitlin [name] by the psychometrist. This includes both test administration and scoring time. Billing for these services is reflected in the clinical report generated by Caitlin [name], Ph.D.  This note reflects time spent with the psychometrician and does not include test scores or any clinical interpretations made by Caitlin [name]. The full report will follow in a separate note.

## 2020-06-24 ENCOUNTER — Encounter: Payer: Medicare Other | Admitting: Psychology

## 2020-06-30 ENCOUNTER — Other Ambulatory Visit: Payer: Self-pay

## 2020-06-30 ENCOUNTER — Ambulatory Visit (INDEPENDENT_AMBULATORY_CARE_PROVIDER_SITE_OTHER): Payer: Medicare Other | Admitting: Psychology

## 2020-06-30 DIAGNOSIS — F411 Generalized anxiety disorder: Secondary | ICD-10-CM | POA: Diagnosis not present

## 2020-06-30 DIAGNOSIS — G3184 Mild cognitive impairment, so stated: Secondary | ICD-10-CM | POA: Diagnosis not present

## 2020-06-30 DIAGNOSIS — M797 Fibromyalgia: Secondary | ICD-10-CM | POA: Diagnosis not present

## 2020-06-30 DIAGNOSIS — F332 Major depressive disorder, recurrent severe without psychotic features: Secondary | ICD-10-CM

## 2020-06-30 DIAGNOSIS — G4733 Obstructive sleep apnea (adult) (pediatric): Secondary | ICD-10-CM

## 2020-06-30 NOTE — Progress Notes (Signed)
   Neuropsychology Feedback Session Tillie Rung. [place] Department of Neurology  Reason for Referral:   Caitlin [name] is a 75 y.o. right-handed Caucasian female referred by [name], M.D., to characterize her current cognitive functioning and assist with diagnostic clarity and treatment planning in the context of a prior diagnosis of mild cognitive impairment, likely due to various psychiatric and medical factors, as well as concerns surrounding continued cognitive decline.  Feedback:   Caitlin [name] completed a comprehensive neuropsychological evaluation on 06/17/2020. Please refer to that encounter for the full report and recommendations. Briefly, results suggested primary deficits surrounding both attention/concentration and verbal fluency. Performance variability was further noted across processing speed and executive functions. Relative to her previous evaluation, good stability was exhibited across a majority of assessments. Mild declines were exhibited across attention/concentration, executive functioning, and phonemic fluency. Depressive symptoms across mood-based questionnaires were also noted to have worsened. An improvement was seen across her copy of a complex figure, likely due to improving visual acuity post cataract surgery. Across mood-related questionnaires, she reported acute levels of severe anxiety and severe depression. She also reported mild sleep dysfunction, likely related to her history of sleep apnea and difficulty finding a good-fitting CPAP mask. Overall, the most likely etiology of reported dysfunction continues to be a combination of significant psychiatric distress (recently worsened due to complications surrounding IBS), diffuse chronic pain related to her history of fibromyalgia, and chronic sleep dysfunction.   Caitlin [name] was accompanied by her husband during the current telephone call. They were within their residence while I was within my office. I  discussed the limitations of evaluation and management by telemedicine and the availability of in person appointments. Caitlin [name] expressed her understanding and agreed to proceed. Content of the current session focused on the results of her neuropsychological evaluation. Caitlin [name] and her husband were given the opportunity to ask questions and their questions were answered. They were encouraged to reach out should additional questions arise. A copy of her report was mailed at the conclusion of the visit.      17 minutes were spent conducting the current feedback session with Caitlin [name], billed as one unit [phone].

## 2020-07-30 LAB — HM DIABETES EYE EXAM

## 2020-08-10 ENCOUNTER — Other Ambulatory Visit: Payer: Self-pay | Admitting: Gastroenterology

## 2020-08-18 ENCOUNTER — Ambulatory Visit (INDEPENDENT_AMBULATORY_CARE_PROVIDER_SITE_OTHER): Payer: Medicare Other

## 2020-08-18 ENCOUNTER — Other Ambulatory Visit: Payer: Self-pay

## 2020-08-18 VITALS — BP 122/70 | HR 70 | Temp 98.1°F | Ht 59.0 in | Wt 148.0 lb

## 2020-08-18 DIAGNOSIS — Z0000 Encounter for general adult medical examination without abnormal findings: Secondary | ICD-10-CM

## 2020-08-18 NOTE — Progress Notes (Signed)
Subjective:   Caitlin [name] is a 75 y.o. female who presents for Medicare Annual (Subsequent) preventive examination.  Review of Systems    N/a       Objective:    There were no vitals filed for this visit. There is no height or weight on file to calculate BMI.  Advanced Directives 03/24/2020 08/28/2019 03/10/2019 12/17/2017 10/23/2017 10/19/2016 04/28/2016  Does Patient Have a Medical Advance Directive? Yes Yes Yes Yes Yes Yes Yes  Type of Paramedic of [place];Living will [place];Living will [place];Living will  Does patient want to make changes to medical advance directive? - No - Patient declined - No - Patient declined - - -  Copy of [place] in Chart? - Yes - validated most recent copy scanned in chart (See row information) - No - copy requested - - -    Current Medications (verified) Outpatient Encounter Medications as of 08/18/2020  Medication Sig   AMBULATORY NON FORMULARY MEDICATION Medication Name: nitroglycerin 0.125% gel . Apply a pea size amount to your rectum three times daily x 6-8 weeks.   calcium carbonate (TUMS EX) 750 MG chewable tablet Chew 1 tablet by mouth as needed for heartburn.   cholecalciferol (VITAMIN D) 1000 UNITS tablet Take 2,000 Units by mouth daily.   Dexlansoprazole (DEXILANT) 30 MG capsule Take 1 capsule (30 mg total) by mouth daily before breakfast.   dicyclomine (BENTYL) 10 MG capsule TAKE 1 CAPSULE BY MOUTH EVERY 8 HOURS AS NEEDED FOR  SPASM   escitalopram (LEXAPRO) 10 MG tablet Take 1 tablet (10 mg total) by mouth daily.   hydrochlorothiazide (HYDRODIURIL) 12.5 MG tablet Take 1 tablet (12.5 mg total) by mouth daily.   potassium chloride (KLOR-CON) 10 MEQ tablet TAKE 1 TABLET BY MOUTH EVERY OTHER DAY WITH  FLUID  TABLET   No facility-administered encounter medications on file as of 08/18/2020.     Allergies (verified) Mobic [meloxicam], Aleve [naproxen], Codeine phosphate, Colestipol hcl, Omeprazole-sodium bicarbonate, Other, Protonix [pantoprazole sodium], Sodium, and Sulfamethoxazole   History: Past Medical History:  Diagnosis Date   Allergic rhinitis 07/11/2006   Back pain 10/09/2007   Blood transfusion without reported diagnosis    Diaphragmatic hernia 07/11/2006   Diverticulosis of colon    Eczema 07/11/2006   Elevated cholesterol 04/29/2012   Enteritis    Erythema nodosum    Tends to be seasonal has had a negative chest x-ray in the past negative PPD   Essential hypertension 07/11/2006   Fatigue 03/12/2007   Fibromyalgia    Gastroparesis 12/23/2008   Generalized anxiety disorder 09/01/2014   GERD (gastroesophageal reflux disease)    History of stricture and dilatation and Nissen surgery   Hearing loss, left ear 11/27/2008   History of cataract    bilateral; s/p surgery   Hyperglycemia 08/15/2006   Irritable bowel syndrome 12/23/2008   Leg cramps, nocturnal 11/11/2007   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   MCI (mild cognitive impairment) 10/22/2018   Obstructive sleep apnea 04/23/2007   uses CPAP nightly; mask has leak   Osteoarthritis 11/28/2011   Osteopenia    Prediabetes 04/19/2011   Vitamin B12 deficiency 05/13/2010   Past Surgical History:  Procedure Laterality Date   ABDOMINAL HYSTERECTOMY     bone spur     CATARACT EXTRACTION W/ INTRAOCULAR LENS  IMPLANT, BILATERAL     CESAREAN SECTION     x 1  CHOLECYSTECTOMY     COLONOSCOPY     DENTAL SURGERY     ESOPHAGOGASTRODUODENOSCOPY     stricture and dilatation   left shoulder surgery  06/08, 07/12/17   x 2   MOUTH SURGERY Right [id number]   NISSEN FUNDOPLICATION     ROTATOR CUFF REPAIR Left    rt knee surgery     scope   UPPER GASTROINTESTINAL ENDOSCOPY     Family History  Problem Relation Age of Onset   Depression Mother    Kidney cancer Mother    Dementia Mother        Unspecified,  likely secondary to other medical condition (cancer)   Depression Father    Hiatal hernia Father    COPD Sister    Dementia Sister        Unspecified, likely secondary to other medical condition   Nephrolithiasis Other    Colon cancer Neg Hx    Esophageal cancer Neg Hx    Pancreatic cancer Neg Hx    Rectal cancer Neg Hx    Stomach cancer Neg Hx    Social History   Socioeconomic History   Marital status: Married    Spouse name: Not on file   Number of children: 1   Years of education: 15   Highest education level: Some college, no degree  Occupational History   Occupation: Retired  Tobacco Use   Smoking status: Never   Smokeless tobacco: Never  Scientific laboratory technician Use: Never used  Substance and Sexual Activity   Alcohol use: No    Alcohol/week: 0.0 standard drinks   Drug use: No   Sexual activity: Not on file  Other Topics Concern   Not on file  Social History Narrative   Unemployed   Married   [place] of 2    Has grandchildren out of state   No pets   Sis. died 31-Dec-2009 COPD      Right handed   Social Determinants of Health   Financial Resource Strain: Low Risk    Difficulty of Paying Living Expenses: Not hard at all  Food Insecurity: No Food Insecurity   Worried About Charity fundraiser in the Last Year: Never true   Arboriculturist in the Last Year: Never true  Transportation Needs: No Transportation Needs   Lack of Transportation (Medical): No   Lack of Transportation (Non-Medical): No  Physical Activity: Inactive   Days of Exercise per Week: 0 days   Minutes of Exercise per Session: 0 min  Stress: No Stress Concern Present   Feeling of Stress : Not at all  Social Connections: Moderately Integrated   Frequency of Communication with Friends and Family: Once a week   Frequency of Social Gatherings with Friends and Family: Twice a week   Attends Religious Services: More than 4 times per year   Active Member of [employer] or Organizations: No   Attends English as a second language teacher Meetings: Never   Marital Status: Married    Tobacco Counseling Counseling given: Not Answered   Clinical Intake:                 Diabetic?no         Activities of Daily Living In your present state of health, do you have any difficulty performing the following activities: 08/28/2019  Hearing? Y  Comment States that she has some issues hearing at times. Does not hear well in noisy environments  Vision? N  Difficulty concentrating  or making decisions? Y  Comment Has issues with names  Walking or climbing stairs? N  Dressing or bathing? N  Doing errands, shopping? N  Preparing Food and eating ? N  Using the Toilet? N  Do you have problems with loss of bowel control? N  Managing your Medications? N  Managing your Finances? N  Housekeeping or managing your Housekeeping? N  Some recent data might be hidden    Patient Care Team: Panosh, Standley Brooking, MD as PCP - General Ernesto Rutherford Maudry Mayhew, MD [place] Dragon, MD (Inactive) (Gastroenterology) Jarome Matin, MD as Consulting Physician (Dermatology) Monna Fam, MD as Attending Physician (Ophthalmology) Elsie Saas, MD as Attending Physician (Orthopedic Surgery) Sydnee Levans, MD as Consulting Physician (Dermatology) Cameron Sprang, MD as Consulting Physician (Neurology)  Indicate any recent Medical Services you may have received from other than [facility] providers in the past year (date may be approximate).     Assessment:   This is a routine wellness examination for [place].  Hearing/Vision screen No results found.  Dietary issues and exercise activities discussed:     Goals Addressed   None    Depression Screen PHQ 2/9 Scores 08/28/2019 10/23/2017 04/17/2017 10/19/2016 08/30/2015 05/05/2012 04/29/2012  PHQ - 2 Score '1 6 6 '$ 0 2 1 0  PHQ- 9 Score 7 21 - - - - -    Fall Risk Fall Risk  03/24/2020 08/28/2019 03/10/2019 03/12/2018 10/23/2017  Falls in the past year? 0 0 0 1 Yes  Number falls in past  yr: 0 0 0 1 1  Injury with Fall? 0 0 0 1 Yes  Risk for fall due to : - History of fall(s) - - -  Follow up - Falls evaluation completed;Falls prevention discussed - - -    FALL RISK PREVENTION PERTAINING TO THE HOME:  Any stairs in or around the home? No  If so, are there any without handrails? No  Home free of loose throw rugs in walkways, pet beds, electrical cords, etc? Yes  Adequate lighting in your home to reduce risk of falls? Yes   ASSISTIVE DEVICES UTILIZED TO PREVENT FALLS:  Life alert? No  Use of a cane, walker or w/c? No  Grab bars in the bathroom? No  Shower chair or bench in shower? No  Elevated toilet seat or a handicapped toilet? No   TIMED UP AND GO:  Was the test performed? Yes .  Length of time to ambulate 10 feet: 6 sec.   Gait steady and fast without use of assistive device  Cognitive Function: Normal cognitive status assessed by direct observation by this Nurse Health Advisor. No abnormalities found.   MMSE - Mini Mental State Exam 04/04/2020 03/12/2018 10/23/2017 03/12/2017 10/19/2016  Not completed: - - (No Data) - (No Data)  Orientation to time 5 5 - 5 -  Orientation to Place 5 5 - 5 -  Registration 3 3 - 3 -  Attention/ Calculation 3 3 - 3 -  Recall 3 3 - 3 -  Language- name 2 objects 2 2 - 2 -  Language- repeat 1 1 - 1 -  Language- follow 3 step command 3 3 - 3 -  Language- read & follow direction 1 1 - 1 -  Write a sentence 1 1 - 1 -  Copy design 1 1 - 1 -  Total score 28 28 - 28 -     6CIT Screen 08/28/2019  What Year? 0 points  What month? 0 points  What time? 0 points  Count back from 20 0 points  Months in reverse 0 points  Repeat phrase 0 points  Total Score 0    Immunizations Immunization History  Administered Date(s) Administered   H1N1 03/09/2008   Influenza Split 11/08/2010, 10/17/2011   Influenza Whole 11/14/2006, 10/09/2007, 10/21/2008, 10/19/2009   Influenza, High Dose Seasonal PF 10/06/2014, 10/01/2015, 10/05/2016,  10/12/2017, 10/03/2018   Influenza,inj,Quad PF,6+ Mos 10/11/2012, 10/14/2013   Influenza-Unspecified 10/01/2018   PFIZER(Purple Top)SARS-COV-2 Vaccination 03/01/2019, 03/24/2019, 06/21/2020   PPD Test 06/05/2012   Pneumococcal Conjugate-13 11/05/2012   Pneumococcal Polysaccharide-23 03/09/2008, 10/06/2014   Tdap 10/27/2011   Zoster Recombinat (Shingrix) 10/17/2017, 01/02/2018   Zoster, Live 08/08/2010    TDAP status: Up to date  Flu Vaccine status: Up to date  Pneumococcal vaccine status: Up to date  Covid-19 vaccine status: Completed vaccines  Qualifies for Shingles Vaccine? Yes   Zostavax completed No   Shingrix Completed?: No.    Education has been provided regarding the importance of this vaccine. Patient has been advised to call insurance company to determine out of pocket expense if they have not yet received this vaccine. Advised may also receive vaccine at local pharmacy or Health Dept. Verbalized acceptance and understanding.  Screening Tests Health Maintenance  Topic Date Due   INFLUENZA VACCINE  08/16/2020   COVID-19 Vaccine (4 - Booster for Pfizer series) 09/21/2020   MAMMOGRAM  10/22/2021   TETANUS/TDAP  10/26/2021   COLONOSCOPY (Pts 45-39yr Insurance coverage will need to be confirmed)  10/05/2026   DEXA SCAN  Completed   Hepatitis C Screening  Completed   PNA vac Low Risk Adult  Completed   Zoster Vaccines- Shingrix  Completed   HPV VACCINES  Aged Out    Health Maintenance  Health Maintenance Due  Topic Date Due   INFLUENZA VACCINE  08/16/2020    Colorectal cancer screening: Type of screening: Colonoscopy. Completed 10/04/2016. Repeat every 10 years  Mammogram status: Completed 10/23/2019. Repeat every year  Bone Density status: Completed 12/27/2017. Results reflect: Bone density results: OSTEOPENIA. Repeat every 5 years.  Lung Cancer Screening: (Low Dose CT Chest recommended if Age 74-80years, 30 pack-year currently smoking OR have quit w/in  15years.) does not qualify.   Lung Cancer Screening Referral: n/a  Additional Screening:  Hepatitis C Screening: does not qualify; Completed 11/05/2007  Vision Screening: Recommended annual ophthalmology exams for early detection of glaucoma and other disorders of the eye. Is the patient up to date with their annual eye exam?  Yes  Who is the provider or what is the name of the office in which the patient attends annual eye exams? Dr.Hecker  If pt is not established with a provider, would they like to be referred to a provider to establish care? No .   Dental Screening: Recommended annual dental exams for proper oral hygiene  Community Resource Referral / Chronic Care Management: CRR required this visit?  No   CCM required this visit?  No      Plan:     I have personally reviewed and noted the following in the patient's chart:   Medical and social history Use of alcohol, tobacco or illicit drugs  Current medications and supplements including opioid prescriptions.  Functional ability and status Nutritional status Physical activity Advanced directives List of other physicians Hospitalizations, surgeries, and ER visits in previous 12 months Vitals Screenings to include cognitive, depression, and falls Referrals and appointments  In addition, I have reviewed and discussed with patient  certain preventive protocols, quality metrics, and best practice recommendations. A written personalized care plan for preventive services as well as general preventive health recommendations were provided to patient.     Randel Pigg, LPN   [phone]   Nurse Notes: none

## 2020-08-18 NOTE — Patient Instructions (Signed)
Caitlin [name] , Thank you for taking time to come for your Medicare Wellness Visit. I appreciate your ongoing commitment to your health goals. Please review the following plan we discussed and let me know if I can assist you in the future.   Screening recommendations/referrals: Colonoscopy: 10/04/2016 Mammogram: 10/23/2019 Bone Density: 12/27/2017 Recommended yearly ophthalmology/optometry visit for glaucoma screening and checkup Recommended yearly dental visit for hygiene and checkup  Vaccinations: Influenza vaccine: due in fall 2022 Pneumococcal vaccine: completed series  Tdap vaccine: 01/02/2018 Shingles vaccine: completed series     Advanced directives: will provide copies   Conditions/risks identified: none   Next appointment: none    Preventive Care 74 Years and Older, Female Preventive care refers to lifestyle choices and visits with your health care provider that can promote health and wellness. What does preventive care include? A yearly physical exam. This is also called an annual well check. Dental exams once or twice a year. Routine eye exams. Ask your health care provider how often you should have your eyes checked. Personal lifestyle choices, including: Daily care of your teeth and gums. Regular physical activity. Eating a healthy diet. Avoiding tobacco and drug use. Limiting alcohol use. Practicing safe sex. Taking low-dose aspirin every day. Taking vitamin and mineral supplements as recommended by your health care provider. What happens during an annual well check? The services and screenings done by your health care provider during your annual well check will depend on your age, overall health, lifestyle risk factors, and family history of disease. Counseling  Your health care provider may ask you questions about your: Alcohol use. Tobacco use. Drug use. Emotional well-being. Home and relationship well-being. Sexual activity. Eating habits. History of  falls. Memory and ability to understand (cognition). Work and work Statistician. Reproductive health. Screening  You may have the following tests or measurements: Height, weight, and BMI. Blood pressure. Lipid and cholesterol levels. These may be checked every 5 years, or more frequently if you are over 74 years old. Skin check. Lung cancer screening. You may have this screening every year starting at age 74 if you have a 30-pack-year history of smoking and currently smoke or have quit within the past 15 years. Fecal occult blood test (FOBT) of the stool. You may have this test every year starting at age 74. Flexible sigmoidoscopy or colonoscopy. You may have a sigmoidoscopy every 5 years or a colonoscopy every 10 years starting at age 74. Hepatitis C blood test. Hepatitis B blood test. Sexually transmitted disease (STD) testing. Diabetes screening. This is done by checking your blood sugar (glucose) after you have not eaten for a while (fasting). You may have this done every 1-3 years. Bone density scan. This is done to screen for osteoporosis. You may have this done starting at age 74. Mammogram. This may be done every 1-2 years. Talk to your health care provider about how often you should have regular mammograms. Talk with your health care provider about your test results, treatment options, and if necessary, the need for more tests. Vaccines  Your health care provider may recommend certain vaccines, such as: Influenza vaccine. This is recommended every year. Tetanus, diphtheria, and acellular pertussis (Tdap, Td) vaccine. You may need a Td booster every 10 years. Zoster vaccine. You may need this after age 42. Pneumococcal 13-valent conjugate (PCV13) vaccine. One dose is recommended after age 74. Pneumococcal polysaccharide (PPSV23) vaccine. One dose is recommended after age 74. Talk to your health care provider about which screenings and vaccines  you need and how often you need  them. This information is not intended to replace advice given to you by your health care provider. Make sure you discuss any questions you have with your health care provider. Document Released: 01/29/2015 Document Revised: 09/22/2015 Document Reviewed: 11/03/2014 Elsevier Interactive Patient Education  2017 [place] Prevention in the Home Falls can cause injuries. They can happen to people of all ages. There are many things you can do to make your home safe and to help prevent falls. What can I do on the outside of my home? Regularly fix the edges of walkways and driveways and fix any cracks. Remove anything that might make you trip as you walk through a door, such as a raised step or threshold. Trim any bushes or trees on the path to your home. Use bright outdoor lighting. Clear any walking paths of anything that might make someone trip, such as rocks or tools. Regularly check to see if handrails are loose or broken. Make sure that both sides of any steps have handrails. Any raised decks and porches should have guardrails on the edges. Have any leaves, snow, or ice cleared regularly. Use sand or salt on walking paths during winter. Clean up any spills in your garage right away. This includes oil or grease spills. What can I do in the bathroom? Use night lights. Install grab bars by the toilet and in the tub and shower. Do not use towel bars as grab bars. Use non-skid mats or decals in the tub or shower. If you need to sit down in the shower, use a plastic, non-slip stool. Keep the floor dry. Clean up any water that spills on the floor as soon as it happens. Remove soap buildup in the tub or shower regularly. Attach bath mats securely with double-sided non-slip rug tape. Do not have throw rugs and other things on the floor that can make you trip. What can I do in the bedroom? Use night lights. Make sure that you have a light by your bed that is easy to reach. Do not use  any sheets or blankets that are too big for your bed. They should not hang down onto the floor. Have a firm chair that has side arms. You can use this for support while you get dressed. Do not have throw rugs and other things on the floor that can make you trip. What can I do in the kitchen? Clean up any spills right away. Avoid walking on wet floors. Keep items that you use a lot in easy-to-reach places. If you need to reach something above you, use a strong step stool that has a grab bar. Keep electrical cords out of the way. Do not use floor polish or wax that makes floors slippery. If you must use wax, use non-skid floor wax. Do not have throw rugs and other things on the floor that can make you trip. What can I do with my stairs? Do not leave any items on the stairs. Make sure that there are handrails on both sides of the stairs and use them. Fix handrails that are broken or loose. Make sure that handrails are as long as the stairways. Check any carpeting to make sure that it is firmly attached to the stairs. Fix any carpet that is loose or worn. Avoid having throw rugs at the top or bottom of the stairs. If you do have throw rugs, attach them to the floor with carpet tape. Make sure  that you have a light switch at the top of the stairs and the bottom of the stairs. If you do not have them, ask someone to add them for you. What else can I do to help prevent falls? Wear shoes that: Do not have high heels. Have rubber bottoms. Are comfortable and fit you well. Are closed at the toe. Do not wear sandals. If you use a stepladder: Make sure that it is fully opened. Do not climb a closed stepladder. Make sure that both sides of the stepladder are locked into place. Ask someone to hold it for you, if possible. Clearly mark and make sure that you can see: Any grab bars or handrails. First and last steps. Where the edge of each step is. Use tools that help you move around (mobility aids)  if they are needed. These include: Canes. Walkers. Scooters. Crutches. Turn on the lights when you go into a dark area. Replace any light bulbs as soon as they burn out. Set up your furniture so you have a clear path. Avoid moving your furniture around. If any of your floors are uneven, fix them. If there are any pets around you, be aware of where they are. Review your medicines with your doctor. Some medicines can make you feel dizzy. This can increase your chance of falling. Ask your doctor what other things that you can do to help prevent falls. This information is not intended to replace advice given to you by your health care provider. Make sure you discuss any questions you have with your health care provider. Document Released: 10/29/2008 Document Revised: 06/10/2015 Document Reviewed: 02/06/2014 Elsevier Interactive Patient Education  2017 [employer].

## 2020-09-08 ENCOUNTER — Other Ambulatory Visit: Payer: Self-pay | Admitting: Internal Medicine

## 2020-09-08 DIAGNOSIS — Z1231 Encounter for screening mammogram for malignant neoplasm of breast: Secondary | ICD-10-CM

## 2020-10-04 ENCOUNTER — Other Ambulatory Visit: Payer: Self-pay | Admitting: Gastroenterology

## 2020-10-25 ENCOUNTER — Other Ambulatory Visit: Payer: Self-pay

## 2020-10-25 ENCOUNTER — Ambulatory Visit
Admission: RE | Admit: 2020-10-25 | Discharge: 2020-10-25 | Disposition: A | Discharge status: Home / Self Care | Payer: Medicare Other | Source: Ambulatory Visit | Attending: Internal Medicine | Admitting: Internal Medicine

## 2020-10-25 DIAGNOSIS — Z1231 Encounter for screening mammogram for malignant neoplasm of breast: Secondary | ICD-10-CM

## 2020-12-07 ENCOUNTER — Ambulatory Visit: Payer: Medicare Other | Admitting: Neurology

## 2020-12-07 ENCOUNTER — Other Ambulatory Visit: Payer: Self-pay

## 2020-12-07 ENCOUNTER — Encounter: Payer: Self-pay | Admitting: Neurology

## 2020-12-07 VITALS — BP 179/77 | HR 70 | Ht 59.0 in | Wt 150.0 lb

## 2020-12-07 DIAGNOSIS — M5432 Sciatica, left side: Secondary | ICD-10-CM | POA: Diagnosis not present

## 2020-12-07 DIAGNOSIS — G3184 Mild cognitive impairment, so stated: Secondary | ICD-10-CM | POA: Diagnosis not present

## 2020-12-07 DIAGNOSIS — F332 Major depressive disorder, recurrent severe without psychotic features: Secondary | ICD-10-CM

## 2020-12-07 DIAGNOSIS — F411 Generalized anxiety disorder: Secondary | ICD-10-CM | POA: Diagnosis not present

## 2020-12-07 NOTE — Progress Notes (Signed)
NEUROLOGY FOLLOW UP OFFICE NOTE  Caitlin [name] [id number] November 22, 1945  HISTORY OF PRESENT ILLNESS: I had the pleasure of seeing Caitlin [name] in follow-up in the neurology clinic on 12/07/2020.  The patient was last seen 8 months ago for worsening memory. Records and images were personally reviewed where available.  She underwent repeat Neuropsychological testing in 06/2020 with primary deficits surrounding attention/concentration and verbal fluency, with a diagnosis of Mild Cognitive Impairment. Compared to prior testing in 2020, there was overall stability with mild declines in attention/concentration, executive functioning, and phonemic fluency. Depressive symptoms worsened, as well as reporting severe anxiety and severe depression, sleep dysfunction, with overall etiology of cognitive dysfunction still being combination of significant psychiatric distress (recently worsened due to complications surrounding IBS), diffuse chronic pain related to her history of fibromyalgia, and chronic sleep dysfunction. Current patterns of performance and cognitive/behavioral stability are not concerning for an underlying neurodegenerative process.   Since her last visit, she reports memory is unchanged. She denies getting lost driving. She would forget her medications if out of routine. The big problem is when she gets distracted. She continues to have trouble with her BiPAP mask, waking her up at night. She is also reporting pain going down her left leg, with cramps when she wakes up.    History on Initial Assessment 09/01/2014: This is a 75 yo RH woman with a history of hypertension, diet-controlled hyperlipidemia, IBS, GERD, chronic neck and back pain, depression and anxiety, who presented for worsening memory. She reports that memory changes started several years ago, she would be unable to think of a name or word she wanted to say. She has had to write everything down otherwise she would forget appointments. She  recalls the first time this happened was 12 years ago, she got a reminder call about a dentist appointment 2 days prior but still missed it. She is has been stressed and worried because 2 of her husband's family members have recently passed away with dementia. She continues to drive without getting lost. She denies missing her regular medications but occasionally forgets her vitamins. She has to stay by the stove when cooking, one time she walked away from soup she was making and forgot about it. Her husband is in charge of bill payments. She has always had problems multi-tasking, but has noticed this has been worse the past couple of years, she easily loses her train of thought when distracted. Her maternal grandmother and 2 brothers were diagnosed with dementia. Her mother and sister had memory problems before they passed away from other medical conditions. She denies any significant head injuries, no alcohol use.   Diagnostic Data: MRI brain in 2012 was personally reviewed today, unremarkable with mild diffuse atrophy. Similar to MRI brain done 08/2014 as above.   PAST MEDICAL HISTORY: Past Medical History:  Diagnosis Date   Allergic rhinitis 07/11/2006   Back pain 10/09/2007   Blood transfusion without reported diagnosis    Diaphragmatic hernia 07/11/2006   Diverticulosis of colon    Eczema 07/11/2006   Elevated cholesterol 04/29/2012   Enteritis    Erythema nodosum    Tends to be seasonal has had a negative chest x-ray in the past negative PPD   Essential hypertension 07/11/2006   Fatigue 03/12/2007   Fibromyalgia    Gastroparesis 12/23/2008   Generalized anxiety disorder 09/01/2014   GERD (gastroesophageal reflux disease)    History of stricture and dilatation and Nissen surgery   Hearing loss, left ear 11/27/2008  History of cataract    bilateral; s/p surgery   Hyperglycemia 08/15/2006   Irritable bowel syndrome 12/23/2008   Leg cramps, nocturnal 11/11/2007   Major depressive  disorder 09/10/2006   MCI (mild cognitive impairment) 10/22/2018   Obstructive sleep apnea 04/23/2007   uses CPAP nightly; mask has leak   Osteoarthritis 11/28/2011   Osteopenia    Prediabetes 04/19/2011   Vitamin B12 deficiency 05/13/2010    MEDICATIONS: Current Outpatient Medications on File Prior to Visit  Medication Sig Dispense Refill   AMBULATORY NON FORMULARY MEDICATION Medication Name: nitroglycerin 0.125% gel . Apply a pea size amount to your rectum three times daily x 6-8 weeks. 30 g 1   cholecalciferol (VITAMIN D) 1000 UNITS tablet Take 2,000 Units by mouth daily.     Dexlansoprazole (DEXILANT) 30 MG capsule Take 1 capsule (30 mg total) by mouth daily before breakfast. 30 capsule 11   dicyclomine (BENTYL) 10 MG capsule TAKE 1 CAPSULE BY MOUTH EVERY 8 HOURS AS NEEDED FOR  SPASM 90 capsule 1   escitalopram (LEXAPRO) 10 MG tablet Take 1 tablet (10 mg total) by mouth daily. 90 tablet 2   hydrochlorothiazide (HYDRODIURIL) 12.5 MG tablet Take 1 tablet (12.5 mg total) by mouth daily. 90 tablet 2   potassium chloride (KLOR-CON) 10 MEQ tablet TAKE 1 TABLET BY MOUTH EVERY OTHER DAY WITH  FLUID  TABLET 45 tablet 2   No current facility-administered medications on file prior to visit.    ALLERGIES: Allergies  Allergen Reactions   Mobic [Meloxicam] Diarrhea   Aleve [Naproxen]     Upset stomach if takes more than once a day   Codeine Phosphate Other (See Comments)    Hallucinations   Colestipol Hcl     Patient reports reaction is "erythema multiforme"   Omeprazole-Sodium Bicarbonate     Sever diarrhea, stomach cramps   Other     States she can't tolerate milk, yogurt, cheese , ice cream and diarrhea   Protonix [Pantoprazole Sodium] Diarrhea    Leg nodules   Sodium     Other reaction(s): Other (See Comments) unknown   Sulfamethoxazole Diarrhea and Nausea And Vomiting    FAMILY HISTORY: Family History  Problem Relation Age of Onset   Depression Mother    Kidney cancer  Mother    Dementia Mother        Unspecified, likely secondary to other medical condition (cancer)   Depression Father    Hiatal hernia Father    COPD Sister    Dementia Sister        Unspecified, likely secondary to other medical condition   Nephrolithiasis Other    Colon cancer Neg Hx    Esophageal cancer Neg Hx    Pancreatic cancer Neg Hx    Rectal cancer Neg Hx    Stomach cancer Neg Hx     SOCIAL HISTORY: Social History   Socioeconomic History   Marital status: Married    Spouse name: Not on file   Number of children: 1   Years of education: 15   Highest education level: Some college, no degree  Occupational History   Occupation: Retired  Tobacco Use   Smoking status: Never   Smokeless tobacco: Never  Vaping Use   Vaping Use: Never used  Substance and Sexual Activity   Alcohol use: No    Alcohol/week: 0.0 standard drinks   Drug use: No   Sexual activity: Not on file  Other Topics Concern   Not on file  Social  History Narrative   Unemployed   Married   [place] of 2    Has grandchildren out of state   No pets   Sis. died 12/19/09 COPD      Right handed   Social Determinants of Health   Financial Resource Strain: Low Risk    Difficulty of Paying Living Expenses: Not hard at all  Food Insecurity: No Food Insecurity   Worried About Charity fundraiser in the Last Year: Never true   Arboriculturist in the Last Year: Never true  Transportation Needs: No Transportation Needs   Lack of Transportation (Medical): No   Lack of Transportation (Non-Medical): No  Physical Activity: Inactive   Days of Exercise per Week: 0 days   Minutes of Exercise per Session: 0 min  Stress: Not on file  Social Connections: Moderately Integrated   Frequency of Communication with Friends and Family: Twice a week   Frequency of Social Gatherings with Friends and Family: Twice a week   Attends Religious Services: 1 to 4 times per year   Active Member of [employer] or Organizations: No    Attends Music therapist: Never   Marital Status: Married  Human resources officer Violence: Not At Risk   Fear of Current or Ex-Partner: No   Emotionally Abused: No   Physically Abused: No   Sexually Abused: No     PHYSICAL EXAM: Vitals:   12/07/20 1507  BP: (!) 179/77  Pulse: 70  SpO2: 95%   General: No acute distress Head:  Normocephalic/atraumatic Skin/Extremities: No rash, no edema Neurological Exam: alert and awake. No aphasia or dysarthria. Fund of knowledge is appropriate.  Recent and remote memory are intact.  Attention and concentration are normal.   Cranial nerves: Pupils equal, round. Extraocular movements intact with no nystagmus.  No facial asymmetry.  Motor: Bulk and tone normal, muscle strength 5/5 throughout with no pronator drift.   Finger to nose testing intact.  Gait slow and cautious reporting left leg pain.   IMPRESSION: This is a 75 yo RH woman with a history of hypertension, diet-controlled hyperlipidemia, depression, anxiety, GERD,who presented for worsening memory loss. MRI brain unremarkable, TSH and B12 normal. Neuropsychological testing in October 2020 and most recently in June 2022 indicated Mild Neurocognitive disorder that was multifactorial in nature, including significant psychiatric distress, chronic pain due to fibromyalgia, and variable sleep disturbances. Patterns of performance and cognitive/behavioral stability are not concerning for an underlying neurodegenerative process. Findings discussed with patient. She continues to report issue with sleep, pain, mood. She will ask her daughter for Behavioral Health recommendations, we are happy to send a referral. She reports symptoms suggestive of left sciatica, refer to PT. If no improvement, discuss Ortho referral with PCP. Follow-up as needed, call for any changes.   Thank you for allowing me to participate in her care.  Please do not hesitate to call for any questions or concerns.    [name], M.D.   CC: Dr. Regis Bill

## 2020-12-07 NOTE — Patient Instructions (Signed)
Your extensive memory evaluations in 2020 and most recently in June 2022 have not shown any indication of dementia or Alzheimer's disease. Memory changes are likely due to severe anxiety, depression, and sleep dysfunction. These are treatable causes of memory changes. Let your PCP or our office know where to send referral for Psychiatry/therapy. Discuss BiPAP mask issues with your sleep specialist.  Referral will be sent for sciatic pain. If no improvement, would see Ortho.  Follow-up as needed, call for any changes.   RECOMMENDATIONS FOR ALL PATIENTS WITH MEMORY PROBLEMS: 1. Continue to exercise (Recommend 30 minutes of walking everyday, or 3 hours every week) 2. Increase social interactions - continue going to [place] and enjoy social gatherings with friends and family 3. Eat healthy, avoid fried foods and eat more fruits and vegetables 4. Maintain adequate blood pressure, blood sugar, and blood cholesterol level. Reducing the risk of stroke and cardiovascular disease also helps promoting better memory. 5. Avoid stressful situations. Live a simple life and avoid aggravations. Organize your time and prepare for the next day in anticipation. 6. Sleep well, avoid any interruptions of sleep and avoid any distractions in the bedroom that may interfere with adequate sleep quality 7. Avoid sugar, avoid sweets as there is a strong link between excessive sugar intake, diabetes, and cognitive impairment We discussed the Mediterranean diet, which has been shown to help patients reduce the risk of progressive memory disorders and reduces cardiovascular risk. This includes eating fish, eat fruits and green leafy vegetables, nuts like almonds and hazelnuts, walnuts, and also use olive oil. Avoid fast foods and fried foods as much as possible. Avoid sweets and sugar as sugar use has been linked to worsening of memory function.

## 2021-01-18 NOTE — Therapy (Signed)
OUTPATIENT PHYSICAL THERAPY THORACOLUMBAR EVALUATION   Patient Name: Caitlin [name] MRN: [id number] DOB:17-Aug-1945, 76 y.o., female Today's Date: 01/19/2021   PT End of Session - 01/19/21 1439     Visit Number 1    Number of Visits 7    Date for PT Re-Evaluation 03/05/21    Authorization Type UHC MCR    PT Start Time 0254    PT Stop Time 2706    PT Time Calculation (min) 42 min    Activity Tolerance Patient tolerated treatment well    Behavior During Therapy [facility] for tasks assessed/performed             Past Medical History:  Diagnosis Date   Allergic rhinitis 07/11/2006   Back pain 10/09/2007   Blood transfusion without reported diagnosis    Diaphragmatic hernia 07/11/2006   Diverticulosis of colon    Eczema 07/11/2006   Elevated cholesterol 04/29/2012   Enteritis    Erythema nodosum    Tends to be seasonal has had a negative chest x-ray in the past negative PPD   Essential hypertension 07/11/2006   Fatigue 03/12/2007   Fibromyalgia    Gastroparesis 12/23/2008   Generalized anxiety disorder 09/01/2014   GERD (gastroesophageal reflux disease)    History of stricture and dilatation and Nissen surgery   Hearing loss, left ear 11/27/2008   History of cataract    bilateral; s/p surgery   Hyperglycemia 08/15/2006   Irritable bowel syndrome 12/23/2008   Leg cramps, nocturnal 11/11/2007   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   MCI (mild cognitive impairment) 10/22/2018   Obstructive sleep apnea 04/23/2007   uses CPAP nightly; mask has leak   Osteoarthritis 11/28/2011   Osteopenia    Prediabetes 04/19/2011   Vitamin B12 deficiency 05/13/2010   Past Surgical History:  Procedure Laterality Date   ABDOMINAL HYSTERECTOMY     bone spur     CATARACT EXTRACTION W/ INTRAOCULAR LENS  IMPLANT, BILATERAL     CESAREAN SECTION     x 1   CHOLECYSTECTOMY     COLONOSCOPY     DENTAL SURGERY     ESOPHAGOGASTRODUODENOSCOPY     stricture and dilatation   left shoulder surgery   06/08, 07/12/17   x 2   MOUTH SURGERY Right 23/76/2831   NISSEN FUNDOPLICATION     ROTATOR CUFF REPAIR Left    rt knee surgery     scope   UPPER GASTROINTESTINAL ENDOSCOPY     Patient Active Problem List   Diagnosis Date Noted   MCI (mild cognitive impairment) 10/22/2018   Generalized anxiety disorder 09/01/2014   Hair loss 08/12/2013   Erythema nodosum 06/05/2012   Elevated cholesterol 04/29/2012   Osteoarthritis 11/28/2011   GERD (gastroesophageal reflux disease)    Prediabetes 04/19/2011   Hypotension 06/07/2010   Vitamin B12 deficiency 05/13/2010   Gastroparesis 12/23/2008   Irritable bowel syndrome 12/23/2008   Hearing loss, left ear 11/27/2008   Leg cramps, nocturnal 11/11/2007   Back pain 10/09/2007   Obstructive sleep apnea 04/23/2007   Fatigue 03/12/2007   Diverticulosis of colon 12/10/2006   Fibromyalgia 12/10/2006   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   Hyperglycemia 08/15/2006   Essential hypertension 07/11/2006   Allergic rhinitis 07/11/2006   Diaphragmatic hernia 07/11/2006   Eczema 07/11/2006    PCP: Burnis Medin, MD  REFERRING PROVIDER: Cameron Sprang, MD  REFERRING DIAG: Diagnosis G31.84 (ICD-10-CM) - MCI (mild cognitive impairment) F33.2 (ICD-10-CM) - Severe episode of recurrent major depressive disorder, without psychotic features (  [place]) F41.1 (ICD-10-CM) - Generalized anxiety disorder M54.32 (ICD-10-CM) - Left sciatic nerve pain   THERAPY DIAG:  Chronic bilateral low back pain with left-sided sciatica  Muscle weakness (generalized)  ONSET DATE: 2019  SUBJECTIVE:                                                                                                                                                                                           SUBJECTIVE STATEMENT: Patient reports the left leg goes numb sometimes. The numbness courses along the Lt anterior thigh and lower leg noticing it if she is sitting using the bathroom or at night  when she is laying down. She reports this has been ongoing since 2019 reporting a fall that occurred in 2019 that could have perpetuated her pain, though she is unsure. She reports chronic back pain for several years of insidious onset. She reports pain in bilateral posterior thighs upon first waking in the morning described as aching that can improve as the day goes on. She denies any changes in bowel/bladder. No red flag symptoms.   PERTINENT HISTORY:  Mild cognitive impairment, osteopenia, fibromyalgia  PAIN:  Are you having pain? Yes VAS scale: 5/10 Pain location: lower back  Pain orientation: Bilateral  PAIN TYPE: dull Pain description: intermittent  Aggravating factors: strenuous activity  Relieving factors: heat   PRECAUTIONS: None  WEIGHT BEARING RESTRICTIONS No  FALLS:  Has patient fallen in last 6 months? No, Number of falls: 0; reports fear of falling   LIVING ENVIRONMENT: Lives with: lives with their spouse Lives in: House/apartment Stairs: No;  Has following equipment at home: None  OCCUPATION: retired   PLOF: Independent  PATIENT GOALS "Stronger. I don't want to feel like I am going to go forward and fall on my face, my balance is not as good."    OBJECTIVE:   DIAGNOSTIC FINDINGS:  N/A  PATIENT SURVEYS:  FOTO incorrect body part   SCREENING FOR RED FLAGS: Bowel or bladder incontinence: No Cauda equina syndrome: No   COGNITION:  Overall cognitive status: PMH of mild cognitive impairment     SENSATION:  Light touch: Appears intact   MUSCLE LENGTH: Hamstrings: moderate tightness bilaterally Quadriceps: moderate tightness bilaterally   POSTURE:  Decreased lordosis   PALPATION: Tautness and palpable tenderness bilateral lumbar paraspinals   LUMBARAROM/PROM  A/PROM A/PROM  01/19/2021  Flexion Fingertips 8 inches from floor  Extension 75% limited   Right lateral flexion WNL  Left lateral flexion WNL  Right rotation WNL  Left rotation WNL    (Blank rows = not tested)  LE AROM/PROM:  A/PROM Right 01/19/2021 Left 01/19/2021  Hip flexion    Hip extension    Hip abduction    Hip adduction    Hip internal rotation    Hip external rotation    Knee flexion    Knee extension    Ankle dorsiflexion    Ankle plantarflexion    Ankle inversion    Ankle eversion     (Blank rows = not tested)  LE MMT:  MMT Right 01/19/2021 Left 01/19/2021  Hip flexion 4+ 4- (pain in low back)  Hip extension 4- 4-  Hip abduction 4+ 4+  Knee flexion 4+ 4+  Knee extension 5 5  Ankle dorsiflexion 5 5  Ankle plantarflexion 5 5  Ankle inversion    Ankle eversion 5 5   (Blank rows = not tested)  LUMBAR SPECIAL TESTS:  SLR: (+) bilaterally   FUNCTIONAL TESTS:  5 times sit to stand: 12 seconds  SLS<3 seconds bilaterally, hesitant to perform   GAIT: Not assessed     TODAY'S TREATMENT  TREATMENT 01/19/21  Therapeutic Exercise: - Demonstrated and issued HEP.   Manual Therapy: - N/A  Neuromuscular re-ed: - N/A  Therapeutic Activity: - Education on assessment findings and POC.   Self-care/Home Management: - N/A    PATIENT EDUCATION:  Education details: See treatment above Person educated: Patient Education method: Explanation, Demonstration, Tactile cues, Verbal cues, and Handouts Education comprehension: verbalized understanding, returned demonstration, verbal cues required, tactile cues required, and needs further education   HOME EXERCISE PROGRAM: Access Code: [email or web address]  ASSESSMENT:  CLINICAL IMPRESSION: Patient is a 75 y.o. female who was seen today for physical therapy evaluation and treatment for low back pain with LLE referral. Her chief complaint is numbness about the anterior aspect of the left thigh and lower leg that occur with prolonged sitting or lying down, though unable to provoke this pain during evaluation today. Overall she has good lumbar AROM with the exception of lumbar extension as this is significantly  limited. She has bilateral hip weakness Lt>Rt. While she scores at a low fall risk based upon her 5xSTS she reports a fear of falling and has poor SL balance bilaterally. She will benefit from skilled PT to address the above stated deficits in order to optimize her function and reduce her risk of future falls.   REHAB POTENTIAL: Good  CLINICAL DECISION MAKING: Stable/uncomplicated  EVALUATION COMPLEXITY: Low   GOALS:  SHORT TERM GOALS:  STG Name Target Date Goal status  1 Patient will be independent and compliant with initial HEP.   Baseline:  02/02/2021 INITIAL  2 Patient will demonstrate knowledge and application of appropriate sitting posture to reduce stress on her low back with prolonged sitting.  Baseline:  02/09/2021 INITIAL   LONG TERM GOALS:   LTG Name Target Date Goal status  1 Patient will improve lumbar extension by at least 25% to improve ability complete reaching activity Baseline: 75% limited  03/02/2021 INITIAL  2 Patient will maintain SLS for at least 5 seconds to improve her stability with entering/exiting her bathtub Baseline: 03/02/2021 INITIAL  3 Patient will demonstrate at least 4+/5 Lt hip extensor and abductor strength to improve overall lumbopelvic stability.  Baseline:see objective 03/02/2021 INITIAL  4 Patient will be independent in advanced home program to assist in management of her chronic condition.  Baseline: 03/02/2021 INITIAL   PLAN: PT FREQUENCY: 1x/week  PT DURATION: 6 weeks  PLANNED INTERVENTIONS: Therapeutic exercises, Therapeutic activity, Neuro Muscular re-education, Balance training, Gait training, Patient/Family education, Joint mobilization, Stair training, Dry Needling,  Cryotherapy, Moist heat, Taping, and Manual therapy  PLAN FOR NEXT SESSION: capture FOTO (wrong body part), review HEP, core and hip strengthening, consider repeated movement.   Gwendolyn Grant, PT, DPT, ATC 01/19/21 7:01 PM

## 2021-01-19 ENCOUNTER — Other Ambulatory Visit: Payer: Self-pay

## 2021-01-19 ENCOUNTER — Ambulatory Visit: Payer: Medicare Other | Attending: Neurology

## 2021-01-19 DIAGNOSIS — F332 Major depressive disorder, recurrent severe without psychotic features: Secondary | ICD-10-CM | POA: Insufficient documentation

## 2021-01-19 DIAGNOSIS — M6281 Muscle weakness (generalized): Secondary | ICD-10-CM | POA: Insufficient documentation

## 2021-01-19 DIAGNOSIS — M5432 Sciatica, left side: Secondary | ICD-10-CM | POA: Diagnosis not present

## 2021-01-19 DIAGNOSIS — F411 Generalized anxiety disorder: Secondary | ICD-10-CM | POA: Diagnosis not present

## 2021-01-19 DIAGNOSIS — G8929 Other chronic pain: Secondary | ICD-10-CM | POA: Insufficient documentation

## 2021-01-19 DIAGNOSIS — M5442 Lumbago with sciatica, left side: Secondary | ICD-10-CM | POA: Diagnosis not present

## 2021-01-19 DIAGNOSIS — G3184 Mild cognitive impairment, so stated: Secondary | ICD-10-CM | POA: Diagnosis not present

## 2021-01-21 NOTE — Therapy (Signed)
OUTPATIENT PHYSICAL THERAPY TREATMENT NOTE   Patient Name: Caitlin [name] MRN: [id number] DOB:1945/12/30, 76 y.o., female Today's Date: 01/24/2021  PCP: Burnis Medin, MD REFERRING PROVIDER: Burnis Medin, MD   PT End of Session - 01/24/21 1412     Visit Number 2    Number of Visits 7    Date for PT Re-Evaluation 03/05/21    Authorization Type UHC MCR    PT Start Time 1275    PT Stop Time 1700    PT Time Calculation (min) 45 min    Activity Tolerance Patient tolerated treatment well    Behavior During Therapy [facility] for tasks assessed/performed             Past Medical History:  Diagnosis Date   Allergic rhinitis 07/11/2006   Back pain 10/09/2007   Blood transfusion without reported diagnosis    Diaphragmatic hernia 07/11/2006   Diverticulosis of colon    Eczema 07/11/2006   Elevated cholesterol 04/29/2012   Enteritis    Erythema nodosum    Tends to be seasonal has had a negative chest x-ray in the past negative PPD   Essential hypertension 07/11/2006   Fatigue 03/12/2007   Fibromyalgia    Gastroparesis 12/23/2008   Generalized anxiety disorder 09/01/2014   GERD (gastroesophageal reflux disease)    History of stricture and dilatation and Nissen surgery   Hearing loss, left ear 11/27/2008   History of cataract    bilateral; s/p surgery   Hyperglycemia 08/15/2006   Irritable bowel syndrome 12/23/2008   Leg cramps, nocturnal 11/11/2007   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   MCI (mild cognitive impairment) 10/22/2018   Obstructive sleep apnea 04/23/2007   uses CPAP nightly; mask has leak   Osteoarthritis 11/28/2011   Osteopenia    Prediabetes 04/19/2011   Vitamin B12 deficiency 05/13/2010   Past Surgical History:  Procedure Laterality Date   ABDOMINAL HYSTERECTOMY     bone spur     CATARACT EXTRACTION W/ INTRAOCULAR LENS  IMPLANT, BILATERAL     CESAREAN SECTION     x 1   CHOLECYSTECTOMY     COLONOSCOPY     DENTAL SURGERY      ESOPHAGOGASTRODUODENOSCOPY     stricture and dilatation   left shoulder surgery  06/08, 07/12/17   x 2   MOUTH SURGERY Right 17/49/4496   NISSEN FUNDOPLICATION     ROTATOR CUFF REPAIR Left    rt knee surgery     scope   UPPER GASTROINTESTINAL ENDOSCOPY     Patient Active Problem List   Diagnosis Date Noted   MCI (mild cognitive impairment) 10/22/2018   Generalized anxiety disorder 09/01/2014   Hair loss 08/12/2013   Erythema nodosum 06/05/2012   Elevated cholesterol 04/29/2012   Osteoarthritis 11/28/2011   GERD (gastroesophageal reflux disease)    Prediabetes 04/19/2011   Hypotension 06/07/2010   Vitamin B12 deficiency 05/13/2010   Gastroparesis 12/23/2008   Irritable bowel syndrome 12/23/2008   Hearing loss, left ear 11/27/2008   Leg cramps, nocturnal 11/11/2007   Back pain 10/09/2007   Obstructive sleep apnea 04/23/2007   Fatigue 03/12/2007   Diverticulosis of colon 12/10/2006   Fibromyalgia 12/10/2006   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   Hyperglycemia 08/15/2006   Essential hypertension 07/11/2006   Allergic rhinitis 07/11/2006   Diaphragmatic hernia 07/11/2006   Eczema 07/11/2006    REFERRING DIAG: Diagnosis G31.84 (ICD-10-CM) - MCI (mild cognitive impairment) F33.2 (ICD-10-CM) - Severe episode of recurrent major depressive disorder, without psychotic features ([place])  F41.1 (ICD-10-CM) - Generalized anxiety disorder M54.32 (ICD-10-CM) - Left sciatic nerve pain   THERAPY DIAG:  Chronic bilateral low back pain with left-sided sciatica  Muscle weakness (generalized)  PERTINENT HISTORY: Mild cognitive impairment, osteopenia, fibromyalgia  PRECAUTIONS: none  SUBJECTIVE: Patient reports she is feeling much better. She has been completing her exercises and they are getting a little easier.   PAIN:  Are you having pain? Yes VAS scale: 4/10 Pain location: low back Pain orientation: Posterior  PAIN TYPE: "feels like my back is going to cave in on me" Pain  description: intermittent  Aggravating factors: tension Relieving factors: relax      OBJECTIVE:   Unless otherwise noted all objective measures were obtained on initial evaluation.  DIAGNOSTIC FINDINGS:  N/A   PATIENT SURVEYS:  01/24/21 FOTO 50% function to 57% predicted    SCREENING FOR RED FLAGS: Bowel or bladder incontinence: No Cauda equina syndrome: No     COGNITION:          Overall cognitive status: PMH of mild cognitive impairment                    SENSATION:          Light touch: Appears intact           MUSCLE LENGTH: Hamstrings: moderate tightness bilaterally Quadriceps: moderate tightness bilaterally    POSTURE:  Decreased lordosis    PALPATION: Tautness and palpable tenderness bilateral lumbar paraspinals    LUMBARAROM/PROM   A/PROM A/PROM  01/19/2021  Flexion Fingertips 8 inches from floor  Extension 75% limited   Right lateral flexion WNL  Left lateral flexion WNL  Right rotation WNL  Left rotation WNL   (Blank rows = not tested)   LE AROM/PROM:   A/PROM Right 01/19/2021 Left 01/19/2021  Hip flexion      Hip extension      Hip abduction      Hip adduction      Hip internal rotation      Hip external rotation      Knee flexion      Knee extension      Ankle dorsiflexion      Ankle plantarflexion      Ankle inversion      Ankle eversion       (Blank rows = not tested)   LE MMT:   MMT Right 01/19/2021 Left 01/19/2021  Hip flexion 4+ 4- (pain in low back)  Hip extension 4- 4-  Hip abduction 4+ 4+  Knee flexion 4+ 4+  Knee extension 5 5  Ankle dorsiflexion 5 5  Ankle plantarflexion 5 5  Ankle inversion      Ankle eversion 5 5   (Blank rows = not tested)   LUMBAR SPECIAL TESTS:  SLR: (+) bilaterally    FUNCTIONAL TESTS:  5 times sit to stand: 12 seconds  SLS<3 seconds bilaterally, hesitant to perform    GAIT: Not assessed        TODAY'S TREATMENT  [facility] Adult PT Treatment:                                                 DATE: 01/24/21 Therapeutic Exercise: Seated hamstring stretch 30 sec  Hip bridge 2 x 10  Supine pelvic tilts 2 x 10  Supine hip flexor stretch 30 sec  Resisted  hip abduction 2 x 10 green band  Updated HEP  Manual Therapy: N/A  Self Care: Education on FOTO score as it relates to overall function Posture education discussing appropriate sitting and standing posture   TREATMENT 01/19/21   Therapeutic Exercise: - Demonstrated and issued HEP.    Manual Therapy: - N/A   Neuromuscular re-ed: - N/A   Therapeutic Activity: - Education on assessment findings and POC.    Self-care/Home Management: - N/A       PATIENT EDUCATION:  Education details: See treatment above Person educated: Patient Education method: Explanation, Demonstration, Tactile cues, Verbal cues, and Handouts Education comprehension: verbalized understanding, returned demonstration, verbal cues required, tactile cues required     HOME EXERCISE PROGRAM: Access Code: [email or web address]   ASSESSMENT:   CLINICAL IMPRESSION:  FOTO score captured today with patient scoring at 50% function. Able to review initial HEP with patient requiring minimal cues for appropriate setup. She demonstrates good form with hip and core strengthening, though reports feeling "wobbly" with all strengthening. Began posture education focusing on appropriate seated posture as patient continues to report anterior thigh numbness occasionally occurs with prolonged sitting. No reports of increased pain throughout session.    REHAB POTENTIAL: Good   CLINICAL DECISION MAKING: Stable/uncomplicated   EVALUATION COMPLEXITY: Low     GOALS:   SHORT TERM GOALS:   STG Name Target Date Goal status  1 Patient will be independent and compliant with initial HEP.    Baseline:  02/02/2021 ONGOING  2 Patient will demonstrate knowledge and application of appropriate sitting posture to reduce stress on her low back with prolonged sitting.  Baseline:  02/09/2021  INITIAL    LONG TERM GOALS:    LTG Name Target Date Goal status  1 Patient will improve lumbar extension by at least 25% to improve ability complete reaching activity Baseline: 75% limited  03/02/2021 INITIAL  2 Patient will maintain SLS for at least 5 seconds to improve her stability with entering/exiting her bathtub Baseline: 03/02/2021 INITIAL  3 Patient will demonstrate at least 4+/5 Lt hip extensor and abductor strength to improve overall lumbopelvic stability.  Baseline:see objective 03/02/2021 INITIAL  4 Patient will be independent in advanced home program to assist in management of her chronic condition.  Baseline: 03/02/2021 INITIAL    PLAN: PT FREQUENCY: 1x/week   PT DURATION: 6 weeks   PLANNED INTERVENTIONS: Therapeutic exercises, Therapeutic activity, Neuro Muscular re-education, Balance training, Gait training, Patient/Family education, Joint mobilization, Stair training, Dry Needling, Cryotherapy, Moist heat, Taping, and Manual therapy   PLAN FOR NEXT SESSION:  review HEP, core and hip strengthening, consider repeated movement.   Gwendolyn Grant, PT, DPT, ATC 01/24/21 3:00 PM

## 2021-01-24 ENCOUNTER — Ambulatory Visit: Payer: Medicare Other

## 2021-01-24 ENCOUNTER — Emergency Department (HOSPITAL_BASED_OUTPATIENT_CLINIC_OR_DEPARTMENT_OTHER): Payer: Medicare Other

## 2021-01-24 ENCOUNTER — Emergency Department (HOSPITAL_BASED_OUTPATIENT_CLINIC_OR_DEPARTMENT_OTHER)
Admission: EM | Admit: 2021-01-24 | Discharge: 2021-01-24 | Disposition: A | Discharge status: Home / Self Care | Payer: Medicare Other | Attending: Emergency Medicine | Admitting: Emergency Medicine

## 2021-01-24 ENCOUNTER — Other Ambulatory Visit: Payer: Self-pay

## 2021-01-24 DIAGNOSIS — I10 Essential (primary) hypertension: Secondary | ICD-10-CM | POA: Diagnosis not present

## 2021-01-24 DIAGNOSIS — S0003XA Contusion of scalp, initial encounter: Secondary | ICD-10-CM | POA: Diagnosis not present

## 2021-01-24 DIAGNOSIS — G8929 Other chronic pain: Secondary | ICD-10-CM

## 2021-01-24 DIAGNOSIS — Y92009 Unspecified place in unspecified non-institutional (private) residence as the place of occurrence of the external cause: Secondary | ICD-10-CM | POA: Diagnosis not present

## 2021-01-24 DIAGNOSIS — W0110XA Fall on same level from slipping, tripping and stumbling with subsequent striking against unspecified object, initial encounter: Secondary | ICD-10-CM | POA: Insufficient documentation

## 2021-01-24 DIAGNOSIS — W19XXXA Unspecified fall, initial encounter: Secondary | ICD-10-CM

## 2021-01-24 DIAGNOSIS — Z79899 Other long term (current) drug therapy: Secondary | ICD-10-CM | POA: Diagnosis not present

## 2021-01-24 DIAGNOSIS — M6281 Muscle weakness (generalized): Secondary | ICD-10-CM

## 2021-01-24 DIAGNOSIS — M5442 Lumbago with sciatica, left side: Secondary | ICD-10-CM | POA: Diagnosis not present

## 2021-01-24 NOTE — ED Triage Notes (Signed)
Pt arrives POV with her husband.  States she lost her balance about 40 minutes ago causing her to fall backwards and hit her head.  Denies LOC. Redness noted to crown of head.

## 2021-01-24 NOTE — Patient Instructions (Signed)

## 2021-01-24 NOTE — ED Provider Notes (Signed)
[facility] EMERGENCY DEPT Provider Note   CSN: [id number] Arrival date & time: 01/24/21  1959     History  Chief Complaint  Patient presents with   Caitlin [name] Caitlin [name] is a 76 y.o. female.  The history is provided by the patient.  Fall She has history of hypertension, GERD, Mnire's disease and comes in after falling at home.  She lost her balance and fell back striking her occiput.  There was no loss of consciousness.  She denies other injury.  She has not had previous problems with falls.  He is not on any anticoagulants.   Home Medications Prior to Admission medications   Medication Sig Start Date End Date Taking? Authorizing Provider  dicyclomine (BENTYL) 10 MG capsule TAKE 1 CAPSULE BY MOUTH EVERY 8 HOURS AS NEEDED FOR  SPASM 10/04/20  Yes Nandigam, Venia Minks, MD  esomeprazole (NEXIUM) 20 MG capsule Take 20 mg by mouth daily at 12 noon.   Yes [provider]  hydrochlorothiazide (HYDRODIURIL) 12.5 MG tablet Take 1 tablet (12.5 mg total) by mouth daily. 05/17/20  Yes Panosh, Standley Brooking, MD  potassium chloride (KLOR-CON) 10 MEQ tablet TAKE 1 TABLET BY MOUTH EVERY OTHER DAY WITH  FLUID  TABLET 05/17/20  Yes Panosh, Standley Brooking, MD  AMBULATORY NON FORMULARY MEDICATION Medication Name: nitroglycerin 0.125% gel . Apply a pea size amount to your rectum three times daily x 6-8 weeks. Patient not taking: Reported on 01/19/2021 03/17/19   Mauri Pole, MD  cholecalciferol (VITAMIN D) 1000 UNITS tablet Take 2,000 Units by mouth daily. Patient not taking: Reported on 01/19/2021    [provider]  Dexlansoprazole (DEXILANT) 30 MG capsule Take 1 capsule (30 mg total) by mouth daily before breakfast. Patient not taking: Reported on 01/24/2021 03/08/20   Mauri Pole, MD  escitalopram (LEXAPRO) 10 MG tablet Take 1 tablet (10 mg total) by mouth daily. 05/17/20   Panosh, Standley Brooking, MD      Allergies    Mobic [meloxicam], Aleve [naproxen], Codeine phosphate,  Colestipol hcl, Omeprazole-sodium bicarbonate, Other, Protonix [pantoprazole sodium], Sodium, and Sulfamethoxazole    Review of Systems   Review of Systems  All other systems reviewed and are negative.  Physical Exam Updated Vital Signs BP (!) 183/69 (BP Location: Right Arm)    Pulse 67    Temp (!) 97 F (36.1 C)    Resp 16    Ht 4\' 10"  (1.473 m)    Wt 65.3 kg    SpO2 99%    BMI 30.10 kg/m  Physical Exam Vitals and nursing note reviewed.  76 year old female, resting comfortably and in no acute distress. Vital signs are significant for elevated blood pressure. Oxygen saturation is 99%, which is normal. Head is normocephalic.  Small hematoma present on the left side of occiput. PERRLA, EOMI. Oropharynx is clear. Neck is nontender and supple without adenopathy or JVD. Back is nontender and there is no CVA tenderness. Lungs are clear without rales, wheezes, or rhonchi. Chest is nontender. Heart has regular rate and rhythm without murmur. Abdomen is soft, flat, nontender. Extremities have no cyanosis or edema, full range of motion is present. Skin is warm and dry without rash. Neurologic: Mental status is normal, cranial nerves are intact, strength is 5/5 in both arms and both legs.  ED Results / Procedures / Treatments    Radiology CT Head Wo Contrast  Result Date: 01/24/2021 CLINICAL DATA:  Head trauma EXAM: CT HEAD WITHOUT  CONTRAST TECHNIQUE: Contiguous axial images were obtained from the base of the skull through the vertex without intravenous contrast. COMPARISON:  None. FINDINGS: Brain: No acute territorial infarction, hemorrhage, or intracranial mass. Patchy white matter hypodensity probably chronic small vessel ischemic change. Mild ventricular enlargement somewhat out of proportion to cortical volume loss. Vascular: No hyperdense vessels.  Carotid vascular calcification Skull: Normal. Negative for fracture or focal lesion. Sinuses/Orbits: No acute finding. Other: None IMPRESSION: 1.  No CT evident for acute intracranial abnormality. 2. Slight ventricular prominence which may be incongruent with degree of cortical volume loss, cannot exclude normal pressure hydrocephalus. Electronically Signed   By: Donavan Foil M.D.   On: 01/24/2021 21:00    Procedures Procedures    Medications Ordered in ED Medications - No data to display  ED Course/ Medical Decision Making/ A&P                           Medical Decision Making  Fall with contusion to occiput.  Patient is sent for CT of head which shows no evidence of intracranial injury.  I have independently viewed the images and agree with radiologist interpretation.  She is discharged with fall prevention instructions, advised to apply ice and use over-the-counter analgesics as needed for pain.  Old records are reviewed, and she has no relevant past visits.        Final Clinical Impression(s) / ED Diagnoses Final diagnoses:  Fall at home, initial encounter  Contusion of occipital region of scalp, initial encounter  Elevated blood pressure reading with diagnosis of hypertension    Rx / DC Orders ED Discharge Orders     None         Delora Fuel, MD 94/32/76 [id number]

## 2021-01-24 NOTE — Discharge Instructions (Addendum)
Apply ice for thirty minutes at a time, four times a day.  Take ibuprofen or acetaminophen as needed for pain.

## 2021-01-31 ENCOUNTER — Other Ambulatory Visit: Payer: Self-pay | Admitting: Internal Medicine

## 2021-01-31 ENCOUNTER — Other Ambulatory Visit: Payer: Self-pay | Admitting: Gastroenterology

## 2021-01-31 NOTE — Therapy (Signed)
OUTPATIENT PHYSICAL THERAPY TREATMENT NOTE   Patient Name: Caitlin [name] MRN: [id number] DOB:[birth year], 76 y.o., female Today's Date: 02/01/2021  PCP: Burnis Medin, MD REFERRING PROVIDER: Cameron Sprang, MD   PT End of Session - 02/01/21 1440     Visit Number 3    Number of Visits 7    Date for PT Re-Evaluation 03/05/21    Authorization Type [employer] MCR    PT Start Time 2841    PT Stop Time 1526    PT Time Calculation (min) 41 min    Activity Tolerance Patient tolerated treatment well    Behavior During Therapy [facility] for tasks assessed/performed              Past Medical History:  Diagnosis Date   Allergic rhinitis 07/11/2006   Back pain 10/09/2007   Blood transfusion without reported diagnosis    Diaphragmatic hernia 07/11/2006   Diverticulosis of colon    Eczema 07/11/2006   Elevated cholesterol 04/29/2012   Enteritis    Erythema nodosum    Tends to be seasonal has had a negative chest x-ray in the past negative PPD   Essential hypertension 07/11/2006   Fatigue 03/12/2007   Fibromyalgia    Gastroparesis 12/23/2008   Generalized anxiety disorder 09/01/2014   GERD (gastroesophageal reflux disease)    History of stricture and dilatation and Nissen surgery   Hearing loss, left ear 11/27/2008   History of cataract    bilateral; s/p surgery   Hyperglycemia 08/15/2006   Irritable bowel syndrome 12/23/2008   Leg cramps, nocturnal 11/11/2007   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   MCI (mild cognitive impairment) 10/22/2018   Obstructive sleep apnea 04/23/2007   uses CPAP nightly; mask has leak   Osteoarthritis 11/28/2011   Osteopenia    Prediabetes 04/19/2011   Vitamin B12 deficiency 05/13/2010   Past Surgical History:  Procedure Laterality Date   ABDOMINAL HYSTERECTOMY     bone spur     CATARACT EXTRACTION W/ INTRAOCULAR LENS  IMPLANT, BILATERAL     CESAREAN SECTION     x 1   CHOLECYSTECTOMY     COLONOSCOPY     DENTAL SURGERY      ESOPHAGOGASTRODUODENOSCOPY     stricture and dilatation   left shoulder surgery  06/08, 07/12/17   x 2   MOUTH SURGERY Right 32/44/0102   NISSEN FUNDOPLICATION     ROTATOR CUFF REPAIR Left    rt knee surgery     scope   UPPER GASTROINTESTINAL ENDOSCOPY     Patient Active Problem List   Diagnosis Date Noted   MCI (mild cognitive impairment) 10/22/2018   Generalized anxiety disorder 09/01/2014   Hair loss 08/12/2013   Erythema nodosum 06/05/2012   Elevated cholesterol 04/29/2012   Osteoarthritis 11/28/2011   GERD (gastroesophageal reflux disease)    Prediabetes 04/19/2011   Hypotension 06/07/2010   Vitamin B12 deficiency 05/13/2010   Gastroparesis 12/23/2008   Irritable bowel syndrome 12/23/2008   Hearing loss, left ear 11/27/2008   Leg cramps, nocturnal 11/11/2007   Back pain 10/09/2007   Obstructive sleep apnea 04/23/2007   Fatigue 03/12/2007   Diverticulosis of colon 12/10/2006   Fibromyalgia 12/10/2006   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   Hyperglycemia 08/15/2006   Essential hypertension 07/11/2006   Allergic rhinitis 07/11/2006   Diaphragmatic hernia 07/11/2006   Eczema 07/11/2006    REFERRING DIAG: Diagnosis G31.84 (ICD-10-CM) - MCI (mild cognitive impairment) F33.2 (ICD-10-CM) - Severe episode of recurrent major depressive disorder, without psychotic features (  [place]) F41.1 (ICD-10-CM) - Generalized anxiety disorder M54.32 (ICD-10-CM) - Left sciatic nerve pain   THERAPY DIAG:  Chronic bilateral low back pain with left-sided sciatica  Muscle weakness (generalized)  PERTINENT HISTORY: Mild cognitive impairment, osteopenia, fibromyalgia  PRECAUTIONS: none  SUBJECTIVE: Patient reports no pain in the back or hip currently. She reports compliance with HEP, but feels a pulling in the thigh/hip muscles with all her exercises. She reports having a fall in the evening after last session where she was walking outside and fell backwards. She went to the ED due to hitting  her head, but imaging was unremarkable. She reports no numbness in the thigh since last session.  PAIN:  Are you having pain? No VAS scale: 0/10 Pain location: N/A Pain orientation: N/A  PAIN TYPE: N/A Pain description: N/A  Aggravating factors: N/A Relieving factors: N/A      OBJECTIVE:   Unless otherwise noted all objective measures were obtained on initial evaluation.  DIAGNOSTIC FINDINGS:  N/A   PATIENT SURVEYS:  01/24/21 FOTO 50% function to 57% predicted    SCREENING FOR RED FLAGS: Bowel or bladder incontinence: No Cauda equina syndrome: No     COGNITION:          Overall cognitive status: PMH of mild cognitive impairment                    SENSATION:          Light touch: Appears intact           MUSCLE LENGTH: Hamstrings: moderate tightness bilaterally Quadriceps: moderate tightness bilaterally    POSTURE:  Decreased lordosis  02/01/21: able to self-correct posture to adjust when reverts to slump posture    PALPATION: Tautness and palpable tenderness bilateral lumbar paraspinals    LUMBARAROM/PROM   A/PROM A/PROM  01/19/2021  Flexion Fingertips 8 inches from floor  Extension 75% limited   Right lateral flexion WNL  Left lateral flexion WNL  Right rotation WNL  Left rotation WNL   (Blank rows = not tested)   LE AROM/PROM:   A/PROM Right 01/19/2021 Left 01/19/2021  Hip flexion      Hip extension      Hip abduction      Hip adduction      Hip internal rotation      Hip external rotation      Knee flexion      Knee extension      Ankle dorsiflexion      Ankle plantarflexion      Ankle inversion      Ankle eversion       (Blank rows = not tested)   LE MMT:   MMT Right 01/19/2021 Left 01/19/2021  Hip flexion 4+ 4- (pain in low back)  Hip extension 4- 4-  Hip abduction 4+ 4+  Knee flexion 4+ 4+  Knee extension 5 5  Ankle dorsiflexion 5 5  Ankle plantarflexion 5 5  Ankle inversion      Ankle eversion 5 5   (Blank rows = not tested)    LUMBAR SPECIAL TESTS:  SLR: (+) bilaterally    FUNCTIONAL TESTS:  5 times sit to stand: 12 seconds  SLS<3 seconds bilaterally, hesitant to perform    GAIT: Not assessed        TODAY'S TREATMENT  [facility] Adult PT Treatment:  DATE: 02/01/21 Therapeutic Exercise: Nustep level 5 x 5 minutes Sidelying hip abduction 2 x 10 bilateral  Sit to stand 2 x 10  Standing march 2 x 10  Standing calf raise 2 x 10  Standing hip extension 2 x 10  Standing hip abduction 2 x 10    [facility] Adult PT Treatment:                                                DATE: 01/24/21 Therapeutic Exercise: Seated hamstring stretch 30 sec  Hip bridge 2 x 10  Supine pelvic tilts 2 x 10  Supine hip flexor stretch 30 sec  Resisted hip abduction 2 x 10 green band  Updated HEP  Manual Therapy: N/A  Self Care: Education on FOTO score as it relates to overall function Posture education discussing appropriate sitting and standing posture   TREATMENT 01/19/21   Therapeutic Exercise: - Demonstrated and issued HEP.    Manual Therapy: - N/A   Neuromuscular re-ed: - N/A   Therapeutic Activity: - Education on assessment findings and POC.    Self-care/Home Management: - N/A       PATIENT EDUCATION:  Education details: N/A Person educated: N/A Education method: N/A Education comprehension: N/A     HOME EXERCISE PROGRAM: Access Code: [email or web address]   ASSESSMENT:   CLINICAL IMPRESSION:  Patient is making steady progress in PT despite her reports of a recent fall that occurred when walking outside last week. She reports no paraesthesia about the anterior thigh since last session indicative of overall improvements in her condition. She demonstrates improved postural awareness with ability to self-correct her seated posture having met this short term goal. She demonstrates good control with sit to stand, though quickly fatigues with this strengthening exercise. With  standing strengthening patient reports feeling weaker in the Rt hip. Overall good tolerance to today's session without reports of pain, though requires frequent seated rest breaks secondary to fatigue.   REHAB POTENTIAL: Good   CLINICAL DECISION MAKING: Stable/uncomplicated   EVALUATION COMPLEXITY: Low     GOALS:   SHORT TERM GOALS:   STG Name Target Date Goal status  1 Patient will be independent and compliant with initial HEP.    Baseline:  02/02/2021 ACHIEVED  2 Patient will demonstrate knowledge and application of appropriate sitting posture to reduce stress on her low back with prolonged sitting.  Baseline:  02/09/2021 ACHIEVED    LONG TERM GOALS:    LTG Name Target Date Goal status  1 Patient will improve lumbar extension by at least 25% to improve ability complete reaching activity Baseline: 75% limited  03/02/2021 INITIAL  2 Patient will maintain SLS for at least 5 seconds to improve her stability with entering/exiting her bathtub Baseline: 03/02/2021 INITIAL  3 Patient will demonstrate at least 4+/5 Lt hip extensor and abductor strength to improve overall lumbopelvic stability.  Baseline:see objective 03/02/2021 INITIAL  4 Patient will be independent in advanced home program to assist in management of her chronic condition.  Baseline: 03/02/2021 INITIAL    PLAN: PT FREQUENCY: 1x/week   PT DURATION: 6 weeks   PLANNED INTERVENTIONS: Therapeutic exercises, Therapeutic activity, Neuro Muscular re-education, Balance training, Gait training, Patient/Family education, Joint mobilization, Stair training, Dry Needling, Cryotherapy, Moist heat, Taping, and Manual therapy   PLAN FOR NEXT SESSION:  review HEP, core and hip strengthening, balance training.  Gwendolyn Grant, PT, DPT, ATC 02/01/21 3:26 PM

## 2021-02-01 ENCOUNTER — Ambulatory Visit: Payer: Medicare Other

## 2021-02-01 ENCOUNTER — Other Ambulatory Visit: Payer: Self-pay

## 2021-02-01 DIAGNOSIS — M5442 Lumbago with sciatica, left side: Secondary | ICD-10-CM | POA: Diagnosis not present

## 2021-02-01 DIAGNOSIS — G8929 Other chronic pain: Secondary | ICD-10-CM

## 2021-02-01 DIAGNOSIS — M6281 Muscle weakness (generalized): Secondary | ICD-10-CM

## 2021-02-09 NOTE — Therapy (Signed)
OUTPATIENT PHYSICAL THERAPY TREATMENT NOTE   Patient Name: Caitlin [name] MRN: [id number] DOB:11/15/1945, 76 y.o., female Today's Date: 02/10/2021  PCP: Burnis Medin, MD REFERRING PROVIDER: Burnis Medin, MD   PT End of Session - 02/10/21 1446     Visit Number 4    Number of Visits 7    Date for PT Re-Evaluation 03/05/21    Authorization Type [employer] MCR    PT Start Time 4193    PT Stop Time 7902    PT Time Calculation (min) 43 min    Activity Tolerance Patient tolerated treatment well    Behavior During Therapy [facility] for tasks assessed/performed               Past Medical History:  Diagnosis Date   Allergic rhinitis 07/11/2006   Back pain 10/09/2007   Blood transfusion without reported diagnosis    Diaphragmatic hernia 07/11/2006   Diverticulosis of colon    Eczema 07/11/2006   Elevated cholesterol 04/29/2012   Enteritis    Erythema nodosum    Tends to be seasonal has had a negative chest x-ray in the past negative PPD   Essential hypertension 07/11/2006   Fatigue 03/12/2007   Fibromyalgia    Gastroparesis 12/23/2008   Generalized anxiety disorder 09/01/2014   GERD (gastroesophageal reflux disease)    History of stricture and dilatation and Nissen surgery   Hearing loss, left ear 11/27/2008   History of cataract    bilateral; s/p surgery   Hyperglycemia 08/15/2006   Irritable bowel syndrome 12/23/2008   Leg cramps, nocturnal 11/11/2007   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   MCI (mild cognitive impairment) 10/22/2018   Obstructive sleep apnea 04/23/2007   uses CPAP nightly; mask has leak   Osteoarthritis 11/28/2011   Osteopenia    Prediabetes 04/19/2011   Vitamin B12 deficiency 05/13/2010   Past Surgical History:  Procedure Laterality Date   ABDOMINAL HYSTERECTOMY     bone spur     CATARACT EXTRACTION W/ INTRAOCULAR LENS  IMPLANT, BILATERAL     CESAREAN SECTION     x 1   CHOLECYSTECTOMY     COLONOSCOPY     DENTAL SURGERY      ESOPHAGOGASTRODUODENOSCOPY     stricture and dilatation   left shoulder surgery  06/08, 07/12/17   x 2   MOUTH SURGERY Right 40/97/3532   NISSEN FUNDOPLICATION     ROTATOR CUFF REPAIR Left    rt knee surgery     scope   UPPER GASTROINTESTINAL ENDOSCOPY     Patient Active Problem List   Diagnosis Date Noted   MCI (mild cognitive impairment) 10/22/2018   Generalized anxiety disorder 09/01/2014   Hair loss 08/12/2013   Erythema nodosum 06/05/2012   Elevated cholesterol 04/29/2012   Osteoarthritis 11/28/2011   GERD (gastroesophageal reflux disease)    Prediabetes 04/19/2011   Hypotension 06/07/2010   Vitamin B12 deficiency 05/13/2010   Gastroparesis 12/23/2008   Irritable bowel syndrome 12/23/2008   Hearing loss, left ear 11/27/2008   Leg cramps, nocturnal 11/11/2007   Back pain 10/09/2007   Obstructive sleep apnea 04/23/2007   Fatigue 03/12/2007   Diverticulosis of colon 12/10/2006   Fibromyalgia 12/10/2006   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   Hyperglycemia 08/15/2006   Essential hypertension 07/11/2006   Allergic rhinitis 07/11/2006   Diaphragmatic hernia 07/11/2006   Eczema 07/11/2006    REFERRING DIAG: Diagnosis G31.84 (ICD-10-CM) - MCI (mild cognitive impairment) F33.2 (ICD-10-CM) - Severe episode of recurrent major depressive disorder, without psychotic  features ([place]) F41.1 (ICD-10-CM) - Generalized anxiety disorder M54.32 (ICD-10-CM) - Left sciatic nerve pain   THERAPY DIAG:  Chronic bilateral low back pain with left-sided sciatica  Muscle weakness (generalized)  PERTINENT HISTORY: Mild cognitive impairment, osteopenia, fibromyalgia  PRECAUTIONS: none  SUBJECTIVE:  Patient reports the back "is a little rough right now." She denies any falls since last session. She reports compliance with HEP. Denies any Lt leg numbness since last visit.   PAIN:  Are you having pain? Yes VAS scale: 2/10 Pain location: low back  PAIN TYPE: chronic  Pain description: dull   Aggravating factors: vacuuming  Relieving factors: rest      OBJECTIVE:   Unless otherwise noted all objective measures were obtained on initial evaluation.  DIAGNOSTIC FINDINGS:  N/A   PATIENT SURVEYS:  01/24/21 FOTO 50% function to 57% predicted    SCREENING FOR RED FLAGS: Bowel or bladder incontinence: No Cauda equina syndrome: No     COGNITION:          Overall cognitive status: PMH of mild cognitive impairment                    SENSATION:          Light touch: Appears intact           MUSCLE LENGTH: Hamstrings: moderate tightness bilaterally Quadriceps: moderate tightness bilaterally    POSTURE:  Decreased lordosis  02/01/21: able to self-correct posture to adjust when reverts to slump posture    PALPATION: Tautness and palpable tenderness bilateral lumbar paraspinals    LUMBARAROM/PROM   A/PROM A/PROM  01/19/2021  Flexion Fingertips 8 inches from floor  Extension 75% limited   Right lateral flexion WNL  Left lateral flexion WNL  Right rotation WNL  Left rotation WNL   (Blank rows = not tested)   LE AROM/PROM:   A/PROM Right 01/19/2021 Left 01/19/2021  Hip flexion      Hip extension      Hip abduction      Hip adduction      Hip internal rotation      Hip external rotation      Knee flexion      Knee extension      Ankle dorsiflexion      Ankle plantarflexion      Ankle inversion      Ankle eversion       (Blank rows = not tested)   LE MMT:   MMT Right 01/19/2021 Left 01/19/2021 02/10/21  Hip flexion 4+ 4- (pain in low back) 4+/5 bilaterally  Hip extension 4- 4-   Hip abduction 4+ 4+   Knee flexion 4+ 4+   Knee extension 5 5   Ankle dorsiflexion 5 5   Ankle plantarflexion 5 5   Ankle inversion       Ankle eversion 5 5    (Blank rows = not tested)   LUMBAR SPECIAL TESTS:  SLR: (+) bilaterally    FUNCTIONAL TESTS:  5 times sit to stand: 12 seconds  SLS<3 seconds bilaterally, hesitant to perform    GAIT: Not assessed        TODAY'S  TREATMENT    [facility] Adult PT Treatment:                                                DATE: 02/10/21 Therapeutic  Exercise: NuStep level 5 x 5 minutes  Step ups 4 inch step 2 x 10 bilateral  Lateral step overs hurdle 2 x 10  Heel/toe rocks 2 x 10  Standing resisted hip abduction red band 2 x 10  Standing resisted hip extension red band 2 x 10  Seated LAQ 2 x 10; 2 lbs bilateral  Sit to stand 5 lbs 2 x 10  Updated HEP    [facility] Adult PT Treatment:                                                DATE: 02/01/21 Therapeutic Exercise: Nustep level 5 x 5 minutes Sidelying hip abduction 2 x 10 bilateral  Sit to stand 2 x 10  Standing march 2 x 10  Standing calf raise 2 x 10  Standing hip extension 2 x 10  Standing hip abduction 2 x 10    [facility] Adult PT Treatment:                                                DATE: 01/24/21 Therapeutic Exercise: Seated hamstring stretch 30 sec  Hip bridge 2 x 10  Supine pelvic tilts 2 x 10  Supine hip flexor stretch 30 sec  Resisted hip abduction 2 x 10 green band  Updated HEP  Manual Therapy: N/A  Self Care: Education on FOTO score as it relates to overall function Posture education discussing appropriate sitting and standing posture   TREATMENT 01/19/21   Therapeutic Exercise: - Demonstrated and issued HEP.    Manual Therapy: - N/A   Neuromuscular re-ed: - N/A   Therapeutic Activity: - Education on assessment findings and POC.    Self-care/Home Management: - N/A       PATIENT EDUCATION:  Education details: see treatment  Person educated: patient  Education method: demonstration, Dealer comprehension: returned demo, verbal cues     HOME EXERCISE PROGRAM: Access Code: [email or web address]   ASSESSMENT:   CLINICAL IMPRESSION:   Patient tolerated session well today with continued progression of standing strengthening. She is initially hesitant to perform step ups without UE support, though with encouragement she is able to  perform without need for UE support and no LOB. She has limited standing endurance requiring seated rest breaks between sets with majority of standing activity. No reports of increased back pain throughout session. Her Lt hip flexion strength has improved compared to initial evaluation without eliciting low back pain during MMT.   REHAB POTENTIAL: Good   CLINICAL DECISION MAKING: Stable/uncomplicated   EVALUATION COMPLEXITY: Low     GOALS:   SHORT TERM GOALS:   STG Name Target Date Goal status  1 Patient will be independent and compliant with initial HEP.    Baseline:  02/02/2021 ACHIEVED  2 Patient will demonstrate knowledge and application of appropriate sitting posture to reduce stress on her low back with prolonged sitting.  Baseline:  02/09/2021 ACHIEVED    LONG TERM GOALS:    LTG Name Target Date Goal status  1 Patient will improve lumbar extension by at least 25% to improve ability complete reaching activity Baseline: 75% limited  03/02/2021 INITIAL  2 Patient will maintain SLS for at least 5 seconds to improve  her stability with entering/exiting her bathtub Baseline: 03/02/2021 INITIAL  3 Patient will demonstrate at least 4+/5 Lt hip extensor and abductor strength to improve overall lumbopelvic stability.  Baseline:see objective 03/02/2021 INITIAL  4 Patient will be independent in advanced home program to assist in management of her chronic condition.  Baseline: 03/02/2021 INITIAL    PLAN: PT FREQUENCY: 1x/week   PT DURATION: 6 weeks   PLANNED INTERVENTIONS: Therapeutic exercises, Therapeutic activity, Neuro Muscular re-education, Balance training, Gait training, Patient/Family education, Joint mobilization, Stair training, Dry Needling, Cryotherapy, Moist heat, Taping, and Manual therapy   PLAN FOR NEXT SESSION:  review HEP, core and hip strengthening, balance training.   Gwendolyn Grant, PT, DPT, ATC 02/10/21 3:29 PM

## 2021-02-10 ENCOUNTER — Ambulatory Visit: Payer: Medicare Other

## 2021-02-10 ENCOUNTER — Other Ambulatory Visit: Payer: Self-pay

## 2021-02-10 DIAGNOSIS — M5442 Lumbago with sciatica, left side: Secondary | ICD-10-CM | POA: Diagnosis not present

## 2021-02-10 DIAGNOSIS — M6281 Muscle weakness (generalized): Secondary | ICD-10-CM

## 2021-02-10 DIAGNOSIS — G8929 Other chronic pain: Secondary | ICD-10-CM

## 2021-02-16 NOTE — Therapy (Signed)
OUTPATIENT PHYSICAL THERAPY TREATMENT NOTE   Patient Name: Caitlin [name] MRN: [id number] DOB:14-Mar-1945, 76 y.o., female Today's Date: 02/21/2021  PCP: Burnis Medin, MD REFERRING PROVIDER: Burnis Medin, MD   PT End of Session - 02/21/21 1412     Visit Number 5    Number of Visits 7    Date for PT Re-Evaluation 03/05/21    Authorization Type [employer] MCR    PT Start Time 1308    PT Stop Time 6578    PT Time Calculation (min) 43 min    Activity Tolerance Patient tolerated treatment well    Behavior During Therapy [facility] for tasks assessed/performed                Past Medical History:  Diagnosis Date   Allergic rhinitis 07/11/2006   Back pain 10/09/2007   Blood transfusion without reported diagnosis    Diaphragmatic hernia 07/11/2006   Diverticulosis of colon    Eczema 07/11/2006   Elevated cholesterol 04/29/2012   Enteritis    Erythema nodosum    Tends to be seasonal has had a negative chest x-ray in the past negative PPD   Essential hypertension 07/11/2006   Fatigue 03/12/2007   Fibromyalgia    Gastroparesis 12/23/2008   Generalized anxiety disorder 09/01/2014   GERD (gastroesophageal reflux disease)    History of stricture and dilatation and Nissen surgery   Hearing loss, left ear 11/27/2008   History of cataract    bilateral; s/p surgery   Hyperglycemia 08/15/2006   Irritable bowel syndrome 12/23/2008   Leg cramps, nocturnal 11/11/2007   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   MCI (mild cognitive impairment) 10/22/2018   Obstructive sleep apnea 04/23/2007   uses CPAP nightly; mask has leak   Osteoarthritis 11/28/2011   Osteopenia    Prediabetes 04/19/2011   Vitamin B12 deficiency 05/13/2010   Past Surgical History:  Procedure Laterality Date   ABDOMINAL HYSTERECTOMY     bone spur     CATARACT EXTRACTION W/ INTRAOCULAR LENS  IMPLANT, BILATERAL     CESAREAN SECTION     x 1   CHOLECYSTECTOMY     COLONOSCOPY     DENTAL SURGERY      ESOPHAGOGASTRODUODENOSCOPY     stricture and dilatation   left shoulder surgery  06/08, 07/12/17   x 2   MOUTH SURGERY Right 46/96/2952   NISSEN FUNDOPLICATION     ROTATOR CUFF REPAIR Left    rt knee surgery     scope   UPPER GASTROINTESTINAL ENDOSCOPY     Patient Active Problem List   Diagnosis Date Noted   MCI (mild cognitive impairment) 10/22/2018   Generalized anxiety disorder 09/01/2014   Hair loss 08/12/2013   Erythema nodosum 06/05/2012   Elevated cholesterol 04/29/2012   Osteoarthritis 11/28/2011   GERD (gastroesophageal reflux disease)    Prediabetes 04/19/2011   Hypotension 06/07/2010   Vitamin B12 deficiency 05/13/2010   Gastroparesis 12/23/2008   Irritable bowel syndrome 12/23/2008   Hearing loss, left ear 11/27/2008   Leg cramps, nocturnal 11/11/2007   Back pain 10/09/2007   Obstructive sleep apnea 04/23/2007   Fatigue 03/12/2007   Diverticulosis of colon 12/10/2006   Fibromyalgia 12/10/2006   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   Hyperglycemia 08/15/2006   Essential hypertension 07/11/2006   Allergic rhinitis 07/11/2006   Diaphragmatic hernia 07/11/2006   Eczema 07/11/2006    REFERRING DIAG: Diagnosis G31.84 (ICD-10-CM) - MCI (mild cognitive impairment) F33.2 (ICD-10-CM) - Severe episode of recurrent major depressive disorder, without  psychotic features ([place]) F41.1 (ICD-10-CM) - Generalized anxiety disorder M54.32 (ICD-10-CM) - Left sciatic nerve pain   THERAPY DIAG:  Chronic bilateral low back pain with left-sided sciatica  Muscle weakness (generalized)  PERTINENT HISTORY: Mild cognitive impairment, osteopenia, fibromyalgia  PRECAUTIONS: none  SUBJECTIVE:  Patient reports the back is ok. She reports compliance with HEP.   PAIN:  Are you having pain? No VAS scale: 0/10 Pain location: low back  PAIN TYPE: chronic  Pain description: dull  Aggravating factors: vacuuming  Relieving factors: rest      OBJECTIVE:   Unless otherwise noted all  objective measures were obtained on initial evaluation.  DIAGNOSTIC FINDINGS:  N/A   PATIENT SURVEYS:  01/24/21 FOTO 50% function to 57% predicted    SCREENING FOR RED FLAGS: Bowel or bladder incontinence: No Cauda equina syndrome: No     COGNITION:          Overall cognitive status: PMH of mild cognitive impairment                    SENSATION:          Light touch: Appears intact           MUSCLE LENGTH: Hamstrings: moderate tightness bilaterally Quadriceps: moderate tightness bilaterally    POSTURE:  Decreased lordosis  02/01/21: able to self-correct posture to adjust when reverts to slump posture    PALPATION: Tautness and palpable tenderness bilateral lumbar paraspinals    LUMBARAROM/PROM   A/PROM A/PROM  01/19/2021  Flexion Fingertips 8 inches from floor  Extension 75% limited   Right lateral flexion WNL  Left lateral flexion WNL  Right rotation WNL  Left rotation WNL   (Blank rows = not tested)   LE AROM/PROM:   A/PROM Right 01/19/2021 Left 01/19/2021  Hip flexion      Hip extension      Hip abduction      Hip adduction      Hip internal rotation      Hip external rotation      Knee flexion      Knee extension      Ankle dorsiflexion      Ankle plantarflexion      Ankle inversion      Ankle eversion       (Blank rows = not tested)   LE MMT:   MMT Right 01/19/2021 Left 01/19/2021 02/10/21  Hip flexion 4+ 4- (pain in low back) 4+/5 bilaterally  Hip extension 4- 4-   Hip abduction 4+ 4+   Knee flexion 4+ 4+   Knee extension 5 5   Ankle dorsiflexion 5 5   Ankle plantarflexion 5 5   Ankle inversion       Ankle eversion 5 5    (Blank rows = not tested)   LUMBAR SPECIAL TESTS:  SLR: (+) bilaterally    FUNCTIONAL TESTS:  5 times sit to stand: 12 seconds  SLS<3 seconds bilaterally, hesitant to perform; SLS 7 seconds bilateral    GAIT: Not assessed        TODAY'S TREATMENT   [facility] Adult PT Treatment:                                                 DATE: 02/21/21 Therapeutic Exercise: Bike x 5 minutes, level 2  Leg press 2 x 10; 20 lbs  Step taps 8 inch 2 x 10 bilateral no UE support Lateral hurdle step overs 2 x 10 occasional UE support  Forward hurdle step overs 2 x 10 occasional UE support  Sit to stand 5 lb kettlebell 1 x 10      [facility] Adult PT Treatment:                                                DATE: 02/10/21 Therapeutic Exercise: NuStep level 5 x 5 minutes  Step ups 4 inch step 2 x 10 bilateral  Lateral step overs hurdle 2 x 10  Heel/toe rocks 2 x 10  Standing resisted hip abduction red band 2 x 10  Standing resisted hip extension red band 2 x 10  Seated LAQ 2 x 10; 2 lbs bilateral  Sit to stand 5 lbs 2 x 10  Updated HEP    [facility] Adult PT Treatment:                                                DATE: 02/01/21 Therapeutic Exercise: Nustep level 5 x 5 minutes Sidelying hip abduction 2 x 10 bilateral  Sit to stand 2 x 10  Standing march 2 x 10  Standing calf raise 2 x 10  Standing hip extension 2 x 10  Standing hip abduction 2 x 10    [facility] Adult PT Treatment:                                                DATE: 01/24/21 Therapeutic Exercise: Seated hamstring stretch 30 sec  Hip bridge 2 x 10  Supine pelvic tilts 2 x 10  Supine hip flexor stretch 30 sec  Resisted hip abduction 2 x 10 green band  Updated HEP  Manual Therapy: N/A  Self Care: Education on FOTO score as it relates to overall function Posture education discussing appropriate sitting and standing posture   TREATMENT 01/19/21   Therapeutic Exercise: - Demonstrated and issued HEP.    Manual Therapy: - N/A   Neuromuscular re-ed: - N/A   Therapeutic Activity: - Education on assessment findings and POC.    Self-care/Home Management: - N/A       PATIENT EDUCATION:  Education details: n/a Person educated: n/a  Education method: n/a Education comprehension: n/a     HOME EXERCISE PROGRAM: Access Code: [email or web address]   ASSESSMENT:    CLINICAL IMPRESSION:    Patient arrives without reports of back pain. She demonstrates improvements in SL balance since the start of care having met this long term functional goal. Continued with LE strength and balance, which she tolerated well without reports of pain. Able to utilize leg press and recumbent bike today at patient's request as she is interested in utilizing this equipment at her neighborhood gym. She was able to complete both without pain, though quickly fatigues with bike secondary to decreased aerobic endurance. Her standing tolerance and stability continues to improve with patient requiring intermittent UE support with stepping activity.   REHAB POTENTIAL: Good   CLINICAL DECISION MAKING: Stable/uncomplicated   EVALUATION COMPLEXITY: Low  GOALS:   SHORT TERM GOALS:   STG Name Target Date Goal status  1 Patient will be independent and compliant with initial HEP.    Baseline:  02/02/2021 ACHIEVED  2 Patient will demonstrate knowledge and application of appropriate sitting posture to reduce stress on her low back with prolonged sitting.  Baseline:  02/09/2021 ACHIEVED    LONG TERM GOALS:    LTG Name Target Date Goal status  1 Patient will improve lumbar extension by at least 25% to improve ability complete reaching activity Baseline: 75% limited  03/02/2021 INITIAL  2 Patient will maintain SLS for at least 5 seconds to improve her stability with entering/exiting her bathtub Baseline: 03/02/2021 Achieved   3 Patient will demonstrate at least 4+/5 Lt hip extensor and abductor strength to improve overall lumbopelvic stability.  Baseline:see objective 03/02/2021 INITIAL  4 Patient will be independent in advanced home program to assist in management of her chronic condition.  Baseline: 03/02/2021 INITIAL    PLAN: PT FREQUENCY: 1x/week   PT DURATION: 6 weeks   PLANNED INTERVENTIONS: Therapeutic exercises, Therapeutic activity, Neuro Muscular re-education, Balance  training, Gait training, Patient/Family education, Joint mobilization, Stair training, Dry Needling, Cryotherapy, Moist heat, Taping, and Manual therapy   PLAN FOR NEXT SESSION:  review HEP, core and hip strengthening, balance training.   Gwendolyn Grant, PT, DPT, ATC 02/21/21 2:59 PM

## 2021-02-21 ENCOUNTER — Other Ambulatory Visit: Payer: Self-pay

## 2021-02-21 ENCOUNTER — Ambulatory Visit: Payer: Medicare Other | Attending: Neurology

## 2021-02-21 DIAGNOSIS — M6281 Muscle weakness (generalized): Secondary | ICD-10-CM | POA: Insufficient documentation

## 2021-02-21 DIAGNOSIS — G8929 Other chronic pain: Secondary | ICD-10-CM | POA: Diagnosis present

## 2021-02-21 DIAGNOSIS — M5442 Lumbago with sciatica, left side: Secondary | ICD-10-CM | POA: Diagnosis not present

## 2021-03-01 ENCOUNTER — Other Ambulatory Visit: Payer: Self-pay

## 2021-03-01 ENCOUNTER — Ambulatory Visit: Payer: Medicare Other

## 2021-03-01 DIAGNOSIS — G8929 Other chronic pain: Secondary | ICD-10-CM

## 2021-03-01 DIAGNOSIS — M5442 Lumbago with sciatica, left side: Secondary | ICD-10-CM | POA: Diagnosis not present

## 2021-03-01 DIAGNOSIS — M6281 Muscle weakness (generalized): Secondary | ICD-10-CM

## 2021-03-01 NOTE — Therapy (Signed)
OUTPATIENT PHYSICAL THERAPY TREATMENT NOTE  PHYSICAL THERAPY DISCHARGE SUMMARY  Visits from Start of Care: 6  Current functional level related to goals / functional outcomes: See goals above   Remaining deficits: See impression   Education / Equipment: See treatment    Patient agrees to discharge. Patient goals were met. Patient is being discharged due to meeting the stated rehab goals.    Patient Name: Caitlin [name] MRN: [id number] DOB:01-10-1946, 76 y.o., female Today's Date: 03/01/2021  PCP: Burnis Medin, MD REFERRING PROVIDER: Burnis Medin, MD   PT End of Session - 03/01/21 1319     Visit Number 6    Number of Visits 7    Date for PT Re-Evaluation 03/05/21    Authorization Type U[employer] MCR    PT Start Time 1316    PT Stop Time 7672    PT Time Calculation (min) 31 min    Activity Tolerance Patient tolerated treatment well    Behavior During Therapy [facility] for tasks assessed/performed                 Past Medical History:  Diagnosis Date   Allergic rhinitis 07/11/2006   Back pain 10/09/2007   Blood transfusion without reported diagnosis    Diaphragmatic hernia 07/11/2006   Diverticulosis of colon    Eczema 07/11/2006   Elevated cholesterol 04/29/2012   Enteritis    Erythema nodosum    Tends to be seasonal has had a negative chest x-ray in the past negative PPD   Essential hypertension 07/11/2006   Fatigue 03/12/2007   Fibromyalgia    Gastroparesis 12/23/2008   Generalized anxiety disorder 09/01/2014   GERD (gastroesophageal reflux disease)    History of stricture and dilatation and Nissen surgery   Hearing loss, left ear 11/27/2008   History of cataract    bilateral; s/p surgery   Hyperglycemia 08/15/2006   Irritable bowel syndrome 12/23/2008   Leg cramps, nocturnal 11/11/2007   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   MCI (mild cognitive impairment) 10/22/2018   Obstructive sleep apnea 04/23/2007   uses CPAP nightly; mask has leak    Osteoarthritis 11/28/2011   Osteopenia    Prediabetes 04/19/2011   Vitamin B12 deficiency 05/13/2010   Past Surgical History:  Procedure Laterality Date   ABDOMINAL HYSTERECTOMY     bone spur     CATARACT EXTRACTION W/ INTRAOCULAR LENS  IMPLANT, BILATERAL     CESAREAN SECTION     x 1   CHOLECYSTECTOMY     COLONOSCOPY     DENTAL SURGERY     ESOPHAGOGASTRODUODENOSCOPY     stricture and dilatation   left shoulder surgery  06/08, 07/12/17   x 2   MOUTH SURGERY Right 09/47/0962   NISSEN FUNDOPLICATION     ROTATOR CUFF REPAIR Left    rt knee surgery     scope   UPPER GASTROINTESTINAL ENDOSCOPY     Patient Active Problem List   Diagnosis Date Noted   MCI (mild cognitive impairment) 10/22/2018   Generalized anxiety disorder 09/01/2014   Hair loss 08/12/2013   Erythema nodosum 06/05/2012   Elevated cholesterol 04/29/2012   Osteoarthritis 11/28/2011   GERD (gastroesophageal reflux disease)    Prediabetes 04/19/2011   Hypotension 06/07/2010   Vitamin B12 deficiency 05/13/2010   Gastroparesis 12/23/2008   Irritable bowel syndrome 12/23/2008   Hearing loss, left ear 11/27/2008   Leg cramps, nocturnal 11/11/2007   Back pain 10/09/2007   Obstructive sleep apnea 04/23/2007   Fatigue 03/12/2007  Diverticulosis of colon 12/10/2006   Fibromyalgia 12/10/2006   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   Hyperglycemia 08/15/2006   Essential hypertension 07/11/2006   Allergic rhinitis 07/11/2006   Diaphragmatic hernia 07/11/2006   Eczema 07/11/2006    REFERRING DIAG: Diagnosis G31.84 (ICD-10-CM) - MCI (mild cognitive impairment) F33.2 (ICD-10-CM) - Severe episode of recurrent major depressive disorder, without psychotic features ([place]) F41.1 (ICD-10-CM) - Generalized anxiety disorder M54.32 (ICD-10-CM) - Left sciatic nerve pain   THERAPY DIAG:  Chronic bilateral low back pain with left-sided sciatica  Muscle weakness (generalized)  PERTINENT HISTORY: Mild cognitive impairment,  osteopenia, fibromyalgia  PRECAUTIONS: none  SUBJECTIVE:  Patient reports the back is feeling better. She reports occasional pain in the back and numbness along the Lt posterior leg with prolonged standing/walking. Patient reports subjective overall improvement of 70% reporting improvement in her strength. She denies any falls since last session.   PAIN:  Are you having pain? No NPRS scale: 0/10 Pain location: low back  PAIN TYPE: chronic  Pain description: dull  Aggravating factors: vacuuming  Relieving factors: rest      OBJECTIVE:   Unless otherwise noted all objective measures were obtained on initial evaluation.     PATIENT SURVEYS:  01/24/21 FOTO 50% function to 57% predicted  03/01/21 56% function       COGNITION:          Overall cognitive status: PMH of mild cognitive impairment                              MUSCLE LENGTH: Hamstrings: moderate tightness bilaterally Quadriceps: moderate tightness bilaterally    POSTURE:  Decreased lordosis  02/01/21: able to self-correct posture to adjust when reverts to slump posture    PALPATION: Tautness and palpable tenderness bilateral lumbar paraspinals    LUMBARAROM/PROM   A/PROM A/PROM  01/19/2021 03/01/21  Flexion Fingertips 8 inches from floor Fingertips 7 inches from floor   Extension 75% limited  50% limited  Right lateral flexion WNL WNL  Left lateral flexion WNL WNL  Right rotation WNL WNL  Left rotation WNL WNL   (Blank rows = not tested)    LE MMT:   MMT Right 01/19/2021 Left 01/19/2021 02/10/21 03/01/21  Hip flexion 4+ 4- (pain in low back) 4+/5 bilaterally 5/5 bilaterally   Hip extension 4- 4-  4+/5 bilaterally  Hip abduction 4+ 4+  5/5 bilaterally  Knee flexion 4+ 4+  5/5 bilaterally   Knee extension 5 5  5/5 bilaterally  Ankle dorsiflexion 5 5    Ankle plantarflexion 5 5    Ankle inversion        Ankle eversion 5 5     (Blank rows = not tested)   LUMBAR SPECIAL TESTS:  SLR: (+)  bilaterally 03/01/21: SLR negative    FUNCTIONAL TESTS:  5 times sit to stand: 12 seconds; 03/01/21: 12 seconds  SLS<3 seconds bilaterally, hesitant to perform; SLS 7 seconds bilateral; 03/01/21: Lt 14 seconds Rt 5 seconds          TODAY'S TREATMENT  [facility] Adult PT Treatment:                                                DATE: 03/01/21 Therapeutic Exercise: Discussed gym equipment that is appropriate to utilize as part of her neighborhood  gym Reviewed and updated advanced HEP discussing frequency and duration.   Therapeutic Activity: Re-assessment to determine overall progress, educating patient on re-assessment findings and progress towards goals.    [facility] Adult PT Treatment:                                                DATE: 02/21/21 Therapeutic Exercise: Bike x 5 minutes, level 2  Leg press 2 x 10; 20 lbs  Step taps 8 inch 2 x 10 bilateral no UE support Lateral hurdle step overs 2 x 10 occasional UE support  Forward hurdle step overs 2 x 10 occasional UE support  Sit to stand 5 lb kettlebell 1 x 10      [facility] Adult PT Treatment:                                                DATE: 02/10/21 Therapeutic Exercise: NuStep level 5 x 5 minutes  Step ups 4 inch step 2 x 10 bilateral  Lateral step overs hurdle 2 x 10  Heel/toe rocks 2 x 10  Standing resisted hip abduction red band 2 x 10  Standing resisted hip extension red band 2 x 10  Seated LAQ 2 x 10; 2 lbs bilateral  Sit to stand 5 lbs 2 x 10  Updated HEP    [facility] Adult PT Treatment:                                                DATE: 02/01/21 Therapeutic Exercise: Nustep level 5 x 5 minutes Sidelying hip abduction 2 x 10 bilateral  Sit to stand 2 x 10  Standing march 2 x 10  Standing calf raise 2 x 10  Standing hip extension 2 x 10  Standing hip abduction 2 x 10           PATIENT EDUCATION:  Education details: see treatment  Person educated: patient  Education method: demonstration, handout, verbal  cues Education comprehension: verbalized understanding, returned demo      HOME EXERCISE PROGRAM: Access Code: [place]:   CLINICAL IMPRESSION:   Lulu has progressed well throughout duration of care having met all established functional goals. She demonstrates improvements in lumbar AROM, posture, hip strength, and static balance. She reports intermittent low back pain and LLE numbness, though this is less frequent than when she began PT. She demonstrates independence with advanced home program. She is therefore appropriate for discharge at this time.    REHAB POTENTIAL: Good   CLINICAL DECISION MAKING: Stable/uncomplicated   EVALUATION COMPLEXITY: Low     GOALS:   SHORT TERM GOALS:   STG Name Target Date Goal status  1 Patient will be independent and compliant with initial HEP.    Baseline:  02/02/2021 ACHIEVED  2 Patient will demonstrate knowledge and application of appropriate sitting posture to reduce stress on her low back with prolonged sitting.  Baseline:  02/09/2021 ACHIEVED    LONG TERM GOALS:    LTG Name Target Date Goal status  1 Patient will improve lumbar extension by at least  25% to improve ability complete reaching activity Baseline: 75% limited  03/02/2021 Achieved   2 Patient will maintain SLS for at least 5 seconds to improve her stability with entering/exiting her bathtub Baseline: 03/02/2021 Achieved   3 Patient will demonstrate at least 4+/5 Lt hip extensor and abductor strength to improve overall lumbopelvic stability.  Baseline:see objective 03/02/2021 Achieved   4 Patient will be independent in advanced home program to assist in management of her chronic condition.  Baseline: 03/02/2021 Achieved     PLAN: PT FREQUENCY: 1x/week   PT DURATION: 6 weeks   PLANNED INTERVENTIONS: Therapeutic exercises, Therapeutic activity, Neuro Muscular re-education, Balance training, Gait training, Patient/Family education, Joint mobilization, Stair  training, Dry Needling, Cryotherapy, Moist heat, Taping, and Manual therapy   PLAN FOR NEXT SESSION:  N/A D/C   Gwendolyn Grant, PT, DPT, ATC 03/01/21 1:56 PM

## 2021-03-14 ENCOUNTER — Encounter: Payer: Medicare Other | Admitting: Physical Therapy

## 2021-03-29 ENCOUNTER — Ambulatory Visit: Payer: Medicare Other | Admitting: Pulmonary Disease

## 2021-04-12 ENCOUNTER — Other Ambulatory Visit: Payer: Self-pay | Admitting: Gastroenterology

## 2021-04-22 ENCOUNTER — Encounter: Payer: Self-pay | Admitting: Pulmonary Disease

## 2021-04-22 ENCOUNTER — Ambulatory Visit: Payer: Medicare Other | Admitting: Pulmonary Disease

## 2021-04-22 VITALS — BP 130/62 | HR 69 | Temp 98.1°F | Ht <= 58 in | Wt 144.6 lb

## 2021-04-22 DIAGNOSIS — G4733 Obstructive sleep apnea (adult) (pediatric): Secondary | ICD-10-CM

## 2021-04-22 DIAGNOSIS — Z9989 Dependence on other enabling machines and devices: Secondary | ICD-10-CM | POA: Diagnosis not present

## 2021-04-22 NOTE — Progress Notes (Addendum)
? ?      ?Caitlin [name]    [id number]    [birth year] ? ?Primary Care Physician:Panosh, Standley Brooking, MD ? ?Referring Physician: Burnis Medin, MD ?[address] ?[place],  [place] [address] ? ?Chief complaint:  ?History of obstructive sleep apnea on BiPAP therapy ? ?HPI: ?Continues to have some difficulty tolerating BiPAP ?-Pressure change well-tolerated ?-Currently on 14/10 ?-Most significant issues with mask fit ? ?The previous mask that she was tolerating well was discontinued and she has not been able to find a mask to replace this to fit as well ? ?Continues to have very restless sleep, nonrestorative ? ?She is not very active at all during the day according to spouse and patient as well-very limited activity which she blames on her fibromyalgia ? ?Study did reveal obstructive sleep apnea titrated to BiPAP of 18/14, currently on 14/10 ? ?Patient with a known history of obstructive sleep apnea ?During recent surgery, noted to have significant desaturations, concern for sleep disordered breathing with significant ?Known snorer ?Had a sleep study done a few years back that was positive for sleep disordered breathing, no treatment was offered at the time ?Usually goes to bed about 1 AM, gets up about 10:30 AM or later ?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 ?Weight has been about the same ?Has been having more issues recently secondary to having a lot of pain and discomfort from a recent shoulder surgery ?Sleep has not been significantly consolidated recently ? ? ?Outpatient Encounter Medications as of 04/22/2021  ?Medication Sig  ? dicyclomine (BENTYL) 10 MG capsule TAKE 1 CAPSULE BY MOUTH EVERY 8 HOURS AS NEEDED FOR  SPASM  ? escitalopram (LEXAPRO) 10 MG tablet Take 1 tablet by mouth once daily  ? esomeprazole (NEXIUM) 20 MG capsule Take 20 mg by mouth daily at 12 noon.  ? hydrochlorothiazide (HYDRODIURIL) 12.5 MG tablet Take 1 tablet by mouth once daily  ? potassium chloride (KLOR-CON) 10 MEQ tablet TAKE 1  TABLET BY MOUTH EVERY OTHER DAY WITH FLUID TABLET  ? ?No facility-administered encounter medications on file as of 04/22/2021.  ? ? ?Allergies as of 04/22/2021 - Review Complete 04/22/2021  ?Allergen Reaction Noted  ? Mobic [meloxicam] Diarrhea 11/28/2011  ? Aleve [naproxen]  05/15/2016  ? Codeine phosphate Other (See Comments) 03/15/2006  ? Colestipol hcl  05/24/2017  ? Omeprazole-sodium bicarbonate  12/23/2008  ? Other  10/23/2017  ? Protonix [pantoprazole sodium] Diarrhea 06/05/2012  ? Sodium  11/16/2017  ? Sulfamethoxazole Diarrhea and Nausea And Vomiting 03/15/2006  ? ? ?Past Medical History:  ?Diagnosis Date  ? Allergic rhinitis 07/11/2006  ? Back pain 10/09/2007  ? Blood transfusion without reported diagnosis   ? Diaphragmatic hernia 07/11/2006  ? Diverticulosis of colon   ? Eczema 07/11/2006  ? Elevated cholesterol 04/29/2012  ? Enteritis   ? Erythema nodosum   ? Tends to be seasonal has had a negative chest x-ray in the past negative PPD  ? Essential hypertension 07/11/2006  ? Fatigue 03/12/2007  ? Fibromyalgia   ? Gastroparesis 12/23/2008  ? Generalized anxiety disorder 09/01/2014  ? GERD (gastroesophageal reflux disease)   ? History of stricture and dilatation and Nissen surgery  ? 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 History of cataract   ? bilateral; s/p surgery  ? Hyperglycemia 08/15/2006  ? Irritable bowel syndrome 12/23/2008  ? Leg cramps, nocturnal 11/11/2007  ? 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  ? MCI (mild cognitive impairment) 10/22/2018  ? Obstructive sleep apnea 04/23/2007  ? uses  CPAP nightly; mask has leak  ? Osteoarthritis 11/28/2011  ? Osteopenia   ? Prediabetes 04/19/2011  ? Vitamin B12 deficiency 05/13/2010  ? ? ?Past Surgical History:  ?Procedure Laterality Date  ? ABDOMINAL HYSTERECTOMY    ? bone spur    ? CATARACT EXTRACTION W/ INTRAOCULAR LENS  IMPLANT, BILATERAL    ? CESAREAN SECTION    ? x 1  ? CHOLECYSTECTOMY    ? COLONOSCOPY    ? DENTAL SURGERY    ? ESOPHAGOGASTRODUODENOSCOPY     ? stricture and dilatation  ? left shoulder surgery  06/08, 07/12/17  ? x 2  ? MOUTH SURGERY Right 06/03/2019  ? NISSEN FUNDOPLICATION    ? ROTATOR CUFF REPAIR Left   ? rt knee surgery    ? scope  ? UPPER GASTROINTESTINAL ENDOSCOPY    ? ? ?Family History  ?Problem Relation Age of Onset  ? Depression Mother   ? Kidney cancer Mother   ? Dementia Mother   ?     Unspecified, likely secondary to other medical condition (cancer)  ? Depression Father   ? Hiatal hernia Father   ? COPD Sister   ? Dementia Sister   ?     Unspecified, likely secondary to other medical condition  ? Nephrolithiasis Other   ? Colon cancer Neg Hx   ? Esophageal cancer Neg Hx   ? Pancreatic cancer Neg Hx   ? Rectal cancer Neg Hx   ? Stomach cancer Neg Hx   ? ? ?Social History  ? ?Socioeconomic History  ? Marital status: Married  ?  Spouse name: Not on file  ? Number of children: 1  ? Years of education: 4  ? Highest education level: Some college, no degree  ?Occupational History  ? Occupation: Retired  ?Tobacco Use  ? Smoking status: Never  ? Smokeless tobacco: Never  ?Vaping Use  ? Vaping Use: Never used  ?Substance and Sexual Activity  ? Alcohol use: No  ?  Alcohol/week: 0.0 standard drinks  ? Drug use: No  ? Sexual activity: Not on file  ?Other Topics Concern  ? Not on file  ?Social History Narrative  ? Unemployed  ? Married  ? HH of 2    Has grandchildren out of state  ? No pets  ? Sis. died 12/21/2009 COPD  ?   ? Right handed  ? ?Social Determinants of Health  ? ?Financial Resource Strain: Low Risk   ? Difficulty of Paying Living Expenses: Not hard at all  ?Food Insecurity: No Food Insecurity  ? Worried About Charity fundraiser in the Last Year: Never true  ? Ran Out of Food in the Last Year: Never true  ?Transportation Needs: No Transportation Needs  ? Lack of Transportation (Medical): No  ? Lack of Transportation (Non-Medical): No  ?Physical Activity: Inactive  ? Days of Exercise per Week: 0 days  ? Minutes of Exercise per Session: 0  min  ?Stress: Not on file  ?Social Connections: Moderately Integrated  ? Frequency of Communication with Friends and Family: Twice a week  ? Frequency of Social Gatherings with Friends and Family: Twice a week  ? Attends Religious Services: 1 to 4 times per year  ? Active Member of Clubs or Organizations: No  ? Attends Archivist Meetings: Never  ? Marital Status: Married  ?Intimate Partner Violence: Not At Risk  ? Fear of Current or Ex-Partner: No  ? Emotionally Abused: No  ? Physically Abused:  No  ? Sexually Abused: No  ? ? ?Review of Systems  ?HENT: Negative.    ?Eyes: Negative.   ?Respiratory:  Positive for apnea and shortness of breath.   ?Cardiovascular: Negative.   ?Gastrointestinal: Negative.   ?Endocrine: Negative.   ?Musculoskeletal:  Positive for arthralgias and myalgias.  ?Psychiatric/Behavioral:  Positive for sleep disturbance.   ? ?Vitals:  ? 04/22/21 1437  ?BP: 130/62  ?Pulse: 69  ?Temp: 98.1 ?F (36.7 ?C)  ?SpO2: 96%  ? ? ? ?Physical Exam ?Constitutional:   ?   Appearance: She is well-developed. She is obese.  ?HENT:  ?   Head: Normocephalic and atraumatic.  ?   Mouth/Throat:  ?   Mouth: Mucous membranes are moist.  ?Eyes:  ?   General:     ?   Right eye: No discharge.     ?   Left eye: No discharge.  ?   Conjunctiva/sclera: Conjunctivae normal.  ?   Pupils: Pupils are equal, round, and reactive to light.  ?Neck:  ?   Thyroid: No thyromegaly.  ?Cardiovascular:  ?   Rate and Rhythm: Normal rate.  ?Pulmonary:  ?   Effort: Pulmonary effort is normal.  ?   Breath sounds: Normal breath sounds.  ?Musculoskeletal:     ?   General: Normal range of motion.  ?   Cervical back: Normal range of motion and neck supple.  ?Neurological:  ?   Mental Status: She is alert.  ?Psychiatric:     ?   Mood and Affect: Mood normal.  ? ? ?  03/15/2018  ?  1:00 PM 02/13/2018  ? 11:00 AM 09/25/2017  ?  3:00 PM  ?Results of the Epworth flowsheet  ?Sitting and reading 0 0 0  ?Watching TV '3 3 1  '$ ?Sitting, inactive in a  public place (e.g. a theatre or a meeting) 0 0 0  ?As a passenger in a car for an hour without a break 0 0 0  ?Lying down to rest in the afternoon when circumstances permit '3 3 2  '$ ?Sitting and talking to someone 0

## 2021-04-22 NOTE — Patient Instructions (Signed)
DME referral for CPAP supplies ?-Nose mask with a chinstrap ?-May also try mouth tape for oral venting ? ?I will see you back in about 3 months ? ?Call with significant concerns ?

## 2021-05-09 ENCOUNTER — Other Ambulatory Visit: Payer: Self-pay | Admitting: Internal Medicine

## 2021-05-16 ENCOUNTER — Other Ambulatory Visit: Payer: Self-pay | Admitting: Internal Medicine

## 2021-05-23 ENCOUNTER — Ambulatory Visit: Payer: Medicare Other | Admitting: Internal Medicine

## 2021-05-24 ENCOUNTER — Ambulatory Visit: Payer: Medicare Other | Admitting: Internal Medicine

## 2021-05-24 ENCOUNTER — Encounter: Payer: Self-pay | Admitting: Internal Medicine

## 2021-05-24 VITALS — BP 128/80 | HR 60 | Temp 97.8°F | Ht <= 58 in | Wt 144.8 lb

## 2021-05-24 DIAGNOSIS — Z79899 Other long term (current) drug therapy: Secondary | ICD-10-CM

## 2021-05-24 DIAGNOSIS — I10 Essential (primary) hypertension: Secondary | ICD-10-CM | POA: Diagnosis not present

## 2021-05-24 DIAGNOSIS — F411 Generalized anxiety disorder: Secondary | ICD-10-CM

## 2021-05-24 DIAGNOSIS — M8580 Other specified disorders of bone density and structure, unspecified site: Secondary | ICD-10-CM

## 2021-05-24 DIAGNOSIS — R7303 Prediabetes: Secondary | ICD-10-CM | POA: Diagnosis not present

## 2021-05-24 DIAGNOSIS — E538 Deficiency of other specified B group vitamins: Secondary | ICD-10-CM

## 2021-05-24 DIAGNOSIS — R5383 Other fatigue: Secondary | ICD-10-CM

## 2021-05-24 DIAGNOSIS — G4733 Obstructive sleep apnea (adult) (pediatric): Secondary | ICD-10-CM | POA: Diagnosis not present

## 2021-05-24 DIAGNOSIS — G3184 Mild cognitive impairment, so stated: Secondary | ICD-10-CM

## 2021-05-24 DIAGNOSIS — K3184 Gastroparesis: Secondary | ICD-10-CM

## 2021-05-24 NOTE — Patient Instructions (Addendum)
Good to see you today .  ?Lab monitoring.  ?Checking vit d and b12  to make sure ok.  ? ?

## 2021-05-24 NOTE — Progress Notes (Signed)
? ?Chief Complaint  ?Patient presents with  ? Medication Refill  ? ? ?HPI: ?Caitlin [name] 76 y.o. come in for Chronic disease management  ?Last visit with this provider 5 22 ?Under care  neuro  [facility] most recently she did have a time where she got lost driving to our office she came in today.  Socially she can play [place] otherwise does not get out that much. ?Does not feel that well feels unmotivated gets headaches in the morning ?Undergoing physical therapy before and after a fall most recently has been difficult after a fall  and before fall.L time she thinks sometimes she thinks her balance is not that good. ?OSA on cpap   worse  mask too big she thinks.  Waiting on a proper fitting mask needs to wait until reimbursed to get the proper 1. ?Meds  needs refills is on low-dose HCTZ and potassium. ?Lexapro  on going .  Thinks it helps not sure. ?Sleep off so is taking melatonin    ?Bentyl bid taking. ?GI taking a PPI but was worried about it potential side effects or difficulties when listening to people's pharmacy however when she does not take it she does get heartburn.  She has had antireflux surgery. ? ?ROS: See pertinent positives and negatives per HPI. ? ?Past Medical History:  ?Diagnosis Date  ? Allergic rhinitis 07/11/2006  ? Back pain 10/09/2007  ? Blood transfusion without reported diagnosis   ? Diaphragmatic hernia 07/11/2006  ? Diverticulosis of colon   ? Eczema 07/11/2006  ? Elevated cholesterol 04/29/2012  ? Enteritis   ? Erythema nodosum   ? Tends to be seasonal has had a negative chest x-ray in the past negative PPD  ? Essential hypertension 07/11/2006  ? Fatigue 03/12/2007  ? Fibromyalgia   ? Gastroparesis 12/23/2008  ? Generalized anxiety disorder 09/01/2014  ? GERD (gastroesophageal reflux disease)   ? History of stricture and dilatation and Nissen surgery  ? 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 History of cataract   ? bilateral; s/p surgery  ? Hyperglycemia 08/15/2006  ? Irritable bowel syndrome  12/23/2008  ? Leg cramps, nocturnal 11/11/2007  ? 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  ? MCI (mild cognitive impairment) 10/22/2018  ? Obstructive sleep apnea 04/23/2007  ? uses CPAP nightly; mask has leak  ? Osteoarthritis 11/28/2011  ? Osteopenia   ? Prediabetes 04/19/2011  ? Vitamin B12 deficiency 05/13/2010  ? ? ?Family History  ?Problem Relation Age of Onset  ? Depression Mother   ? Kidney cancer Mother   ? Dementia Mother   ?     Unspecified, likely secondary to other medical condition (cancer)  ? Depression Father   ? Hiatal hernia Father   ? COPD Sister   ? Dementia Sister   ?     Unspecified, likely secondary to other medical condition  ? Nephrolithiasis Other   ? Colon cancer Neg Hx   ? Esophageal cancer Neg Hx   ? Pancreatic cancer Neg Hx   ? Rectal cancer Neg Hx   ? Stomach cancer Neg Hx   ? ? ?Social History  ? ?Socioeconomic History  ? Marital status: Married  ?  Spouse name: Not on file  ? Number of children: 1  ? Years of education: 71  ? Highest education level: Some college, no degree  ?Occupational History  ? Occupation: Retired  ?Tobacco Use  ? Smoking status: Never  ? Smokeless tobacco: Never  ?Vaping Use  ? Vaping Use: Never  used  ?Substance and Sexual Activity  ? Alcohol use: No  ?  Alcohol/week: 0.0 standard drinks  ? Drug use: No  ? Sexual activity: Not on file  ?Other Topics Concern  ? Not on file  ?Social History Narrative  ? Unemployed  ? Married  ? HH of 2    Has grandchildren out of state  ? No pets  ? Sis. died 12/27/09 COPD  ?   ? Right handed  ? ?Social Determinants of Health  ? ?Financial Resource Strain: Low Risk   ? Difficulty of Paying Living Expenses: Not hard at all  ?Food Insecurity: No Food Insecurity  ? Worried About Charity fundraiser in the Last Year: Never true  ? Ran Out of Food in the Last Year: Never true  ?Transportation Needs: No Transportation Needs  ? Lack of Transportation (Medical): No  ? Lack of Transportation (Non-Medical): No  ?Physical Activity:  Inactive  ? Days of Exercise per Week: 0 days  ? Minutes of Exercise per Session: 0 min  ?Stress: Not on file  ?Social Connections: Moderately Integrated  ? Frequency of Communication with Friends and Family: Twice a week  ? Frequency of Social Gatherings with Friends and Family: Twice a week  ? Attends Religious Services: 1 to 4 times per year  ? Active Member of Clubs or Organizations: No  ? Attends Archivist Meetings: Never  ? Marital Status: Married  ? ? ?Outpatient Medications Prior to Visit  ?Medication Sig Dispense Refill  ? dicyclomine (BENTYL) 10 MG capsule TAKE 1 CAPSULE BY MOUTH EVERY 8 HOURS AS NEEDED FOR  SPASM 90 capsule 0  ? escitalopram (LEXAPRO) 10 MG tablet Take 1 tablet by mouth once daily 90 tablet 0  ? esomeprazole (NEXIUM) 20 MG capsule Take 20 mg by mouth daily at 12 noon.    ? hydrochlorothiazide (HYDRODIURIL) 12.5 MG tablet Take 1 tablet by mouth once daily 30 tablet 0  ? potassium chloride (KLOR-CON) 10 MEQ tablet TAKE 1 TABLET BY MOUTH EVERY OTHER DAY WITH  FLUID  TABLET 30 tablet 0  ? ?No facility-administered medications prior to visit.  ? ? ? ?EXAM: ? ?BP 128/80 (BP Location: Left Arm, Patient Position: Sitting, Cuff Size: Normal)   Pulse 60   Temp 97.8 ?F (36.6 ?C) (Oral)   Ht '4\' 10"'$  (1.473 m)   Wt 144 lb 12.8 oz (65.7 kg)   SpO2 98%   BMI 30.26 kg/m?  ? ?Body mass index is 30.26 kg/m?. ? ?GENERAL: vitals reviewed and listed above, alert, oriented, appears well hydrated and in no acute distress ?HEENT: atraumatic, conjunctiva  clear, no obvious abnormalities on inspection of external nose and ears  ?NECK: no obvious masses on inspection palpation  ?LUNGS: clear to auscultation bilaterally, no wheezes, rales or rhonchi, good air movement ?CV: HRRR, no clubbing cyanosis slight edema line at socks nl cap refill  ?MS: moves all extremities without noticeable focal  abnormality ?PSYCH: pleasant and cooperative, normal speech and thought process grossly.  Appears to be  slightly down normal motor function.  Gait appears grossly normal ?Lab Results  ?Component Value Date  ? WBC 8.3 05/17/2020  ? HGB 13.6 05/17/2020  ? HCT 39.9 05/17/2020  ? PLT 336.0 05/17/2020  ? GLUCOSE 91 05/17/2020  ? CHOL 164 05/07/2019  ? TRIG 76.0 05/07/2019  ? HDL 85.90 05/07/2019  ? [place] 63 05/07/2019  ? ALT 16 05/07/2019  ? AST 16 05/07/2019  ? NA 137 05/17/2020  ?  K 3.8 05/17/2020  ? CL 99 05/17/2020  ? CREATININE 0.92 05/17/2020  ? BUN 13 05/17/2020  ? CO2 28 05/17/2020  ? TSH 1.97 05/17/2020  ? HGBA1C 5.7 05/17/2020  ? MICROALBUR 1.6 04/26/2011  ? ?BP Readings from Last 3 Encounters:  ?05/24/21 128/80  ?04/22/21 130/62  ?01/24/21 (!) 164/71  ? ? ?ASSESSMENT AND PLAN: ? ?Discussed the following assessment and plan: ? ?Essential hypertension - Plan: Comprehensive metabolic panel, CBC with Differential/Platelet, Hemoglobin A1c, Vitamin B12, VITAMIN D 25 Hydroxy (Vit-D Deficiency, Fractures), VITAMIN D 25 Hydroxy (Vit-D Deficiency, Fractures), Vitamin B12, Hemoglobin A1c, CBC with Differential/Platelet, Comprehensive metabolic panel ? ?Medication management - Plan: Comprehensive metabolic panel, CBC with Differential/Platelet, Hemoglobin A1c, Vitamin B12, VITAMIN D 25 Hydroxy (Vit-D Deficiency, Fractures), VITAMIN D 25 Hydroxy (Vit-D Deficiency, Fractures), Vitamin B12, Hemoglobin A1c, CBC with Differential/Platelet, Comprehensive metabolic panel ? ?Prediabetes - Plan: Comprehensive metabolic panel, CBC with Differential/Platelet, Hemoglobin A1c, Vitamin B12, VITAMIN D 25 Hydroxy (Vit-D Deficiency, Fractures), VITAMIN D 25 Hydroxy (Vit-D Deficiency, Fractures), Vitamin B12, Hemoglobin A1c, CBC with Differential/Platelet, Comprehensive metabolic panel ? ?Obstructive sleep apnea - Plan: Vitamin B12, VITAMIN D 25 Hydroxy (Vit-D Deficiency, Fractures), VITAMIN D 25 Hydroxy (Vit-D Deficiency, Fractures), Vitamin B12 ? ?MCI (mild cognitive impairment) - Plan: Vitamin B12, VITAMIN D 25 Hydroxy (Vit-D  Deficiency, Fractures), VITAMIN D 25 Hydroxy (Vit-D Deficiency, Fractures), Vitamin B12 ? ?Gastroparesis - Plan: VITAMIN D 25 Hydroxy (Vit-D Deficiency, Fractures), VITAMIN D 25 Hydroxy (Vit-D Deficiency, Fractures) ? ?Vitamin B

## 2021-05-25 LAB — COMPREHENSIVE METABOLIC PANEL
ALT: 13 U/L (ref 0–35)
AST: 15 U/L (ref 0–37)
Albumin: 4.4 g/dL (ref 3.5–5.2)
Alkaline Phosphatase: 68 U/L (ref 39–117)
BUN: 14 mg/dL (ref 6–23)
CO2: 27 mEq/L (ref 19–32)
Calcium: 9.5 mg/dL (ref 8.4–10.5)
Chloride: 101 mEq/L (ref 96–112)
Creatinine, Ser: 0.89 mg/dL (ref 0.40–1.20)
GFR: 63.5 mL/min (ref >60.00)
Glucose, Bld: 83 mg/dL (ref 70–99)
Potassium: 3.8 mEq/L (ref 3.5–5.1)
Sodium: 139 mEq/L (ref 135–145)
Total Bilirubin: 0.7 mg/dL (ref 0.2–1.2)
Total Protein: 6.8 g/dL (ref 6.0–8.3)

## 2021-05-25 LAB — CBC WITH DIFFERENTIAL/PLATELET
Basophils Absolute: 0.2 10*3/uL — ABNORMAL HIGH (ref 0.0–0.1)
Basophils Relative: 2.2 % (ref 0.0–3.0)
Eosinophils Absolute: 0.2 10*3/uL (ref 0.0–0.7)
Eosinophils Relative: 2.4 % (ref 0.0–5.0)
HCT: 40.8 % (ref 36.0–46.0)
Hemoglobin: 13.6 g/dL (ref 12.0–15.0)
Lymphocytes Relative: 26.7 % (ref 12.0–46.0)
Lymphs Abs: 1.9 10*3/uL (ref 0.7–4.0)
MCHC: 33.4 g/dL (ref 30.0–36.0)
MCV: 89 fl (ref 78.0–100.0)
Monocytes Absolute: 0.4 10*3/uL (ref 0.1–1.0)
Monocytes Relative: 5.9 % (ref 3.0–12.0)
Neutro Abs: 4.4 10*3/uL (ref 1.4–7.7)
Neutrophils Relative %: 62.8 % (ref 43.0–77.0)
Platelets: 273 10*3/uL (ref 150.0–400.0)
RBC: 4.59 Mil/uL (ref 3.87–5.11)
RDW: 14.5 % (ref 11.5–15.5)
WBC: 7 10*3/uL (ref 4.0–10.5)

## 2021-05-25 LAB — VITAMIN D 25 HYDROXY (VIT D DEFICIENCY, FRACTURES): VITD: 37.31 ng/mL (ref 30.00–100.00)

## 2021-05-25 LAB — VITAMIN B12: Vitamin B-12: 152 pg/mL — ABNORMAL LOW (ref 211–911)

## 2021-05-25 LAB — HEMOGLOBIN A1C: Hgb A1c MFr Bld: 5.5 % (ref 4.6–6.5)

## 2021-05-26 ENCOUNTER — Encounter: Payer: Self-pay | Admitting: Internal Medicine

## 2021-05-31 NOTE — Progress Notes (Signed)
Vitamin B12 level is quite low again. May be adding to fatigue . And imbalance  ?Please began dissolvable  in mouth, sublingual vitamin B12  1000 mcg or more per day .  ?Plan repeat b12 level in 2-3 months . ?Also if want to try a different medication  for depressive anxiety symptoms  instead of the lexapro can make a follow up  to discuss .

## 2021-06-02 ENCOUNTER — Telehealth: Payer: Self-pay | Admitting: Pulmonary Disease

## 2021-06-02 NOTE — Telephone Encounter (Signed)
Called patient but she did not answer. An order was placed back in April and sent to [facility].

## 2021-06-02 NOTE — Telephone Encounter (Signed)
Patient states [facility] does not have the order. Patient phone number is [phone].

## 2021-06-06 ENCOUNTER — Telehealth (INDEPENDENT_AMBULATORY_CARE_PROVIDER_SITE_OTHER): Payer: Medicare Other | Admitting: Internal Medicine

## 2021-06-06 ENCOUNTER — Encounter: Payer: Self-pay | Admitting: Internal Medicine

## 2021-06-06 DIAGNOSIS — F32A Depression, unspecified: Secondary | ICD-10-CM

## 2021-06-06 DIAGNOSIS — E538 Deficiency of other specified B group vitamins: Secondary | ICD-10-CM | POA: Diagnosis not present

## 2021-06-06 DIAGNOSIS — Z79899 Other long term (current) drug therapy: Secondary | ICD-10-CM | POA: Diagnosis not present

## 2021-06-06 DIAGNOSIS — R5383 Other fatigue: Secondary | ICD-10-CM

## 2021-06-06 DIAGNOSIS — F411 Generalized anxiety disorder: Secondary | ICD-10-CM

## 2021-06-06 MED ORDER — ESCITALOPRAM OXALATE 10 MG PO TABS: 15.0000 mg | ORAL_TABLET | Freq: Every day | ORAL | 0 refills | Status: DC

## 2021-06-06 NOTE — Progress Notes (Signed)
Virtual Visit via Video Note  I connected with Caitlin [name] on 06/06/21 at  3:00 PM EDT by a video enabled telemedicine application and verified that I am speaking with the correct person using two identifiers. Location patient: home Location provider: home office Persons participating in the virtual visit: patient, provider     HPI: Caitlin [name] presents for video visit follow-up labs discussed medication She did get some dissolvable B12 can only find the 5000 mcg per her husband so is just starting taking it. In regard to mood brother is quite sick worried if he is on a downhill spiral short-lived.  Has GI problems with digestion no specific diagnosis given. Mood is difficult because of these problems and family issues.  Makes her depression is somewhat worse.  She always has a problem sleeping. She is on the Lexapro 10 mg.  In the remote past she was on sertraline. Has OSA but unable to use the mask it does not fit her face and leaks, hopeful that she will get an updated mask that she can use which might help her fatigue by treating her sleep apnea ROS: See pertinent positives and negatives per HPI.  Past Medical History:  Diagnosis Date   Allergic rhinitis 07/11/2006   Back pain 10/09/2007   Blood transfusion without reported diagnosis    Diaphragmatic hernia 07/11/2006   Diverticulosis of colon    Eczema 07/11/2006   Elevated cholesterol 04/29/2012   Enteritis    Erythema nodosum    Tends to be seasonal has had a negative chest x-ray in the past negative PPD   Essential hypertension 07/11/2006   Fatigue 03/12/2007   Fibromyalgia    Gastroparesis 12/23/2008   Generalized anxiety disorder 09/01/2014   GERD (gastroesophageal reflux disease)    History of stricture and dilatation and Nissen surgery   Hearing loss, left ear 11/27/2008   History of cataract    bilateral; s/p surgery   Hyperglycemia 08/15/2006   Irritable bowel syndrome 12/23/2008   Leg cramps, nocturnal  11/11/2007   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   MCI (mild cognitive impairment) 10/22/2018   Obstructive sleep apnea 04/23/2007   uses CPAP nightly; mask has leak   Osteoarthritis 11/28/2011   Osteopenia    Prediabetes 04/19/2011   Vitamin B12 deficiency 05/13/2010    Past Surgical History:  Procedure Laterality Date   ABDOMINAL HYSTERECTOMY     bone spur     CATARACT EXTRACTION W/ INTRAOCULAR LENS  IMPLANT, BILATERAL     CESAREAN SECTION     x 1   CHOLECYSTECTOMY     COLONOSCOPY     DENTAL SURGERY     ESOPHAGOGASTRODUODENOSCOPY     stricture and dilatation   left shoulder surgery  06/08, 07/12/17   x 2   MOUTH SURGERY Right 35/00/9381   NISSEN FUNDOPLICATION     ROTATOR CUFF REPAIR Left    rt knee surgery     scope   UPPER GASTROINTESTINAL ENDOSCOPY      Family History  Problem Relation Age of Onset   Depression Mother    Kidney cancer Mother    Dementia Mother        Unspecified, likely secondary to other medical condition (cancer)   Depression Father    Hiatal hernia Father    COPD Sister    Dementia Sister        Unspecified, likely secondary to other medical condition   Nephrolithiasis Other    Colon cancer Neg Hx  Esophageal cancer Neg Hx    Pancreatic cancer Neg Hx    Rectal cancer Neg Hx    Stomach cancer Neg Hx     Social History   Tobacco Use   Smoking status: Never   Smokeless tobacco: Never  Vaping Use   Vaping Use: Never used  Substance Use Topics   Alcohol use: No    Alcohol/week: 0.0 standard drinks   Drug use: No      Current Outpatient Medications:    Cyanocobalamin (VITAMIN B-12) 5000 MCG TBDP, Place 5,000 mcg under the tongue daily., Disp: , Rfl:    dicyclomine (BENTYL) 10 MG capsule, TAKE 1 CAPSULE BY MOUTH EVERY 8 HOURS AS NEEDED FOR  SPASM, Disp: 90 capsule, Rfl: 0   esomeprazole (NEXIUM) 20 MG capsule, Take 20 mg by mouth daily at 12 noon., Disp: , Rfl:    hydrochlorothiazide (HYDRODIURIL) 12.5 MG tablet, Take 1 tablet  by mouth once daily, Disp: 30 tablet, Rfl: 0   potassium chloride (KLOR-CON) 10 MEQ tablet, TAKE 1 TABLET BY MOUTH EVERY OTHER DAY WITH  FLUID  TABLET, Disp: 30 tablet, Rfl: 0   escitalopram (LEXAPRO) 10 MG tablet, Take 1.5 tablets (15 mg total) by mouth daily. Dosage change, Disp: 135 tablet, Rfl: 0  EXAM: BP Readings from Last 3 Encounters:  05/24/21 128/80  04/22/21 130/62  01/24/21 (!) 164/71    VITALS per patient if applicable:  GENERAL: alert, oriented, appears well and in no acute distress  HEENT: atraumatic, conjunttiva clear, no obvious abnormalities on inspection of external nose and ears  NECK: normal movements of the head and neck  LUNGS: on inspection no signs of respiratory distress, breathing rate appears normal, no obvious gross SOB, gasping or wheezing  CV: no obvious cyanosis PSYCH/NEURO: pleasant and cooperative,  speech and thought processing grossly intact Lab Results  Component Value Date   WBC 7.0 05/24/2021   HGB 13.6 05/24/2021   HCT 40.8 05/24/2021   PLT 273.0 05/24/2021   GLUCOSE 83 05/24/2021   CHOL 164 05/07/2019   TRIG 76.0 05/07/2019   HDL 85.90 05/07/2019   LDLCALC 63 05/07/2019   ALT 13 05/24/2021   AST 15 05/24/2021   NA 139 05/24/2021   K 3.8 05/24/2021   CL 101 05/24/2021   CREATININE 0.89 05/24/2021   BUN 14 05/24/2021   CO2 27 05/24/2021   TSH 1.97 05/17/2020   HGBA1C 5.5 05/24/2021   MICROALBUR 1.6 04/26/2011    ASSESSMENT AND PLAN:  Discussed the following assessment and plan:    ICD-10-CM   1. Vitamin B 12 deficiency  E53.8 Vitamin B12    2. Medication management  Z79.899     3. Other fatigue  R53.83     4. Depression, unspecified depression type  F32.A     5. Generalized anxiety disorder  F41.1      Ok to take 5000 mcg per day  B12 and will check reading in a month   Increase lexapro 15 mg per day .   Will send in rx   Plan fu  med check in a month after getting b12 level  Options to use Wellbutrin  and or chn  to snri discussed but  taking easiest pathway to try at this time  External factors ( family illness ) also involved.   Counseled.   Expectant management and discussion of plan and treatment with opportunity to ask questions and all were answered. The patient agreed with the plan and demonstrated an understanding  of the instructions.   Advised to call back or seek an in-person evaluation if worsening  or having  further concerns  in interim. Return in about 1 month (around 07/07/2021) for b12 level  and fu inc cdose of lexapro.  Shanon Ace, MD

## 2021-06-15 ENCOUNTER — Other Ambulatory Visit: Payer: Self-pay | Admitting: Gastroenterology

## 2021-06-17 ENCOUNTER — Other Ambulatory Visit: Payer: Self-pay | Admitting: Gastroenterology

## 2021-06-21 ENCOUNTER — Other Ambulatory Visit: Payer: Self-pay | Admitting: Internal Medicine

## 2021-06-25 ENCOUNTER — Other Ambulatory Visit: Payer: Self-pay | Admitting: Internal Medicine

## 2021-06-27 NOTE — Telephone Encounter (Signed)
Pt call and stated she is out off hydrochlorothiazide (HYDRODIURIL) 12.5 MG tablet and want to know why she was denied.

## 2021-07-07 ENCOUNTER — Other Ambulatory Visit: Payer: Self-pay | Admitting: Internal Medicine

## 2021-07-08 ENCOUNTER — Other Ambulatory Visit (INDEPENDENT_AMBULATORY_CARE_PROVIDER_SITE_OTHER): Payer: Medicare Other

## 2021-07-08 DIAGNOSIS — E538 Deficiency of other specified B group vitamins: Secondary | ICD-10-CM | POA: Diagnosis not present

## 2021-07-08 LAB — VITAMIN B12: Vitamin B-12: 1316 pg/mL — ABNORMAL HIGH (ref 211–911)

## 2021-07-14 NOTE — Telephone Encounter (Signed)
Called and spoke with pt to see if she received her mask for her BIPAP and she said she did go to [facility] and has received a new mask. Nothing further needed.

## 2021-07-26 ENCOUNTER — Inpatient Hospital Stay: Payer: Medicare Other | Admitting: Internal Medicine

## 2021-08-02 ENCOUNTER — Encounter: Payer: Self-pay | Admitting: Pulmonary Disease

## 2021-08-02 ENCOUNTER — Ambulatory Visit: Payer: Medicare Other | Admitting: Pulmonary Disease

## 2021-08-02 VITALS — BP 118/68 | HR 66 | Temp 98.1°F | Ht <= 58 in | Wt 147.0 lb

## 2021-08-02 DIAGNOSIS — G4733 Obstructive sleep apnea (adult) (pediatric): Secondary | ICD-10-CM

## 2021-08-02 DIAGNOSIS — Z9989 Dependence on other enabling machines and devices: Secondary | ICD-10-CM

## 2021-08-02 NOTE — Patient Instructions (Signed)
We will contact [employer] to see what we can effectuate on our end regarding a new mask and mask fit   Stay in touch with them to get a new mask  Continue current pressures  Once we have a better fitting mask, we can adjust the pressures to see if that works better  I will see you in about 3 months  Call with significant concerns

## 2021-08-02 NOTE — Progress Notes (Unsigned)
Caitlin [name]    [id number]    [birth year]  Primary Care Physician:Panosh, Standley Brooking, MD  Referring Physician: Burnis Medin, [place],  [place] [address]  Chief complaint:  History of obstructive sleep apnea on BiPAP therapy  HPI:  Continues to have difficulty tolerating BiPAP -Mostly a mask issue -Just recently got a new mask but it is the same design that she had before  Usually goes to bed about midnight to about 2 AM Usually tries to sleep about 6 hours  Does not usually feel rested in the morning  The previous mask that she was tolerating well was discontinued and she has not been able to find a mask to replace this to fit as well  Continues to have very restless sleep, nonrestorative  Still remains not very active during the day, has a history of fibromyalgia  Study did reveal obstructive sleep apnea titrated to BiPAP of 18/14, currently on 14/10  Patient with a known history of obstructive sleep apnea During recent surgery, noted to have significant desaturations, concern for sleep disordered breathing with significant Known snorer Had a sleep study done a few years back that was positive for sleep disordered breathing, no treatment was offered at the time  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   dicyclomine (BENTYL) 10 MG capsule TAKE 1 CAPSULE BY MOUTH EVERY 8 HOURS AS NEEDED FOR SPASM   escitalopram (LEXAPRO) 10 MG tablet Take 1.5 tablets (15 mg total) by mouth daily. Dosage change   esomeprazole (NEXIUM) 20 MG capsule Take 20 mg by mouth daily at 12 noon.   hydrochlorothiazide  (HYDRODIURIL) 12.5 MG tablet Take 1 tablet (12.5 mg total) by mouth daily.   potassium chloride (KLOR-CON) 10 MEQ tablet TAKE 1 TABLET BY MOUTH EVERY OTHER DAY WITH FLUID TABLET . APPOINTMENT REQUIRED FOR FUTURE REFILLS   No facility-administered encounter medications on file as of 08/02/2021.    Allergies as of 08/02/2021 - Review Complete 08/02/2021  Allergen Reaction Noted   Codeine phosphate Other (See Comments) 03/15/2006   Mobic [meloxicam] Diarrhea 11/28/2011   Protonix [pantoprazole sodium] Diarrhea 06/05/2012   Aleve [naproxen] Nausea Only 05/15/2016   Colestipol hcl  05/24/2017   Omeprazole-sodium bicarbonate Diarrhea 12/23/2008   Other Other (See Comments) 10/23/2017   Sodium  11/16/2017   Sulfamethoxazole Diarrhea and Nausea And Vomiting 03/15/2006    Past Medical History:  Diagnosis Date   Allergic rhinitis 07/11/2006   Back pain 10/09/2007   Blood transfusion without reported diagnosis    Diaphragmatic hernia 07/11/2006   Diverticulosis of colon    Eczema 07/11/2006   Elevated cholesterol 04/29/2012   Enteritis    Erythema nodosum    Tends to be seasonal has had a negative chest x-ray in the past negative PPD   Essential hypertension 07/11/2006   Fatigue 03/12/2007   Fibromyalgia    Gastroparesis 12/23/2008   Generalized anxiety disorder 09/01/2014   GERD (gastroesophageal reflux disease)    History of stricture and dilatation and Nissen surgery   Hearing loss, left ear 11/27/2008   History of cataract    bilateral; s/p  surgery   Hyperglycemia 08/15/2006   Irritable bowel syndrome 12/23/2008   Leg cramps, nocturnal 11/11/2007   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   MCI (mild cognitive impairment) 10/22/2018   Obstructive sleep apnea 04/23/2007   uses CPAP nightly; mask has leak   Osteoarthritis 11/28/2011   Osteopenia    Prediabetes 04/19/2011   Vitamin B12 deficiency 05/13/2010    Past Surgical History:  Procedure Laterality Date   ABDOMINAL  HYSTERECTOMY     bone spur     CATARACT EXTRACTION W/ INTRAOCULAR LENS  IMPLANT, BILATERAL     CESAREAN SECTION     x 1   CHOLECYSTECTOMY     COLONOSCOPY     DENTAL SURGERY     ESOPHAGOGASTRODUODENOSCOPY     stricture and dilatation   left shoulder surgery  06/08, 07/12/17   x 2   MOUTH SURGERY Right 35/36/1443   NISSEN FUNDOPLICATION     ROTATOR CUFF REPAIR Left    rt knee surgery     scope   UPPER GASTROINTESTINAL ENDOSCOPY      Family History  Problem Relation Age of Onset   Depression Mother    Kidney cancer Mother    Dementia Mother        Unspecified, likely secondary to other medical condition (cancer)   Depression Father    Hiatal hernia Father    COPD Sister    Dementia Sister        Unspecified, likely secondary to other medical condition   Nephrolithiasis Other    Colon cancer Neg Hx    Esophageal cancer Neg Hx    Pancreatic cancer Neg Hx    Rectal cancer Neg Hx    Stomach cancer Neg Hx     Social History   Socioeconomic History   Marital status: Married    Spouse name: Not on file   Number of children: 1   Years of education: 15   Highest education level: Some college, no degree  Occupational History   Occupation: Retired  Tobacco Use   Smoking status: Never   Smokeless tobacco: Never  Vaping Use   Vaping Use: Never used  Substance and Sexual Activity   Alcohol use: No    Alcohol/week: 0.0 standard drinks of alcohol   Drug use: No   Sexual activity: Not on file  Other Topics Concern   Not on file  Social History Narrative   Unemployed   Married   [place] of 2    Has grandchildren out of state   No pets   Sis. died December 02, 2009 COPD      Right handed   Social Determinants of Health   Financial Resource Strain: Low Risk  (08/18/2020)   Overall Financial Resource Strain (CARDIA)    Difficulty of Paying Living Expenses: Not hard at all  Food Insecurity: No Food Insecurity (08/18/2020)   Hunger Vital Sign    Worried About Running Out of Food  in the Last Year: Never true    Ran Out of Food in the Last Year: Never true  Transportation Needs: No Transportation Needs (08/18/2020)   PRAPARE - Hydrologist (Medical): No    Lack of Transportation (Non-Medical): No  Physical Activity: Inactive (08/18/2020)   Exercise Vital Sign    Days of Exercise per Week: 0 days    Minutes of Exercise per Session: 0 min  Stress: No Stress Concern Present (08/28/2019)   [place]  Feeling of Stress : Not at all  Social Connections: Moderately Integrated (08/18/2020)   Social Connection and Isolation Panel [NHANES]    Frequency of Communication with Friends and Family: Twice a week    Frequency of Social Gatherings with Friends and Family: Twice a week    Attends Religious Services: 1 to 4 times per year    Active Member of [employer] or Organizations: No    Attends Archivist Meetings: Never    Marital Status: Married  Human resources officer Violence: Not At Risk (08/18/2020)   Humiliation, Afraid, Rape, and Kick questionnaire    Fear of Current or Ex-Partner: No    Emotionally Abused: No    Physically Abused: No    Sexually Abused: No    Review of Systems  HENT: Negative.    Eyes: Negative.   Respiratory:  Positive for apnea and shortness of breath.   Cardiovascular: Negative.   Gastrointestinal: Negative.   Endocrine: Negative.   Musculoskeletal:  Positive for arthralgias and myalgias.  Psychiatric/Behavioral:  Positive for sleep disturbance.     Vitals:   08/02/21 1436  BP: 118/68  Pulse: 66  Temp: 98.1 F (36.7 C)  SpO2: 98%     Physical Exam Constitutional:      Appearance: She is well-developed. She is obese.  HENT:     Head: Normocephalic and atraumatic.     Mouth/Throat:     Mouth: Mucous membranes are moist.  Eyes:     General:        Right eye: No discharge.        Left eye: No discharge.     Conjunctiva/sclera:  Conjunctivae normal.     Pupils: Pupils are equal, round, and reactive to light.  Neck:     Thyroid: No thyromegaly.  Cardiovascular:     Rate and Rhythm: Normal rate.  Pulmonary:     Effort: Pulmonary effort is normal.     Breath sounds: Normal breath sounds.  Musculoskeletal:        General: Normal range of motion.     Cervical back: Normal range of motion and neck supple.  Neurological:     Mental Status: She is alert.  Psychiatric:        Mood and Affect: Mood normal.      03/15/2018    1:00 PM 02/13/2018   11:00 AM 09/25/2017    3:00 PM  Results of the Epworth flowsheet  Sitting and reading 0 0 0  Watching TV '3 3 1  '$ Sitting, inactive in a public place (e.g. a theatre or a meeting) 0 0 0  As a passenger in a car for an hour without a break 0 0 0  Lying down to rest in the afternoon when circumstances permit '3 3 2  '$ Sitting and talking to someone 0 0 0  Sitting quietly after a lunch without alcohol 0 0 0  In a car, while stopped for a few minutes in traffic 0 0 0  Total score '6 6 3   '$ Download from the machine reveals she is on IPAP of 14, EPAP of 10 Uses it nightly, about 8 hours of sleep Average on days use of 8 hours 22 minutes Residual AHI remains high at 11.5  Assessment:  Obstructive sleep apnea for which she is on pressure of 14/10  Nonrestorative sleep  Excessive daytime sleepiness/fatigue  Continues to have mask fit issues   Plan/Recommendations:  Continue BiPAP 14/10  We will reach out to DME company  to try different mask  Activity as tolerated  May need to adjust pressures higher if AHI still high but I think the primary problem is still to get the right fitting mask  Follow-up in 3 months  Sherrilyn Rist MD [facility] 08/02/2021, 2:48 PM  CC: Panosh, Standley Brooking, MD

## 2021-08-25 ENCOUNTER — Ambulatory Visit (INDEPENDENT_AMBULATORY_CARE_PROVIDER_SITE_OTHER): Payer: Medicare Other

## 2021-08-25 VITALS — Ht <= 58 in | Wt 144.0 lb

## 2021-08-25 DIAGNOSIS — Z0000 Encounter for general adult medical examination without abnormal findings: Secondary | ICD-10-CM | POA: Diagnosis not present

## 2021-08-25 NOTE — Patient Instructions (Addendum)
[name] , Thank you for taking time to come for your Medicare Wellness Visit. I appreciate your ongoing commitment to your health goals. Please review the following plan we discussed and let me know if I can assist you in the future.   These are the goals we discussed:  Goals       Exercise 150 minutes per week (moderate activity)      Walking longer distances would assist you to go [place] with your grandson IBS is limiting; has a fitness center in the neighborhood  Will try to go x2 days       Patient Stated      Would like to be able to get outside more       Stay Healthy (pt-stated)      To be stronger.         This is a list of the screening recommended for you and due dates:  Health Maintenance  Topic Date Due   Flu Shot  08/16/2021   COVID-19 Vaccine (5 - Pfizer risk series) 09/10/2021*   Mammogram  10/25/2021   Tetanus Vaccine  10/26/2021   Colon Cancer Screening  10/05/2026   Pneumonia Vaccine  Completed   DEXA scan (bone density measurement)  Completed   Hepatitis C Screening: USPSTF Recommendation to screen - Ages 7-79 yo.  Completed   Zoster (Shingles) Vaccine  Completed   HPV Vaccine  Aged Out  *Topic was postponed. The date shown is not the original due date.   Advanced directives: Yes  Conditions/risks identified: None  Next appointment: Follow up in one year for your annual wellness visit     Preventive Care 65 Years and Older, Female Preventive care refers to lifestyle choices and visits with your health care provider that can promote health and wellness. What does preventive care include? A yearly physical exam. This is also called an annual well check. Dental exams once or twice a year. Routine eye exams. Ask your health care provider how often you should have your eyes checked. Personal lifestyle choices, including: Daily care of your teeth and gums. Regular physical activity. Eating a healthy diet. Avoiding tobacco and drug  use. Limiting alcohol use. Practicing safe sex. Taking low-dose aspirin every day. Taking vitamin and mineral supplements as recommended by your health care provider. What happens during an annual well check? The services and screenings done by your health care provider during your annual well check will depend on your age, overall health, lifestyle risk factors, and family history of disease. Counseling  Your health care provider may ask you questions about your: Alcohol use. Tobacco use. Drug use. Emotional well-being. Home and relationship well-being. Sexual activity. Eating habits. History of falls. Memory and ability to understand (cognition). Work and work Statistician. Reproductive health. Screening  You may have the following tests or measurements: Height, weight, and BMI. Blood pressure. Lipid and cholesterol levels. These may be checked every 5 years, or more frequently if you are over 47 years old. Skin check. Lung cancer screening. You may have this screening every year starting at age 54 if you have a 30-pack-year history of smoking and currently smoke or have quit within the past 15 years. Fecal occult blood test (FOBT) of the stool. You may have this test every year starting at age 55. Flexible sigmoidoscopy or colonoscopy. You may have a sigmoidoscopy every 5 years or a colonoscopy every 10 years starting at age 67. Hepatitis C blood test. Hepatitis B blood test. Sexually  transmitted disease (STD) testing. Diabetes screening. This is done by checking your blood sugar (glucose) after you have not eaten for a while (fasting). You may have this done every 1-3 years. Bone density scan. This is done to screen for osteoporosis. You may have this done starting at age 38. Mammogram. This may be done every 1-2 years. Talk to your health care provider about how often you should have regular mammograms. Talk with your health care provider about your test results, treatment  options, and if necessary, the need for more tests. Vaccines  Your health care provider may recommend certain vaccines, such as: Influenza vaccine. This is recommended every year. Tetanus, diphtheria, and acellular pertussis (Tdap, Td) vaccine. You may need a Td booster every 10 years. Zoster vaccine. You may need this after age 72. Pneumococcal 13-valent conjugate (PCV13) vaccine. One dose is recommended after age 43. Pneumococcal polysaccharide (PPSV23) vaccine. One dose is recommended after age 70. Talk to your health care provider about which screenings and vaccines you need and how often you need them. This information is not intended to replace advice given to you by your health care provider. Make sure you discuss any questions you have with your health care provider. Document Released: 01/29/2015 Document Revised: 09/22/2015 Document Reviewed: 11/03/2014 Elsevier Interactive Patient Education  2017 [place] Prevention in the Home Falls can cause injuries. They can happen to people of all ages. There are many things you can do to make your home safe and to help prevent falls. What can I do on the outside of my home? Regularly fix the edges of walkways and driveways and fix any cracks. Remove anything that might make you trip as you walk through a door, such as a raised step or threshold. Trim any bushes or trees on the path to your home. Use bright outdoor lighting. Clear any walking paths of anything that might make someone trip, such as rocks or tools. Regularly check to see if handrails are loose or broken. Make sure that both sides of any steps have handrails. Any raised decks and porches should have guardrails on the edges. Have any leaves, snow, or ice cleared regularly. Use sand or salt on walking paths during winter. Clean up any spills in your garage right away. This includes oil or grease spills. What can I do in the bathroom? Use night lights. Install grab  bars by the toilet and in the tub and shower. Do not use towel bars as grab bars. Use non-skid mats or decals in the tub or shower. If you need to sit down in the shower, use a plastic, non-slip stool. Keep the floor dry. Clean up any water that spills on the floor as soon as it happens. Remove soap buildup in the tub or shower regularly. Attach bath mats securely with double-sided non-slip rug tape. Do not have throw rugs and other things on the floor that can make you trip. What can I do in the bedroom? Use night lights. Make sure that you have a light by your bed that is easy to reach. Do not use any sheets or blankets that are too big for your bed. They should not hang down onto the floor. Have a firm chair that has side arms. You can use this for support while you get dressed. Do not have throw rugs and other things on the floor that can make you trip. What can I do in the kitchen? Clean up any spills right away. Avoid  walking on wet floors. Keep items that you use a lot in easy-to-reach places. If you need to reach something above you, use a strong step stool that has a grab bar. Keep electrical cords out of the way. Do not use floor polish or wax that makes floors slippery. If you must use wax, use non-skid floor wax. Do not have throw rugs and other things on the floor that can make you trip. What can I do with my stairs? Do not leave any items on the stairs. Make sure that there are handrails on both sides of the stairs and use them. Fix handrails that are broken or loose. Make sure that handrails are as long as the stairways. Check any carpeting to make sure that it is firmly attached to the stairs. Fix any carpet that is loose or worn. Avoid having throw rugs at the top or bottom of the stairs. If you do have throw rugs, attach them to the floor with carpet tape. Make sure that you have a light switch at the top of the stairs and the bottom of the stairs. If you do not have them,  ask someone to add them for you. What else can I do to help prevent falls? Wear shoes that: Do not have high heels. Have rubber bottoms. Are comfortable and fit you well. Are closed at the toe. Do not wear sandals. If you use a stepladder: Make sure that it is fully opened. Do not climb a closed stepladder. Make sure that both sides of the stepladder are locked into place. Ask someone to hold it for you, if possible. Clearly mark and make sure that you can see: Any grab bars or handrails. First and last steps. Where the edge of each step is. Use tools that help you move around (mobility aids) if they are needed. These include: Canes. Walkers. Scooters. Crutches. Turn on the lights when you go into a dark area. Replace any light bulbs as soon as they burn out. Set up your furniture so you have a clear path. Avoid moving your furniture around. If any of your floors are uneven, fix them. If there are any pets around you, be aware of where they are. Review your medicines with your doctor. Some medicines can make you feel dizzy. This can increase your chance of falling. Ask your doctor what other things that you can do to help prevent falls. This information is not intended to replace advice given to you by your health care provider. Make sure you discuss any questions you have with your health care provider. Document Released: 10/29/2008 Document Revised: 06/10/2015 Document Reviewed: 02/06/2014 Elsevier Interactive Patient Education  2017 [employer].

## 2021-08-25 NOTE — Progress Notes (Signed)
Subjective:   Caitlin [name] is a 76 y.o. female who presents for Medicare Annual (Subsequent) preventive examination.  Review of Systems    Virtual Visit via Telephone Note  I connected with  Caitlin [name] on 08/25/21 at 11:30 AM EDT by telephone and verified that I am speaking with the correct person using two identifiers.  Location: Patient: Home Provider: Office Persons participating in the virtual visit: patient/Nurse Health Advisor   I discussed the limitations, risks, security and privacy concerns of performing an evaluation and management service by telephone and the availability of in person appointments. The patient expressed understanding and agreed to proceed.  Interactive audio and video telecommunications were attempted between this nurse and patient, however failed, due to patient having technical difficulties OR patient did not have access to video capability.  We continued and completed visit with audio only.  Some vital signs may be absent or patient reported.   Criselda Peaches, LPN  Cardiac Risk Factors include: advanced age (>27mn, >>90women);hypertension     Objective:    Today's Vitals   08/25/21 1133  Weight: 144 lb (65.3 kg)  Height: '4\' 10"'$  (1.473 m)   Body mass index is 30.1 kg/m.     08/25/2021   11:42 AM 01/24/2021    8:33 PM 01/19/2021    2:48 PM 12/07/2020    3:12 PM 08/18/2020    2:50 PM 03/24/2020    3:09 PM 08/28/2019    3:41 PM  Advanced Directives  Does Patient Have a Medical Advance Directive? Yes Yes Yes Yes Yes Yes Yes  Type of AP[place]amed[place]of [place] will Healthcare Power of [place] will [place] will;Out of facility DNR (pink MOST or yellow form) [place]Living will Healthcare Power of [place]Living will  Does patient want to make changes to medical advance directive? No - Patient declined       No - Patient declined  Copy of [place] Chart? No - copy requested    No - copy requested  Yes - validated most recent copy scanned in chart (See row information)    Current Medications (verified) Outpatient Encounter Medications as of 08/25/2021  Medication Sig   Cyanocobalamin (VITAMIN B-12) 5000 MCG TBDP Place 5,000 mcg under the tongue daily.   dicyclomine (BENTYL) 10 MG capsule TAKE 1 CAPSULE BY MOUTH EVERY 8 HOURS AS NEEDED FOR SPASM   escitalopram (LEXAPRO) 10 MG tablet Take 1.5 tablets (15 mg total) by mouth daily. Dosage change   esomeprazole (NEXIUM) 20 MG capsule Take 20 mg by mouth daily at 12 noon.   hydrochlorothiazide (HYDRODIURIL) 12.5 MG tablet Take 1 tablet (12.5 mg total) by mouth daily.   potassium chloride (KLOR-CON) 10 MEQ tablet TAKE 1 TABLET BY MOUTH EVERY OTHER DAY WITH FLUID TABLET . APPOINTMENT REQUIRED FOR FUTURE REFILLS   No facility-administered encounter medications on file as of 08/25/2021.    Allergies (verified) Codeine phosphate, Mobic [meloxicam], Protonix [pantoprazole sodium], Aleve [naproxen], Colestipol hcl, Omeprazole-sodium bicarbonate, Other, Sodium, and Sulfamethoxazole   History: Past Medical History:  Diagnosis Date   Allergic rhinitis 07/11/2006   Back pain 10/09/2007   Blood transfusion without reported diagnosis    Diaphragmatic hernia 07/11/2006   Diverticulosis of colon    Eczema 07/11/2006   Elevated cholesterol 04/29/2012   Enteritis    Erythema nodosum    Tends to be seasonal has had a negative  chest x-ray in the past negative PPD   Essential hypertension 07/11/2006   Fatigue 03/12/2007   Fibromyalgia    Gastroparesis 12/23/2008   Generalized anxiety disorder 09/01/2014   GERD (gastroesophageal reflux disease)    History of stricture and dilatation and Nissen surgery   Hearing loss, left ear 11/27/2008   History of cataract    bilateral; s/p surgery   Hyperglycemia 08/15/2006   Irritable bowel  syndrome 12/23/2008   Leg cramps, nocturnal 11/11/2007   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   MCI (mild cognitive impairment) 10/22/2018   Obstructive sleep apnea 04/23/2007   uses CPAP nightly; mask has leak   Osteoarthritis 11/28/2011   Osteopenia    Prediabetes 04/19/2011   Vitamin B12 deficiency 05/13/2010   Past Surgical History:  Procedure Laterality Date   ABDOMINAL HYSTERECTOMY     bone spur     CATARACT EXTRACTION W/ INTRAOCULAR LENS  IMPLANT, BILATERAL     CESAREAN SECTION     x 1   CHOLECYSTECTOMY     COLONOSCOPY     DENTAL SURGERY     ESOPHAGOGASTRODUODENOSCOPY     stricture and dilatation   left shoulder surgery  06/08, 07/12/17   x 2   MOUTH SURGERY Right 65/68/1275   NISSEN FUNDOPLICATION     ROTATOR CUFF REPAIR Left    rt knee surgery     scope   UPPER GASTROINTESTINAL ENDOSCOPY     Family History  Problem Relation Age of Onset   Depression Mother    Kidney cancer Mother    Dementia Mother        Unspecified, likely secondary to other medical condition (cancer)   Depression Father    Hiatal hernia Father    COPD Sister    Dementia Sister        Unspecified, likely secondary to other medical condition   Nephrolithiasis Other    Colon cancer Neg Hx    Esophageal cancer Neg Hx    Pancreatic cancer Neg Hx    Rectal cancer Neg Hx    Stomach cancer Neg Hx    Social History   Socioeconomic History   Marital status: Married    Spouse name: Not on file   Number of children: 1   Years of education: 15   Highest education level: Some college, no degree  Occupational History   Occupation: Retired  Tobacco Use   Smoking status: Never   Smokeless tobacco: Never  Vaping Use   Vaping Use: Never used  Substance and Sexual Activity   Alcohol use: No    Alcohol/week: 0.0 standard drinks of alcohol   Drug use: No   Sexual activity: Not on file  Other Topics Concern   Not on file  Social History Narrative   Unemployed   Married   [place] of 2    Has  grandchildren out of state   No pets   Sis. died December 14, 2009 COPD      Right handed   Social Determinants of Health   Financial Resource Strain: Low Risk  (08/25/2021)   Overall Financial Resource Strain (CARDIA)    Difficulty of Paying Living Expenses: Not hard at all  Food Insecurity: No Food Insecurity (08/25/2021)   Hunger Vital Sign    Worried About Running Out of Food in the Last Year: Never true    Ran Out of Food in the Last Year: Never true  Transportation Needs: No Transportation Needs (08/25/2021)   PRAPARE - Transportation  Lack of Transportation (Medical): No    Lack of Transportation (Non-Medical): No  Physical Activity: Inactive (08/25/2021)   Exercise Vital Sign    Days of Exercise per Week: 0 days    Minutes of Exercise per Session: 0 min  Stress: No Stress Concern Present (08/25/2021)   [place]    Feeling of Stress : Not at all  Social Connections: [place] (08/25/2021)   Social Connection and Isolation Panel [NHANES]    Frequency of Communication with Friends and Family: More than three times a week    Frequency of Social Gatherings with Friends and Family: More than three times a week    Attends Religious Services: More than 4 times per year    Active Member of [employer] or Organizations: Yes    Attends Music therapist: More than 4 times per year    Marital Status: Married    Tobacco Counseling Counseling given: Not Answered   Clinical Intake:  Pre-visit preparation completed: No  Pain : No/denies pain     BMI - recorded: 30.73 Nutritional Status: BMI > 30  Obese Nutritional Risks: None Diabetes: No  How often do you need to have someone help you when you read instructions, pamphlets, or other written materials from your doctor or pharmacy?: 1 - Never  Diabetic?  No  Interpreter Needed?: No  Information entered by :: Rolene Arbour LPN   Activities  of Daily Living    08/25/2021   11:40 AM  In your present state of health, do you have any difficulty performing the following activities:  Hearing? 0  Vision? 0  Difficulty concentrating or making decisions? 0  Walking or climbing stairs? 0  Dressing or bathing? 0  Doing errands, shopping? 0  Preparing Food and eating ? N  Using the Toilet? N  In the past six months, have you accidently leaked urine? N  Do you have problems with loss of bowel control? N  Comment Dx IBS  Managing your Medications? N  Managing your Finances? N  Housekeeping or managing your Housekeeping? N    Patient Care Team: Panosh, Standley Brooking, MD as PCP - Kennon Portela, MD as Consulting Physician (Dermatology) Monna Fam, MD as Attending Physician (Ophthalmology) Sydnee Levans, MD as Consulting Physician (Dermatology) Cameron Sprang, MD as Consulting Physician (Neurology) Laurin Coder, MD as Consulting Physician (Pulmonary Disease)  Indicate any recent Medical Services you may have received from other than [facility] providers in the past year (date may be approximate).     Assessment:   This is a routine wellness examination for [place].  Hearing/Vision screen Hearing Screening - Comments:: No hearing difficulty Vision Screening - Comments:: Wears glasses. Followed by Dr Herbert Deaner  Dietary issues and exercise activities discussed: Exercise limited by: None identified   Goals Addressed               This Visit's Progress     Stay Healthy (pt-stated)        To be stronger.        Depression Screen    08/25/2021   11:37 AM 05/24/2021    2:18 PM 08/18/2020    2:51 PM 08/18/2020    2:48 PM 08/28/2019    3:43 PM 10/23/2017    2:40 PM 04/17/2017    1:16 PM  PHQ 2/9 Scores  PHQ - 2 Score 0 6 0 0 '1 6 6  '$ PHQ- 9 Score  23  7 21     Fall Risk    08/25/2021   11:41 AM 06/06/2021    2:59 PM 05/24/2021    2:11 PM 12/07/2020    3:12 PM 08/18/2020    2:51 PM  Fall Risk   Falls in the past  year? 0 1 1 0 0  Number falls in past yr: 0 0 0 0 0  Injury with Fall? 0 1 1 0 0  Risk for fall due to : No Fall Risks No Fall Risks No Fall Risks    Follow up  Falls evaluation completed Falls evaluation completed  Falls evaluation completed    [place]:  Any stairs in or around the home? No  If so, are there any without handrails? No  Home free of loose throw rugs in walkways, pet beds, electrical cords, etc? Yes  Adequate lighting in your home to reduce risk of falls? Yes   ASSISTIVE DEVICES UTILIZED TO PREVENT FALLS:  Life alert? No  Use of a cane, walker or w/c? No  Grab bars in the bathroom? Yes  Shower chair or bench in shower? Yes  Elevated toilet seat or a handicapped toilet? No   TIMED UP AND GO:  Was the test performed? No . Audio Visit  Cognitive Function:    04/04/2020    6:00 PM 03/12/2018    3:00 PM 03/12/2017    3:00 PM 03/10/2016    3:00 PM 03/08/2015    3:22 PM  MMSE - Mini Mental State Exam  Orientation to time '5 5 5 5 5  '$ Orientation to Place '5 5 5 5 5  '$ Registration '3 3 3 3 3  '$ Attention/ Calculation '3 3 3 4 5  '$ Recall '3 3 3 3 3  '$ Language- name 2 objects '2 2 2 2 2  '$ Language- repeat '1 1 1 1 1  '$ Language- follow 3 step command '3 3 3 3 3  '$ Language- read & follow direction '1 1 1 1 1  '$ Write a sentence '1 1 1 1 1  '$ Copy design '1 1 1 1 1  '$ Total score '28 28 28 29 30        '$ 08/25/2021   11:42 AM 08/28/2019    3:50 PM  6CIT Screen  What Year? 0 points 0 points  What month? 0 points 0 points  What time? 0 points 0 points  Count back from 20 0 points 0 points  Months in reverse 0 points 0 points  Repeat phrase 0 points 0 points  Total Score 0 points 0 points    Immunizations Immunization History  Administered Date(s) Administered   Fluad Quad(high Dose 65+) 10/30/2020   H1N1 03/09/2008   Influenza Split 11/08/2010, 10/17/2011   Influenza Whole 11/14/2006, 10/09/2007, 10/21/2008, 10/19/2009   Influenza, High Dose  Seasonal PF 10/06/2014, 10/01/2015, 10/05/2016, 10/12/2017, 10/03/2018   Influenza,inj,Quad PF,6+ Mos 10/11/2012, 10/14/2013   Influenza-Unspecified 10/01/2018   PFIZER(Purple Top)SARS-COV-2 Vaccination 03/01/2019, 03/24/2019, 06/21/2020   PPD Test 06/05/2012   Pfizer Covid-19 Vaccine Bivalent Booster 64yr & up 10/19/2020   Pneumococcal Conjugate-13 11/05/2012   Pneumococcal Polysaccharide-23 03/09/2008, 10/06/2014   Tdap 10/27/2011   Zoster Recombinat (Shingrix) 10/17/2017, 01/02/2018   Zoster, Live 08/08/2010    TDAP status: Up to date  Flu Vaccine status: Up to date  Pneumococcal vaccine status: Up to date  Covid-19 vaccine status: Completed vaccines  Qualifies for Shingles Vaccine? Yes   Zostavax completed Yes   Shingrix Completed?: Yes  Screening  Tests Health Maintenance  Topic Date Due   INFLUENZA VACCINE  08/16/2021   COVID-19 Vaccine (5 - Pfizer risk series) 09/10/2021 (Originally 12/14/2020)   MAMMOGRAM  10/25/2021   TETANUS/TDAP  10/26/2021   COLONOSCOPY (Pts 45-71yr Insurance coverage will need to be confirmed)  10/05/2026   Pneumonia Vaccine 75 Years old  Completed   DEXA SCAN  Completed   Hepatitis C Screening  Completed   Zoster Vaccines- Shingrix  Completed   HPV VACCINES  Aged Out    Health Maintenance  Health Maintenance Due  Topic Date Due   INFLUENZA VACCINE  08/16/2021    Colorectal cancer screening: Type of screening: Colonoscopy. Completed 10/04/16. Repeat every 10 years  Mammogram status: Completed 10/25/20. Repeat every year  Bone Density status: Completed 12/27/17. Results reflect: Bone density results: OSTEOPOROSIS. Repeat every   years.  Lung Cancer Screening: (Low Dose CT Chest recommended if Age 75-80years, 30 pack-year currently smoking OR have quit w/in 15years.)  qualify.     Additional Screening:  Hepatitis C Screening: does qualify; Completed 11/05/07  Vision Screening: Recommended annual ophthalmology exams for early  detection of glaucoma and other disorders of the eye. Is the patient up to date with their annual eye exam?  Yes  Who is the provider or what is the name of the office in which the patient attends annual eye exams? Dr HHerbert DeanerIf pt is not established with a provider, would they like to be referred to a provider to establish care? No .   Dental Screening: Recommended annual dental exams for proper oral hygiene  Community Resource Referral / Chronic Care Management:  CRR required this visit?  No   CCM required this visit?  No      Plan:     I have personally reviewed and noted the following in the patient's chart:   Medical and social history Use of alcohol, tobacco or illicit drugs  Current medications and supplements including opioid prescriptions.  Functional ability and status Nutritional status Physical activity Advanced directives List of other physicians Hospitalizations, surgeries, and ER visits in previous 12 months Vitals Screenings to include cognitive, depression, and falls Referrals and appointments  In addition, I have reviewed and discussed with patient certain preventive protocols, quality metrics, and best practice recommendations. A written personalized care plan for preventive services as well as general preventive health recommendations were provided to patient.     BCriselda Peaches LPN   80/27/7412  Nurse Notes: None

## 2021-09-10 ENCOUNTER — Other Ambulatory Visit: Payer: Self-pay | Admitting: Gastroenterology

## 2021-09-16 ENCOUNTER — Other Ambulatory Visit: Payer: Self-pay | Admitting: Gastroenterology

## 2021-09-16 NOTE — Telephone Encounter (Signed)
Pt needs rx dicycolmine refilled. Thank you

## 2021-09-19 ENCOUNTER — Other Ambulatory Visit: Payer: Self-pay | Admitting: Internal Medicine

## 2021-09-21 ENCOUNTER — Other Ambulatory Visit: Payer: Self-pay | Admitting: Gastroenterology

## 2021-09-21 ENCOUNTER — Telehealth: Payer: Self-pay | Admitting: Pulmonary Disease

## 2021-09-21 DIAGNOSIS — G4733 Obstructive sleep apnea (adult) (pediatric): Secondary | ICD-10-CM

## 2021-09-23 NOTE — Telephone Encounter (Signed)
I called and spoke with the pt  She states that her BIPAP machine stops recording in the night  Also needing new mask  Referral sent to [facility]  Nothing further needed

## 2021-10-14 ENCOUNTER — Other Ambulatory Visit: Payer: Self-pay | Admitting: Internal Medicine

## 2021-10-18 ENCOUNTER — Other Ambulatory Visit: Payer: Self-pay | Admitting: Internal Medicine

## 2021-10-18 NOTE — Telephone Encounter (Signed)
Pt is out of medication

## 2021-10-19 LAB — HM DIABETES EYE EXAM

## 2021-10-19 NOTE — Progress Notes (Unsigned)
10/24/2021 QUINTERIA CHISUM [id number] 13-Mar-1945  Referring provider: Burnis Medin, MD Primary GI doctor: Dr. Silverio Decamp  ASSESSMENT AND PLAN:   Irritable bowel syndrome with diarrhea, AB cramping.  Prolonged history of diarrhea  10/04/2016: Colonoscopy Diverticulosis. Internal hemorrhoids. recall colonoscopy 10 years. No biopsies taken.  States it is affecting quality of life at this time.  Unable to tolerate bile acid sequestrant due to EM - - stool samples to rule out infection  -Fecal calprotectin and CRP/ESR to rule out inflammation  -Pancreatic elastase and fecal fat  -TTG/IGA to evaluate for celiac disease.  - Discussed FODMAP diet, avoid lactose.  - Add IBGARD, refilled dicyclomine, can do liquid imodium - Will discuss possible flex sig versus colonoscopy with Dr. Silverio Decamp to evaluate for microscopic colitis with history of nocturnal symptoms.  Unknown if completed trial of xifaxin, patient states does not recall if she took it or effect- can consider another trial or testing  Gastroesophageal reflux disease, unspecified whether esophagitis present S/p nissen fundoplication Lifestyle changes discussed, avoid NSAIDS, ETOH Continue on the same medication, reports symptoms are well controlled.    History of Present Illness:  76 y.o. female  with a past medical history of anxiety, hyperlipidemia, B12 deficiency, gastroparesis listed on medical history 2010, OSA GERD, status post cholecystectomy, status post [employer] fundoplication, diverticulosis, IBS predominant diarrhea and others listed below, returns to clinic today for refill of dicyclomine   10/04/2016: Colonoscopy Diverticulosis. Internal hemorrhoids. recall colonoscopy 10 years. No biopsies taken.  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   Patient  treated with Xifaxan 2018 and 2021, does not recall taking it or effect.  Given trial of colestipol 2019 but discontinued due to erythema multiforme. Patient is sleepy with dicyclomine so does not take it it during the day, only one at night and very rare during the day. States no matter what she eats, will have diarrhea about 20-30 mins after. Has upper AB bloating.  Semi loose spaghetti, 2-3 times a day, then 1-2 days without bowel movement.  Will still like she needs to have a BM but unable to on days she does not have BM. She will have night time symptoms and be woken up with diarrhea at least once a a month.   She has urgency with BM's and has come close to fecal incontinence but has not had it..  No blood in the stool. No new medications. No fever, chills. No weight loss.  She has significant bloating with AB discomfort, not better after BM.  Imodium helps stop diarrhea.   States GERd is well controlled at this time.  No dysphagia.  Has some darker stools when she takes pepto.   She  reports that she has never smoked. She has never used smokeless tobacco. She reports that she does not drink alcohol and does not use drugs. Her family history includes COPD in her sister; Dementia in her mother and sister; Depression in her father and mother; Hiatal hernia in her father; Kidney cancer in her mother; Nephrolithiasis in an other family member.   Current Medications:    Current Outpatient Medications (Cardiovascular):    hydrochlorothiazide (HYDRODIURIL) 12.5 MG tablet, Take 1 tablet (12.5 mg total) by mouth daily.    Current Outpatient Medications (Hematological):    Cyanocobalamin (VITAMIN B-12) 5000 MCG TBDP, Place 5,000 mcg under the tongue daily.  Current Outpatient Medications (Other):    dicyclomine (BENTYL) 10 MG capsule, Take 1 capsule (10 mg total) by mouth every 8 (eight) hours as needed for spasms. Please schedule an office visit for further refills. Thank you!    escitalopram (LEXAPRO) 10 MG tablet, TAKE 1 & 1/2 (ONE & ONE-HALF) TABLETS BY MOUTH ONCE DAILY   esomeprazole (NEXIUM) 20 MG capsule, Take 20 mg by mouth daily at 12 noon.   potassium chloride (KLOR-CON) 10 MEQ tablet, TAKE 1 TABLET BY MOUTH EVERY OTHER DAY WITH  FLUID  TABLET   SODIUM FLUORIDE 5000 SENSITIVE 1.1-5 % GEL, Take by mouth.  Surgical History:  She  has a past surgical history that includes Cholecystectomy; Esophagogastroduodenoscopy; left shoulder surgery (06/08, 07/12/17); rt knee surgery; Abdominal hysterectomy; Cesarean section; Nissen fundoplication; Rotator cuff repair (Left); bone spur; Upper gastrointestinal endoscopy; Colonoscopy; Cataract extraction w/ intraocular lens  implant, bilateral; Mouth surgery (Right, 06/03/2019); and Dental surgery.  Current Medications, Allergies, Past Medical History, Past Surgical History, Family History and Social History were reviewed in [employer] record.  Physical Exam: BP 128/80   Pulse 68   Ht _0  (1.422 m)   Wt 144 lb 12.8 oz (65.7 kg)   SpO2 95%   BMI 32.46 kg/m  General:   Pleasant, well developed female in no acute distress Heart : Regular rate and rhythm; no murmurs Pulm: Clear anteriorly; no wheezing Abdomen:  Soft, Obese AB, Active bowel sounds. No tenderness . Without guarding and Without rebound, No organomegaly appreciated. Rectal: Not evaluated Extremities:  without  edema. Neurologic:  Alert and  oriented x4;  No focal deficits.  Psych:  Cooperative. Normal mood and affect.   Vladimir Crofts, PA-C 10/24/21

## 2021-10-20 ENCOUNTER — Other Ambulatory Visit: Payer: Self-pay | Admitting: Internal Medicine

## 2021-10-20 DIAGNOSIS — Z1231 Encounter for screening mammogram for malignant neoplasm of breast: Secondary | ICD-10-CM

## 2021-10-24 ENCOUNTER — Ambulatory Visit: Payer: Medicare Other | Admitting: Physician Assistant

## 2021-10-24 ENCOUNTER — Encounter: Payer: Self-pay | Admitting: Physician Assistant

## 2021-10-24 VITALS — BP 128/80 | HR 68 | Ht <= 58 in | Wt 144.8 lb

## 2021-10-24 DIAGNOSIS — K219 Gastro-esophageal reflux disease without esophagitis: Secondary | ICD-10-CM | POA: Diagnosis not present

## 2021-10-24 DIAGNOSIS — K591 Functional diarrhea: Secondary | ICD-10-CM | POA: Diagnosis not present

## 2021-10-24 DIAGNOSIS — R109 Unspecified abdominal pain: Secondary | ICD-10-CM

## 2021-10-24 DIAGNOSIS — K580 Irritable bowel syndrome with diarrhea: Secondary | ICD-10-CM

## 2021-10-24 MED ORDER — IBGARD 90 MG PO CPCR: ORAL_CAPSULE | ORAL | 0 refills | Status: DC

## 2021-10-24 MED ORDER — DICYCLOMINE HCL 10 MG PO CAPS: 10.0000 mg | ORAL_CAPSULE | Freq: Three times a day (TID) | ORAL | 2 refills | Status: DC | PRN

## 2021-10-24 NOTE — Patient Instructions (Addendum)
Your provider has requested that you go to the basement level for lab work before leaving today. Press "B" on the elevator. The lab is located at the first door on the left as you exit the elevator.   You have been scheduled for a follow up appointment with Dr. Silverio Decamp on Monday, 01-02-22 at 3:30 pm.  We have sent the following medications to your pharmacy for you to pick up at your convenience: Bentyl   First do a trial off milk/lactose products if you use them.  Add fiber like benefiber or citracel once a day Increase activity Can do trial of IBGard (we are giving you samples today to try)  which is over the counter for AB pain- Take 1-2 capsules once a day for maintence or twice a day during a flare Can do liquid imodium, this you can do a 0.5 mg dose if needed  if any worsening symptoms like blood in stool, weight loss, please call the office   Please try low FODMAP diet- see below- start with eliminating just one column at a time, the table at the very bottom contains foods that are safe to take   FODMAP stands for fermentable oligo-, di-, mono-saccharides and polyols (1). These are the scientific terms used to classify groups of carbs that are notorious for triggering digestive symptoms like bloating, gas and stomach pain.      Thank you for entrusting me with your care and for choosing [facility], Vicie Mutters, P.A.-C

## 2021-10-25 ENCOUNTER — Other Ambulatory Visit (INDEPENDENT_AMBULATORY_CARE_PROVIDER_SITE_OTHER): Payer: Medicare Other

## 2021-10-25 DIAGNOSIS — K580 Irritable bowel syndrome with diarrhea: Secondary | ICD-10-CM | POA: Diagnosis not present

## 2021-10-25 DIAGNOSIS — K591 Functional diarrhea: Secondary | ICD-10-CM | POA: Diagnosis not present

## 2021-10-25 LAB — COMPREHENSIVE METABOLIC PANEL
ALT: 10 U/L (ref 0–35)
AST: 12 U/L (ref 0–37)
Albumin: 4.2 g/dL (ref 3.5–5.2)
Alkaline Phosphatase: 70 U/L (ref 39–117)
BUN: 11 mg/dL (ref 6–23)
CO2: 30 mEq/L (ref 19–32)
Calcium: 9.3 mg/dL (ref 8.4–10.5)
Chloride: 100 mEq/L (ref 96–112)
Creatinine, Ser: 0.74 mg/dL (ref 0.40–1.20)
GFR: 79.01 mL/min (ref >60.00)
Glucose, Bld: 91 mg/dL (ref 70–99)
Potassium: 3.2 mEq/L — ABNORMAL LOW (ref 3.5–5.1)
Sodium: 138 mEq/L (ref 135–145)
Total Bilirubin: 0.7 mg/dL (ref 0.2–1.2)
Total Protein: 6.7 g/dL (ref 6.0–8.3)

## 2021-10-25 LAB — CBC WITH DIFFERENTIAL/PLATELET
Basophils Absolute: 0 10*3/uL (ref 0.0–0.1)
Basophils Relative: 0.3 % (ref 0.0–3.0)
Eosinophils Absolute: 0.2 10*3/uL (ref 0.0–0.7)
Eosinophils Relative: 2.7 % (ref 0.0–5.0)
HCT: 41.5 % (ref 36.0–46.0)
Hemoglobin: 13.8 g/dL (ref 12.0–15.0)
Lymphocytes Relative: 24.9 % (ref 12.0–46.0)
Lymphs Abs: 1.7 10*3/uL (ref 0.7–4.0)
MCHC: 33.1 g/dL (ref 30.0–36.0)
MCV: 87.7 fl (ref 78.0–100.0)
Monocytes Absolute: 0.4 10*3/uL (ref 0.1–1.0)
Monocytes Relative: 6.2 % (ref 3.0–12.0)
Neutro Abs: 4.5 10*3/uL (ref 1.4–7.7)
Neutrophils Relative %: 65.9 % (ref 43.0–77.0)
Platelets: 276 10*3/uL (ref 150.0–400.0)
RBC: 4.74 Mil/uL (ref 3.87–5.11)
RDW: 14.3 % (ref 11.5–15.5)
WBC: 6.9 10*3/uL (ref 4.0–10.5)

## 2021-10-25 LAB — SEDIMENTATION RATE: Sed Rate: 18 mm/hr (ref 0–30)

## 2021-10-26 ENCOUNTER — Telehealth: Payer: Self-pay | Admitting: Physician Assistant

## 2021-10-26 ENCOUNTER — Encounter: Payer: Self-pay | Admitting: Internal Medicine

## 2021-10-26 ENCOUNTER — Telehealth (INDEPENDENT_AMBULATORY_CARE_PROVIDER_SITE_OTHER): Payer: Medicare Other | Admitting: Internal Medicine

## 2021-10-26 ENCOUNTER — Other Ambulatory Visit: Payer: Self-pay

## 2021-10-26 ENCOUNTER — Other Ambulatory Visit (INDEPENDENT_AMBULATORY_CARE_PROVIDER_SITE_OTHER): Payer: Medicare Other

## 2021-10-26 DIAGNOSIS — E876 Hypokalemia: Secondary | ICD-10-CM | POA: Diagnosis not present

## 2021-10-26 DIAGNOSIS — F32A Depression, unspecified: Secondary | ICD-10-CM | POA: Diagnosis not present

## 2021-10-26 DIAGNOSIS — E538 Deficiency of other specified B group vitamins: Secondary | ICD-10-CM

## 2021-10-26 DIAGNOSIS — Z79899 Other long term (current) drug therapy: Secondary | ICD-10-CM

## 2021-10-26 DIAGNOSIS — K591 Functional diarrhea: Secondary | ICD-10-CM

## 2021-10-26 DIAGNOSIS — K580 Irritable bowel syndrome with diarrhea: Secondary | ICD-10-CM

## 2021-10-26 LAB — IGA: Immunoglobulin A: 202 mg/dL (ref 70–320)

## 2021-10-26 LAB — TISSUE TRANSGLUTAMINASE, IGA: (tTG) Ab, IgA: <1 U/mL

## 2021-10-26 LAB — MAGNESIUM: Magnesium: 1.9 mg/dL (ref 1.5–2.5)

## 2021-10-26 LAB — TSH: TSH: 1.94 u[IU]/mL (ref 0.35–5.50)

## 2021-10-26 MED ORDER — BUPROPION HCL ER (XL) 150 MG PO TB24: 150.0000 mg | ORAL_TABLET | Freq: Every day | ORAL | 3 refills | Status: DC

## 2021-10-26 NOTE — Telephone Encounter (Signed)
Inbound call from patient, requesting to speak with a nurse ,she have to do some stools samples and need someone to walk her through .Please advise

## 2021-10-26 NOTE — Progress Notes (Signed)
Virtual Visit via Video Note  I connected with Caitlin [name] on 10/26/21 at  4:00 PM EDT by a video enabled telemedicine application and verified that I am speaking with the correct person using two identifiers. Location patient: home Location provider:work  office Persons participating in the virtual visit: patient, provider   Patient aware  of the limitations of evaluation and management by telemedicine and  availability of in person appointments. and agreed to proceed.hard to get ot appts if has diarrhea    HPI: Caitlin [name] presents for video visit follow-up of medication. Since last visit she has decreased her vitamin B12 to 3 times a week as discussed. In regard to depression Lexapro 15 mg a day she had just about run out and had difficulty getting refills from the office.  So she weaned down to about 5 mg a day for 4 days or so.  Felt a bit jumpy.  Did get the refill last week and is back up to the 15 mg a day.  Denies any significant side effects although wonders if sleepiness is additive to her difficulties with the CPAP.  The Lexapro has helped her depression somewhat but is still ongoing.  Would like some more improvement. She is being evaluated with the GI team for diarrhea which was unexpected and kept her from keeping a GI appointment at the last minute had to cancel. Has not heard about labs yet. Asks about flu shot and COVID booster ROS: See pertinent positives and negatives per HPI.  Past Medical History:  Diagnosis Date   Allergic rhinitis 07/11/2006   Back pain 10/09/2007   Blood transfusion without reported diagnosis    Diaphragmatic hernia 07/11/2006   Diverticulosis of colon    Eczema 07/11/2006   Elevated cholesterol 04/29/2012   Enteritis    Erythema nodosum    Tends to be seasonal has had a negative chest x-ray in the past negative PPD   Essential hypertension 07/11/2006   Fatigue 03/12/2007   Fibromyalgia    Gastroparesis 12/23/2008   Generalized  anxiety disorder 09/01/2014   GERD (gastroesophageal reflux disease)    History of stricture and dilatation and Nissen surgery   Hearing loss, left ear 11/27/2008   History of cataract    bilateral; s/p surgery   Hyperglycemia 08/15/2006   Irritable bowel syndrome 12/23/2008   Leg cramps, nocturnal 11/11/2007   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   MCI (mild cognitive impairment) 10/22/2018   Obstructive sleep apnea 04/23/2007   uses CPAP nightly; mask has leak   Osteoarthritis 11/28/2011   Osteopenia    Prediabetes 04/19/2011   Vitamin B12 deficiency 05/13/2010    Past Surgical History:  Procedure Laterality Date   ABDOMINAL HYSTERECTOMY     bone spur     CATARACT EXTRACTION W/ INTRAOCULAR LENS  IMPLANT, BILATERAL     CESAREAN SECTION     x 1   CHOLECYSTECTOMY     COLONOSCOPY     DENTAL SURGERY     ESOPHAGOGASTRODUODENOSCOPY     stricture and dilatation   left shoulder surgery  06/08, 07/12/17   x 2   MOUTH SURGERY Right 09/32/6712   NISSEN FUNDOPLICATION     ROTATOR CUFF REPAIR Left    rt knee surgery     scope   UPPER GASTROINTESTINAL ENDOSCOPY      Family History  Problem Relation Age of Onset   Depression Mother    Kidney cancer Mother    Dementia Mother  Unspecified, likely secondary to other medical condition (cancer)   Depression Father    Hiatal hernia Father    COPD Sister    Dementia Sister        Unspecified, likely secondary to other medical condition   Nephrolithiasis Other    Colon cancer Neg Hx    Esophageal cancer Neg Hx    Pancreatic cancer Neg Hx    Rectal cancer Neg Hx    Stomach cancer Neg Hx     Social History   Tobacco Use   Smoking status: Never   Smokeless tobacco: Never  Vaping Use   Vaping Use: Never used  Substance Use Topics   Alcohol use: No    Alcohol/week: 0.0 standard drinks of alcohol   Drug use: No      Current Outpatient Medications:    buPROPion (WELLBUTRIN XL) 150 MG 24 hr tablet, Take 1 tablet  (150 mg total) by mouth daily., Disp: 30 tablet, Rfl: 3   Cyanocobalamin (VITAMIN B-12) 5000 MCG TBDP, Place 5,000 mcg under the tongue daily., Disp: , Rfl:    dicyclomine (BENTYL) 10 MG capsule, Take 1 capsule (10 mg total) by mouth every 8 (eight) hours as needed for spasms., Disp: 270 capsule, Rfl: 2   escitalopram (LEXAPRO) 10 MG tablet, TAKE 1 & 1/2 (ONE & ONE-HALF) TABLETS BY MOUTH ONCE DAILY, Disp: 135 tablet, Rfl: 0   esomeprazole (NEXIUM) 20 MG capsule, Take 20 mg by mouth daily at 12 noon., Disp: , Rfl:    hydrochlorothiazide (HYDRODIURIL) 12.5 MG tablet, Take 1 tablet (12.5 mg total) by mouth daily., Disp: 90 tablet, Rfl: 1   potassium chloride (KLOR-CON) 10 MEQ tablet, TAKE 1 TABLET BY MOUTH EVERY OTHER DAY WITH  FLUID  TABLET, Disp: 30 tablet, Rfl: 0   Simethicone 125 MG CAPS, Take by mouth. PRN, Disp: , Rfl:    SODIUM FLUORIDE 5000 SENSITIVE 1.1-5 % GEL, Take by mouth., Disp: , Rfl:    Peppermint Oil (IBGARD) 90 MG CPCR, Take as directed (Patient not taking: Reported on 10/26/2021), Disp: 12 capsule, Rfl: 0  EXAM: BP Readings from Last 3 Encounters:  10/24/21 128/80  08/02/21 118/68  05/24/21 128/80    VITALS per patient if applicable:  GENERAL: alert, oriented, appears well and in no acute distress  HEENT: atraumatic, conjunttiva clear, no obvious abnormalities on inspection of external nose and ears  NECK: normal movements of the head and neck  LUNGS: on inspection no signs of respiratory distress, breathing rate appears normal, no obvious gross SOB, gasping or wheezing  CV: no obvious cyanosis  MS: moves all visible extremities without noticeable abnormality  PSYCH/NEURO: pleasant and cooperative, speech and thought processing grossly intact Lab Results  Component Value Date   WBC 6.9 10/25/2021   HGB 13.8 10/25/2021   HCT 41.5 10/25/2021   PLT 276.0 10/25/2021   GLUCOSE 91 10/25/2021   CHOL 164 05/07/2019   TRIG 76.0 05/07/2019   HDL 85.90 05/07/2019    LDLCALC 63 05/07/2019   ALT 10 10/25/2021   AST 12 10/25/2021   NA 138 10/25/2021   K 3.2 (L) 10/25/2021   CL 100 10/25/2021   CREATININE 0.74 10/25/2021   BUN 11 10/25/2021   CO2 30 10/25/2021   TSH 1.94 10/25/2021   HGBA1C 5.5 05/24/2021   MICROALBUR 1.6 04/26/2011    ASSESSMENT AND PLAN:  Discussed the following assessment and plan:    ICD-10-CM   1. Depression, unspecified depression type  F32.A  2. Medication management  Z79.899     3. Vitamin B 12 deficiency  E53.8    dec to 3 d per week     Improvement on the Lexapro but not in remission ; no obvious side effects although if there is any sedation she could take it at night Add Wellbutrin XL 150 mg in the morning time as augmentation for depressive symptoms. Read to her what was said by the GI team on the messaging of the labs that are back yet knowing that the potassium level has decreased. Plan ROV virtual fine in about a month on this new regimen to evaluate her depression and fatigue. Cognitive factors have not changed. OSA not adequately rx  technical problems   Counseled. About flu vaccine and covid booster   Expectant management and discussion of plan and treatment with opportunity to ask questions and all were answered. The patient agreed with the plan and demonstrated an understanding of the instructions.   Advised to call back or seek an in-person evaluation if worsening  or having  further concerns  in interim. Return in about 1 month (around 11/26/2021) for medication check, can be virtual .    Shanon Ace, MD

## 2021-10-27 ENCOUNTER — Other Ambulatory Visit: Payer: Self-pay

## 2021-10-27 DIAGNOSIS — E876 Hypokalemia: Secondary | ICD-10-CM

## 2021-10-27 LAB — FECAL FAT, QUALITATIVE
Fat Qual Neutral, Stl: NORMAL
Fat Qual Total, Stl: NORMAL

## 2021-10-27 NOTE — Telephone Encounter (Signed)
Patient states she had questions yesterday on how to collect the stool studies but she did figure it out and brought them back to the office.

## 2021-10-29 LAB — GI PROFILE, STOOL, PCR

## 2021-10-31 LAB — CALPROTECTIN, FECAL: Calprotectin, Fecal: 74 ug/g (ref 0–120)

## 2021-11-02 ENCOUNTER — Encounter: Payer: Self-pay | Admitting: Pulmonary Disease

## 2021-11-02 ENCOUNTER — Ambulatory Visit: Payer: Medicare Other | Admitting: Pulmonary Disease

## 2021-11-02 VITALS — BP 138/70 | HR 68 | Temp 98.0°F | Ht <= 58 in | Wt 147.6 lb

## 2021-11-02 DIAGNOSIS — G4733 Obstructive sleep apnea (adult) (pediatric): Secondary | ICD-10-CM

## 2021-11-02 LAB — PANCREATIC ELASTASE, FECAL: Pancreatic Elastase-1, Stool: >500 mcg/g

## 2021-11-02 NOTE — Patient Instructions (Signed)
DME referral for BiPAP pressure change  Change pressure from 14/10-16/12  Call if pressures are not well-tolerated, more leakage of air.  We can always call the medical supply company to dial back the pressure  Trial with melatonin -Make sure you use it nightly for at least a week to judge the effect of it -About 3 mg nightly, may try 2 or 1 mg as well  Follow-up 6 to 8 weeks

## 2021-11-02 NOTE — Progress Notes (Signed)
Caitlin [name]    [id number]    08/29/1945  Primary Care Physician:Panosh, Standley Brooking, MD  Referring Physician: Burnis Medin, MD [place],  [place] [address]  Chief complaint:  History of obstructive sleep apnea on BiPAP therapy Recently had a mask change  HPI:  The mask change seems to have made a difference More energetic The mask still does have a leak but not as bad as previously  She moves around a lot in her sleep and this contributes to the mask leak  She has tried melatonin about 5 mg, this leaves a low bit hung over in the morning  We talked about using a lower dose of melatonin to see if this is effective 2 mg of 3 mg of melatonin may be tried  Download from her machine still shows she has AHI over 11 We had spoken about increasing the pressures during the last visit -We agreed on increasing the pressures from 14/10-16/12  Still wakes up in the morning feeling like she can get enough rest and needs a good amount of time to get herself going continues to have very restless sleep, nonrestorative  Does have a history of fibromyalgia  Study did reveal obstructive sleep apnea titrated to BiPAP of 18/14, currently on 14/10  Patient with a known history of obstructive sleep apnea During recent surgery, noted to have significant desaturations, concern for sleep disordered breathing with significant Known snorer Had a sleep study done a few years back that was positive for sleep disordered breathing, no treatment was offered at the time  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    Cyanocobalamin (VITAMIN B-12) 5000 MCG TBDP Place 5,000 mcg under the tongue daily.   dicyclomine (BENTYL) 10 MG capsule Take 1 capsule (10 mg total) by mouth every 8 (eight) hours as needed for spasms.   escitalopram (LEXAPRO) 10 MG tablet TAKE 1 & 1/2 (ONE & ONE-HALF) TABLETS BY MOUTH ONCE DAILY   esomeprazole (NEXIUM) 20 MG capsule Take 20 mg by mouth daily at 12 noon.   hydrochlorothiazide (HYDRODIURIL) 12.5 MG tablet Take 1 tablet (12.5 mg total) by mouth daily.   Peppermint Oil (IBGARD) 90 MG CPCR Take as directed   potassium chloride (KLOR-CON) 10 MEQ tablet TAKE 1 TABLET BY MOUTH EVERY OTHER DAY WITH  FLUID  TABLET   Simethicone 125 MG CAPS Take by mouth. PRN   SODIUM FLUORIDE 5000 SENSITIVE 1.1-5 % GEL Take by mouth.   No facility-administered encounter medications on file as of 11/02/2021.    Allergies as of 11/02/2021 - Review Complete 11/02/2021  Allergen Reaction Noted   Codeine phosphate Other (See Comments) 03/15/2006   Mobic [meloxicam] Diarrhea 11/28/2011   Protonix [pantoprazole sodium] Diarrhea 06/05/2012   Aleve [naproxen] Nausea Only 05/15/2016   Colestipol hcl  05/24/2017   Omeprazole-sodium bicarbonate Diarrhea 12/23/2008   Other Other (See Comments) 10/23/2017   Sodium  11/16/2017   Sulfamethoxazole Diarrhea and Nausea And Vomiting 03/15/2006    Past Medical History:  Diagnosis Date   Allergic rhinitis 07/11/2006   Back pain 10/09/2007  Blood transfusion without reported diagnosis    Diaphragmatic hernia 07/11/2006   Diverticulosis of colon    Eczema 07/11/2006   Elevated cholesterol 04/29/2012   Enteritis    Erythema nodosum    Tends to be seasonal has had a negative chest x-ray in the past negative PPD   Essential hypertension 07/11/2006   Fatigue 03/12/2007   Fibromyalgia    Gastroparesis 12/23/2008   Generalized anxiety disorder 09/01/2014   GERD (gastroesophageal reflux disease)    History of stricture and dilatation and Nissen surgery    Hearing loss, left ear 11/27/2008   History of cataract    bilateral; s/p surgery   Hyperglycemia 08/15/2006   Irritable bowel syndrome 12/23/2008   Leg cramps, nocturnal 11/11/2007   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   MCI (mild cognitive impairment) 10/22/2018   Obstructive sleep apnea 04/23/2007   uses CPAP nightly; mask has leak   Osteoarthritis 11/28/2011   Osteopenia    Prediabetes 04/19/2011   Vitamin B12 deficiency 05/13/2010    Past Surgical History:  Procedure Laterality Date   ABDOMINAL HYSTERECTOMY     bone spur     CATARACT EXTRACTION W/ INTRAOCULAR LENS  IMPLANT, BILATERAL     CESAREAN SECTION     x 1   CHOLECYSTECTOMY     COLONOSCOPY     DENTAL SURGERY     ESOPHAGOGASTRODUODENOSCOPY     stricture and dilatation   left shoulder surgery  06/08, 07/12/17   x 2   MOUTH SURGERY Right 89/37/3428   NISSEN FUNDOPLICATION     ROTATOR CUFF REPAIR Left    rt knee surgery     scope   UPPER GASTROINTESTINAL ENDOSCOPY      Family History  Problem Relation Age of Onset   Depression Mother    Kidney cancer Mother    Dementia Mother        Unspecified, likely secondary to other medical condition (cancer)   Depression Father    Hiatal hernia Father    COPD Sister    Dementia Sister        Unspecified, likely secondary to other medical condition   Nephrolithiasis Other    Colon cancer Neg Hx    Esophageal cancer Neg Hx    Pancreatic cancer Neg Hx    Rectal cancer Neg Hx    Stomach cancer Neg Hx     Social History   Socioeconomic History   Marital status: Married    Spouse name: Not on file   Number of children: 1   Years of education: 15   Highest education level: Some college, no degree  Occupational History   Occupation: Retired  Tobacco Use   Smoking status: Never   Smokeless tobacco: Never  Vaping Use   Vaping Use: Never used  Substance and Sexual Activity   Alcohol use: No    Alcohol/week: 0.0 standard drinks of alcohol   Drug use: No    Sexual activity: Not on file  Other Topics Concern   Not on file  Social History Narrative   Unemployed   Married   [place] of 2    Has grandchildren out of state   No pets   Sis. died Dec 10, 2009 COPD      Right handed   Social Determinants of Health   Financial Resource Strain: Low Risk  (08/25/2021)   Overall Financial Resource Strain (CARDIA)    Difficulty of Paying Living Expenses: Not hard at all  Food Insecurity: No Food Insecurity (08/25/2021)  Hunger Vital Sign    Worried About Running Out of Food in the Last Year: Never true    Ran Out of Food in the Last Year: Never true  Transportation Needs: No Transportation Needs (08/25/2021)   PRAPARE - Hydrologist (Medical): No    Lack of Transportation (Non-Medical): No  Physical Activity: Inactive (08/25/2021)   Exercise Vital Sign    Days of Exercise per Week: 0 days    Minutes of Exercise per Session: 0 min  Stress: No Stress Concern Present (08/25/2021)   [place]    Feeling of Stress : Not at all  Social Connections: [place] (08/25/2021)   Social Connection and Isolation Panel [NHANES]    Frequency of Communication with Friends and Family: More than three times a week    Frequency of Social Gatherings with Friends and Family: More than three times a week    Attends Religious Services: More than 4 times per year    Active Member of [employer] or Organizations: Yes    Attends Archivist Meetings: More than 4 times per year    Marital Status: Married  Human resources officer Violence: Not At Risk (08/25/2021)   Humiliation, Afraid, Rape, and Kick questionnaire    Fear of Current or Ex-Partner: No    Emotionally Abused: No    Physically Abused: No    Sexually Abused: No    Review of Systems  HENT: Negative.    Eyes: Negative.   Respiratory:  Positive for apnea and shortness of breath.   Cardiovascular: Negative.    Gastrointestinal: Negative.   Endocrine: Negative.   Musculoskeletal:  Positive for arthralgias and myalgias.  Psychiatric/Behavioral:  Positive for sleep disturbance.     Vitals:   11/02/21 1318  BP: 138/70  Pulse: 68  Temp: 98 F (36.7 C)  SpO2: 93%     Physical Exam Constitutional:      Appearance: She is well-developed. She is obese.  HENT:     Head: Normocephalic and atraumatic.     Mouth/Throat:     Mouth: Mucous membranes are moist.  Eyes:     General:        Right eye: No discharge.        Left eye: No discharge.  Neck:     Thyroid: No thyromegaly.  Cardiovascular:     Rate and Rhythm: Normal rate.  Pulmonary:     Effort: Pulmonary effort is normal. No respiratory distress.     Breath sounds: Normal breath sounds. No stridor. No wheezing or rhonchi.  Musculoskeletal:        General: Normal range of motion.     Cervical back: No rigidity or tenderness.  Neurological:     Mental Status: She is alert.  Psychiatric:        Mood and Affect: Mood normal.       03/15/2018    1:00 PM 02/13/2018   11:00 AM 09/25/2017    3:00 PM  Results of the Epworth flowsheet  Sitting and reading 0 0 0  Watching TV '3 3 1  '$ Sitting, inactive in a public place (e.g. a theatre or a meeting) 0 0 0  As a passenger in a car for an hour without a break 0 0 0  Lying down to rest in the afternoon when circumstances permit '3 3 2  '$ Sitting and talking to someone 0 0 0  Sitting quietly after a lunch  without alcohol 0 0 0  In a car, while stopped for a few minutes in traffic 0 0 0  Total score '6 6 3   '$ Download from the machine reveals she is on IPAP of 14, EPAP of 10 Average use of 6 hours 50 minutes Residual AHI of 11.3  Assessment:  Obstructive sleep apnea for which she is on pressure of 14/10 -We agreed on increasing the pressures to 16/12  Trial with melatonin for nonrestorative sleep and significant restlessness during sleep -3 mg or 2 mg of melatonin may be tried on a  nightly basis and assess effect  Nonrestorative sleep  Excessive daytime sleepiness/fatigue is likely multifactorial  Continues to have mask fit issues -This is a little bit better   Plan/Recommendations:  Change BiPAP settings to 16/12  Continue current mask  Trial with melatonin -Option of treatment will be low-dose Klonopin  Follow-up in about 6 weeks  Encouraged to call if she is not tolerating the pressure change  Sherrilyn Rist MD [facility] 11/02/2021, 1:23 PM  CC: Panosh, Standley Brooking, MD

## 2021-11-17 ENCOUNTER — Ambulatory Visit: Payer: Medicare Other

## 2021-11-18 ENCOUNTER — Other Ambulatory Visit: Payer: Self-pay | Admitting: Internal Medicine

## 2021-11-18 ENCOUNTER — Ambulatory Visit
Admission: RE | Admit: 2021-11-18 | Discharge: 2021-11-18 | Disposition: A | Discharge status: Home / Self Care | Payer: Medicare Other | Source: Ambulatory Visit | Attending: Internal Medicine | Admitting: Internal Medicine

## 2021-11-18 DIAGNOSIS — Z1231 Encounter for screening mammogram for malignant neoplasm of breast: Secondary | ICD-10-CM

## 2021-11-22 NOTE — Telephone Encounter (Signed)
Pt checking on progress of refill of potassium chloride (KLOR-CON) 10 MEQ tablet to pharmacy on file

## 2021-11-26 ENCOUNTER — Other Ambulatory Visit: Payer: Self-pay | Admitting: Internal Medicine

## 2021-12-14 ENCOUNTER — Encounter: Payer: Self-pay | Admitting: Internal Medicine

## 2021-12-19 ENCOUNTER — Ambulatory Visit: Payer: Medicare Other | Admitting: Pulmonary Disease

## 2021-12-21 ENCOUNTER — Other Ambulatory Visit: Payer: Self-pay | Admitting: Orthopedic Surgery

## 2021-12-21 DIAGNOSIS — S72001A Fracture of unspecified part of neck of right femur, initial encounter for closed fracture: Secondary | ICD-10-CM

## 2021-12-21 DIAGNOSIS — S329XXA Fracture of unspecified parts of lumbosacral spine and pelvis, initial encounter for closed fracture: Secondary | ICD-10-CM

## 2021-12-22 ENCOUNTER — Other Ambulatory Visit (HOSPITAL_COMMUNITY): Payer: Self-pay | Admitting: Orthopedic Surgery

## 2021-12-22 DIAGNOSIS — M25551 Pain in right hip: Secondary | ICD-10-CM

## 2021-12-23 ENCOUNTER — Ambulatory Visit (HOSPITAL_COMMUNITY)
Admission: RE | Admit: 2021-12-23 | Discharge: 2021-12-23 | Disposition: A | Discharge status: Home / Self Care | Payer: Medicare Other | Source: Ambulatory Visit | Attending: Orthopedic Surgery | Admitting: Orthopedic Surgery

## 2021-12-23 ENCOUNTER — Other Ambulatory Visit: Payer: Medicare Other

## 2021-12-23 DIAGNOSIS — M25551 Pain in right hip: Secondary | ICD-10-CM | POA: Diagnosis present

## 2021-12-26 ENCOUNTER — Other Ambulatory Visit: Payer: Medicare Other

## 2022-01-02 ENCOUNTER — Encounter: Payer: Self-pay | Admitting: Gastroenterology

## 2022-01-02 ENCOUNTER — Ambulatory Visit: Payer: Medicare Other | Admitting: Gastroenterology

## 2022-01-02 VITALS — BP 140/82 | HR 78 | Ht <= 58 in

## 2022-01-02 DIAGNOSIS — K2100 Gastro-esophageal reflux disease with esophagitis, without bleeding: Secondary | ICD-10-CM | POA: Diagnosis not present

## 2022-01-02 DIAGNOSIS — K449 Diaphragmatic hernia without obstruction or gangrene: Secondary | ICD-10-CM | POA: Diagnosis not present

## 2022-01-02 DIAGNOSIS — M6289 Other specified disorders of muscle: Secondary | ICD-10-CM

## 2022-01-02 DIAGNOSIS — K9189 Other postprocedural complications and disorders of digestive system: Secondary | ICD-10-CM | POA: Diagnosis not present

## 2022-01-02 DIAGNOSIS — K580 Irritable bowel syndrome with diarrhea: Secondary | ICD-10-CM

## 2022-01-02 MED ORDER — ESOMEPRAZOLE MAGNESIUM 40 MG PO CPDR: 40.0000 mg | DELAYED_RELEASE_CAPSULE | Freq: Every day | ORAL | 11 refills | Status: DC

## 2022-01-02 NOTE — Progress Notes (Unsigned)
Caitlin [name]    [id number]    [birth year]  Primary Care Physician:Panosh, Standley Brooking, MD  Referring Physician: Burnis Medin, MD [place],  [place] [address]   Chief complaint:  GERD  HPI:  76 year old very pleasant female here for follow-up visit for worsening GERD symptoms  She is experiencing more acid reflux symptoms despite taking daily Nexium.  Denies any dysphagia but has regurgitation, epigastric discomfort and retrosternal chest fullness and spasms.  She continues to have irregular bowel habits, intermittent diarrhea    She tried lactose-free diet with no improvement.  She does not think any dietary changes have helped   Dicyclomine helps relieve symptoms when she takes it as needed, takes it at bedtime on most days  Recently she has had multiple falls, denies any loss of consciousness but has been unable to brace herself, hit her head in 1 instance and has had multiple bruises from the fall couple weeks ago.   Colonoscopy 10/04/2016: Diverticulosis. Internal hemorrhoids. recall colonoscopy 10 years   EGD 10/04/2016: 6cm Hiatal hernia, Slipped Nissen   Denies any nausea, vomiting, abdominal pain, melena or bright red blood per rectum   Outpatient Encounter Medications as of 01/02/2022  Medication Sig   acetaminophen (TYLENOL) 650 MG CR tablet Take 650 mg by mouth every 8 (eight) hours as needed for pain.   buPROPion (WELLBUTRIN XL) 150 MG 24 hr tablet Take 1 tablet (150 mg total) by mouth daily.   Cyanocobalamin (VITAMIN B-12) 5000 MCG TBDP Place 2,000 mcg under the tongue daily.   dicyclomine (BENTYL) 10 MG capsule Take 1 capsule (10 mg total) by mouth every 8 (eight) hours as needed for spasms.   escitalopram (LEXAPRO) 10 MG tablet TAKE 1 & 1/2 (ONE & ONE-HALF) TABLETS BY MOUTH ONCE DAILY   esomeprazole (NEXIUM) 20 MG capsule Take 20 mg by mouth daily at 12 noon.   hydrochlorothiazide (HYDRODIURIL) 12.5 MG tablet Take 1 tablet by  mouth once daily   potassium chloride (KLOR-CON) 10 MEQ tablet TAKE 1 TABLET BY MOUTH EVERY OTHER DAY WITH  FLUID  TABLET   Simethicone 125 MG CAPS Take by mouth. PRN   SODIUM FLUORIDE 5000 SENSITIVE 1.1-5 % GEL Take by mouth.   Peppermint Oil (IBGARD) 90 MG CPCR Take as directed (Patient not taking: Reported on 01/02/2022)   No facility-administered encounter medications on file as of 01/02/2022.    Allergies as of 01/02/2022 - Review Complete 11/02/2021  Allergen Reaction Noted   Codeine phosphate Other (See Comments) 03/15/2006   Mobic [meloxicam] Diarrhea 11/28/2011   Protonix [pantoprazole sodium] Diarrhea 06/05/2012   Aleve [naproxen] Nausea Only 05/15/2016   Colestipol hcl  05/24/2017   Omeprazole-sodium bicarbonate Diarrhea 12/23/2008   Other Other (See Comments) 10/23/2017   Sodium  11/16/2017   Sulfamethoxazole Diarrhea and Nausea And Vomiting 03/15/2006    Past Medical History:  Diagnosis Date   Allergic rhinitis 07/11/2006   Back pain 10/09/2007   Blood transfusion without reported diagnosis    Diaphragmatic hernia 07/11/2006   Diverticulosis of colon    Eczema 07/11/2006   Elevated cholesterol 04/29/2012   Enteritis    Erythema nodosum    Tends to be seasonal has had a negative chest x-ray in the past negative PPD   Essential hypertension 07/11/2006   Fatigue 03/12/2007   Fibromyalgia    Gastroparesis 12/23/2008   Generalized anxiety disorder 09/01/2014   GERD (gastroesophageal reflux disease)  History of stricture and dilatation and Nissen surgery   Hearing loss, left ear 11/27/2008   History of cataract    bilateral; s/p surgery   Hyperglycemia 08/15/2006   Irritable bowel syndrome 12/23/2008   Leg cramps, nocturnal 11/11/2007   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   MCI (mild cognitive impairment) 10/22/2018   Obstructive sleep apnea 04/23/2007   uses CPAP nightly; mask has leak   Osteoarthritis 11/28/2011   Osteopenia    Prediabetes 04/19/2011    Vitamin B12 deficiency 05/13/2010    Past Surgical History:  Procedure Laterality Date   ABDOMINAL HYSTERECTOMY     bone spur     CATARACT EXTRACTION W/ INTRAOCULAR LENS  IMPLANT, BILATERAL     CESAREAN SECTION     x 1   CHOLECYSTECTOMY     COLONOSCOPY     DENTAL SURGERY     ESOPHAGOGASTRODUODENOSCOPY     stricture and dilatation   left shoulder surgery  06/08, 07/12/17   x 2   MOUTH SURGERY Right 19/37/9024   NISSEN FUNDOPLICATION     ROTATOR CUFF REPAIR Left    rt knee surgery     scope   UPPER GASTROINTESTINAL ENDOSCOPY      Family History  Problem Relation Age of Onset   Depression Mother    Kidney cancer Mother    Dementia Mother        Unspecified, likely secondary to other medical condition (cancer)   Depression Father    Hiatal hernia Father    COPD Sister    Dementia Sister        Unspecified, likely secondary to other medical condition   Nephrolithiasis Other    Colon cancer Neg Hx    Esophageal cancer Neg Hx    Pancreatic cancer Neg Hx    Rectal cancer Neg Hx    Stomach cancer Neg Hx     Social History   Socioeconomic History   Marital status: Married    Spouse name: Not on file   Number of children: 1   Years of education: 15   Highest education level: Some college, no degree  Occupational History   Occupation: Retired  Tobacco Use   Smoking status: Never   Smokeless tobacco: Never  Vaping Use   Vaping Use: Never used  Substance and Sexual Activity   Alcohol use: No    Alcohol/week: 0.0 standard drinks of alcohol   Drug use: No   Sexual activity: Not on file  Other Topics Concern   Not on file  Social History Narrative   Unemployed   Married   [place] of 2    Has grandchildren out of state   No pets   Sis. died 12/10/09 COPD      Right handed   Social Determinants of Health   Financial Resource Strain: Low Risk  (08/25/2021)   Overall Financial Resource Strain (CARDIA)    Difficulty of Paying Living Expenses: Not hard at all   Food Insecurity: No Food Insecurity (08/25/2021)   Hunger Vital Sign    Worried About Running Out of Food in the Last Year: Never true    Ran Out of Food in the Last Year: Never true  Transportation Needs: No Transportation Needs (08/25/2021)   PRAPARE - Hydrologist (Medical): No    Lack of Transportation (Non-Medical): No  Physical Activity: Inactive (08/25/2021)   Exercise Vital Sign    Days of Exercise per Week: 0 days  Minutes of Exercise per Session: 0 min  Stress: No Stress Concern Present (08/25/2021)   [place]    Feeling of Stress : Not at all  Social Connections: [place] (08/25/2021)   Social Connection and Isolation Panel [NHANES]    Frequency of Communication with Friends and Family: More than three times a week    Frequency of Social Gatherings with Friends and Family: More than three times a week    Attends Religious Services: More than 4 times per year    Active Member of [employer] or Organizations: Yes    Attends Music therapist: More than 4 times per year    Marital Status: Married  Human resources officer Violence: Not At Risk (08/25/2021)   Humiliation, Afraid, Rape, and Kick questionnaire    Fear of Current or Ex-Partner: No    Emotionally Abused: No    Physically Abused: No    Sexually Abused: No      Review of systems: All other review of systems negative except as mentioned in the HPI.   Physical Exam: Vitals:   01/02/22 1507  BP: (Abnormal) 140/82  Pulse: 78   Body mass index is 33.09 kg/m. Gen:      No acute distress HEENT:  sclera anicteric Abd:      soft, non-tender; no palpable masses, no distension Ext:    No edema Neuro: alert and oriented x 3 Psych: normal mood and affect  Data Reviewed:  Reviewed labs, radiology imaging, old records and pertinent past GI work up   Assessment and Plan/Recommendations:  76 year old very  pleasant female with chronic GERD, recurrent hiatal hernia with slipped Nissen and IBS predominant diarrhea   GERD with recurrent hiatal hernia and slipped Nissen: Increase Nexium to 40 mg daily Will defer EGD until patient completes workup for frequent falls, has follow-up appointment with neurology.  She may also benefit from cardiology appointment if needed to exclude any cardiac etiology for syncope  Will DC dicyclomine  IBS-diarrhea: Use Benefiber 1 tablespoon 3 times daily with meals Use squatty potty to improve evacuation during defecation If continues to have difficulty evacuating, pelvic floor dysfunction, will refer to pelvic floor physical therapy for biofeedback Continue IBgard as needed   Return in 2 to 3 months  This visit required 40 minutes of patient care (this includes precharting, chart review, review of results, face-to-face time used for counseling as well as treatment plan and follow-up. The patient was provided an opportunity to ask questions and all were answered. The patient agreed with the plan and demonstrated an understanding of the instructions.  Damaris Hippo , MD    CC: Panosh, Standley Brooking, MD

## 2022-01-02 NOTE — Patient Instructions (Signed)
We have sent the following medications to your pharmacy for you to pick up at your convenience: Nexium 40 mg daily.   Discontinue dicyclomine.   Follow up with your Neurologist.   Dr. Silverio Decamp wants you to use a squatty potty when using the bathroom.  Follow up with Dr. Silverio Decamp in 2 months. Call back in a few weeks for the March schedule.   The [facility] providers would like to encourage you to use [facility] to communicate with providers for non-urgent requests or questions.  Due to long hold times on the telephone, sending your provider a message by [facility] may be a faster and more efficient way to get a response.  Please allow 48 business hours for a response.  Please remember that this is for non-urgent requests.

## 2022-01-05 ENCOUNTER — Encounter: Payer: Self-pay | Admitting: Gastroenterology

## 2022-01-12 ENCOUNTER — Other Ambulatory Visit: Payer: Self-pay | Admitting: Internal Medicine

## 2022-01-23 ENCOUNTER — Inpatient Hospital Stay (HOSPITAL_BASED_OUTPATIENT_CLINIC_OR_DEPARTMENT_OTHER)
Admission: EM | Admit: 2022-01-23 | Discharge: 2022-01-27 | DRG: 552 | Disposition: A | Discharge status: Skilled Nursing Facility | Payer: Medicare Other | Attending: Internal Medicine | Admitting: Internal Medicine

## 2022-01-23 ENCOUNTER — Emergency Department (HOSPITAL_BASED_OUTPATIENT_CLINIC_OR_DEPARTMENT_OTHER): Payer: Medicare Other

## 2022-01-23 ENCOUNTER — Other Ambulatory Visit: Payer: Self-pay

## 2022-01-23 ENCOUNTER — Encounter (HOSPITAL_COMMUNITY): Payer: Self-pay

## 2022-01-23 ENCOUNTER — Observation Stay (HOSPITAL_COMMUNITY): Payer: Medicare Other

## 2022-01-23 ENCOUNTER — Encounter (HOSPITAL_BASED_OUTPATIENT_CLINIC_OR_DEPARTMENT_OTHER): Payer: Self-pay | Admitting: Emergency Medicine

## 2022-01-23 DIAGNOSIS — M797 Fibromyalgia: Secondary | ICD-10-CM | POA: Diagnosis not present

## 2022-01-23 DIAGNOSIS — F329 Major depressive disorder, single episode, unspecified: Secondary | ICD-10-CM | POA: Diagnosis present

## 2022-01-23 DIAGNOSIS — J309 Allergic rhinitis, unspecified: Secondary | ICD-10-CM | POA: Diagnosis not present

## 2022-01-23 DIAGNOSIS — F411 Generalized anxiety disorder: Secondary | ICD-10-CM | POA: Diagnosis not present

## 2022-01-23 DIAGNOSIS — S22089A Unspecified fracture of T11-T12 vertebra, initial encounter for closed fracture: Principal | ICD-10-CM | POA: Diagnosis present

## 2022-01-23 DIAGNOSIS — Z1152 Encounter for screening for COVID-19: Secondary | ICD-10-CM | POA: Diagnosis not present

## 2022-01-23 DIAGNOSIS — Y92009 Unspecified place in unspecified non-institutional (private) residence as the place of occurrence of the external cause: Secondary | ICD-10-CM

## 2022-01-23 DIAGNOSIS — R296 Repeated falls: Secondary | ICD-10-CM | POA: Diagnosis present

## 2022-01-23 DIAGNOSIS — Z9841 Cataract extraction status, right eye: Secondary | ICD-10-CM | POA: Diagnosis not present

## 2022-01-23 DIAGNOSIS — K219 Gastro-esophageal reflux disease without esophagitis: Secondary | ICD-10-CM | POA: Diagnosis present

## 2022-01-23 DIAGNOSIS — W1830XA Fall on same level, unspecified, initial encounter: Secondary | ICD-10-CM | POA: Diagnosis present

## 2022-01-23 DIAGNOSIS — E860 Dehydration: Secondary | ICD-10-CM | POA: Diagnosis not present

## 2022-01-23 DIAGNOSIS — S22080A Wedge compression fracture of T11-T12 vertebra, initial encounter for closed fracture: Secondary | ICD-10-CM | POA: Diagnosis present

## 2022-01-23 DIAGNOSIS — M8580 Other specified disorders of bone density and structure, unspecified site: Secondary | ICD-10-CM | POA: Diagnosis not present

## 2022-01-23 DIAGNOSIS — J9811 Atelectasis: Secondary | ICD-10-CM | POA: Diagnosis not present

## 2022-01-23 DIAGNOSIS — Z9842 Cataract extraction status, left eye: Secondary | ICD-10-CM

## 2022-01-23 DIAGNOSIS — W19XXXA Unspecified fall, initial encounter: Secondary | ICD-10-CM | POA: Diagnosis present

## 2022-01-23 DIAGNOSIS — G4733 Obstructive sleep apnea (adult) (pediatric): Secondary | ICD-10-CM | POA: Diagnosis not present

## 2022-01-23 DIAGNOSIS — E7800 Pure hypercholesterolemia, unspecified: Secondary | ICD-10-CM | POA: Diagnosis not present

## 2022-01-23 DIAGNOSIS — R0902 Hypoxemia: Secondary | ICD-10-CM | POA: Diagnosis not present

## 2022-01-23 DIAGNOSIS — E538 Deficiency of other specified B group vitamins: Secondary | ICD-10-CM | POA: Diagnosis not present

## 2022-01-23 DIAGNOSIS — K589 Irritable bowel syndrome without diarrhea: Secondary | ICD-10-CM | POA: Diagnosis not present

## 2022-01-23 DIAGNOSIS — S32029A Unspecified fracture of second lumbar vertebra, initial encounter for closed fracture: Secondary | ICD-10-CM | POA: Diagnosis present

## 2022-01-23 DIAGNOSIS — Z79899 Other long term (current) drug therapy: Secondary | ICD-10-CM

## 2022-01-23 DIAGNOSIS — I10 Essential (primary) hypertension: Secondary | ICD-10-CM | POA: Diagnosis not present

## 2022-01-23 DIAGNOSIS — G9341 Metabolic encephalopathy: Secondary | ICD-10-CM | POA: Diagnosis present

## 2022-01-23 DIAGNOSIS — K3184 Gastroparesis: Secondary | ICD-10-CM | POA: Diagnosis present

## 2022-01-23 DIAGNOSIS — Z882 Allergy status to sulfonamides status: Secondary | ICD-10-CM

## 2022-01-23 DIAGNOSIS — Z888 Allergy status to other drugs, medicaments and biological substances status: Secondary | ICD-10-CM

## 2022-01-23 DIAGNOSIS — H9192 Unspecified hearing loss, left ear: Secondary | ICD-10-CM | POA: Diagnosis present

## 2022-01-23 DIAGNOSIS — K580 Irritable bowel syndrome with diarrhea: Secondary | ICD-10-CM | POA: Diagnosis present

## 2022-01-23 DIAGNOSIS — Z961 Presence of intraocular lens: Secondary | ICD-10-CM | POA: Diagnosis not present

## 2022-01-23 DIAGNOSIS — Z825 Family history of asthma and other chronic lower respiratory diseases: Secondary | ICD-10-CM

## 2022-01-23 DIAGNOSIS — R7303 Prediabetes: Secondary | ICD-10-CM | POA: Diagnosis present

## 2022-01-23 DIAGNOSIS — Z885 Allergy status to narcotic agent status: Secondary | ICD-10-CM

## 2022-01-23 DIAGNOSIS — G8911 Acute pain due to trauma: Secondary | ICD-10-CM

## 2022-01-23 DIAGNOSIS — E876 Hypokalemia: Secondary | ICD-10-CM | POA: Diagnosis not present

## 2022-01-23 DIAGNOSIS — S46011A Strain of muscle(s) and tendon(s) of the rotator cuff of right shoulder, initial encounter: Secondary | ICD-10-CM | POA: Diagnosis present

## 2022-01-23 DIAGNOSIS — S32000A Wedge compression fracture of unspecified lumbar vertebra, initial encounter for closed fracture: Secondary | ICD-10-CM

## 2022-01-23 LAB — COMPREHENSIVE METABOLIC PANEL
ALT: 16 U/L (ref 0–44)
AST: 16 U/L (ref 15–41)
Albumin: 3.7 g/dL (ref 3.5–5.0)
Alkaline Phosphatase: 68 U/L (ref 38–126)
Anion gap: 11 (ref 5–15)
BUN: 12 mg/dL (ref 8–23)
CO2: 24 mmol/L (ref 22–32)
Calcium: 8.4 mg/dL — ABNORMAL LOW (ref 8.9–10.3)
Chloride: 97 mmol/L — ABNORMAL LOW (ref 98–111)
Creatinine, Ser: 0.48 mg/dL (ref 0.44–1.00)
GFR, Estimated: >60 mL/min (ref >60)
Glucose, Bld: 97 mg/dL (ref 70–99)
Potassium: 3.3 mmol/L — ABNORMAL LOW (ref 3.5–5.1)
Sodium: 132 mmol/L — ABNORMAL LOW (ref 135–145)
Total Bilirubin: 1 mg/dL (ref 0.3–1.2)
Total Protein: 5.5 g/dL — ABNORMAL LOW (ref 6.5–8.1)

## 2022-01-23 LAB — CBC WITH DIFFERENTIAL/PLATELET
Abs Immature Granulocytes: 0.06 10*3/uL (ref 0.00–0.07)
Basophils Absolute: 0 10*3/uL (ref 0.0–0.1)
Basophils Relative: 0 %
Eosinophils Absolute: 0 10*3/uL (ref 0.0–0.5)
Eosinophils Relative: 0 %
HCT: 36.9 % (ref 36.0–46.0)
Hemoglobin: 12.3 g/dL (ref 12.0–15.0)
Immature Granulocytes: 1 %
Lymphocytes Relative: 6 %
Lymphs Abs: 0.7 10*3/uL (ref 0.7–4.0)
MCH: 29.3 pg (ref 26.0–34.0)
MCHC: 33.3 g/dL (ref 30.0–36.0)
MCV: 87.9 fL (ref 80.0–100.0)
Monocytes Absolute: 0.6 10*3/uL (ref 0.1–1.0)
Monocytes Relative: 6 %
Neutro Abs: 9 10*3/uL — ABNORMAL HIGH (ref 1.7–7.7)
Neutrophils Relative %: 87 %
Platelets: 208 10*3/uL (ref 150–400)
RBC: 4.2 MIL/uL (ref 3.87–5.11)
RDW: 14.3 % (ref 11.5–15.5)
WBC: 10.4 10*3/uL (ref 4.0–10.5)
nRBC: 0 % (ref 0.0–0.2)

## 2022-01-23 LAB — RESP PANEL BY RT-PCR (RSV, FLU A&B, COVID)  RVPGX2
Influenza A by PCR: NEGATIVE
Influenza B by PCR: NEGATIVE
Resp Syncytial Virus by PCR: NEGATIVE
SARS Coronavirus 2 by RT PCR: NEGATIVE

## 2022-01-23 LAB — TROPONIN I (HIGH SENSITIVITY)
Troponin I (High Sensitivity): 5 ng/L (ref <18)
Troponin I (High Sensitivity): 6 ng/L (ref <18)

## 2022-01-23 LAB — MAGNESIUM: Magnesium: 1.8 mg/dL (ref 1.7–2.4)

## 2022-01-23 MED ORDER — TRAMADOL HCL 50 MG PO TABS
50.0000 mg | ORAL_TABLET | Freq: Two times a day (BID) | ORAL | Status: DC | PRN
  Administered 2022-01-23 – 2022-01-26 (×4): 50 mg via ORAL
  Filled 2022-01-23 (×4): qty 1

## 2022-01-23 MED ORDER — TRAMADOL HCL 50 MG PO TABS
50.0000 mg | ORAL_TABLET | Freq: Once | ORAL | Status: AC
  Administered 2022-01-23: 50 mg via ORAL
  Filled 2022-01-23: qty 1

## 2022-01-23 MED ORDER — ONDANSETRON HCL 4 MG/2ML IJ SOLN
4.0000 mg | Freq: Once | INTRAMUSCULAR | Status: AC
  Administered 2022-01-23: 4 mg via INTRAVENOUS
  Filled 2022-01-23: qty 2

## 2022-01-23 MED ORDER — BUPROPION HCL ER (XL) 150 MG PO TB24
150.0000 mg | ORAL_TABLET | Freq: Every day | ORAL | Status: DC
  Administered 2022-01-24 – 2022-01-27 (×4): 150 mg via ORAL
  Filled 2022-01-23 (×4): qty 1

## 2022-01-23 MED ORDER — SODIUM CHLORIDE 0.9 % IV SOLN: 500.0000 mg | Freq: Once | INTRAVENOUS | Status: DC

## 2022-01-23 MED ORDER — ACETAMINOPHEN 650 MG RE SUPP: 650.0000 mg | Freq: Four times a day (QID) | RECTAL | Status: DC | PRN

## 2022-01-23 MED ORDER — ONDANSETRON HCL 4 MG/2ML IJ SOLN
4.0000 mg | Freq: Four times a day (QID) | INTRAMUSCULAR | Status: DC | PRN
  Filled 2022-01-23: qty 2

## 2022-01-23 MED ORDER — POTASSIUM CHLORIDE IN NACL 40-0.9 MEQ/L-% IV SOLN
INTRAVENOUS | Status: DC
  Filled 2022-01-23 (×2): qty 1000

## 2022-01-23 MED ORDER — ENOXAPARIN SODIUM 40 MG/0.4ML IJ SOSY
40.0000 mg | PREFILLED_SYRINGE | INTRAMUSCULAR | Status: DC
  Administered 2022-01-23 – 2022-01-26 (×4): 40 mg via SUBCUTANEOUS
  Filled 2022-01-23 (×4): qty 0.4

## 2022-01-23 MED ORDER — ACETAMINOPHEN 325 MG PO TABS
650.0000 mg | ORAL_TABLET | Freq: Four times a day (QID) | ORAL | Status: DC | PRN
  Administered 2022-01-25 (×2): 650 mg via ORAL
  Filled 2022-01-23 (×3): qty 2

## 2022-01-23 MED ORDER — IPRATROPIUM-ALBUTEROL 0.5-2.5 (3) MG/3ML IN SOLN: 3.0000 mL | Freq: Four times a day (QID) | RESPIRATORY_TRACT | Status: DC | PRN

## 2022-01-23 MED ORDER — DICYCLOMINE HCL 10 MG PO CAPS: 10.0000 mg | ORAL_CAPSULE | Freq: Three times a day (TID) | ORAL | Status: DC | PRN

## 2022-01-23 MED ORDER — ESCITALOPRAM OXALATE 10 MG PO TABS
15.0000 mg | ORAL_TABLET | Freq: Every day | ORAL | Status: DC
  Administered 2022-01-24 – 2022-01-27 (×4): 15 mg via ORAL
  Filled 2022-01-23 (×4): qty 2

## 2022-01-23 MED ORDER — ONDANSETRON HCL 4 MG PO TABS: 4.0000 mg | ORAL_TABLET | Freq: Four times a day (QID) | ORAL | Status: DC | PRN

## 2022-01-23 MED ORDER — SODIUM CHLORIDE 0.9 % IV SOLN
1.0000 g | Freq: Once | INTRAVENOUS | Status: AC
  Administered 2022-01-23: 1 g via INTRAVENOUS
  Filled 2022-01-23: qty 10

## 2022-01-23 MED ORDER — POTASSIUM CHLORIDE CRYS ER 20 MEQ PO TBCR
40.0000 meq | EXTENDED_RELEASE_TABLET | Freq: Once | ORAL | Status: AC
  Administered 2022-01-23: 40 meq via ORAL
  Filled 2022-01-23: qty 2

## 2022-01-23 NOTE — Assessment & Plan Note (Signed)
Continue Wellbutrin and Lexapro.

## 2022-01-23 NOTE — Assessment & Plan Note (Signed)
Replaced. °

## 2022-01-23 NOTE — Assessment & Plan Note (Addendum)
SpO2 as low as 88-89% on room air.  CXR with streaky bibasilar lung opacities.  Most likely scarring/atelectasis.  No signs/symptoms to suggest infectious process.  COVID, flu, RSV negative. -Incentive spirometer, flutter valve, nebulizers as needed -Continue supplemental O2 and wean as able -Hold further antibiotics

## 2022-01-23 NOTE — Assessment & Plan Note (Signed)
Holding home regimen of hydrochlorothiazide Relaxed blood pressure targets due to advanced age and fall As needed hydralazine for markedly elevated blood pressure.

## 2022-01-23 NOTE — Progress Notes (Signed)
Pt's O2 on RA 90-92%. The Pt moves her finger and the O2 probe doesn't pick up good. RT will continue to monitor

## 2022-01-23 NOTE — ED Notes (Signed)
Pt educated about the [place]. Pt stated she is unable to provide urine specimen at this time

## 2022-01-23 NOTE — ED Provider Notes (Signed)
[facility] EMERGENCY DEPT Provider Note   CSN: [id number] Arrival date & time: 01/23/22  1022     History  Chief Complaint  Patient presents with   Nausea    Caitlin [name] is a 77 y.o. female BIB EMS with multiple issues.  Patient fell Saturday night while getting ready for bed.  Fall was witnessed by her husband who reports she fell onto her buttocks.  Did not hit her head or lose consciousness.  She was standing in the bathroom getting ready for bed at the time.  Patient and family think the fall was related to overall weakness. She denies preceding dizziness, lightheadedness, chest pain, palpitations or other symptoms.  She was seen at [facility] urgent care and diagnosed with 2 vertebral compression fractures.  Sent home with prescription for tramadol. She also sustained right shoulder pain after fall.  Was told at [facility] she likely has rotator cuff injury.  Patient with persistent back and right shoulder pain today.  Mobility is severely limited secondary to these issues.  Normally walks with a walker but has been unable to move around much since the fall.  Additionally, patient reports nausea and vomiting that began this morning. No significant abdominal pain, chest pain, or shortness of breath.  Has chronic diarrhea due to IBS, but no change in her stool patterns recently.  No known sick contacts.  Of note, she was noted to be hypoxic to 88 with EMS and was placed on 2L [place].     Home Medications Prior to Admission medications   Medication Sig Start Date End Date Taking? Authorizing Provider  acetaminophen (TYLENOL) 650 MG CR tablet Take 650 mg by mouth every 8 (eight) hours as needed for pain.    [provider]  buPROPion (WELLBUTRIN XL) 150 MG 24 hr tablet Take 1 tablet (150 mg total) by mouth daily. 10/26/21   Panosh, Standley Brooking, MD  Cyanocobalamin (VITAMIN B-12) 5000 MCG TBDP Place 2,000 mcg under the tongue daily.    Panosh, Standley Brooking, MD   dicyclomine (BENTYL) 10 MG capsule Take 1 capsule (10 mg total) by mouth every 8 (eight) hours as needed for spasms. 10/24/21   Vladimir Crofts, PA-C  escitalopram (LEXAPRO) 10 MG tablet TAKE 1 & 1/2 (ONE & ONE-HALF) TABLETS BY MOUTH ONCE DAILY 01/12/22   Panosh, Standley Brooking, MD  esomeprazole (NEXIUM) 40 MG capsule Take 1 capsule (40 mg total) by mouth daily. 01/02/22   Mauri Pole, MD  hydrochlorothiazide (HYDRODIURIL) 12.5 MG tablet Take 1 tablet by mouth once daily 11/28/21   Panosh, Standley Brooking, MD  Peppermint Oil (IBGARD) 90 MG CPCR Take as directed Patient not taking: Reported on 01/02/2022 10/24/21   Vladimir Crofts, PA-C  potassium chloride (KLOR-CON) 10 MEQ tablet TAKE 1 TABLET BY MOUTH EVERY OTHER DAY WITH  FLUID  TABLET 11/23/21   Panosh, Standley Brooking, MD  Simethicone 125 MG CAPS Take by mouth. PRN    [provider]  SODIUM FLUORIDE 5000 SENSITIVE 1.1-5 % GEL Take by mouth. 10/20/21   [provider]      Allergies    Codeine phosphate, Mobic [meloxicam], Protonix [pantoprazole sodium], Aleve [naproxen], Colestipol hcl, Omeprazole-sodium bicarbonate, Other, Sodium, and Sulfamethoxazole    Review of Systems   Review of Systems  Constitutional:  Negative for fever.  Respiratory:  Negative for cough and shortness of breath.   Cardiovascular:  Negative for chest pain.  Gastrointestinal:  Positive for nausea and vomiting. Negative for  abdominal pain.  Genitourinary:  Negative for dysuria.  Musculoskeletal:  Positive for back pain.       R shoulder pain  Neurological:  Positive for weakness. Negative for dizziness.    Physical Exam Updated Vital Signs BP (!) 145/66   Pulse 84   Temp 98.7 F (37.1 C) (Oral)   Resp (!) 21   Ht '4\' 8"'$  (1.422 m)   Wt 65.3 kg   SpO2 98%   BMI 32.28 kg/m  Physical Exam Constitutional:      General: She is not in acute distress. HENT:     Head: Normocephalic and atraumatic.     Mouth/Throat:     Mouth: Mucous membranes are  moist.  Cardiovascular:     Rate and Rhythm: Normal rate and regular rhythm.     Heart sounds: Normal heart sounds.  Pulmonary:     Comments: Mildly tachypneic, lungs CTA anteriorly Abdominal:     General: There is no distension.     Palpations: Abdomen is soft.     Tenderness: There is no abdominal tenderness.  Musculoskeletal:     Cervical back: Neck supple.     Comments: Severely limited ROM of right shoulder secondary to pain.  Will not abduct beyond 15 degrees due to pain.  No tenderness to palpation, neurovascularly intact distally.  Neurological:     General: No focal deficit present.     Mental Status: She is alert. Mental status is at baseline.     Sensory: No sensory deficit.     ED Results / Procedures / Treatments   Labs (all labs ordered are listed, but only abnormal results are displayed) Labs Reviewed  CBC WITH DIFFERENTIAL/PLATELET - Abnormal; Notable for the following components:      Result Value   Neutro Abs 9.0 (*)    All other components within normal limits  COMPREHENSIVE METABOLIC PANEL - Abnormal; Notable for the following components:   Sodium 132 (*)    Potassium 3.3 (*)    Chloride 97 (*)    Calcium 8.4 (*)    Total Protein 5.5 (*)    All other components within normal limits  RESP PANEL BY RT-PCR (RSV, FLU A&B, COVID)  [id number]  URINALYSIS, ROUTINE W REFLEX MICROSCOPIC  TROPONIN I (HIGH SENSITIVITY)  TROPONIN I (HIGH SENSITIVITY)    EKG EKG Interpretation  Date/Time:  Monday January 23 2022 10:29:14 EST Ventricular Rate:  83 PR Interval:  168 QRS Duration: 82 QT Interval:  360 QTC Calculation: 423 R Axis:   -14 Text Interpretation: Sinus rhythm Borderline T abnormalities, anterior leads Confirmed by Lavenia Atlas [phone]) on 01/23/2022 11:04:12 AM  Radiology DG Chest Portable 1 View  Result Date: 01/23/2022 CLINICAL DATA:  Hypoxia EXAM: PORTABLE CHEST 1 VIEW COMPARISON:  Radiograph 09/25/2017 FINDINGS: Unchanged cardiomediastinal  silhouette. Streaky bibasilar lung opacities. No large pleural effusion. No evidence of pneumothorax. Thoracic spondylosis. No acute osseous abnormality. IMPRESSION: Streaky bibasilar lung opacities, could reflect scarring/bandlike atelectasis or infectious/inflammatory process. This is new from prior, most recent exam for comparison in September 2019. Electronically Signed   By: Maurine Simmering M.D.   On: 01/23/2022 14:38    Procedures Procedures    Medications Ordered in ED Medications  ondansetron (ZOFRAN) injection 4 mg (4 mg Intravenous Given 01/23/22 1347)  traMADol (ULTRAM) tablet 50 mg (50 mg Oral Given 01/23/22 1513)    ED Course/ Medical Decision Making/ A&P  Medical Decision Making Amount and/or Complexity of Data Reviewed Labs: ordered. Radiology: ordered.  Risk Prescription drug management.   This is a 77 year old female with PMH significant for HTN, GERD, IBS, OSA, and prediabetes brought in by EMS after fall 2 days ago with known vertebral compression fractures.  Patient with severe limitations in her mobility due to back pain and right shoulder pain ever since her fall. Unable to get around at home, PCP recommending ED evaluation.  Separately, she has nausea and vomiting today as well as new oxygen requirement.  EKG unremarkable on arrival.  Differential for her hypoxemia and GI symptoms includes ACS, pneumonia, GERD, viral illness.  Doubt intra-abdominal process given benign abdominal exam. Doubt PE-- patient without shortness of breath, chest pain, or tachycardia. Has not had prolonged immobilization (only 1 day since fall). Will check basic labs including CBC, CMP, UA, troponin as well as CXR.  CXR with new bibasilar infiltrates concerning for pneumonia versus atelectasis and patient unable to wean oxygen, dropped to 89% again on room air.  Given new oxygen requirement and new mobility issues secondary to vertebral compression fracture and rotator  cuff injury, feel patient would benefit from admission for further management.  Case discussed with hospitalist, Dr. Grandville Silos, who accepts for admission pending results of COVID swab.   Final Clinical Impression(s) / ED Diagnoses Final diagnoses:  None    Rx / DC Orders ED Discharge Orders     None       Alcus Dad, MD PGY-3, [place]    Alcus Dad, MD 01/23/22 1624    Shelton, Alvin Critchley, DO 01/24/22 [phone]

## 2022-01-23 NOTE — Assessment & Plan Note (Signed)
Frequent falls over the last year likely due to deconditioning.  She reports an injury to her shoulder and reported being diagnosed with 2 vertebral compression fractures recently at [facility].  She is having continued right shoulder pain and lumbar back pain without alarm features.  She was prescribed a short course of tramadol yesterday. -X-ray right shoulder and L-spine -Continue tramadol as needed -PT/OT eval, fall precautions

## 2022-01-23 NOTE — ED Triage Notes (Signed)
Pt arrived via EMS, a/ox4. Fell yesterday, was seen by ortho. Sent home with Tramadol, c/o nausea today. Zofran given via EMS, some relief. Placed on 2L/[place] d/t oxygen sats 88%. Family at bedside. Respiratory notified.

## 2022-01-23 NOTE — ED Provider Notes (Signed)
I was the supervising attending working with the resident.  I personally interviewed, examined and participated in the medical decision making/disposition of this patient.  HPI: 77 year old female who had a mechanical fall yesterday sustaining compression fractures presents to the emergency department with persistent pain and mobility issues secondary to compression fractures.  She has also had a decline in her overall health as well as nausea/vomiting that started this morning.  No active chest pain.  Was noted to be hypoxic prearrival placed on 2 L nasal cannula  PE:  GEN- no acute distress HEENT- atraumatic, EOMI CARDIAC- RRR RESPIRATORY- effort nml ABDOMEN- soft, benign MSK- shoulder TTP and pain with movement NEURO- baseline  MDM: Patient presents with ongoing pain from a previous fall and general decline, now with new onset hypoxia.  Patient is requiring 2 L of nasal cannula which is a change from baseline.  Workup reveals bibasilar opacities, possibly infectious.  In the setting of hypoxia patient will be treated as such and admitted.   Lorelle Gibbs, DO 01/23/22 1549

## 2022-01-23 NOTE — Assessment & Plan Note (Signed)
Continue BiPAP nightly. 

## 2022-01-23 NOTE — Care Plan (Signed)
Was called by resident at [facility] freestanding ED on patient 77 year old female with history of hypertension, GERD, IBS, OSA, prediabetes presented to the ED after a fall 2 days prior to admission noted to have vertebral compression fractures seen in the outpatient setting by orthopedics, Weston Anna and discharged on tramadol for conservative management.  Patient also with complaints of right shoulder pain concern for possible rotator cuff injury.  Patient presenting back to the ED with uncontrolled pain, hypoxia with new O2 requirements, chest x-ray with concerns for new bibasilar infiltrates of atelectasis versus pneumonia.  Patient with significant difficulty with mobility and safe disposition home.  ED MD request admission for new onset hypoxia, uncontrolled pain, mobility issues and unsafe disposition.  Patient will likely require SNF.  COVID-19 PCR negative.  Labs notable for a hypokalemia.  Concern for pneumonia versus atelectasis.  Asked ED physician to please give a dose of IV Rocephin and azithromycin.  Patient accepted to MedSurg bed.

## 2022-01-23 NOTE — ED Notes (Signed)
Pt stated she is unable to provide urine specimen at this time

## 2022-01-23 NOTE — Hospital Course (Addendum)
Caitlin [name] is a 77 y.o. female with medical history significant for HTN, IBS with diarrhea, GERD, depression, MCI, OSA on BiPAP who presented to [facility] emergency department via EMS initially with hypoxia (88% as documented by EMS), back pain and nausea.  Several days after experiencing a fall at home.   Upon evaluation in the emergency department patient was found to have persisting hypoxia requiring supplemental oxygen.  Imaging revealed both L2 and T12 compression fractures.  Due to hypoxia and ongoing intractable pain the hospital group was called and patient was accepted for transfer to [facility] long hospital for continued medical care.   Patient underwent a thorough evaluation during the hospitalization for her hypoxia.  CT angiogram of the chest was performed revealing no evidence of pneumonia or pulmonary embolism.  It was determined that patient's transient hypoxia was likely secondary to splinting from severe back pain due to her compression fractures.  Concerning the patient's compression fractures of T12 and L2, this seems to have developed after a fall at home over the past weekend.  Patient was evaluated in consultation by orthopedic surgery upon arrival to our facility and they recommended nonoperative management.  Patient is also complaining of right shoulder pain which is suspected to be a rotator cuff injury.  Orthopedic surgery is recommending outpatient follow-up in 4 weeks.  Concerning the patient's substantial back pain, patient had been placed on scheduled 650 mg of Tylenol twice daily.  Patient has also been initiated on a short course of prednisone 40 mg by mouth once daily for total of 5 days.  With these interventions, patient's back pain gradually improved throughout the hospitalization and patient able to ambulate and work with physical therapy services.  Physical therapy recommended skilled physical therapy services in a skilled nursing facility.  Patient was also noted  to have some right lower quadrant tenderness on exam.  Hepatic function panel, lipase and urinalysis were all found to be unremarkable.  Abdominal exam was felt to be benign with point tenderness in that region without other abnormality.  Patient was tolerating oral intake without issue.  Pain seem to have been improving throughout the hospitalization and was felt to be musculoskeletal secondary to patient's recent fall.  Patient was discharged in improved and stable condition on 01/27/2022.

## 2022-01-23 NOTE — H&P (Signed)
History and Physical    Caitlin [name] WUX:[id number] DOB: 11-Jan-1946 DOA: 01/23/2022  PCP: Burnis Medin, MD  Patient coming from: Home  I have personally briefly reviewed patient's old medical records in [place]  Chief Complaint: Hypoxia  HPI: Caitlin [name] is a 77 y.o. female with medical history significant for HTN, IBS with diarrhea, GERD, depression, MCI, OSA on BiPAP who presented to the ED for evaluation of nausea.  Patient states that over the last year she has been having frequent falls.  She says sometimes her legs give out but other times has no explanation.  She says she has had an injury to her right shoulder and her lower back.  She was apparently recently diagnosed with 2 vertebral compression fractures.  She says she has been ambulating with the use of a walker and these last 2 weeks.  She says she is planning to start physical therapy arranged by [facility] and was supposed to go today however came to the ED for evaluation of nausea.  She has had poor appetite for the last couple days with some intermittent discomfort.  She has occasional issues with loose stools and has had recent negative stool testing through GI.  EMS were called to her home and she was noted to have SpO2 88% and was placed on 2 L O2 via [place].  She says she has not really had much shortness of breath or cough.  No fevers, chills, diaphoresis.  Denies chest pain.  [facility] ED Course  Labs/Imaging on admission: I have personally reviewed following labs and imaging studies.  Initial vitals showed BP 150/78, pulse 83, RR 17, temp 98.8 F, SpO2 96% on 2 L O2 via [place].  SpO2 on room air 89%.  Labs showed WBC 10.4, hemoglobin 12.3, platelets 208,000, sodium 132, potassium 3.3, bicarb 24, BUN 12, creatinine 0.48, serum glucose 97, LFTs within normal limits, troponin negative x 2, magnesium 1.8.  COVID, influenza, RSV PCR negative.  Portable chest x-ray showed streaky bibasilar  lung opacities new compared to prior exam September 2019.  Patient was given IV ceftriaxone and azithromycin, oral K 40 mEq, tramadol.  The hospitalist service was consulted to admit for further evaluation and management.  Review of Systems: All systems reviewed and are negative except as documented in history of present illness above.   Past Medical History:  Diagnosis Date   Allergic rhinitis 07/11/2006   Back pain 10/09/2007   Blood transfusion without reported diagnosis    Diaphragmatic hernia 07/11/2006   Diverticulosis of colon    Eczema 07/11/2006   Elevated cholesterol 04/29/2012   Enteritis    Erythema nodosum    Tends to be seasonal has had a negative chest x-ray in the past negative PPD   Essential hypertension 07/11/2006   Fatigue 03/12/2007   Fibromyalgia    Gastroparesis 12/23/2008   Generalized anxiety disorder 09/01/2014   GERD (gastroesophageal reflux disease)    History of stricture and dilatation and Nissen surgery   Hearing loss, left ear 11/27/2008   History of cataract    bilateral; s/p surgery   Hyperglycemia 08/15/2006   Irritable bowel syndrome 12/23/2008   Leg cramps, nocturnal 11/11/2007   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   MCI (mild cognitive impairment) 10/22/2018   Obstructive sleep apnea 04/23/2007   uses CPAP nightly; mask has leak   Osteoarthritis 11/28/2011   Osteopenia    Prediabetes 04/19/2011   Vitamin B12 deficiency 05/13/2010    Past Surgical  History:  Procedure Laterality Date   ABDOMINAL HYSTERECTOMY     bone spur     CATARACT EXTRACTION W/ INTRAOCULAR LENS  IMPLANT, BILATERAL     CESAREAN SECTION     x 1   CHOLECYSTECTOMY     COLONOSCOPY     DENTAL SURGERY     ESOPHAGOGASTRODUODENOSCOPY     stricture and dilatation   left shoulder surgery  06/08, 07/12/17   x 2   MOUTH SURGERY Right 41/74/0814   NISSEN FUNDOPLICATION     ROTATOR CUFF REPAIR Left    rt knee surgery     scope   UPPER GASTROINTESTINAL ENDOSCOPY       Social History:  reports that she has never smoked. She has never used smokeless tobacco. She reports that she does not drink alcohol and does not use drugs.  Allergies  Allergen Reactions   Codeine Phosphate Other (See Comments)    Hallucinations   Mobic [Meloxicam] Diarrhea   Protonix [Pantoprazole Sodium] Diarrhea and Other (See Comments)    "Leg nodules," also   Aleve [Naproxen] Nausea Only    Upset stomach if takes more than once a day   Colestipol Hcl     Patient reports reaction is "erythema multiforme"   Omeprazole-Sodium Bicarbonate Diarrhea    Sever diarrhea, stomach cramps   Other Other (See Comments)    States she can't tolerate milk, yogurt, cheese , ice cream and diarrhea   Sodium     No salt!   Sulfamethoxazole Diarrhea and Nausea And Vomiting    Family History  Problem Relation Age of Onset   Depression Mother    Kidney cancer Mother    Dementia Mother        Unspecified, likely secondary to other medical condition (cancer)   Depression Father    Hiatal hernia Father    COPD Sister    Dementia Sister        Unspecified, likely secondary to other medical condition   Nephrolithiasis Other    Colon cancer Neg Hx    Esophageal cancer Neg Hx    Pancreatic cancer Neg Hx    Rectal cancer Neg Hx    Stomach cancer Neg Hx      Prior to Admission medications   Medication Sig Start Date End Date Taking? Authorizing Provider  acetaminophen (TYLENOL) 650 MG CR tablet Take 650 mg by mouth every 8 (eight) hours as needed for pain.    [provider]  buPROPion (WELLBUTRIN XL) 150 MG 24 hr tablet Take 1 tablet (150 mg total) by mouth daily. 10/26/21   Panosh, Standley Brooking, MD  Cyanocobalamin (VITAMIN B-12) 5000 MCG TBDP Place 2,000 mcg under the tongue daily.    Panosh, Standley Brooking, MD  dicyclomine (BENTYL) 10 MG capsule Take 1 capsule (10 mg total) by mouth every 8 (eight) hours as needed for spasms. 10/24/21   Vladimir Crofts, PA-C  escitalopram (LEXAPRO) 10  MG tablet TAKE 1 & 1/2 (ONE & ONE-HALF) TABLETS BY MOUTH ONCE DAILY 01/12/22   Panosh, Standley Brooking, MD  esomeprazole (NEXIUM) 40 MG capsule Take 1 capsule (40 mg total) by mouth daily. 01/02/22   Mauri Pole, MD  hydrochlorothiazide (HYDRODIURIL) 12.5 MG tablet Take 1 tablet by mouth once daily 11/28/21   Panosh, Standley Brooking, MD  Peppermint Oil (IBGARD) 90 MG CPCR Take as directed Patient not taking: Reported on 01/02/2022 10/24/21   Vladimir Crofts, PA-C  potassium chloride (KLOR-CON) 10 MEQ tablet TAKE 1 TABLET  BY MOUTH EVERY OTHER DAY WITH  FLUID  TABLET 11/23/21   Panosh, Standley Brooking, MD  Simethicone 125 MG CAPS Take by mouth. PRN    [provider]  SODIUM FLUORIDE 5000 SENSITIVE 1.1-5 % GEL Take by mouth. 10/20/21   [provider]    Physical Exam: Vitals:   01/23/22 1645 01/23/22 1715 01/23/22 1745 01/23/22 1822  BP: (!) 156/69 (!) 164/79  (!) 156/80  Pulse: 90 95  96  Resp: (!) 25 (!) 26  20  Temp:   98.3 F (36.8 C) 98.5 F (36.9 C)  TempSrc:   Oral Oral  SpO2: 98% 98%  94%  Weight:      Height:       Constitutional: Resting in bed, NAD, calm, comfortable Eyes: EOMI, lids and conjunctivae normal ENMT: Mucous membranes are dry. Posterior pharynx clear of any exudate or lesions.Normal dentition.  Neck: normal, supple, no masses. Respiratory: Bibasilar inspiratory crackles. Normal respiratory effort. No accessory muscle use.  Cardiovascular: Regular rate and rhythm, no murmurs / rubs / gallops. No extremity edema. 2+ pedal pulses. Abdomen: no tenderness, no masses palpated.  Musculoskeletal: Point tenderness over lumbar spinal process.  No clubbing / cyanosis. No joint deformity upper and lower extremities.  Pain elicited right shoulder with arm movement against resistance. Skin: no rashes, lesions, ulcers. No induration Neurologic:  Sensation intact. Strength slightly diminished RUE due to pain in right shoulder otherwise intact to lower  extremities. Psychiatric: Alert and oriented x 3.  Still  EKG: Personally reviewed. Sinus rhythm, rate 83, nonspecific T wave changes leads III and anteriorly.  Assessment/Plan Principal Problem:   Hypoxia Active Problems:   Fall with injury   Hypokalemia   Essential hypertension   Major depressive disorder   Irritable bowel syndrome   Obstructive sleep apnea   Caitlin [name] is a 77 y.o. female with medical history significant for HTN, IBS with diarrhea, GERD, depression, MCI, OSA on BiPAP who is admitted with hypoxia.  Assessment and Plan: * Hypoxia SpO2 as low as 88-89% on room air.  CXR with streaky bibasilar lung opacities.  Most likely scarring/atelectasis.  No signs/symptoms to suggest infectious process.  COVID, flu, RSV negative. -Incentive spirometer, flutter valve, nebulizers as needed -Continue supplemental O2 and wean as able -Hold further antibiotics  Fall with injury Frequent falls over the last year likely due to deconditioning.  She reports an injury to her shoulder and reported being diagnosed with 2 vertebral compression fractures recently at [facility].  She is having continued right shoulder pain and lumbar back pain without alarm features.  She was prescribed a short course of tramadol yesterday. -X-ray right shoulder and L-spine -Continue tramadol as needed -PT/OT eval, fall precautions  Hypokalemia Oral supplement given.  Essential hypertension Hold HCTZ with hypokalemia.  Obstructive sleep apnea Continue BiPAP nightly.  Irritable bowel syndrome Some recent nausea and loose stools.  Seen by GI with recent negative labs/stool testing.  Appetite has been low and she is slightly dehydrated. -IV fluid hydration overnight -Encourage oral intake -Continue Bentyl as needed  Major depressive disorder Continue Wellbutrin and Lexapro.  DVT prophylaxis: enoxaparin (LOVENOX) injection 40 mg Start: 01/23/22 2200 Code Status: Full code at time of  admission Family Communication: Discussed with patient, she has discussed with family Disposition Plan: From home, dispo pending clinical progress Consults called: None Severity of Illness: The appropriate patient status for this patient is OBSERVATION. Observation status is judged to be reasonable and necessary in order to  provide the required intensity of service to ensure the patient's safety. The patient's presenting symptoms, physical exam findings, and initial radiographic and laboratory data in the context of their medical condition is felt to place them at decreased risk for further clinical deterioration. Furthermore, it is anticipated that the patient will be medically stable for discharge from the hospital within 2 midnights of admission.   Zada Finders MD [facility]  If 7PM-7AM, please contact night-coverage [email or web address]  01/23/2022, 8:00 PM

## 2022-01-23 NOTE — Assessment & Plan Note (Signed)
Some recent nausea and loose stools.  Seen by GI with recent negative labs/stool testing.  Appetite has been low and she is slightly dehydrated. -IV fluid hydration overnight -Encourage oral intake -Continue Bentyl as needed

## 2022-01-23 NOTE — Plan of Care (Signed)
°  Problem: Coping: °Goal: Level of anxiety will decrease °Outcome: Progressing °  °

## 2022-01-23 NOTE — ED Notes (Signed)
[facility] at [facility] will send transport.-ABB(NS)

## 2022-01-23 NOTE — ED Notes (Signed)
Pt was repositioned. Family at bedside.

## 2022-01-24 DIAGNOSIS — R0902 Hypoxemia: Secondary | ICD-10-CM | POA: Diagnosis not present

## 2022-01-24 LAB — CBC
HCT: 37.7 % (ref 36.0–46.0)
Hemoglobin: 12.3 g/dL (ref 12.0–15.0)
MCH: 29.7 pg (ref 26.0–34.0)
MCHC: 32.6 g/dL (ref 30.0–36.0)
MCV: 91.1 fL (ref 80.0–100.0)
Platelets: 196 10*3/uL (ref 150–400)
RBC: 4.14 MIL/uL (ref 3.87–5.11)
RDW: 14.5 % (ref 11.5–15.5)
WBC: 11.7 10*3/uL — ABNORMAL HIGH (ref 4.0–10.5)
nRBC: 0 % (ref 0.0–0.2)

## 2022-01-24 LAB — BASIC METABOLIC PANEL
Anion gap: 11 (ref 5–15)
BUN: 14 mg/dL (ref 8–23)
CO2: 19 mmol/L — ABNORMAL LOW (ref 22–32)
Calcium: 8.7 mg/dL — ABNORMAL LOW (ref 8.9–10.3)
Chloride: 100 mmol/L (ref 98–111)
Creatinine, Ser: 0.65 mg/dL (ref 0.44–1.00)
GFR, Estimated: >60 mL/min (ref >60)
Glucose, Bld: 87 mg/dL (ref 70–99)
Potassium: 4.5 mmol/L (ref 3.5–5.1)
Sodium: 130 mmol/L — ABNORMAL LOW (ref 135–145)

## 2022-01-24 NOTE — Evaluation (Signed)
Physical Therapy Evaluation Patient Details Name: CADENCE MINTON MRN: [id number] DOB: 1945/06/13 Today's Date: 01/24/2022  History of Present Illness  Patient is a 77 year old female who presented with nausea. Patient reported having multiple falls with recent compression fracture of 2 vertebra. Patient was admitted with hypoxia, R shoulder pain, PMH: OSA, IBS, MDD, hypokalemia, HTN.  Clinical Impression  Pt admitted with above diagnosis.  Pt with limited activity tolerance, decr balance and global deconditioning. Pt endorses multiple recent falls and general recent physical decline. Pt will likely need SNF unless husband able to provide needed assist.   Will continue to follow in acute setting and progress as able.  Pt currently with functional limitations due to the deficits listed below (see PT Problem List). Pt will benefit from skilled PT to increase their independence and safety with mobility to allow discharge to the venue listed below.          Recommendations for follow up therapy are one component of a multi-disciplinary discharge planning process, led by the attending physician.  Recommendations may be updated based on patient status, additional functional criteria and insurance authorization.  Follow Up Recommendations Skilled nursing-short term rehab (<3 hours/day) Can patient physically be transported by private vehicle: No    Assistance Recommended at Discharge Frequent or constant Supervision/Assistance  Patient can return home with the following  A little help with walking and/or transfers;Help with stairs or ramp for entrance;A little help with bathing/dressing/bathroom;Assistance with cooking/housework;Assist for transportation    Equipment Recommendations Other (comment) (TBD)  Recommendations for Other Services       Functional Status Assessment Patient has had a recent decline in their functional status and demonstrates the ability to make significant improvements in  function in a reasonable and predictable amount of time.     Precautions / Restrictions Precautions Precautions: Fall Precaution Comments: back precautions, monitor O2 (only uses at night at home) Restrictions Weight Bearing Restrictions: No      Mobility  Bed Mobility Overal bed mobility: Needs Assistance Bed Mobility: Sit to Sidelying         Sit to sidelying: Mod assist General bed mobility comments: assist to elevate LEs with multi-modal cues for s/l, back precautions    Transfers Overall transfer level: Needs assistance Equipment used: Rolling walker (2 wheels) Transfers: Sit to/from Stand, Bed to chair/wheelchair/BSC Sit to Stand: Min assist   Step pivot transfers: Min assist       General transfer comment: multi-modal cues for hand placement and to power up to standing    Ambulation/Gait Ambulation/Gait assistance: Min assist Gait Distance (Feet): 6 Feet Assistive device: Rolling walker (2 wheels) Gait Pattern/deviations: Wide base of support, Decreased step length - right, Decreased step length - left, Shuffle Gait velocity: decr     General Gait Details: pt wth unsteady gait, fatigues quickly. min assist to balance and maneuver RW.  pt feels as if she is falling backward (no posterior LOB)  Stairs            Wheelchair Mobility    Modified Rankin (Stroke Patients Only)       Balance Overall balance assessment: Needs assistance Sitting-balance support: Feet supported, No upper extremity supported Sitting balance-Leahy Scale: Fair     Standing balance support: Single extremity supported, Reliant on assistive device for balance Standing balance-Leahy Scale: Poor Standing balance comment: pt reliant on unilateral UE support to pull up briefs and perform pericare. reliant on UEs and external assist to amb  Pertinent Vitals/Pain Pain Assessment Pain Assessment: Faces Faces Pain Scale: Hurts little  more Pain Location: R shoulder with movement and back Pain Descriptors / Indicators: Grimacing, Guarding, Sore Pain Intervention(s): Limited activity within patient's tolerance, Monitored during session, Repositioned    Home Living Family/patient expects to be discharged to:: Private residence Living Arrangements: Spouse/significant other Available Help at Discharge: Family;Available PRN/intermittently Type of Home: House Home Access: Stairs to enter Entrance Stairs-Rails: None Entrance Stairs-Number of Steps: 1 +1   Home Layout: One level Home Equipment: Rollator (4 wheels);Shower seat;Grab bars - tub/shower;Grab bars - toilet      Prior Function Prior Level of Function : History of Falls (last six months);Independent/Modified Independent             Mobility Comments: endorses at least one fall per month for last 3 mos; amb with rollator recently d/t fear of falling ADLs Comments: moves slow with increased fear of falling at home.     Hand Dominance        Extremity/Trunk Assessment   Upper Extremity Assessment Upper Extremity Assessment: Defer to OT evaluation RUE Deficits / Details: rotator cuff tear per daughter report prior to admission, painful with any movement unable to WB RUE: Unable to fully assess due to pain    Lower Extremity Assessment Lower Extremity Assessment: Generalized weakness    Cervical / Trunk Assessment Cervical / Trunk Assessment: Kyphotic  Communication   Communication: No difficulties  Cognition Arousal/Alertness: Awake/alert Behavior During Therapy: WFL for tasks assessed/performed   Area of Impairment: Following commands, Safety/judgement, Problem solving                       Following Commands: Follows one step commands with increased time, Follows multi-step commands inconsistently Safety/Judgement: Decreased awareness of deficits   Problem Solving: Slow processing, Decreased initiation, Requires verbal cues,  Requires tactile cues General Comments: dtr reports mentation is her baseline        General Comments      Exercises     Assessment/Plan    PT Assessment Patient needs continued PT services  PT Problem List Decreased strength;Decreased activity tolerance;Decreased coordination;Decreased knowledge of use of DME;Decreased balance;Pain;Decreased knowledge of precautions;Decreased cognition;Decreased mobility       PT Treatment Interventions DME instruction;Functional mobility training;Gait training;Therapeutic activities;Balance training;Patient/family education;Therapeutic exercise    PT Goals (Current goals can be found in the Care Plan section)  Acute Rehab PT Goals Patient Stated Goal: be able to do more for self PT Goal Formulation: With patient Time For Goal Achievement: 02/07/22 Potential to Achieve Goals: Good    Frequency Min 2X/week     Co-evaluation               AM-PAC PT "6 Clicks" Mobility  Outcome Measure Help needed turning from your back to your side while in a flat bed without using bedrails?: A Lot Help needed moving from lying on your back to sitting on the side of a flat bed without using bedrails?: A Lot Help needed moving to and from a bed to a chair (including a wheelchair)?: A Little Help needed standing up from a chair using your arms (e.g., wheelchair or bedside chair)?: A Little Help needed to walk in hospital room?: A Little Help needed climbing 3-5 steps with a railing? : A Lot 6 Click Score: 15    End of Session Equipment Utilized During Treatment: Gait belt Activity Tolerance: Patient tolerated treatment well Patient left: in bed;with bed alarm set;with  call bell/phone within reach;with family/visitor present   PT Visit Diagnosis: Unsteadiness on feet (R26.81);Other abnormalities of gait and mobility (R26.89);Muscle weakness (generalized) (M62.81);Repeated falls (R29.6)    Time: 1425-1501 PT Time Calculation (min) (ACUTE ONLY): 36  min   Charges:   PT Evaluation $PT Eval Low Complexity: 1 Low PT Treatments $Therapeutic Activity: 8-22 mins        Baxter Flattery, PT  Acute Rehab Dept [facility]) [phone]  [facility] Weekend Pager [facility] only)  [phone]  01/24/2022   [facility] 01/24/2022, 3:14 PM

## 2022-01-24 NOTE — Progress Notes (Signed)
     Patient presented to our after hours urgent care clinic on Sunday 01/22/22 after falling at home. She was diagnosed with 2 compression fractures and a possible rotator cuff injury based on exam and xrays. Was given Tramadol and sent home. On Monday morning 01/23/22, the patient's husband called the office and I spoke to him about her status. He reported she hadn't been able to keep anything down including medicine for several hours due to nausea and vomiting. She was getting very weak and he was unable to move her from a chair or the bed. He reported feeling overwhelmed. I recommended that if he could get her in the car and come to the office we could see her, but that it sounded like she most likely needed EMS to take her to the ER to help control the nausea and vomiting, give IV fluids, and help with pain control. He agreed.    Review of imaging done while at the hospital confirms superior endplate fractures of V42 and L2 with approximately 50% loss of height. Right shoulder xrays show no evidence of fractures or dislocations. Osteoarthritis seen at [facility] joint. Would need an MRI to truly evaluate for a cuff tear.  Recommend she continue with the plan of starting PT to work on overall strengthening. Can follow up in the office on an outpatient basis in 4-6 weeks if still having pain in the right shoulder. Non-op for the compression fractures. Can be given a back brace if helps with pain. Limit narcotics due to age and fall risk if able. Try to stick with Tylenol, heat/ice, and both oral and topical anti-inflammatory medicines.    Available if any issues or questions arise during her stay.    Britt Bottom, PA-C Office [phone] 01/24/2022, 12:55 PM

## 2022-01-24 NOTE — Plan of Care (Signed)
  Problem: Education: Goal: Knowledge of General Education information will improve Description: Including pain rating scale, medication(s)/side effects and non-pharmacologic comfort measures Outcome: Progressing   Problem: Activity: Goal: Risk for activity intolerance will decrease Outcome: Progressing   Problem: Pain Managment: Goal: General experience of comfort will improve Outcome: Progressing   

## 2022-01-24 NOTE — Plan of Care (Signed)
  Problem: Clinical Measurements: Goal: Respiratory complications will improve Outcome: Progressing   

## 2022-01-24 NOTE — Evaluation (Signed)
Occupational Therapy Evaluation Patient Details Name: Caitlin [name] MRN: [id number] DOB: Dec 11, 1945 Today's Date: 01/24/2022   History of Present Illness Patient is a 77 year old female who presented with nausea. Patient reported having multiple falls with recent compression fracture of 2 vertebra. Patient was admitted with hypoxia, R shoulder pain, PMH: OSA, IBS, MDD, hypokalemia, HTN.   Clinical Impression   Patient is a 77 year old female who was admitted for above. Patient was living at home with husband who patient reported had a recent surgery and is home recovering. Patient's daughter was present later during session saying that patient has a possible RTC tear with ortho recommending taking it easy with RUE. Patient was noted to have decreased functional activity tolerance, increased pain in RUE, increased fear of falling, decreased endurance, decreased standing balance, decreased safety awareness, and decreased knowledge of AD/AE impacting participation in ADLs. Patient would continue to benefit from skilled OT services at this time while admitted and after d/c to address noted deficits in order to improve overall safety and independence in ADLs.        Recommendations for follow up therapy are one component of a multi-disciplinary discharge planning process, led by the attending physician.  Recommendations may be updated based on patient status, additional functional criteria and insurance authorization.   Follow Up Recommendations  Home health OT     Assistance Recommended at Discharge Frequent or constant Supervision/Assistance  Patient can return home with the following A little help with walking and/or transfers;A little help with bathing/dressing/bathroom;Assistance with cooking/housework;Direct supervision/assist for medications management;Direct supervision/assist for financial management;Help with stairs or ramp for entrance;Assist for transportation    Functional Status  Assessment  Patient has had a recent decline in their functional status and demonstrates the ability to make significant improvements in function in a reasonable and predictable amount of time.  Equipment Recommendations  Other (comment) Management consultant,)    Recommendations for Other Services       Precautions / Restrictions Precautions Precautions: Fall Precaution Comments: back precautions, monitor O2 only uses at night at home Restrictions Weight Bearing Restrictions: No      Mobility Bed Mobility     General bed mobility comments: patient was up in recliner and returned to the same        Balance Overall balance assessment: Mild deficits observed, not formally tested            ADL either performed or assessed with clinical judgement   ADL Overall ADL's : Needs assistance/impaired Eating/Feeding: Set up;Sitting   Grooming: Set up;Sitting;Wash/dry face;Wash/dry hands Grooming Details (indicate cue type and reason): in recliner,noted to was h face x4 during session when cues were provided to wash other areas Upper Body Bathing: Set up;Sitting Upper Body Bathing Details (indicate cue type and reason): in recliner Lower Body Bathing: Moderate assistance;Sitting/lateral leans Lower Body Bathing Details (indicate cue type and reason): in recliner with increased time. Upper Body Dressing : Set up;Sitting Upper Body Dressing Details (indicate cue type and reason): in recliner Lower Body Dressing: Moderate assistance;Sitting/lateral leans;Sit to/from stand   Toilet Transfer: Min guard;Ambulation;Rolling walker (2 wheels) Toilet Transfer Details (indicate cue type and reason): with significantly increased time patient noted to be hesitant with turns and transitions into sitting. patient and daughter in room were educated on holding RUE to chest and using LUE to reach back from walker and slow transition into chair. patient noted to have increased reaching to grab out to walker  with each attempt to educated  with verbal and tactile cues for proper hand placemnet. patients daughter reported she was like this at home previously. Toileting- Water quality scientist and Hygiene: Min guard;Sit to/from stand Toileting - Clothing Manipulation Details (indicate cue type and reason): with one UE support on RW     Functional mobility during ADLs: Min guard;Rolling walker (2 wheels) General ADL Comments: with significantly increased time in room with RW and supplemental O2. patient reported feeling SOB but O2 was 94% on RA and HR was 90 bpm.     Vision Baseline Vision/History: 1 Wears glasses              Pertinent Vitals/Pain Pain Assessment Pain Assessment: Faces Faces Pain Scale: Hurts little more Pain Location: R shoulder with movement Pain Descriptors / Indicators: Grimacing, Guarding Pain Intervention(s): Limited activity within patient's tolerance, Monitored during session, Repositioned        Extremity/Trunk Assessment Upper Extremity Assessment Upper Extremity Assessment: RUE deficits/detail RUE Deficits / Details: rotator cuff tear per daughter report prior to admission, painful with any movement unable to WB RUE: Unable to fully assess due to pain   Lower Extremity Assessment Lower Extremity Assessment: Defer to PT evaluation   Cervical / Trunk Assessment Cervical / Trunk Assessment: Kyphotic   Communication     Cognition Arousal/Alertness: Awake/alert Behavior During Therapy: WFL for tasks assessed/performed Overall Cognitive Status: Difficult to assess         General Comments: patient was soft spoken and daughter entered room during session. patient was noted to have some confusion on where her fractures were.                Home Living Family/patient expects to be discharged to:: Private residence Living Arrangements: Spouse/significant other Available Help at Discharge: Family;Available PRN/intermittently Type of Home: House        Home Layout: One level          Prior Functioning/Environment Prior Level of Function : History of Falls (last six months);Independent/Modified Independent         ADLs Comments: moves slow with increased fear of falling at home.        OT Problem List: Decreased activity tolerance;Impaired balance (sitting and/or standing);Pain;Impaired UE functional use;Decreased coordination;Decreased safety awareness;Decreased knowledge of precautions;Decreased knowledge of use of DME or AE;Cardiopulmonary status limiting activity      OT Treatment/Interventions: Energy conservation;Self-care/ADL training;Therapeutic exercise;DME and/or AE instruction;Therapeutic activities;Patient/family education;Balance training    OT Goals(Current goals can be found in the care plan section) Acute Rehab OT Goals Patient Stated Goal: to get shoulder better OT Goal Formulation: With patient/family Time For Goal Achievement: 02/07/22 Potential to Achieve Goals: Fair  OT Frequency: Min 2X/week       AM-PAC OT "6 Clicks" Daily Activity     Outcome Measure Help from another person eating meals?: A Little Help from another person taking care of personal grooming?: A Little Help from another person toileting, which includes using toliet, bedpan, or urinal?: A Little Help from another person bathing (including washing, rinsing, drying)?: A Lot Help from another person to put on and taking off regular upper body clothing?: A Little Help from another person to put on and taking off regular lower body clothing?: A Lot 6 Click Score: 16   End of Session Equipment Utilized During Treatment: Gait belt;Rolling walker (2 wheels) Nurse Communication: Mobility status  Activity Tolerance: Patient tolerated treatment well Patient left: in chair;with call bell/phone within reach;with chair alarm set;with family/visitor present  OT Visit Diagnosis: Unsteadiness on  feet (R26.81);Other abnormalities of gait and mobility  (R26.89);Muscle weakness (generalized) (M62.81);Pain                Time: 3709-6438 OT Time Calculation (min): 33 min Charges:  OT General Charges $OT Visit: 1 Visit OT Evaluation $OT Eval Moderate Complexity: 1 Mod OT Treatments $Self Care/Home Management : 8-22 mins  Rennie Plowman, MS Acute Rehabilitation Department Office# [phone]   Willa Rough 01/24/2022, 1:30 PM

## 2022-01-24 NOTE — Progress Notes (Signed)
PROGRESS NOTE   Caitlin [name]  DJT:[id number] DOB: 1945/07/08 DOA: 01/23/2022 PCP: Burnis Medin, MD  Brief Narrative:  77 year old white female HTN IBS diarrhea predominant reflux depression OSA on BiPAP Accidental fall at home 1/6 no LOC-seen at orthopedic urgent care-2 vertebral compression fractures and right rotator cuff injury noted found patient started on tramadol She also developed nausea on 1/8 and came to ED found to have new oxygen requirement CXR = basilar infiltrates with hypoxia to 89% on room air Sodium 132 potassium 4.5 BUN/creatinine 12/0.4 WBC 10.4 platelet 208 COVID and respiratory panel negative  Hospital-Problem based course  Atelectasis causing hypoxia - I-S every 2 hourly does not desat below 90% on room air - Hold all antibiotics and reassess if fever curve - Stable, follow CBC in a.m.  Debility and weakness with recent vertebral compression fracture on left side with pain and right rotator cuff injury - Therapy recommending home health with OT - Await PT input and reevaluate -Repeat x-rays show T12 and L2 show 50% loss of height, right shoulder x-ray is negative for fracture -Pain control with Ultram 50 every 12 as needed, add Percocet as pain is not completely controlled  HTN - HCTZ held secondary to hypokalemia - Resume based on blood pressure trends  OSA on CPAP - Resume CPAP at night  Irritable bowel - Continue Bentyl and other meds  Depression - Continue Wellbutrin 150 XL, Lexapro 15  Hypokalemia on admission - Replaced with IV and p.o. - Resolved   DVT prophylaxis: Lovenox Code Status: Full Family Communication: Discussed with spouse and daughter at the bedside Disposition:  Status is: Observation The patient will require care spanning > 2 midnights and should be moved to inpatient because:   Requires further therapy and pain management and control    Subjective: Awake coherent no distress eating lunch was able to get up with  some pain and very slow ambulation from the chair to the restroom with OT's assistance needed 1-2+ assistance No fever no chills no nausea no vomiting  Objective: Vitals:   01/23/22 1822 01/23/22 2018 01/24/22 0108 01/24/22 0545  BP: (!) 156/80 (!) 145/68 (!) 142/85 (!) 152/79  Pulse: 96 90 97 98  Resp: '20 17 18 20  '$ Temp: 98.5 F (36.9 C)  98.6 F (37 C) 99.1 F (37.3 C)  TempSrc: Oral  Oral Oral  SpO2: 94% 99% 93% (!) 85%  Weight:      Height:        Intake/Output Summary (Last 24 hours) at 01/24/2022 0809 Last data filed at 01/24/2022 0600 Gross per 24 hour  Intake 870.6 ml  Output 350 ml  Net 520.6 ml   Filed Weights   01/23/22 1028  Weight: 65.3 kg    Examination:  EOMI NCAT no focal deficit No icterus no pallor Thick neck Mallampati 2 ROM intact to left side but can barely raise right arm Tenderness on straight leg raise on left side Abdomen is soft no rebound   Data Reviewed: personally reviewed   CBC    Component Value Date/Time   WBC 11.7 (H) 01/24/2022 0340   RBC 4.14 01/24/2022 0340   HGB 12.3 01/24/2022 0340   HCT 37.7 01/24/2022 0340   PLT 196 01/24/2022 0340   MCV 91.1 01/24/2022 0340   MCH 29.7 01/24/2022 0340   MCHC 32.6 01/24/2022 0340   RDW 14.5 01/24/2022 0340   LYMPHSABS 0.7 01/23/2022 1135   MONOABS 0.6 01/23/2022 1135   EOSABS 0.0 01/23/2022 1135  BASOSABS 0.0 01/23/2022 1135      Latest Ref Rng & Units 01/24/2022    3:40 AM 01/23/2022   11:35 AM 10/25/2021   12:28 PM  CMP  Glucose 70 - 99 mg/dL 87  97  91   BUN 8 - 23 mg/dL '14  12  11   '$ Creatinine 0.44 - 1.00 mg/dL 0.65  0.48  0.74   Sodium 135 - 145 mmol/L 130  132  138   Potassium 3.5 - 5.1 mmol/L 4.5  3.3  3.2   Chloride 98 - 111 mmol/L 100  97  100   CO2 22 - 32 mmol/L '19  24  30   '$ Calcium 8.9 - 10.3 mg/dL 8.7  8.4  9.3   Total Protein 6.5 - 8.1 g/dL  5.5  6.7   Total Bilirubin 0.3 - 1.2 mg/dL  1.0  0.7   Alkaline Phos 38 - 126 U/L  68  70   AST 15 - 41 U/L  16  12   ALT  0 - 44 U/L  16  10      Radiology Studies: DG Shoulder Right  Result Date: 01/23/2022 CLINICAL DATA:  Right shoulder pain EXAM: RIGHT SHOULDER - 2+ VIEW COMPARISON:  None Available. FINDINGS: There is no evidence of fracture or dislocation. There is no evidence of arthropathy or other focal bone abnormality. Soft tissues are unremarkable. IMPRESSION: Negative. Electronically Signed   By: Fidela Salisbury M.D.   On: 01/23/2022 20:21   DG Lumbar Spine 2-3 Views  Result Date: 01/23/2022 CLINICAL DATA:  Fall, low back pain EXAM: LUMBAR SPINE - 2-3 VIEW COMPARISON:  CT abdomen pelvis 05/19/2016 FINDINGS: Five non rib bearing segments of the lumbar spine. Superior endplate fractures of B34 and L2 are identified with approximately 50% loss of height, new from prior CT examination. No associated retropulsion. No listhesis. Remaining vertebral body heights are preserved. Mild intervertebral disc space narrowing and endplate remodeling is seen at L1-L5 in keeping changes of mild degenerative disc disease. The paraspinal soft tissues are unremarkable save for atherosclerotic calcification within the abdominal aorta. IMPRESSION: 1. Age-indeterminate superior endplate fractures of L93 and L2 with approximately 50% loss of height. No associated retropulsion or listhesis. Correlation for point tenderness would be helpful in determining acuity. If indicated, this could be further assessed with MRI examination Electronically Signed   By: Fidela Salisbury M.D.   On: 01/23/2022 20:20   DG Chest Portable 1 View  Result Date: 01/23/2022 CLINICAL DATA:  Hypoxia EXAM: PORTABLE CHEST 1 VIEW COMPARISON:  Radiograph 09/25/2017 FINDINGS: Unchanged cardiomediastinal silhouette. Streaky bibasilar lung opacities. No large pleural effusion. No evidence of pneumothorax. Thoracic spondylosis. No acute osseous abnormality. IMPRESSION: Streaky bibasilar lung opacities, could reflect scarring/bandlike atelectasis or infectious/inflammatory  process. This is new from prior, most recent exam for comparison in September 2019. Electronically Signed   By: Maurine Simmering M.D.   On: 01/23/2022 14:38     Scheduled Meds:  buPROPion  150 mg Oral Daily   enoxaparin (LOVENOX) injection  40 mg Subcutaneous Q24H   escitalopram  15 mg Oral Daily   Continuous Infusions:  0.9 % NaCl with KCl 40 mEq / L 100 mL/hr at 01/23/22 2024   azithromycin (ZITHROMAX) 500 mg in sodium chloride 0.9 % 250 mL IVPB       LOS: 0 days   Time spent: [place], MD [facility] To contact the attending provider between 7A-7P or the covering provider during after hours  7P-7A, please log into the web site [email or web address] and access using universal [place] password for that web site. If you do not have the password, please call the hospital operator.  01/24/2022, 8:09 AM

## 2022-01-25 ENCOUNTER — Inpatient Hospital Stay (HOSPITAL_COMMUNITY): Payer: Medicare Other

## 2022-01-25 DIAGNOSIS — M8580 Other specified disorders of bone density and structure, unspecified site: Secondary | ICD-10-CM | POA: Diagnosis present

## 2022-01-25 DIAGNOSIS — W19XXXA Unspecified fall, initial encounter: Secondary | ICD-10-CM | POA: Diagnosis not present

## 2022-01-25 DIAGNOSIS — I10 Essential (primary) hypertension: Secondary | ICD-10-CM | POA: Diagnosis present

## 2022-01-25 DIAGNOSIS — Z825 Family history of asthma and other chronic lower respiratory diseases: Secondary | ICD-10-CM | POA: Diagnosis not present

## 2022-01-25 DIAGNOSIS — W1830XA Fall on same level, unspecified, initial encounter: Secondary | ICD-10-CM | POA: Diagnosis present

## 2022-01-25 DIAGNOSIS — G4733 Obstructive sleep apnea (adult) (pediatric): Secondary | ICD-10-CM | POA: Diagnosis present

## 2022-01-25 DIAGNOSIS — E860 Dehydration: Secondary | ICD-10-CM | POA: Diagnosis present

## 2022-01-25 DIAGNOSIS — E7800 Pure hypercholesterolemia, unspecified: Secondary | ICD-10-CM | POA: Diagnosis present

## 2022-01-25 DIAGNOSIS — S32029A Unspecified fracture of second lumbar vertebra, initial encounter for closed fracture: Secondary | ICD-10-CM | POA: Diagnosis present

## 2022-01-25 DIAGNOSIS — Z1152 Encounter for screening for COVID-19: Secondary | ICD-10-CM | POA: Diagnosis not present

## 2022-01-25 DIAGNOSIS — E876 Hypokalemia: Secondary | ICD-10-CM | POA: Diagnosis present

## 2022-01-25 DIAGNOSIS — Z9841 Cataract extraction status, right eye: Secondary | ICD-10-CM | POA: Diagnosis not present

## 2022-01-25 DIAGNOSIS — M797 Fibromyalgia: Secondary | ICD-10-CM | POA: Diagnosis present

## 2022-01-25 DIAGNOSIS — K589 Irritable bowel syndrome without diarrhea: Secondary | ICD-10-CM | POA: Diagnosis present

## 2022-01-25 DIAGNOSIS — G9341 Metabolic encephalopathy: Secondary | ICD-10-CM

## 2022-01-25 DIAGNOSIS — R0902 Hypoxemia: Secondary | ICD-10-CM | POA: Diagnosis present

## 2022-01-25 DIAGNOSIS — E538 Deficiency of other specified B group vitamins: Secondary | ICD-10-CM | POA: Diagnosis present

## 2022-01-25 DIAGNOSIS — S22080A Wedge compression fracture of T11-T12 vertebra, initial encounter for closed fracture: Secondary | ICD-10-CM | POA: Diagnosis not present

## 2022-01-25 DIAGNOSIS — R296 Repeated falls: Secondary | ICD-10-CM | POA: Diagnosis present

## 2022-01-25 DIAGNOSIS — K3184 Gastroparesis: Secondary | ICD-10-CM | POA: Diagnosis present

## 2022-01-25 DIAGNOSIS — S22089A Unspecified fracture of T11-T12 vertebra, initial encounter for closed fracture: Secondary | ICD-10-CM | POA: Diagnosis present

## 2022-01-25 DIAGNOSIS — H9192 Unspecified hearing loss, left ear: Secondary | ICD-10-CM | POA: Diagnosis present

## 2022-01-25 DIAGNOSIS — J309 Allergic rhinitis, unspecified: Secondary | ICD-10-CM | POA: Diagnosis present

## 2022-01-25 DIAGNOSIS — Z9842 Cataract extraction status, left eye: Secondary | ICD-10-CM | POA: Diagnosis not present

## 2022-01-25 DIAGNOSIS — Z961 Presence of intraocular lens: Secondary | ICD-10-CM | POA: Diagnosis present

## 2022-01-25 DIAGNOSIS — J9811 Atelectasis: Secondary | ICD-10-CM | POA: Diagnosis present

## 2022-01-25 DIAGNOSIS — F411 Generalized anxiety disorder: Secondary | ICD-10-CM | POA: Diagnosis present

## 2022-01-25 DIAGNOSIS — S32010A Wedge compression fracture of first lumbar vertebra, initial encounter for closed fracture: Secondary | ICD-10-CM

## 2022-01-25 DIAGNOSIS — Y92009 Unspecified place in unspecified non-institutional (private) residence as the place of occurrence of the external cause: Secondary | ICD-10-CM | POA: Diagnosis not present

## 2022-01-25 DIAGNOSIS — F329 Major depressive disorder, single episode, unspecified: Secondary | ICD-10-CM | POA: Diagnosis present

## 2022-01-25 DIAGNOSIS — F332 Major depressive disorder, recurrent severe without psychotic features: Secondary | ICD-10-CM

## 2022-01-25 LAB — CBC WITH DIFFERENTIAL/PLATELET
Abs Immature Granulocytes: 0.18 10*3/uL — ABNORMAL HIGH (ref 0.00–0.07)
Basophils Absolute: 0.1 10*3/uL (ref 0.0–0.1)
Basophils Relative: 0 %
Eosinophils Absolute: 0.1 10*3/uL (ref 0.0–0.5)
Eosinophils Relative: 1 %
HCT: 36 % (ref 36.0–46.0)
Hemoglobin: 11.8 g/dL — ABNORMAL LOW (ref 12.0–15.0)
Immature Granulocytes: 2 %
Lymphocytes Relative: 9 %
Lymphs Abs: 1 10*3/uL (ref 0.7–4.0)
MCH: 29.8 pg (ref 26.0–34.0)
MCHC: 32.8 g/dL (ref 30.0–36.0)
MCV: 90.9 fL (ref 80.0–100.0)
Monocytes Absolute: 1 10*3/uL (ref 0.1–1.0)
Monocytes Relative: 9 %
Neutro Abs: 9.1 10*3/uL — ABNORMAL HIGH (ref 1.7–7.7)
Neutrophils Relative %: 79 %
Platelets: 205 10*3/uL (ref 150–400)
RBC: 3.96 MIL/uL (ref 3.87–5.11)
RDW: 14.4 % (ref 11.5–15.5)
WBC: 11.4 10*3/uL — ABNORMAL HIGH (ref 4.0–10.5)
nRBC: 0 % (ref 0.0–0.2)

## 2022-01-25 LAB — VITAMIN B12: Vitamin B-12: 1231 pg/mL — ABNORMAL HIGH (ref 180–914)

## 2022-01-25 LAB — C-REACTIVE PROTEIN: CRP: 18.3 mg/dL — ABNORMAL HIGH (ref <1.0)

## 2022-01-25 LAB — COMPREHENSIVE METABOLIC PANEL
ALT: 19 U/L (ref 0–44)
AST: 15 U/L (ref 15–41)
Albumin: 3.1 g/dL — ABNORMAL LOW (ref 3.5–5.0)
Alkaline Phosphatase: 64 U/L (ref 38–126)
Anion gap: 13 (ref 5–15)
BUN: 18 mg/dL (ref 8–23)
CO2: 20 mmol/L — ABNORMAL LOW (ref 22–32)
Calcium: 8.8 mg/dL — ABNORMAL LOW (ref 8.9–10.3)
Chloride: 98 mmol/L (ref 98–111)
Creatinine, Ser: 0.63 mg/dL (ref 0.44–1.00)
GFR, Estimated: >60 mL/min (ref >60)
Glucose, Bld: 92 mg/dL (ref 70–99)
Potassium: 3.7 mmol/L (ref 3.5–5.1)
Sodium: 131 mmol/L — ABNORMAL LOW (ref 135–145)
Total Bilirubin: 1.2 mg/dL (ref 0.3–1.2)
Total Protein: 5.6 g/dL — ABNORMAL LOW (ref 6.5–8.1)

## 2022-01-25 LAB — URINALYSIS, ROUTINE W REFLEX MICROSCOPIC
Bilirubin Urine: NEGATIVE
Glucose, UA: NEGATIVE mg/dL
Hgb urine dipstick: NEGATIVE
Ketones, ur: 40 mg/dL — AB
Leukocytes,Ua: NEGATIVE
Nitrite: NEGATIVE
Protein, ur: NEGATIVE mg/dL
Specific Gravity, Urine: 1.01 (ref 1.005–1.030)
pH: 6.5 (ref 5.0–8.0)

## 2022-01-25 LAB — TSH: TSH: 1.232 u[IU]/mL (ref 0.350–4.500)

## 2022-01-25 LAB — RAPID URINE DRUG SCREEN, HOSP PERFORMED
Amphetamines: NOT DETECTED
Barbiturates: NOT DETECTED
Benzodiazepines: NOT DETECTED
Cocaine: NOT DETECTED
Opiates: NOT DETECTED
Tetrahydrocannabinol: NOT DETECTED

## 2022-01-25 LAB — BLOOD GAS, VENOUS
Acid-base deficit: 3.1 mmol/L — ABNORMAL HIGH (ref 0.0–2.0)
Bicarbonate: 21.2 mmol/L (ref 20.0–28.0)
O2 Saturation: 80.2 %
Patient temperature: 37.1
pCO2, Ven: 35 mmHg — ABNORMAL LOW (ref 44–60)
pH, Ven: 7.39 (ref 7.25–7.43)
pO2, Ven: 46 mmHg — ABNORMAL HIGH (ref 32–45)

## 2022-01-25 LAB — AMMONIA: Ammonia: 17 umol/L (ref 9–35)

## 2022-01-25 LAB — D-DIMER, QUANTITATIVE: D-Dimer, Quant: 0.83 ug/mL-FEU — ABNORMAL HIGH (ref 0.00–0.50)

## 2022-01-25 LAB — FOLATE: Folate: 18.1 ng/mL (ref >5.9)

## 2022-01-25 LAB — PROCALCITONIN: Procalcitonin: <0.1 ng/mL

## 2022-01-25 MED ORDER — HYDRALAZINE HCL 20 MG/ML IJ SOLN: 10.0000 mg | Freq: Four times a day (QID) | INTRAMUSCULAR | Status: DC | PRN

## 2022-01-25 MED ORDER — SODIUM CHLORIDE (PF) 0.9 % IJ SOLN
INTRAMUSCULAR | Status: AC
  Filled 2022-01-25: qty 50

## 2022-01-25 MED ORDER — IOHEXOL 350 MG/ML SOLN
80.0000 mL | Freq: Once | INTRAVENOUS | Status: AC | PRN
  Administered 2022-01-25: 80 mL via INTRAVENOUS

## 2022-01-25 MED ORDER — ACETAMINOPHEN 325 MG PO TABS
650.0000 mg | ORAL_TABLET | Freq: Two times a day (BID) | ORAL | Status: DC
  Administered 2022-01-26 – 2022-01-27 (×3): 650 mg via ORAL
  Filled 2022-01-25 (×3): qty 2

## 2022-01-25 MED ORDER — PREDNISONE 20 MG PO TABS
40.0000 mg | ORAL_TABLET | Freq: Every day | ORAL | Status: DC
  Administered 2022-01-26 – 2022-01-27 (×2): 40 mg via ORAL
  Filled 2022-01-25 (×2): qty 2

## 2022-01-25 NOTE — NC FL2 (Signed)
[place] LEVEL OF CARE FORM     IDENTIFICATION  Patient Name: Caitlin [name] Birthdate: 12/24/1945 Sex: female Admission Date (Current Location): 01/23/2022  [facility] and [place] Number:  Herbalist and Address:  [facility],  [place], [place]      Provider Number: [id number]  Attending Physician Name and Address:  Vernelle Emerald, MD  Relative Name and Phone Number:  Terrian Ridlon (spouse) Ph: [phone]    Current Level of Care: Hospital Recommended Level of Care: [place] Prior Approval Number:    Date Approved/Denied:   PASRR Number:    Discharge Plan: SNF    Current Diagnoses: Patient Active Problem List   Diagnosis Date Noted   Acute metabolic encephalopathy 73/42/8768   Hypoxia 01/23/2022   Hypokalemia 01/23/2022   Fall with injury 01/23/2022   MCI (mild cognitive impairment) 10/22/2018   Generalized anxiety disorder 09/01/2014   Hair loss 08/12/2013   Erythema nodosum 06/05/2012   Elevated cholesterol 04/29/2012   Osteoarthritis 11/28/2011   GERD (gastroesophageal reflux disease)    Prediabetes 04/19/2011   Hypotension 06/07/2010   Vitamin B12 deficiency 05/13/2010   Gastroparesis 12/23/2008   Irritable bowel syndrome 12/23/2008   Hearing loss, left ear 11/27/2008   Leg cramps, nocturnal 11/11/2007   Back pain 10/09/2007   Obstructive sleep apnea 04/23/2007   Fatigue 03/12/2007   Diverticulosis of colon 12/10/2006   Fibromyalgia 12/10/2006   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   Hyperglycemia 08/15/2006   Essential hypertension 07/11/2006   Allergic rhinitis 07/11/2006   Diaphragmatic hernia 07/11/2006   Eczema 07/11/2006    Orientation RESPIRATION BLADDER Height & Weight     Self, Time, Situation, Place  Normal Continent Weight: 144 lb (65.3 kg) Height:  '4\' 8"'$  (142.2 cm)  BEHAVIORAL SYMPTOMS/MOOD NEUROLOGICAL BOWEL NUTRITION STATUS   (N/A)  (N/A) Continent Diet  (Regular diet)  AMBULATORY STATUS COMMUNICATION OF NEEDS Skin   Limited Assist Verbally Other (Comment) (Scab behind right ear)                       Personal Care Assistance Level of Assistance  Bathing, Feeding, Dressing Bathing Assistance: Limited assistance Feeding assistance: Independent Dressing Assistance: Limited assistance     Functional Limitations Info  Sight, Hearing, Speech Sight Info: Impaired Hearing Info: Adequate Speech Info: Adequate    SPECIAL CARE FACTORS FREQUENCY  PT (By licensed PT), OT (By licensed OT)     PT Frequency: 5x's/week OT Frequency: 5x's/week            Contractures Contractures Info: Not present    Additional Factors Info  Code Status, Psychotropic, Allergies Code Status Info: Full Allergies Info: Codeine Phosphate, Mobic (Meloxicam), Protonix (Pantoprazole Sodium), Ibuprofen, Peppermint Oil, Aleve (Naproxen), Colestipol Hcl, Omeprazole-sodium Bicarbonate, Sodium, Sulfamethoxazole Psychotropic Info: Wellbutrin, Lexapro         Current Medications (01/25/2022):  This is the current hospital active medication list Current Facility-Administered Medications  Medication Dose Route Frequency Provider Last Rate Last Admin   acetaminophen (TYLENOL) tablet 650 mg  650 mg Oral Q6H PRN Lenore Cordia, MD   650 mg at 01/25/22 1029   Or   acetaminophen (TYLENOL) suppository 650 mg  650 mg Rectal Q6H PRN Lenore Cordia, MD       buPROPion (WELLBUTRIN XL) 24 hr tablet 150 mg  150 mg Oral Daily Zada Finders R, MD   150 mg at 01/25/22 0921   dicyclomine (BENTYL)  capsule 10 mg  10 mg Oral Q8H PRN Zada Finders R, MD       enoxaparin (LOVENOX) injection 40 mg  40 mg Subcutaneous Q24H Zada Finders R, MD   40 mg at 01/24/22 2219   escitalopram (LEXAPRO) tablet 15 mg  15 mg Oral Daily Zada Finders R, MD   15 mg at 01/25/22 1610   ipratropium-albuterol (DUONEB) 0.5-2.5 (3) MG/3ML nebulizer solution 3 mL  3 mL Nebulization Q6H PRN Lenore Cordia, MD       ondansetron (ZOFRAN) tablet 4 mg  4 mg Oral Q6H PRN Lenore Cordia, MD       Or   ondansetron (ZOFRAN) injection 4 mg  4 mg Intravenous Q6H PRN Lenore Cordia, MD       traMADol (ULTRAM) tablet 50 mg  50 mg Oral Q12H PRN Lenore Cordia, MD   50 mg at 01/24/22 0441     Discharge Medications: Please see discharge summary for a list of discharge medications.  Relevant Imaging Results:  Relevant Lab Results:   Additional Information SSN: [id number]  Sherie Don, LCSW

## 2022-01-25 NOTE — Plan of Care (Signed)
  Problem: Education: Goal: Knowledge of General Education information will improve Description: Including pain rating scale, medication(s)/side effects and non-pharmacologic comfort measures Outcome: Progressing   Problem: Activity: Goal: Risk for activity intolerance will decrease Outcome: Progressing   Problem: Pain Managment: Goal: General experience of comfort will improve Outcome: Progressing   

## 2022-01-25 NOTE — TOC PASRR Note (Signed)
[place] Note  Patient Details  Name: Caitlin [name] Date of Birth: Nov 02, 1945  Transition of Care [facility]) CM/SW Contact:    Sherie Don, LCSW Phone Number: 01/25/2022, 1:08 PM  To Whom It May Concern:  Please be advised that this patient will require a short-term nursing home stay - anticipated 30 days or less for rehabilitation and strengthening. The plan is for return home.

## 2022-01-25 NOTE — Progress Notes (Signed)
PROGRESS NOTE   Caitlin [name]  GEZ:[id number] DOB: 03-07-1945 DOA: 01/23/2022 PCP: Burnis Medin, MD   Date of Service: the patient was seen and examined on 01/25/2022  Brief Narrative:  Caitlin [name] is a 77 y.o. female with medical history significant for HTN, IBS with diarrhea, GERD, depression, MCI, OSA on BiPAP who presented to [facility] emergency department via EMS initially with hypoxia (88% as documented by EMS) and nausea.  Several days after experiencing a fall at home.   Upon evaluation in the emergency department patient was found to have persisting hypoxia requiring supplemental oxygen.  Imaging revealed both L2 and T12 compression fractures.  Due to hypoxia and ongoing intractable pain the hospital group was called and patient was excepted for transfer to [facility] long hospital for continued medical care.       Assessment and Plan: * Hypoxia D-dimer slightly elevated and CRP modestly elevated.   Obtaining CT angiogram of the chest to evaluate her possibility of acute pulmonary embolism and also get improved imaging to determine if there is an underlying infectious process.   In the meantime, providing patient with supplemental oxygen for bouts of hypoxia  COVID, flu, RSV negative. Incentive spirometer, flutter valve, nebulizers as needed   Fall with injury Frequent falls over the last year likely due to deconditioning.   Per my discussion with the family this fall seems to be mechanical with no evidence of syncope. Since recent fall over the weekend patient has been experiencing severe low back pain with evidence of compression fractures of T12 and L2   Compression fracture of thoracolumbar vertebra, closed, initial encounter (HCC) T12 and L2 fractures Significant pain and lumbar tenderness on exam Placing patient on scheduled Tylenol twice daily As needed tramadol for breakthrough Placing patient on short course of prednisone 40 mg daily Vitamin D level If  patient fails to clinically symptomatically improve will consider IR consultation for possible kyphoplasty.  Hypokalemia Replaced  Essential hypertension Holding home regimen of hydrochlorothiazide Relaxed blood pressure targets due to advanced age and fall As needed hydralazine for markedly elevated blood pressure.   Major depressive disorder Continue Wellbutrin and Lexapro.  Obstructive sleep apnea Continue BiPAP nightly.  Irritable bowel syndrome Some recent nausea and loose stools.  Seen by GI with recent negative labs/stool testing.  Appetite has been low and she is slightly dehydrated. -IV fluid hydration overnight -Encourage oral intake -Continue Bentyl as needed     Subjective:  Patient complaining of low back pain, severe in intensity, worse with movement and seemingly associated with right lower quadrant pain.  Pain is unchanged from yesterday.  Family reports the patient additionally has exhibited very poor oral intake today.  Physical Exam:  Vitals:   01/25/22 0641 01/25/22 1154 01/25/22 2127 01/25/22 2144  BP: (!) 140/61 (!) 141/62  (!) 160/74  Pulse: 74 86 73 81  Resp: '17 18 16 17  '$ Temp: 98.3 F (36.8 C) 98.7 F (37.1 C)  98.5 F (36.9 C)  TempSrc: Oral Oral    SpO2: 91% 90% 97% 94%  Weight:      Height:         Constitutional: Awake alert and oriented x3, patient is in distress due to pain. Skin: no rashes, no lesions, good skin turgor noted. Eyes: Pupils are equally reactive to light.  No evidence of scleral icterus or conjunctival pallor.  ENMT: Moist mucous membranes noted.  Posterior pharynx clear of any exudate or lesions.   Respiratory: clear to  auscultation bilaterally, no wheezing, no crackles. Normal respiratory effort. No accessory muscle use.  Cardiovascular: Regular rate and rhythm, no murmurs / rubs / gallops. No extremity edema. 2+ pedal pulses. No carotid bruits.  Back: Notable point tenderness of the lumbar spine without crepitus or  deformity. Abdomen: Abdomen is soft and nontender.  No evidence of intra-abdominal masses.  Positive bowel sounds noted in all quadrants.   Musculoskeletal: No joint deformity upper and lower extremities. Good ROM, no contractures. Normal muscle tone.    Data Reviewed:  I have personally reviewed and interpreted labs, imaging.  Significant findings are   CBC: Recent Labs  Lab 01/23/22 1135 01/24/22 0340 01/25/22 0717  WBC 10.4 11.7* 11.4*  NEUTROABS 9.0*  --  9.1*  HGB 12.3 12.3 11.8*  HCT 36.9 37.7 36.0  MCV 87.9 91.1 90.9  PLT 208 196 710   Basic Metabolic Panel: Recent Labs  Lab 01/23/22 1135 01/23/22 1639 01/24/22 0340 01/25/22 0717  NA 132*  --  130* 131*  K 3.3*  --  4.5 3.7  CL 97*  --  100 98  CO2 24  --  19* 20*  GLUCOSE 97  --  87 92  BUN 12  --  14 18  CREATININE 0.48  --  0.65 0.63  CALCIUM 8.4*  --  8.7* 8.8*  MG  --  1.8  --   --    GFR: Estimated Creatinine Clearance: 45.9 mL/min (by C-G formula based on SCr of 0.63 mg/dL). Liver Function Tests: Recent Labs  Lab 01/23/22 1135 01/25/22 0717  AST 16 15  ALT 16 19  ALKPHOS 68 64  BILITOT 1.0 1.2  PROT 5.5* 5.6*  ALBUMIN 3.7 3.1*      Code Status:  Full code.    Severity of Illness:  The appropriate patient status for this patient is INPATIENT. Inpatient status is judged to be reasonable and necessary in order to provide the required intensity of service to ensure the patient's safety. The patient's presenting symptoms, physical exam findings, and initial radiographic and laboratory data in the context of their chronic comorbidities is felt to place them at high risk for further clinical deterioration. Furthermore, it is not anticipated that the patient will be medically stable for discharge from the hospital within 2 midnights of admission.   * I certify that at the point of admission it is my clinical judgment that the patient will require inpatient hospital care spanning beyond 2  midnights from the point of admission due to high intensity of service, high risk for further deterioration and high frequency of surveillance required.*  Time spent:  54 minutes  Author:  Vernelle Emerald MD  01/25/2022 11:15 PM

## 2022-01-25 NOTE — Assessment & Plan Note (Signed)
T12 and L2 traumatic compression fractures Pain is improved today with initiation of scheduled Tylenol and short course of systemic steroids. As needed tramadol for breakthrough Vitamin D level pending Kyphoplasty likely not necessary due to improving pain Physical therapy has evaluated the patient and feels that the patient would benefit from skilled physical therapy services in a skilled nursing facility.

## 2022-01-25 NOTE — TOC Initial Note (Signed)
Transition of Care [facility]) - Initial/Assessment Note   Patient Details  Name: Caitlin [name] MRN: [id number] Date of Birth: 1945/09/26  Transition of Care [facility]) CM/SW Contact:    Sherie Don, LCSW Phone Number: 01/25/2022, 12:52 PM  Clinical Narrative: PT evaluation recommended SNF. CSW met with patient and daughter to discuss recommendations. Patient and daughter are in agreement with SNF. CSW also called patient's husband, who is also in agreement with SNF.  FL2 done; PASRR pending. Clinical documentation uploaded to [facility] MUST for review. Initial referral faxed out. TOC awaiting bed offers and PASRR number.  Expected Discharge Plan: Skilled Nursing Facility Barriers to Discharge: Continued Medical Work up  Patient Goals and CMS Choice Patient states their goals for this hospitalization and ongoing recovery are:: Discharge to short-term rehab CMS Medicare.gov Compare Post Acute Care list provided to:: Patient Represenative (must comment) Choice offered to / list presented to : Spouse, Adult Children, Patient  Expected Discharge Plan and Services In-house Referral: Clinical Social Work Living arrangements for the past 2 months: [place]            DME Arranged: N/A DME Agency: NA  Prior Living Arrangements/Services Living arrangements for the past 2 months: [place] Lives with:: Spouse Patient language and need for interpreter reviewed:: Yes Do you feel safe going back to the place where you live?: Yes      Need for Family Participation in Patient Care: Yes (Comment) Care giver support system in place?: Yes (comment) Criminal Activity/Legal Involvement Pertinent to Current Situation/Hospitalization: No - Comment as needed  Activities of Daily Living Home Assistive Devices/Equipment: Gilford Rile (specify type) ADL Screening (condition at time of admission) Patient's cognitive ability adequate to safely complete daily activities?: Yes Is the patient deaf  or have difficulty hearing?: No Does the patient have difficulty seeing, even when wearing glasses/contacts?: No Does the patient have difficulty concentrating, remembering, or making decisions?: No Patient able to express need for assistance with ADLs?: Yes Does the patient have difficulty dressing or bathing?: No Independently performs ADLs?: Yes (appropriate for developmental age) Does the patient have difficulty walking or climbing stairs?: Yes Weakness of Legs: Both Weakness of Arms/Hands: Right  Permission Sought/Granted Permission sought to share information with : Facility Art therapist granted to share information with : Yes, Verbal Permission Granted Permission granted to share info w AGENCY: SNFs  Emotional Assessment Appearance:: Appears stated age Attitude/Demeanor/Rapport: Engaged Affect (typically observed): Accepting Alcohol / Substance Use: Not Applicable Psych Involvement: No (comment)  Admission diagnosis:  Hypoxemia [R09.02] Hypoxia [R09.02] Compression fracture of lumbar vertebra, unspecified lumbar vertebral level, initial encounter ([place]) [P71.062I] Acute metabolic encephalopathy [R48.54] Patient Active Problem List   Diagnosis Date Noted   Acute metabolic encephalopathy 62/70/3500   Hypoxia 01/23/2022   Hypokalemia 01/23/2022   Fall with injury 01/23/2022   MCI (mild cognitive impairment) 10/22/2018   Generalized anxiety disorder 09/01/2014   Hair loss 08/12/2013   Erythema nodosum 06/05/2012   Elevated cholesterol 04/29/2012   Osteoarthritis 11/28/2011   GERD (gastroesophageal reflux disease)    Prediabetes 04/19/2011   Hypotension 06/07/2010   Vitamin B12 deficiency 05/13/2010   Gastroparesis 12/23/2008   Irritable bowel syndrome 12/23/2008   Hearing loss, left ear 11/27/2008   Leg cramps, nocturnal 11/11/2007   Back pain 10/09/2007   Obstructive sleep apnea 04/23/2007   Fatigue 03/12/2007   Diverticulosis of colon  12/10/2006   Fibromyalgia 12/10/2006   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   Hyperglycemia 08/15/2006   Essential  hypertension 07/11/2006   Allergic rhinitis 07/11/2006   Diaphragmatic hernia 07/11/2006   Eczema 07/11/2006   PCP:  Burnis Medin, MD Pharmacy:   [facility] [address] [place] - [address] [place] [place] [address] Phone: [phone] Fax: [phone]  Social Determinants of Health (SDOH) Social History: [place]: No Food Insecurity (01/23/2022)  Housing: Low Risk  (01/23/2022)  Transportation Needs: No Transportation Needs (01/23/2022)  Utilities: Not At Risk (01/23/2022)  Alcohol Screen: Low Risk  (08/25/2021)  Depression (PHQ2-9): High Risk (10/26/2021)  Financial Resource Strain: Low Risk  (08/25/2021)  Physical Activity: Inactive (08/25/2021)  Social Connections: Socially Integrated (08/25/2021)  Stress: No Stress Concern Present (08/25/2021)  Tobacco Use: Low Risk  (01/23/2022)   SDOH Interventions:    Readmission Risk Interventions     No data to display

## 2022-01-26 DIAGNOSIS — W19XXXA Unspecified fall, initial encounter: Secondary | ICD-10-CM | POA: Diagnosis not present

## 2022-01-26 DIAGNOSIS — S22080A Wedge compression fracture of T11-T12 vertebra, initial encounter for closed fracture: Secondary | ICD-10-CM | POA: Diagnosis not present

## 2022-01-26 DIAGNOSIS — E876 Hypokalemia: Secondary | ICD-10-CM | POA: Diagnosis not present

## 2022-01-26 DIAGNOSIS — R0902 Hypoxemia: Secondary | ICD-10-CM | POA: Diagnosis not present

## 2022-01-26 LAB — COMPREHENSIVE METABOLIC PANEL
ALT: 17 U/L (ref 0–44)
AST: 12 U/L — ABNORMAL LOW (ref 15–41)
Albumin: 3.1 g/dL — ABNORMAL LOW (ref 3.5–5.0)
Alkaline Phosphatase: 57 U/L (ref 38–126)
Anion gap: 14 (ref 5–15)
BUN: 14 mg/dL (ref 8–23)
CO2: 18 mmol/L — ABNORMAL LOW (ref 22–32)
Calcium: 8.3 mg/dL — ABNORMAL LOW (ref 8.9–10.3)
Chloride: 101 mmol/L (ref 98–111)
Creatinine, Ser: 0.53 mg/dL (ref 0.44–1.00)
GFR, Estimated: >60 mL/min (ref >60)
Glucose, Bld: 94 mg/dL (ref 70–99)
Potassium: 3.5 mmol/L (ref 3.5–5.1)
Sodium: 133 mmol/L — ABNORMAL LOW (ref 135–145)
Total Bilirubin: 0.9 mg/dL (ref 0.3–1.2)
Total Protein: 5.2 g/dL — ABNORMAL LOW (ref 6.5–8.1)

## 2022-01-26 LAB — MAGNESIUM: Magnesium: 2.1 mg/dL (ref 1.7–2.4)

## 2022-01-26 LAB — CBC WITH DIFFERENTIAL/PLATELET
Abs Immature Granulocytes: 0.16 10*3/uL — ABNORMAL HIGH (ref 0.00–0.07)
Basophils Absolute: 0.1 10*3/uL (ref 0.0–0.1)
Basophils Relative: 1 %
Eosinophils Absolute: 0.2 10*3/uL (ref 0.0–0.5)
Eosinophils Relative: 1 %
HCT: 35.2 % — ABNORMAL LOW (ref 36.0–46.0)
Hemoglobin: 11.5 g/dL — ABNORMAL LOW (ref 12.0–15.0)
Immature Granulocytes: 2 %
Lymphocytes Relative: 10 %
Lymphs Abs: 1 10*3/uL (ref 0.7–4.0)
MCH: 30 pg (ref 26.0–34.0)
MCHC: 32.7 g/dL (ref 30.0–36.0)
MCV: 91.9 fL (ref 80.0–100.0)
Monocytes Absolute: 0.9 10*3/uL (ref 0.1–1.0)
Monocytes Relative: 9 %
Neutro Abs: 8.3 10*3/uL — ABNORMAL HIGH (ref 1.7–7.7)
Neutrophils Relative %: 77 %
Platelets: 211 10*3/uL (ref 150–400)
RBC: 3.83 MIL/uL — ABNORMAL LOW (ref 3.87–5.11)
RDW: 14.4 % (ref 11.5–15.5)
WBC: 10.7 10*3/uL — ABNORMAL HIGH (ref 4.0–10.5)
nRBC: 0 % (ref 0.0–0.2)

## 2022-01-26 LAB — BRAIN NATRIURETIC PEPTIDE: B Natriuretic Peptide: 123 pg/mL — ABNORMAL HIGH (ref 0.0–100.0)

## 2022-01-26 MED ORDER — AMLODIPINE BESYLATE 5 MG PO TABS
5.0000 mg | ORAL_TABLET | Freq: Every day | ORAL | Status: DC
  Administered 2022-01-26 – 2022-01-27 (×2): 5 mg via ORAL
  Filled 2022-01-26 (×2): qty 1

## 2022-01-26 MED ORDER — VITAMIN D3 25 MCG (1000 UNIT) PO TABS
1000.0000 [IU] | ORAL_TABLET | Freq: Every day | ORAL | Status: DC
  Administered 2022-01-26 – 2022-01-27 (×2): 1000 [IU] via ORAL
  Filled 2022-01-26 (×3): qty 1

## 2022-01-26 MED ORDER — PROSIGHT PO TABS
1.0000 | ORAL_TABLET | ORAL | Status: DC
  Administered 2022-01-26: 1 via ORAL
  Filled 2022-01-26: qty 1

## 2022-01-26 MED ORDER — SALINE SPRAY 0.65 % NA SOLN
1.0000 | Freq: Two times a day (BID) | NASAL | Status: DC
  Administered 2022-01-26 – 2022-01-27 (×3): 1 via NASAL
  Filled 2022-01-26: qty 44

## 2022-01-26 NOTE — TOC Progression Note (Signed)
Transition of Care [facility]) - Progression Note   Patient Details  Name: NUALA CHILES MRN: [id number] Date of Birth: 1946-01-03  Transition of Care [facility]) CM/SW [place], LCSW Phone Number: 01/26/2022, 1:11 PM  Clinical Narrative: CSW reviewed bed offers and Medicare star ratings with patient and her husband. They chose [facility]. CSW confirmed bed with Lexine Baton in admissions at [facility]. CSW started insurance authorization in the [facility] portal, which is pending. Reference ID # is: [id number]. PASRR number received: [id number] E. Facility aware the insurance authorization is pending. TOC awaiting insurance approval.  Expected Discharge Plan: Skilled Nursing Facility Barriers to Discharge: Insurance Authorization  Expected Discharge Plan and Services In-house Referral: Clinical Social Work Living arrangements for the past 2 months: [place]           DME Arranged: N/A DME Agency: NA  Social Determinants of Health (SDOH) Interventions SDOH Screenings   Food Insecurity: No Food Insecurity (01/23/2022)  Housing: Low Risk  (01/23/2022)  Transportation Needs: No Transportation Needs (01/23/2022)  Utilities: Not At Risk (01/23/2022)  Alcohol Screen: Low Risk  (08/25/2021)  Depression (PHQ2-9): High Risk (10/26/2021)  Financial Resource Strain: Low Risk  (08/25/2021)  Physical Activity: Inactive (08/25/2021)  Social Connections: Socially Integrated (08/25/2021)  Stress: No Stress Concern Present (08/25/2021)  Tobacco Use: Low Risk  (01/23/2022)   Readmission Risk Interventions     No data to display

## 2022-01-26 NOTE — Progress Notes (Signed)
PROGRESS NOTE   Caitlin [name]  YPP:[id number] DOB: [birth year] DOA: 01/23/2022 PCP: Burnis Medin, MD   Date of Service: the patient was seen and examined on 01/26/2022  Brief Narrative:  Caitlin [name] is a 77 y.o. female with medical history significant for HTN, IBS with diarrhea, GERD, depression, MCI, OSA on BiPAP who presented to [facility] emergency department via EMS initially with hypoxia (88% as documented by EMS) and nausea.  Several days after experiencing a fall at home.   Upon evaluation in the emergency department patient was found to have persisting hypoxia requiring supplemental oxygen.  Imaging revealed both L2 and T12 compression fractures.  Due to hypoxia and ongoing intractable pain the hospital group was called and patient was excepted for transfer to [facility] long hospital for continued medical care.       Assessment and Plan: * Hypoxia Hypoxia has spontaneously resolved, patient has been weaned off of supplemental oxygen. Workup unremarkable including negative CT angiogram of the chest, negative COVID-19, RSV and influenza PCR testing. Incentive spirometer, flutter valve, nebulizers as needed   Fall with injury Frequent falls over the last year likely due to deconditioning.   Per my discussion with the family fall that precipitated patient's presentation on this hospitalization seems to be mechanical with no evidence of syncope. Since recent fall over the weekend patient has been experiencing severe low back pain with evidence of compression fractures of T12 and L2  Family reports the patient's weakness has gradually worsened as of late and they are having significant difficulty managing the patient at home.  Patient would likely benefit from skilled nursing facility at least temporarily to regain strength.  Compression fracture of thoracolumbar vertebra, closed, initial encounter ([place]) T12 and L2 traumatic compression fractures Pain is improved today with  initiation of scheduled Tylenol and short course of systemic steroids. As needed tramadol for breakthrough Vitamin D level pending Kyphoplasty likely not necessary due to improving pain Physical therapy has evaluated the patient and feels that the patient would benefit from skilled physical therapy services in a skilled nursing facility.  Hypokalemia Replaced  Essential hypertension Holding home regimen of hydrochlorothiazide Relaxed blood pressure targets due to advanced age and fall As needed hydralazine for markedly elevated blood pressure.   Major depressive disorder Continue Wellbutrin and Lexapro.  Obstructive sleep apnea Continue BiPAP nightly.  Irritable bowel syndrome Some recent nausea and loose stools.  Seen by GI with recent negative labs/stool testing.  Appetite has been low and she is slightly dehydrated. -IV fluid hydration overnight -Encourage oral intake -Continue Bentyl as needed     Subjective:  Patient continues to complain of moderate severe low back pain although this is somewhat improved since yesterday.  Pain is sharp in quality, worse with movement and improved with rest.  Family reports the patient's associated confusion and poor oral intake seem to be improved since yesterday.  Physical Exam:  Vitals:   01/25/22 2144 01/26/22 0634 01/26/22 1240 01/26/22 2221  BP: (!) 160/74 (!) 160/73 (!) 166/72 (!) 158/76  Pulse: 81 80 76 77  Resp: '17 17 18 17  '$ Temp: 98.5 F (36.9 C) 98.2 F (36.8 C) 98.4 F (36.9 C) 98.2 F (36.8 C)  TempSrc:   Oral   SpO2: 94% 92% 93% 93%  Weight:      Height:         Constitutional: Awake alert and oriented x3, patient is currently not in any distress. Skin: no rashes, no lesions, good skin  turgor noted. Eyes: Pupils are equally reactive to light.  No evidence of scleral icterus or conjunctival pallor.  ENMT: Moist mucous membranes noted.  Posterior pharynx clear of any exudate or lesions.   Respiratory: clear to  auscultation bilaterally, no wheezing, no crackles. Normal respiratory effort. No accessory muscle use.  Cardiovascular: Regular rate and rhythm, no murmurs / rubs / gallops. No extremity edema. 2+ pedal pulses. No carotid bruits.  Back: Notable point tenderness of the lumbar spine without crepitus or deformity that is seemingly improved since yesterday. Abdomen: Mild right lower quadrant tenderness.  Abdomen is soft.  No evidence of intra-abdominal masses.  Positive bowel sounds noted in all quadrants.   Musculoskeletal: No joint deformity upper and lower extremities. Good ROM, no contractures. Normal muscle tone.    Data Reviewed:  I have personally reviewed and interpreted labs, imaging.  Significant findings are   CBC: Recent Labs  Lab 01/23/22 1135 01/24/22 0340 01/25/22 0717 01/26/22 0336  WBC 10.4 11.7* 11.4* 10.7*  NEUTROABS 9.0*  --  9.1* 8.3*  HGB 12.3 12.3 11.8* 11.5*  HCT 36.9 37.7 36.0 35.2*  MCV 87.9 91.1 90.9 91.9  PLT 208 196 205 678   Basic Metabolic Panel: Recent Labs  Lab 01/23/22 1135 01/23/22 1639 01/24/22 0340 01/25/22 0717 01/26/22 0336  NA 132*  --  130* 131* 133*  K 3.3*  --  4.5 3.7 3.5  CL 97*  --  100 98 101  CO2 24  --  19* 20* 18*  GLUCOSE 97  --  87 92 94  BUN 12  --  '14 18 14  '$ CREATININE 0.48  --  0.65 0.63 0.53  CALCIUM 8.4*  --  8.7* 8.8* 8.3*  MG  --  1.8  --   --  2.1   GFR: Estimated Creatinine Clearance: 45.9 mL/min (by C-G formula based on SCr of 0.53 mg/dL). Liver Function Tests: Recent Labs  Lab 01/23/22 1135 01/25/22 0717 01/26/22 0336  AST 16 15 12*  ALT '16 19 17  '$ ALKPHOS 68 64 57  BILITOT 1.0 1.2 0.9  PROT 5.5* 5.6* 5.2*  ALBUMIN 3.7 3.1* 3.1*      Code Status:  Full code.    Severity of Illness:  The appropriate patient status for this patient is INPATIENT. Inpatient status is judged to be reasonable and necessary in order to provide the required intensity of service to ensure the patient's safety. The  patient's presenting symptoms, physical exam findings, and initial radiographic and laboratory data in the context of their chronic comorbidities is felt to place them at high risk for further clinical deterioration. Furthermore, it is not anticipated that the patient will be medically stable for discharge from the hospital within 2 midnights of admission.   * I certify that at the point of admission it is my clinical judgment that the patient will require inpatient hospital care spanning beyond 2 midnights from the point of admission due to high intensity of service, high risk for further deterioration and high frequency of surveillance required.*  Time spent:  35 minutes  Author:  Vernelle Emerald MD  01/26/2022 10:54 PM

## 2022-01-26 NOTE — Progress Notes (Signed)
Physical Therapy Treatment Patient Details Name: Caitlin [name] MRN: [id number] DOB: [birth year] Today's Date: 01/26/2022   History of Present Illness Patient is a 77 year old female who presented with nausea. Patient reported having multiple falls with recent compression fracture of 2 vertebra. Patient was admitted with hypoxia, R shoulder pain, PMH: OSA, IBS, MDD, hypokalemia, HTN.    PT Comments    Pt very cooperative and with marked improvement noted in activity tolerance.  Pt requiring increased time but up to ambulate 150' in hall with RW and maintained O2 sats at 93% or higher on RA.   Recommendations for follow up therapy are one component of a multi-disciplinary discharge planning process, led by the attending physician.  Recommendations may be updated based on patient status, additional functional criteria and insurance authorization.  Follow Up Recommendations  Skilled nursing-short term rehab (<3 hours/day) Can patient physically be transported by private vehicle: No   Assistance Recommended at Discharge Frequent or constant Supervision/Assistance  Patient can return home with the following A little help with walking and/or transfers;Help with stairs or ramp for entrance;A little help with bathing/dressing/bathroom;Assistance with cooking/housework;Assist for transportation   Equipment Recommendations  None recommended by PT    Recommendations for Other Services       Precautions / Restrictions Precautions Precautions: Fall Precaution Comments: back precautions, monitor O2 (only uses at night at home) Restrictions Weight Bearing Restrictions: No     Mobility  Bed Mobility Overal bed mobility: Needs Assistance Bed Mobility: Supine to Sit     Supine to sit: HOB elevated, Min assist, Mod assist     General bed mobility comments: assist to bring trunk to upright and to complete rotation to EOB sitting    Transfers Overall transfer level: Needs  assistance Equipment used: Rolling walker (2 wheels) Transfers: Sit to/from Stand Sit to Stand: Min assist           General transfer comment: Steady assist with cues for use of UEs to self assist    Ambulation/Gait Ambulation/Gait assistance: Min assist Gait Distance (Feet): 150 Feet Assistive device: Rolling walker (2 wheels) Gait Pattern/deviations: Wide base of support, Decreased step length - right, Decreased step length - left, Shuffle Gait velocity: decr     General Gait Details: pt wth unsteady gait, min assist to balance and maneuver RW.   Stairs             Wheelchair Mobility    Modified Rankin (Stroke Patients Only)       Balance Overall balance assessment: Needs assistance Sitting-balance support: Feet supported, No upper extremity supported Sitting balance-Leahy Scale: Fair     Standing balance support: Single extremity supported, Reliant on assistive device for balance Standing balance-Leahy Scale: Poor                              Cognition Arousal/Alertness: Awake/alert Behavior During Therapy: WFL for tasks assessed/performed Overall Cognitive Status: History of cognitive impairments - at baseline Area of Impairment: Following commands, Safety/judgement, Problem solving                       Following Commands: Follows one step commands consistently Safety/Judgement: Decreased awareness of safety   Problem Solving: Slow processing          Exercises      General Comments        Pertinent Vitals/Pain Pain Assessment Pain Assessment: 0-10 Pain Score: 7  Pain Location: back Pain Descriptors / Indicators: Aching, Sore, Grimacing, Guarding Pain Intervention(s): Limited activity within patient's tolerance, Monitored during session, Premedicated before session    Home Living                          Prior Function            PT Goals (current goals can now be found in the care plan section)  Acute Rehab PT Goals Patient Stated Goal: be able to do more for self PT Goal Formulation: With patient Time For Goal Achievement: 02/07/22 Potential to Achieve Goals: Good Progress towards PT goals: Progressing toward goals    Frequency    Min 2X/week      PT Plan Current plan remains appropriate    Co-evaluation              AM-PAC PT "6 Clicks" Mobility   Outcome Measure  Help needed turning from your back to your side while in a flat bed without using bedrails?: A Lot Help needed moving from lying on your back to sitting on the side of a flat bed without using bedrails?: A Lot Help needed moving to and from a bed to a chair (including a wheelchair)?: A Little Help needed standing up from a chair using your arms (e.g., wheelchair or bedside chair)?: A Little Help needed to walk in hospital room?: A Little Help needed climbing 3-5 steps with a railing? : A Lot 6 Click Score: 15    End of Session Equipment Utilized During Treatment: Gait belt Activity Tolerance: Patient tolerated treatment well Patient left: in chair;with call bell/phone within reach;with chair alarm set;with family/visitor present Nurse Communication: Mobility status PT Visit Diagnosis: Unsteadiness on feet (R26.81);Other abnormalities of gait and mobility (R26.89);Muscle weakness (generalized) (M62.81);Repeated falls (R29.6)     Time: 9892-1194 PT Time Calculation (min) (ACUTE ONLY): 27 min  Charges:  $Gait Training: 23-37 mins                     [place] Pager [phone] Office [phone]    Carianne Taira 01/26/2022, 11:35 AM

## 2022-01-27 DIAGNOSIS — I10 Essential (primary) hypertension: Secondary | ICD-10-CM | POA: Diagnosis not present

## 2022-01-27 DIAGNOSIS — R0902 Hypoxemia: Secondary | ICD-10-CM | POA: Diagnosis not present

## 2022-01-27 DIAGNOSIS — E876 Hypokalemia: Secondary | ICD-10-CM | POA: Diagnosis not present

## 2022-01-27 DIAGNOSIS — W19XXXA Unspecified fall, initial encounter: Secondary | ICD-10-CM | POA: Diagnosis not present

## 2022-01-27 DIAGNOSIS — M25511 Pain in right shoulder: Secondary | ICD-10-CM

## 2022-01-27 DIAGNOSIS — G8911 Acute pain due to trauma: Secondary | ICD-10-CM

## 2022-01-27 DIAGNOSIS — G4733 Obstructive sleep apnea (adult) (pediatric): Secondary | ICD-10-CM

## 2022-01-27 LAB — CBC WITH DIFFERENTIAL/PLATELET
Abs Immature Granulocytes: 0.21 10*3/uL — ABNORMAL HIGH (ref 0.00–0.07)
Basophils Absolute: 0.1 10*3/uL (ref 0.0–0.1)
Basophils Relative: 1 %
Eosinophils Absolute: 0 10*3/uL (ref 0.0–0.5)
Eosinophils Relative: 0 %
HCT: 36.4 % (ref 36.0–46.0)
Hemoglobin: 12.2 g/dL (ref 12.0–15.0)
Immature Granulocytes: 2 %
Lymphocytes Relative: 11 %
Lymphs Abs: 1.1 10*3/uL (ref 0.7–4.0)
MCH: 29.7 pg (ref 26.0–34.0)
MCHC: 33.5 g/dL (ref 30.0–36.0)
MCV: 88.6 fL (ref 80.0–100.0)
Monocytes Absolute: 0.9 10*3/uL (ref 0.1–1.0)
Monocytes Relative: 9 %
Neutro Abs: 7.7 10*3/uL (ref 1.7–7.7)
Neutrophils Relative %: 77 %
Platelets: 240 10*3/uL (ref 150–400)
RBC: 4.11 MIL/uL (ref 3.87–5.11)
RDW: 13.9 % (ref 11.5–15.5)
WBC: 10 10*3/uL (ref 4.0–10.5)
nRBC: 0 % (ref 0.0–0.2)

## 2022-01-27 LAB — COMPREHENSIVE METABOLIC PANEL
ALT: 21 U/L (ref 0–44)
AST: 17 U/L (ref 15–41)
Albumin: 3.5 g/dL (ref 3.5–5.0)
Alkaline Phosphatase: 67 U/L (ref 38–126)
Anion gap: 12 (ref 5–15)
BUN: 11 mg/dL (ref 8–23)
CO2: 24 mmol/L (ref 22–32)
Calcium: 8.9 mg/dL (ref 8.9–10.3)
Chloride: 98 mmol/L (ref 98–111)
Creatinine, Ser: 0.53 mg/dL (ref 0.44–1.00)
GFR, Estimated: >60 mL/min (ref >60)
Glucose, Bld: 98 mg/dL (ref 70–99)
Potassium: 3 mmol/L — ABNORMAL LOW (ref 3.5–5.1)
Sodium: 134 mmol/L — ABNORMAL LOW (ref 135–145)
Total Bilirubin: 0.9 mg/dL (ref 0.3–1.2)
Total Protein: 6.1 g/dL — ABNORMAL LOW (ref 6.5–8.1)

## 2022-01-27 LAB — MAGNESIUM: Magnesium: 2.1 mg/dL (ref 1.7–2.4)

## 2022-01-27 MED ORDER — PREDNISONE 20 MG PO TABS: 40.0000 mg | ORAL_TABLET | Freq: Every day | ORAL | 0 refills | Status: AC

## 2022-01-27 MED ORDER — AMLODIPINE BESYLATE 10 MG PO TABS: 10.0000 mg | ORAL_TABLET | Freq: Every day | ORAL | Status: DC

## 2022-01-27 MED ORDER — ESCITALOPRAM OXALATE 5 MG PO TABS: 15.0000 mg | ORAL_TABLET | Freq: Every day | ORAL | Status: DC

## 2022-01-27 MED ORDER — POTASSIUM CHLORIDE CRYS ER 20 MEQ PO TBCR
40.0000 meq | EXTENDED_RELEASE_TABLET | Freq: Once | ORAL | Status: AC
  Administered 2022-01-27: 40 meq via ORAL
  Filled 2022-01-27: qty 2

## 2022-01-27 MED ORDER — TRAMADOL HCL 50 MG PO TABS: 50.0000 mg | ORAL_TABLET | Freq: Four times a day (QID) | ORAL | 0 refills | Status: AC | PRN

## 2022-01-27 MED ORDER — ACETAMINOPHEN 325 MG PO TABS: 650.0000 mg | ORAL_TABLET | Freq: Two times a day (BID) | ORAL | 0 refills | Status: AC

## 2022-01-27 MED ORDER — ACETAMINOPHEN 325 MG PO TABS: 650.0000 mg | ORAL_TABLET | Freq: Four times a day (QID) | ORAL | Status: AC | PRN

## 2022-01-27 NOTE — Plan of Care (Signed)

## 2022-01-27 NOTE — TOC Transition Note (Signed)
Transition of Care [facility]) - CM/SW Discharge Note  Patient Details  Name: Caitlin [name] MRN: [id number] Date of Birth: 01-Jul-1945  Transition of Care [facility]) CM/SW Contact:  Sherie Don, LCSW Phone Number: 01/27/2022, 10:48 AM  Clinical Narrative: Patient has bee approved for SNF at [facility] for 01/26/2022-01/31/2022. Plan auth ID # is: [id number]. Patient will go to room 509 and the number for report is [phone]. Discharge summary, discharge orders, and SNF transfer report faxed to facility in hub. Medical necessity form done; PTAR scheduled. Discharge packet completed. CSW notified patient and husband of insurance approval and discharge to [facility] today. Husband will bring patient's BiPAP to the facility. RN updated. TOC signing off.  Final next level of care: Skilled Nursing Facility Barriers to Discharge: Barriers Resolved  Patient Goals and CMS Choice CMS Medicare.gov Compare Post Acute Care list provided to:: Patient Represenative (must comment) Choice offered to / list presented to : Spouse, Adult Children, Patient  Discharge Placement PASRR number recieved: 01/26/22     Patient chooses bed at: [facility] Patient to be transferred to facility by: [facility] Name of family member notified: [name] (son) Patient and family notified of of transfer: 01/27/22  Discharge Plan and Services Additional resources added to the After Visit Summary for   In-house Referral: Clinical Social Work       DME Arranged: N/A DME Agency: NA  Social Determinants of Health ([place]) Interventions SDOH Screenings   Food Insecurity: No Food Insecurity (01/23/2022)  Housing: Low Risk  (01/23/2022)  Transportation Needs: No Transportation Needs (01/23/2022)  Utilities: Not At Risk (01/23/2022)  Alcohol Screen: Low Risk  (08/25/2021)  Depression (PHQ2-9): High Risk (10/26/2021)  Financial Resource Strain: Low Risk  (08/25/2021)  Physical Activity: Inactive (08/25/2021)  Social  Connections: Socially Integrated (08/25/2021)  Stress: No Stress Concern Present (08/25/2021)  Tobacco Use: Low Risk  (01/23/2022)   Readmission Risk Interventions     No data to display

## 2022-01-27 NOTE — Progress Notes (Signed)
Report called to [employer],  [place]. Pt alert and oriented. Immediate needs addressed. Husband at bedside. Waiting for PTAR to transport pt.

## 2022-01-27 NOTE — Discharge Instructions (Addendum)
Patient is weightbearing as tolerated and is able to ambulate using an assistive device or with assistance. Patient is to participate in physical therapy regimen per their recommendations. Patient is to consume a low-sodium diet Patient is full code Patient is suffering from compression fractures of T12 and L2 as well as right shoulder pain likely due to a rotator cuff tear.  Patient should have follow-up scheduled with [facility] in 4 weeks Patient has obstructive sleep apnea and is to be provided with her usual regimen of BiPAP nightly with sleep and with naps.

## 2022-01-27 NOTE — Discharge Summary (Addendum)
Physician Discharge Summary   Patient: Caitlin [name] MRN: [id number] DOB: 10/05/1945  Admit date:     01/23/2022  Discharge date: 01/27/22  Discharge Physician: Vernelle Emerald   PCP: Burnis Medin, MD   Recommendations at discharge:   Patient is weightbearing as tolerated and is able to ambulate using an assistive device or with assistance. Patient is to participate in physical therapy regimen per their recommendations. Patient is to consume a low-sodium diet Patient is full code Patient is suffering from compression fractures of T12 and L2 as well as right shoulder pain likely due to a rotator cuff tear.  Patient should have follow-up scheduled with [facility] in 4 weeks Patient has obstructive sleep apnea and is to be provided with her usual regimen of BiPAP nightly with sleep and with naps.  Discharge Diagnoses: Principal Problem:   Hypoxia Active Problems:   Fall with injury   Compression fracture of thoracolumbar vertebra, closed, initial encounter ([place])   Hypokalemia   Essential hypertension   Major depressive disorder   Obstructive sleep apnea   Acute pain of right shoulder due to trauma   Irritable bowel syndrome   Acute metabolic encephalopathy has been ruled out.  Resolved Problems:   * No resolved hospital problems. *   Hospital Course: Caitlin [name] is a 77 y.o. female with medical history significant for HTN, IBS with diarrhea, GERD, depression, MCI, OSA on BiPAP who presented to [facility] emergency department via EMS initially with hypoxia (88% as documented by EMS), back pain and nausea.  Several days after experiencing a fall at home.   Upon evaluation in the emergency department patient was found to have persisting hypoxia requiring supplemental oxygen.  Imaging revealed both L2 and T12 compression fractures.  Due to hypoxia and ongoing intractable pain the hospital group was called and patient was accepted for transfer to  [facility] long hospital for continued medical care.   Patient underwent a thorough evaluation during the hospitalization for her hypoxia.  CT angiogram of the chest was performed revealing no evidence of pneumonia or pulmonary embolism.  It was determined that patient's transient hypoxia was likely secondary to splinting from severe back pain due to her compression fractures.  Concerning the patient's compression fractures of T12 and L2, this seems to have developed after a fall at home over the past weekend.  Patient was evaluated in consultation by orthopedic surgery upon arrival to our facility and they recommended nonoperative management.  Patient is also complaining of right shoulder pain which is suspected to be a rotator cuff injury.  Orthopedic surgery is recommending outpatient follow-up in 4 weeks.  Concerning the patient's substantial back pain, patient had been placed on scheduled 650 mg of Tylenol twice daily.  Patient has also been initiated on a short course of prednisone 40 mg by mouth once daily for total of 5 days.  With these interventions, patient's back pain gradually improved throughout the hospitalization and patient able to ambulate and work with physical therapy services.  Physical therapy recommended skilled physical therapy services in a skilled nursing facility.  Patient was also noted to have some right lower quadrant tenderness on exam.  Hepatic function panel, lipase and urinalysis were all found to be unremarkable.  Abdominal exam was felt to be benign with point tenderness in that region without other abnormality.  Patient was tolerating oral intake without issue.  Pain seem to have been improving throughout the hospitalization and was felt to  be musculoskeletal secondary to patient's recent fall.  Patient was discharged in improved and stable condition on 01/27/2022.       Pain control - [employer] Controlled Substance Reporting System database was reviewed. and  patient was instructed, not to drive, operate heavy machinery, perform activities at heights, swimming or participation in water activities or provide baby-sitting services while on Pain, Sleep and Anxiety Medications; until their outpatient Physician has advised to do so again. Also recommended to not to take more than prescribed Pain, Sleep and Anxiety Medications.   Consultants: [facility] Procedures performed: None  Disposition: Skilled nursing facility Diet recommendation:  Discharge Diet Orders (From admission, onward)     Start     Ordered   01/27/22 0000  Diet - low sodium heart healthy        01/27/22 0933           Cardiac diet  DISCHARGE MEDICATION: Allergies as of 01/27/2022       Reactions   Codeine Phosphate Other (See Comments)   Hallucinations   Mobic [meloxicam] Diarrhea   Protonix [pantoprazole Sodium] Diarrhea, Other (See Comments)   "Leg nodules," also   Ibuprofen Diarrhea, Other (See Comments)   Tears up the stomach   Peppermint Oil Other (See Comments)   Severe heartburn   Aleve [naproxen] Nausea Only   Upset stomach if takes more than once a day   Colestipol Hcl    Patient reports reaction is "erythema multiforme"   Omeprazole-sodium Bicarbonate Diarrhea, Other (See Comments)   Severe diarrhea, stomach cramps   Sodium Other (See Comments)   No salt!   Sulfamethoxazole Diarrhea, Nausea And Vomiting        Medication List     STOP taking these medications    hydrochlorothiazide 12.5 MG tablet Commonly known as: HYDRODIURIL   IBgard 90 MG Cpcr Generic drug: Peppermint Oil   potassium chloride 10 MEQ tablet Commonly known as: KLOR-CON   PRESCRIPTION MEDICATION   Simethicone 125 MG Caps   Sodium Fluoride 5000 Sensitive 1.1-5 % Gel Generic drug: Sod Fluoride-Potassium Nitrate       TAKE these medications    acetaminophen 325 MG tablet Commonly known as: TYLENOL Take 2 tablets (650 mg total) by mouth 2 (two) times  daily for 14 days. What changed:  medication strength how much to take when to take this reasons to take this   acetaminophen 325 MG tablet Commonly known as: TYLENOL Take 2 tablets (650 mg total) by mouth every 6 (six) hours as needed for mild pain (or Fever >/= 101). What changed: You were already taking a medication with the same name, and this prescription was added. Make sure you understand how and when to take each.   amLODipine 10 MG tablet Commonly known as: NORVASC Take 1 tablet (10 mg total) by mouth daily. Start taking on: January 28, 2022   buPROPion 150 MG 24 hr tablet Commonly known as: Wellbutrin XL Take 1 tablet (150 mg total) by mouth daily.   dicyclomine 10 MG capsule Commonly known as: BENTYL Take 1 capsule (10 mg total) by mouth every 8 (eight) hours as needed for spasms.   escitalopram 5 MG tablet Commonly known as: LEXAPRO Take 3 tablets (15 mg total) by mouth daily. Start taking on: January 28, 2022 What changed:  medication strength See the new instructions.   esomeprazole 40 MG capsule Commonly known as: NexIUM Take 1 capsule (40 mg total) by mouth daily. What changed: when to take  this   predniSONE 20 MG tablet Commonly known as: DELTASONE Take 2 tablets (40 mg total) by mouth daily with breakfast for 3 days. Start taking on: January 28, 2022   PreserVision AREDS 2 Caps Take 1 capsule by mouth 2 (two) times a week.   Simply Saline 0.9 % Aers Generic drug: Saline Place 1 spray into both nostrils in the morning and at bedtime.   traMADol 50 MG tablet Commonly known as: ULTRAM Take 1 tablet (50 mg total) by mouth every 6 (six) hours as needed for up to 5 days for severe pain.   Vitamin B-12 1000 MCG Subl Place 2,000 mcg under the tongue daily.   Vitamin D3 1000 units Caps Take 1,000 Units by mouth daily.        Contact information for follow-up providers     Aggie Moats M, PA-C. Schedule an appointment as soon as possible for a  visit in 4 week(s).   Specialty: Orthopedic Surgery Contact information: [address] [place] [address] [phone]              Contact information for after-discharge care     Destination     [facility] .   Service: Skilled Nursing Contact information: [place] [phone]                     Discharge Exam: Danley Danker Weights   01/23/22 1028  Weight: 65.3 kg    Constitutional: Awake alert and oriented x3, no associated distress.   Respiratory: clear to auscultation bilaterally, no wheezing, no crackles. Normal respiratory effort. No accessory muscle use.  Cardiovascular: Regular rate and rhythm, no murmurs / rubs / gallops. No extremity edema. 2+ pedal pulses. No carotid bruits.  Abdomen: Mild right lower quadrant tenderness.  Abdomen is soft.  No evidence of intra-abdominal masses.  Positive bowel sounds noted in all quadrants.   Musculoskeletal: No joint deformity upper and lower extremities. Good ROM, no contractures. Normal muscle tone.     Condition at discharge: fair  The results of significant diagnostics from this hospitalization (including imaging, microbiology, ancillary and laboratory) are listed below for reference.   Imaging Studies: CT Angio Chest Pulmonary Embolism (PE) W or WO Contrast  Result Date: 01/25/2022 CLINICAL DATA:  Pulmonary embolism suspected. Elevated D-dimer levels. EXAM: CT ANGIOGRAPHY CHEST WITH CONTRAST TECHNIQUE: Multidetector CT imaging of the chest was performed using the standard protocol during bolus administration of intravenous contrast. Multiplanar CT image reconstructions and MIPs were obtained to evaluate the vascular anatomy. RADIATION DOSE REDUCTION: This exam was performed according to the departmental dose-optimization program which includes automated exposure control, adjustment of the mA and/or kV according to patient size and/or  use of iterative reconstruction technique. CONTRAST:  72m OMNIPAQUE IOHEXOL 350 MG/ML SOLN COMPARISON:  Chest radiographs 01/23/2022.  Abdominal CT 05/19/2016. FINDINGS: Cardiovascular: The pulmonary arteries are well opacified with contrast to the level of the subsegmental branches. There is no evidence of acute pulmonary embolism. Mild atherosclerosis of the aorta, great vessels and coronary arteries without evidence of acute systemic arterial abnormality. The heart size is normal. There is no pericardial effusion. Mediastinum/Nodes: There are no enlarged mediastinal, hilar or axillary lymph nodes.Small to moderate hiatal hernia. The thyroid gland and trachea demonstrate no significant findings. Lungs/Pleura: Trace right-greater-than-left pleural effusions with associated dependent atelectasis at both lung bases. There is additional subsegmental atelectasis within the right middle lobe. No consolidation or  pneumothorax. Upper abdomen: No acute findings are seen in the visualized upper abdomen. There is elevation of the right hemidiaphragm status post cholecystectomy. Musculoskeletal/Chest wall: Since 2018 abdominal CT, interval development of a mild superior endplate compression fracture at T12 with mild osseous retropulsion, age indeterminate. This fracture was seen on lumbar spine radiographs 01/23/2022 and is stable from that study. No other acute or significant osseous findings. Previous proximal left humeral ORIF. Review of the MIP images confirms the above findings. IMPRESSION: 1. No evidence of acute pulmonary embolism or other acute vascular findings in the chest. 2. Trace right-greater-than-left pleural effusions with associated bibasilar atelectasis. 3. Small to moderate hiatal hernia. 4. Age-indeterminate mild superior endplate compression fracture at T12, similar to lumbar spine radiographs 01/23/2022. 5.  Aortic Atherosclerosis (ICD10-I70.0). Electronically Signed   By: Richardean Sale M.D.   On:  01/25/2022 14:56   CT HEAD WO CONTRAST (5MM)  Result Date: 01/25/2022 CLINICAL DATA:  Delirium. EXAM: CT HEAD WITHOUT CONTRAST TECHNIQUE: Contiguous axial images were obtained from the base of the skull through the vertex without intravenous contrast. RADIATION DOSE REDUCTION: This exam was performed according to the departmental dose-optimization program which includes automated exposure control, adjustment of the mA and/or kV according to patient size and/or use of iterative reconstruction technique. COMPARISON:  Head CT 01/24/2021 FINDINGS: Brain: There is no evidence of an acute infarct, intracranial hemorrhage, mass, midline shift, or extra-axial fluid collection. Mild lateral and third ventriculomegaly is unchanged and favored to reflect central predominant cerebral atrophy. Periventricular white matter hypodensities are unchanged and nonspecific but compatible with mild chronic small vessel ischemic disease. Vascular: Calcified atherosclerosis at the skull base. No hyperdense vessel. Skull: No fracture or suspicious osseous lesion. Sinuses/Orbits: Visualized paranasal sinuses and mastoid air cells are clear. Bilateral cataract extraction. Other: None. IMPRESSION: 1. No evidence of acute intracranial abnormality. 2. Mild chronic small vessel ischemic disease and cerebral atrophy. Electronically Signed   By: Logan Bores M.D.   On: 01/25/2022 14:53   DG Shoulder Right  Result Date: 01/23/2022 CLINICAL DATA:  Right shoulder pain EXAM: RIGHT SHOULDER - 2+ VIEW COMPARISON:  None Available. FINDINGS: There is no evidence of fracture or dislocation. There is no evidence of arthropathy or other focal bone abnormality. Soft tissues are unremarkable. IMPRESSION: Negative. Electronically Signed   By: Fidela Salisbury M.D.   On: 01/23/2022 20:21   DG Lumbar Spine 2-3 Views  Result Date: 01/23/2022 CLINICAL DATA:  Fall, low back pain EXAM: LUMBAR SPINE - 2-3 VIEW COMPARISON:  CT abdomen pelvis 05/19/2016 FINDINGS:  Five non rib bearing segments of the lumbar spine. Superior endplate fractures of S23 and L2 are identified with approximately 50% loss of height, new from prior CT examination. No associated retropulsion. No listhesis. Remaining vertebral body heights are preserved. Mild intervertebral disc space narrowing and endplate remodeling is seen at L1-L5 in keeping changes of mild degenerative disc disease. The paraspinal soft tissues are unremarkable save for atherosclerotic calcification within the abdominal aorta. IMPRESSION: 1. Age-indeterminate superior endplate fractures of T53 and L2 with approximately 50% loss of height. No associated retropulsion or listhesis. Correlation for point tenderness would be helpful in determining acuity. If indicated, this could be further assessed with MRI examination Electronically Signed   By: Fidela Salisbury M.D.   On: 01/23/2022 20:20   DG Chest Portable 1 View  Result Date: 01/23/2022 CLINICAL DATA:  Hypoxia EXAM: PORTABLE CHEST 1 VIEW COMPARISON:  Radiograph 09/25/2017 FINDINGS: Unchanged cardiomediastinal silhouette. Streaky bibasilar lung opacities. No large  pleural effusion. No evidence of pneumothorax. Thoracic spondylosis. No acute osseous abnormality. IMPRESSION: Streaky bibasilar lung opacities, could reflect scarring/bandlike atelectasis or infectious/inflammatory process. This is new from prior, most recent exam for comparison in September 2019. Electronically Signed   By: Maurine Simmering M.D.   On: 01/23/2022 14:38    Microbiology: Results for orders placed or performed during the hospital encounter of 01/23/22  Resp panel by RT-PCR (RSV, Flu A&B, Covid) Anterior Nasal Swab     Status: None   Collection Time: 01/23/22  3:23 PM   Specimen: Anterior Nasal Swab  Result Value Ref Range Status   SARS Coronavirus 2 by RT PCR NEGATIVE NEGATIVE Final    Comment: (NOTE) SARS-CoV-2 target nucleic acids are NOT DETECTED.  The SARS-CoV-2 RNA is generally detectable in  upper respiratory specimens during the acute phase of infection. The lowest concentration of SARS-CoV-2 viral copies this assay can detect is 138 copies/mL. A negative result does not preclude SARS-Cov-2 infection and should not be used as the sole basis for treatment or other patient management decisions. A negative result may occur with  improper specimen collection/handling, submission of specimen other than nasopharyngeal swab, presence of viral mutation(s) within the areas targeted by this assay, and inadequate number of viral copies(<138 copies/mL). A negative result must be combined with clinical observations, patient history, and epidemiological information. The expected result is Negative.  Fact Sheet for Patients:  EntrepreneurPulse.com.au  Fact Sheet for Healthcare Providers:  IncredibleEmployment.be  This test is no t yet approved or cleared by the [place] FDA and  has been authorized for detection and/or diagnosis of SARS-CoV-2 by FDA under an Emergency Use Authorization (EUA). This EUA will remain  in effect (meaning this test can be used) for the duration of the COVID-19 declaration under Section 564(b)(1) of the Act, 21 U.S.C.section 360bbb-3(b)(1), unless the authorization is terminated  or revoked sooner.       Influenza A by PCR NEGATIVE NEGATIVE Final   Influenza B by PCR NEGATIVE NEGATIVE Final    Comment: (NOTE) The Xpert Xpress SARS-CoV-2/FLU/RSV plus assay is intended as an aid in the diagnosis of influenza from Nasopharyngeal swab specimens and should not be used as a sole basis for treatment. Nasal washings and aspirates are unacceptable for Xpert Xpress SARS-CoV-2/FLU/RSV testing.  Fact Sheet for Patients: EntrepreneurPulse.com.au  Fact Sheet for Healthcare Providers: IncredibleEmployment.be  This test is not yet approved or cleared by the [place] FDA and has been  authorized for detection and/or diagnosis of SARS-CoV-2 by FDA under an Emergency Use Authorization (EUA). This EUA will remain in effect (meaning this test can be used) for the duration of the COVID-19 declaration under Section 564(b)(1) of the Act, 21 U.S.C. section 360bbb-3(b)(1), unless the authorization is terminated or revoked.     Resp Syncytial Virus by PCR NEGATIVE NEGATIVE Final    Comment: (NOTE) Fact Sheet for Patients: EntrepreneurPulse.com.au  Fact Sheet for Healthcare Providers: IncredibleEmployment.be  This test is not yet approved or cleared by the [place] FDA and has been authorized for detection and/or diagnosis of SARS-CoV-2 by FDA under an Emergency Use Authorization (EUA). This EUA will remain in effect (meaning this test can be used) for the duration of the COVID-19 declaration under Section 564(b)(1) of the Act, 21 U.S.C. section 360bbb-3(b)(1), unless the authorization is terminated or revoked.  Performed at [facility], [address] [place], [place] [address]     Labs: CBC: Recent Labs  Lab 01/23/22 1135 01/24/22 0340  01/25/22 0717 01/26/22 0336 01/27/22 0334  WBC 10.4 11.7* 11.4* 10.7* 10.0  NEUTROABS 9.0*  --  9.1* 8.3* 7.7  HGB 12.3 12.3 11.8* 11.5* 12.2  HCT 36.9 37.7 36.0 35.2* 36.4  MCV 87.9 91.1 90.9 91.9 88.6  PLT 208 196 205 211 379   Basic Metabolic Panel: Recent Labs  Lab 01/23/22 1135 01/23/22 1639 01/24/22 0340 01/25/22 0717 01/26/22 0336 01/27/22 0334  NA 132*  --  130* 131* 133* 134*  K 3.3*  --  4.5 3.7 3.5 3.0*  CL 97*  --  100 98 101 98  CO2 24  --  19* 20* 18* 24  GLUCOSE 97  --  87 92 94 98  BUN 12  --  '14 18 14 11  '$ CREATININE 0.48  --  0.65 0.63 0.53 0.53  CALCIUM 8.4*  --  8.7* 8.8* 8.3* 8.9  MG  --  1.8  --   --  2.1 2.1   Liver Function Tests: Recent Labs  Lab 01/23/22 1135 01/25/22 0717 01/26/22 0336 01/27/22 0334  AST 16 15 12* 17   ALT '16 19 17 21  '$ ALKPHOS 68 64 57 67  BILITOT 1.0 1.2 0.9 0.9  PROT 5.5* 5.6* 5.2* 6.1*  ALBUMIN 3.7 3.1* 3.1* 3.5   CBG: No results for input(s): "GLUCAP" in the last 168 hours.  Discharge time spent: greater than 30 minutes.  Signed: Vernelle Emerald, MD [facility] 01/27/2022

## 2022-01-30 LAB — VITAMIN D 25 HYDROXY (VIT D DEFICIENCY, FRACTURES)

## 2022-02-08 ENCOUNTER — Encounter: Payer: Self-pay | Admitting: Family Medicine

## 2022-02-08 ENCOUNTER — Ambulatory Visit: Payer: Medicare Other | Admitting: Family Medicine

## 2022-02-08 VITALS — BP 110/74 | HR 92 | Temp 98.1°F | Wt 126.6 lb

## 2022-02-08 DIAGNOSIS — G9341 Metabolic encephalopathy: Secondary | ICD-10-CM

## 2022-02-08 DIAGNOSIS — G4733 Obstructive sleep apnea (adult) (pediatric): Secondary | ICD-10-CM

## 2022-02-08 DIAGNOSIS — M25511 Pain in right shoulder: Secondary | ICD-10-CM

## 2022-02-08 DIAGNOSIS — S22080D Wedge compression fracture of T11-T12 vertebra, subsequent encounter for fracture with routine healing: Secondary | ICD-10-CM | POA: Diagnosis not present

## 2022-02-08 DIAGNOSIS — G8911 Acute pain due to trauma: Secondary | ICD-10-CM

## 2022-02-08 DIAGNOSIS — G3184 Mild cognitive impairment, so stated: Secondary | ICD-10-CM

## 2022-02-08 DIAGNOSIS — S32010D Wedge compression fracture of first lumbar vertebra, subsequent encounter for fracture with routine healing: Secondary | ICD-10-CM

## 2022-02-08 DIAGNOSIS — I10 Essential (primary) hypertension: Secondary | ICD-10-CM | POA: Diagnosis not present

## 2022-02-08 MED ORDER — TRAMADOL HCL 50 MG PO TABS: 100.0000 mg | ORAL_TABLET | Freq: Every day | ORAL | 0 refills | Status: DC

## 2022-02-08 NOTE — Progress Notes (Signed)
   Subjective:    Patient ID: Caitlin [name], female    DOB: 10/11/1945, 77 y.o.   MRN: [id number]  HPI Here with her son to follow up a hospital stay from 01-23-22 to 01-27-22 for sequelae from a fall at several days before. That day she was walking down an incline from a building and fell forwards. She apparently never lost consciousness. She felt severe back pain, so the family took her to the walk in ortho clinic at [facility]. They than directed them to the ED. Xrays revealed compression fractures to T12 and L2. She also injured her right shoulder, but no fractures were seen. At first breathing caused her so much pain that she briefly had respiratory failure. Once her pain was controlled she could breathe normally. All her lab work was normal. EKG was normal. On 01-27-22 she was transferred to a rehab facility, and she underwent intensive PT. Then on 02-03-22 she was able to go home. Since then a family member has been with her at all times. For pain control she is taking Tylenol 500 mg TID, and she takes Tramadol at bedtime. She is breathing easily. She has not been able to eat much food however because she feels full after consuming only a small amount. Part of this is due to a 6 cm hiatal hernia that was demonstrated on a CT scan prior to this fall.    Review of Systems  Constitutional: Negative.   Respiratory: Negative.    Cardiovascular: Negative.   Gastrointestinal:  Negative for abdominal distention, abdominal pain, blood in stool, constipation, diarrhea, nausea and vomiting.  Genitourinary: Negative.   Musculoskeletal:  Positive for arthralgias and back pain.  Neurological: Negative.        Objective:   Physical Exam Constitutional:      General: She is not in acute distress.    Comments: She walks with a walker  Cardiovascular:     Rate and Rhythm: Normal rate and regular rhythm.     Pulses: Normal pulses.     Heart sounds: Normal heart sounds.  Pulmonary:     Effort: Pulmonary  effort is normal.     Breath sounds: Normal breath sounds.  Neurological:     Mental Status: She is alert and oriented to person, place, and time.           Assessment & Plan:  She is recovering from two vertebral compression fractures and a right shoulder injury. She is being set up for at home PT. She will follow up with Dr. Percell Miller on 02-13-22. She has trouble eating, so I suggested she drink 3-4 bottles of Ensure a day. She will follow up with her GI, Dr. Silverio Decamp, and she will likely need another upper endoscopy. We spent a total of ( 35  ) minutes reviewing records and discussing these issues.  Alysia Penna, MD

## 2022-02-22 DIAGNOSIS — W19XXXD Unspecified fall, subsequent encounter: Secondary | ICD-10-CM

## 2022-02-22 DIAGNOSIS — G4733 Obstructive sleep apnea (adult) (pediatric): Secondary | ICD-10-CM

## 2022-02-22 DIAGNOSIS — K219 Gastro-esophageal reflux disease without esophagitis: Secondary | ICD-10-CM

## 2022-02-22 DIAGNOSIS — G3184 Mild cognitive impairment, so stated: Secondary | ICD-10-CM

## 2022-02-22 DIAGNOSIS — F411 Generalized anxiety disorder: Secondary | ICD-10-CM

## 2022-02-22 DIAGNOSIS — M797 Fibromyalgia: Secondary | ICD-10-CM

## 2022-02-22 DIAGNOSIS — K3184 Gastroparesis: Secondary | ICD-10-CM

## 2022-02-22 DIAGNOSIS — K580 Irritable bowel syndrome with diarrhea: Secondary | ICD-10-CM

## 2022-02-22 DIAGNOSIS — S32020D Wedge compression fracture of second lumbar vertebra, subsequent encounter for fracture with routine healing: Secondary | ICD-10-CM | POA: Diagnosis not present

## 2022-02-22 DIAGNOSIS — M8580 Other specified disorders of bone density and structure, unspecified site: Secondary | ICD-10-CM | POA: Diagnosis not present

## 2022-02-22 DIAGNOSIS — F329 Major depressive disorder, single episode, unspecified: Secondary | ICD-10-CM

## 2022-02-22 DIAGNOSIS — R296 Repeated falls: Secondary | ICD-10-CM

## 2022-02-22 DIAGNOSIS — E538 Deficiency of other specified B group vitamins: Secondary | ICD-10-CM

## 2022-02-22 DIAGNOSIS — S22080D Wedge compression fracture of T11-T12 vertebra, subsequent encounter for fracture with routine healing: Secondary | ICD-10-CM | POA: Diagnosis not present

## 2022-02-22 DIAGNOSIS — I10 Essential (primary) hypertension: Secondary | ICD-10-CM | POA: Diagnosis not present

## 2022-02-22 DIAGNOSIS — M1990 Unspecified osteoarthritis, unspecified site: Secondary | ICD-10-CM

## 2022-02-22 DIAGNOSIS — Z79891 Long term (current) use of opiate analgesic: Secondary | ICD-10-CM

## 2022-02-22 DIAGNOSIS — Z9181 History of falling: Secondary | ICD-10-CM

## 2022-03-01 ENCOUNTER — Other Ambulatory Visit: Payer: Self-pay | Admitting: Internal Medicine

## 2022-03-28 ENCOUNTER — Other Ambulatory Visit: Payer: Self-pay | Admitting: Family

## 2022-04-03 NOTE — Telephone Encounter (Signed)
Patient calling to check on progress of refill.  

## 2022-04-18 ENCOUNTER — Encounter: Payer: Self-pay | Admitting: Internal Medicine

## 2022-04-19 ENCOUNTER — Other Ambulatory Visit: Payer: Self-pay | Admitting: Physician Assistant

## 2022-04-19 ENCOUNTER — Encounter: Payer: Self-pay | Admitting: Internal Medicine

## 2022-04-19 ENCOUNTER — Ambulatory Visit: Payer: Medicare Other | Admitting: Internal Medicine

## 2022-04-19 VITALS — BP 110/74 | HR 91 | Temp 97.9°F | Ht <= 58 in | Wt 109.4 lb

## 2022-04-19 DIAGNOSIS — S32010D Wedge compression fracture of first lumbar vertebra, subsequent encounter for fracture with routine healing: Secondary | ICD-10-CM

## 2022-04-19 DIAGNOSIS — E876 Hypokalemia: Secondary | ICD-10-CM

## 2022-04-19 DIAGNOSIS — M8008XA Age-related osteoporosis with current pathological fracture, vertebra(e), initial encounter for fracture: Secondary | ICD-10-CM

## 2022-04-19 DIAGNOSIS — R1310 Dysphagia, unspecified: Secondary | ICD-10-CM

## 2022-04-19 DIAGNOSIS — S22080D Wedge compression fracture of T11-T12 vertebra, subsequent encounter for fracture with routine healing: Secondary | ICD-10-CM

## 2022-04-19 DIAGNOSIS — R634 Abnormal weight loss: Secondary | ICD-10-CM

## 2022-04-19 DIAGNOSIS — Z79899 Other long term (current) drug therapy: Secondary | ICD-10-CM

## 2022-04-19 DIAGNOSIS — R350 Frequency of micturition: Secondary | ICD-10-CM

## 2022-04-19 DIAGNOSIS — S22080A Wedge compression fracture of T11-T12 vertebra, initial encounter for closed fracture: Secondary | ICD-10-CM

## 2022-04-19 DIAGNOSIS — G3184 Mild cognitive impairment, so stated: Secondary | ICD-10-CM | POA: Diagnosis not present

## 2022-04-19 LAB — POCT URINALYSIS DIPSTICK
Bilirubin, UA: POSITIVE
Blood, UA: NEGATIVE
Glucose, UA: NEGATIVE
Ketones, UA: POSITIVE
Leukocytes, UA: NEGATIVE
Nitrite, UA: NEGATIVE
Protein, UA: POSITIVE — AB
Urobilinogen, UA: 1 E.U./dL
pH, UA: 5.5 (ref 5.0–8.0)

## 2022-04-19 NOTE — Patient Instructions (Addendum)
Lets go off  the diuretic and potassium to see if   do less  pills and irritants .  Will see if our pharmacist can help go over meds , and  see how to help .   Stop  the hydrochlorothiazide  and potassium for now.  The weight loss and low potassium can make you weak.  Make appt with  Dr Delice Lesch  neurology also  let us know I  If we need med for BP will reassess.  Follow BP at home.

## 2022-04-19 NOTE — Progress Notes (Signed)
Chief Complaint  Patient presents with   Urinary Frequency   Dysphagia    HPI: Caitlin [name] 77 y.o. come in for fu complicated hx after  a fall and recovery  compression fx  Here with spouse. Back pain about 8/10 to lower using rolling walker   Plan ? Kyphoplasty cause of ongoing pain  Not taknig tramadol at this time last rx not filled cause of CS rules? Taking tylenol but has to use chewable cause swallowing pills are a problem if large. Has lost up to 40 # in all of this  difficult to swallow at times  exp potassium  and icaps vits .  Get s alternating const diarrhea still ( not new?)  no blood  Has fy Dr Delane Ginger but  delayed cause of  recnet fall and fracture.  Voide gets weak and gets hoarse .  Has urinary urgency and then can't go  Concern about  cognition from daughter  attention  BP has been good to low and no concerns  on hctz amlodipine and potassium      See note and hx of concern from daughter   in  messages  Cognitive decline and  eating avoidance  ( years  of hh and gi sx )  Depression sx  Saw dr Sarajane Jews jan 24  and and noted dec po intack and to drink 3-4 bottles ensure per day and get back with GI team  added tramadol. Last neuro note  2022  MPRESSION: This is a 77 yo RH woman with a history of hypertension, diet-controlled hyperlipidemia, depression, anxiety, GERD,who presented for worsening memory loss. MRI brain unremarkable, TSH and B12 normal. 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 MMSE today 28/30. We had discussed repeating Neurocognitive testing to assess trajectory. Follow-up in 6-8 months, she knows to call for any changes.    Thank you for allowing me to participate in her care.  Please do not hesitate to call for any questions or concerns.     Caitlin [name], M.D. 6 22   Relative to her previous evaluation, good stability was exhibited across a majority of assessments. Mild declines were exhibited across attention/concentration, executive functioning, and phonemic fluency. Depressive symptoms across mood-based questionnaires were also noted to have worsened. An improvement was seen across her copy of a complex figure, likely due to improving visual acuity post cataract surgery. Across mood-related questionnaires, she reported acute levels of severe anxiety and severe depression. She also reported mild sleep dysfunction, likely related to her history of sleep apnea and difficulty finding a good-fitting CPAP mask. Overall, the most likely etiology of reported dysfunction continues to be a combination of significant psychiatric distress (recently worsened due to complications surrounding IBS), diffuse chronic pain related to her history of fibromyalgia, and chronic sleep dysfunction.   Low b12 in past ROS: See pertinent positives and negatives per HPI. Co of dry mouth  not new  Past Medical History:  Diagnosis Date   Allergic rhinitis 07/11/2006   Back pain 10/09/2007   Blood transfusion without reported diagnosis    Diaphragmatic hernia 07/11/2006   Diverticulosis of colon    Eczema 07/11/2006   Elevated cholesterol 04/29/2012   Enteritis    Erythema nodosum    Tends to be seasonal has had a negative chest x-ray in the past negative PPD   Essential hypertension 07/11/2006   Fatigue  03/12/2007   Fibromyalgia    Gastroparesis 12/23/2008   Generalized anxiety disorder 09/01/2014   GERD (gastroesophageal reflux disease)    History of stricture and dilatation and Nissen surgery   Hearing loss, left ear 11/27/2008   History of cataract    bilateral; s/p surgery   Hyperglycemia 08/15/2006   Irritable bowel syndrome 12/23/2008   Leg cramps, nocturnal 11/11/2007   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   MCI (mild cognitive impairment) 10/22/2018   Obstructive  sleep apnea 04/23/2007   uses CPAP nightly; mask has leak   Osteoarthritis 11/28/2011   Osteopenia    Prediabetes 04/19/2011   Vitamin B12 deficiency 05/13/2010    Family History  Problem Relation Age of Onset   Depression Mother    Kidney cancer Mother    Dementia Mother        Unspecified, likely secondary to other medical condition (cancer)   Depression Father    Hiatal hernia Father    COPD Sister    Dementia Sister        Unspecified, likely secondary to other medical condition   Nephrolithiasis Other    Colon cancer Neg Hx    Esophageal cancer Neg Hx    Pancreatic cancer Neg Hx    Rectal cancer Neg Hx    Stomach cancer Neg Hx     Social History   Socioeconomic History   Marital status: Married    Spouse name: Not on file   Number of children: 1   Years of education: 15   Highest education level: Some college, no degree  Occupational History   Occupation: Retired  Tobacco Use   Smoking status: Never   Smokeless tobacco: Never  Vaping Use   Vaping Use: Never used  Substance and Sexual Activity   Alcohol use: No    Alcohol/week: 0.0 standard drinks of alcohol   Drug use: No   Sexual activity: Not on file  Other Topics Concern   Not on file  Social History Narrative   Unemployed   Married   [place] of 2    Has grandchildren out of state   No pets   Sis. died 12-01-09 COPD      Right handed   Social Determinants of Health   Financial Resource Strain: Low Risk  (08/25/2021)   Overall Financial Resource Strain (CARDIA)    Difficulty of Paying Living Expenses: Not hard at all  Food Insecurity: No Food Insecurity (01/23/2022)   Hunger Vital Sign    Worried About Running Out of Food in the Last Year: Never true    Ran Out of Food in the Last Year: Never true  Transportation Needs: No Transportation Needs (01/23/2022)   PRAPARE - Hydrologist (Medical): No    Lack of Transportation (Non-Medical): No  Physical Activity: Inactive  (08/25/2021)   Exercise Vital Sign    Days of Exercise per Week: 0 days    Minutes of Exercise per Session: 0 min  Stress: No Stress Concern Present (08/25/2021)   [place]    Feeling of Stress : Not at all  Social Connections: [place] (08/25/2021)   Social Connection and Isolation Panel [NHANES]    Frequency of Communication with Friends and Family: More than three times a week    Frequency of Social Gatherings with Friends and Family: More than three times a week    Attends Religious Services: More than 4 times per year  Active Member of Clubs or Organizations: Yes    Attends Archivist Meetings: More than 4 times per year    Marital Status: Married    Outpatient Medications Prior to Visit  Medication Sig Dispense Refill   acetaminophen (TYLENOL) 325 MG tablet Take 2 tablets (650 mg total) by mouth every 6 (six) hours as needed for mild pain (or Fever >/= 101).     buPROPion (WELLBUTRIN XL) 150 MG 24 hr tablet Take 1 tablet by mouth once daily 30 tablet 0   Cholecalciferol (VITAMIN D3) 1000 units CAPS Take 1,000 Units by mouth daily.     Cyanocobalamin (VITAMIN B-12) 1000 MCG SUBL Place 2,000 mcg under the tongue daily.     dicyclomine (BENTYL) 10 MG capsule Take 1 capsule (10 mg total) by mouth every 8 (eight) hours as needed for spasms. 270 capsule 2   escitalopram (LEXAPRO) 5 MG tablet Take 3 tablets (15 mg total) by mouth daily.     SIMPLY SALINE 0.9 % AERS Place 1 spray into both nostrils in the morning and at bedtime.     hydrochlorothiazide (HYDRODIURIL) 12.5 MG tablet Take 1 tablet by mouth once daily 90 tablet 0   potassium chloride (KLOR-CON) 10 MEQ tablet Take 10 mEq by mouth every other day.     amLODipine (NORVASC) 10 MG tablet Take 1 tablet (10 mg total) by mouth daily. (Patient not taking: Reported on 04/19/2022)     esomeprazole (NEXIUM) 40 MG capsule Take 1 capsule (40 mg total) by  mouth daily. (Patient not taking: Reported on 04/19/2022) 30 capsule 11   Multiple Vitamins-Minerals (PRESERVISION AREDS 2) CAPS Take 1 capsule by mouth 2 (two) times a week. (Patient not taking: Reported on 04/19/2022)     traMADol (ULTRAM) 50 MG tablet Take 2 tablets (100 mg total) by mouth at bedtime. (Patient not taking: Reported on 04/19/2022) 60 tablet 0   No facility-administered medications prior to visit.     EXAM:  BP 110/74 (BP Location: Left Arm, Patient Position: Sitting, Cuff Size: Normal)   Pulse 91   Temp 97.9 F (36.6 C) (Oral)   Ht 4\' 8"  (1.422 m)   Wt 109 lb 6.4 oz (49.6 kg)   SpO2 97%   BMI 24.53 kg/m   Body mass index is 24.53 kg/m. Wt Readings from Last 3 Encounters:  04/19/22 109 lb 6.4 oz (49.6 kg)  02/08/22 126 lb 9.6 oz (57.4 kg)  01/23/22 144 lb (65.3 kg)    GENERAL: vitals reviewed and listed above, alert, oriented, appears well hydrated and in no acute distress HEENT: atraumatic, conjunctiva  clear, no obvious abnormalities on inspection of external nose and ears OP : no lesion edema or exudate  NECK: no obvious masses on inspection palpation  LUNGS: clear to auscultation bilaterally, no wheezes, rales or rhonchi, good air movement CV: HRRR, no clubbing cyanosis or  peripheral edema nl cap refill  MS: moves all extremities without noticeable focal  abnormality dec muscle mass  PSYCH: pleasant and cooperative,  mild hoarse  nl speech  will smile and laugh  Lab Results  Component Value Date   WBC 10.0 01/27/2022   HGB 12.2 01/27/2022   HCT 36.4 01/27/2022   PLT 240 01/27/2022   GLUCOSE 98 01/27/2022   CHOL 164 05/07/2019   TRIG 76.0 05/07/2019   HDL 85.90 05/07/2019   LDLCALC 63 05/07/2019   ALT 21 01/27/2022   AST 17 01/27/2022   NA 134 (L) 01/27/2022   K 3.0 (  L) 01/27/2022   CL 98 01/27/2022   CREATININE 0.53 01/27/2022   BUN 11 01/27/2022   CO2 24 01/27/2022   TSH 1.232 01/25/2022   HGBA1C 5.5 05/24/2021   MICROALBUR 1.6 04/26/2011    BP Readings from Last 3 Encounters:  04/19/22 110/74  02/08/22 110/74  01/27/22 135/65  Reviewed  info sent in by daughter   ASSESSMENT AND PLAN:  Discussed the following assessment and plan:  Medication management - Plan: Basic metabolic panel, Magnesium, CBC with Differential/Platelet  Dysphagia, unspecified type - Plan: Basic metabolic panel, Magnesium, CBC with Differential/Platelet  Hypokalemia - Plan: Basic metabolic panel, Magnesium, CBC with Differential/Platelet  Weight loss  MCI (mild cognitive impairment) - Plan: Basic metabolic panel, Magnesium, CBC with Differential/Platelet  Closed compression fracture of thoracolumbar vertebra with routine healing, subsequent encounter - Plan: Basic metabolic panel, Magnesium, CBC with Differential/Platelet  Urinary frequency - Plan: POC Urinalysis Dipstick  Overall still trying to recover from fall fracture. Multiple concerns  trying to tease out   sig decline in function.  Underlying issues  GI swallowing worse since above  and perhaps food avoidance  also  is taking nexium but  meds may be hard to swallow   feels weak at times   and noted to have hypokalemia at last labs  Since bp is controlled advise stop the hctz and potassium for now and  plan fu  different plan for bp control if needed.  Will proceed with back procedure  Avoiding cns meds such as tramadol for now  Uncertain cognitive state with hx of MCi and depression and osa additive .  -Patient advised to return or notify health care team  if  new concerns arise.  Patient Instructions  Lets go off  the diuretic and potassium to see if   do less  pills and irritants .  Will see if our pharmacist can help go over meds , and  see how to help .   Stop  the hydrochlorothiazide  and potassium for now.  The weight loss and low potassium can make you weak.  Make appt with  Dr Delice Lesch  neurology also  let us know I  If we need med for BP will reassess.  Follow BP at home.       Standley Brooking. Emmerson Shuffield M.D.

## 2022-04-20 ENCOUNTER — Ambulatory Visit
Admission: RE | Admit: 2022-04-20 | Discharge: 2022-04-20 | Disposition: A | Discharge status: Home / Self Care | Payer: Medicare Other | Source: Ambulatory Visit | Attending: Physician Assistant | Admitting: Physician Assistant

## 2022-04-20 ENCOUNTER — Other Ambulatory Visit: Payer: Self-pay | Admitting: Internal Medicine

## 2022-04-20 DIAGNOSIS — S22080A Wedge compression fracture of T11-T12 vertebra, initial encounter for closed fracture: Secondary | ICD-10-CM

## 2022-04-20 DIAGNOSIS — M8008XA Age-related osteoporosis with current pathological fracture, vertebra(e), initial encounter for fracture: Secondary | ICD-10-CM

## 2022-04-20 HISTORY — PX: IR RADIOLOGIST EVAL & MGMT: IMG5224

## 2022-04-20 LAB — BASIC METABOLIC PANEL
BUN: 20 mg/dL (ref 6–23)
CO2: 25 mEq/L (ref 19–32)
Calcium: 10 mg/dL (ref 8.4–10.5)
Chloride: 98 mEq/L (ref 96–112)
Creatinine, Ser: 1 mg/dL (ref 0.40–1.20)
GFR: 54.87 mL/min — ABNORMAL LOW (ref >60.00)
Glucose, Bld: 95 mg/dL (ref 70–99)
Potassium: 3.2 mEq/L — ABNORMAL LOW (ref 3.5–5.1)
Sodium: 137 mEq/L (ref 135–145)

## 2022-04-20 LAB — CBC WITH DIFFERENTIAL/PLATELET
Basophils Absolute: 0.2 10*3/uL — ABNORMAL HIGH (ref 0.0–0.1)
Basophils Relative: 1.1 % (ref 0.0–3.0)
Eosinophils Absolute: 0.1 10*3/uL (ref 0.0–0.7)
Eosinophils Relative: 0.9 % (ref 0.0–5.0)
HCT: 42.4 % (ref 36.0–46.0)
Hemoglobin: 14.1 g/dL (ref 12.0–15.0)
Lymphocytes Relative: 17.6 % (ref 12.0–46.0)
Lymphs Abs: 2.6 10*3/uL (ref 0.7–4.0)
MCHC: 33.2 g/dL (ref 30.0–36.0)
MCV: 93.1 fl (ref 78.0–100.0)
Monocytes Absolute: 0.7 10*3/uL (ref 0.1–1.0)
Monocytes Relative: 4.9 % (ref 3.0–12.0)
Neutro Abs: 11 10*3/uL — ABNORMAL HIGH (ref 1.4–7.7)
Neutrophils Relative %: 75.5 % (ref 43.0–77.0)
Platelets: 351 10*3/uL (ref 150.0–400.0)
RBC: 4.56 Mil/uL (ref 3.87–5.11)
RDW: 15.2 % (ref 11.5–15.5)
WBC: 14.6 10*3/uL — ABNORMAL HIGH (ref 4.0–10.5)

## 2022-04-20 LAB — MAGNESIUM: Magnesium: 1.7 mg/dL (ref 1.5–2.5)

## 2022-04-20 NOTE — Consult Note (Signed)
Chief Complaint: Back pain  Referring Physician(s): Chadwell,Joshua  History of Present Illness: Caitlin [name] is a 77 y.o. female presenting as a scheduled consultation to [facility] today for evaluation for ongoing back pain related to T12 and L2 compression fractures.   Caitlin [name] is here today with her husband for the interview.   They tell me that she has been "having more falls recently" which started in early 2023.  She fell in January, which resulted in a T12 and L2 compression fracture. Original dx was made on xray 01/23/22:    She reports that she was originally treated with conservative care, rehab, pain medications.  She says that while she was originally able to participate with the rehab and home health, she has now plateau, and is unable to progress secondary to discomfort.    She scored 21/24 on the [employer] disability questionnaire.   Her pain continues to be 8/10 intensity, every day, at most times.  Worst with activity and in the car.  She describes as "heavy feeling on my back."  She loses some sleep from the pain. She is currently taking only tylenol, which only gives partial relief.  She was prescribed tramadol originally, and she describes some side effects of constipation and also of altered sensorium.    Follow up xray 04/03/22:   MRI was then completed 04/06/22:   She denies any heart history, with no MI or stroke history. She denies any chest pain with activity.   She denies any current symptoms of infection, specifically denying symptoms of UTI or pneumonia. She does, however, have a recent visit to family medicine and a CBC completed yesterday 04/20/22:     UA was negative expect for protein yesterday.   Past Medical History:  Diagnosis Date   Allergic rhinitis 07/11/2006   Back pain 10/09/2007   Blood transfusion without reported diagnosis    Diaphragmatic hernia 07/11/2006   Diverticulosis of colon    Eczema 07/11/2006   Elevated  cholesterol 04/29/2012   Enteritis    Erythema nodosum    Tends to be seasonal has had a negative chest x-ray in the past negative PPD   Essential hypertension 07/11/2006   Fatigue 03/12/2007   Fibromyalgia    Gastroparesis 12/23/2008   Generalized anxiety disorder 09/01/2014   GERD (gastroesophageal reflux disease)    History of stricture and dilatation and Nissen surgery   Hearing loss, left ear 11/27/2008   History of cataract    bilateral; s/p surgery   Hyperglycemia 08/15/2006   Irritable bowel syndrome 12/23/2008   Leg cramps, nocturnal 11/11/2007   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   MCI (mild cognitive impairment) 10/22/2018   Obstructive sleep apnea 04/23/2007   uses CPAP nightly; mask has leak   Osteoarthritis 11/28/2011   Osteopenia    Prediabetes 04/19/2011   Vitamin B12 deficiency 05/13/2010    Past Surgical History:  Procedure Laterality Date   ABDOMINAL HYSTERECTOMY     bone spur     CATARACT EXTRACTION W/ INTRAOCULAR LENS  IMPLANT, BILATERAL     CESAREAN SECTION     x 1   CHOLECYSTECTOMY     COLONOSCOPY     DENTAL SURGERY     ESOPHAGOGASTRODUODENOSCOPY     stricture and dilatation   left shoulder surgery  06/08, 07/12/17   x 2   MOUTH SURGERY Right [id number]   NISSEN FUNDOPLICATION     ROTATOR CUFF REPAIR Left    rt knee surgery  scope   UPPER GASTROINTESTINAL ENDOSCOPY      Allergies: Codeine phosphate, Mobic [meloxicam], Protonix [pantoprazole sodium], Ibuprofen, Peppermint oil, Aleve [naproxen], Colestipol hcl, Omeprazole-sodium bicarbonate, Sodium, and Sulfamethoxazole  Medications: Prior to Admission medications   Medication Sig Start Date End Date Taking? Authorizing Provider  acetaminophen (TYLENOL) 325 MG tablet Take 2 tablets (650 mg total) by mouth every 6 (six) hours as needed for mild pain (or Fever >/= 101). 01/27/22  Yes Shalhoub, Sherryll Burger, MD  buPROPion (WELLBUTRIN XL) 150 MG 24 hr tablet Take 1 tablet by mouth once daily  04/03/22  Yes Dutch Quint B, FNP  Cholecalciferol (VITAMIN D3) 1000 units CAPS Take 1,000 Units by mouth daily.   Yes [provider]  Cyanocobalamin (VITAMIN B-12) 1000 MCG SUBL Place 2,000 mcg under the tongue daily.   Yes [provider]  dicyclomine (BENTYL) 10 MG capsule Take 1 capsule (10 mg total) by mouth every 8 (eight) hours as needed for spasms. 10/24/21  Yes Vladimir Crofts, PA-C  esomeprazole (NEXIUM) 40 MG capsule Take 1 capsule (40 mg total) by mouth daily. 01/02/22  Yes Nandigam, Venia Minks, MD  Multiple Vitamins-Minerals (PRESERVISION AREDS 2) CAPS Take 1 capsule by mouth 2 (two) times a week.   Yes [provider]  SIMPLY SALINE 0.9 % AERS Place 1 spray into both nostrils in the morning and at bedtime.   Yes [provider]  amLODipine (NORVASC) 10 MG tablet Take 1 tablet (10 mg total) by mouth daily. Patient not taking: Reported on 04/19/2022 01/28/22   Vernelle Emerald, MD  escitalopram (LEXAPRO) 5 MG tablet Take 3 tablets (15 mg total) by mouth daily. Patient not taking: Reported on 04/20/2022 01/28/22   Vernelle Emerald, MD  traMADol (ULTRAM) 50 MG tablet Take 2 tablets (100 mg total) by mouth at bedtime. Patient not taking: Reported on 04/19/2022 02/08/22   Laurey Morale, MD     Family History  Problem Relation Age of Onset   Depression Mother    Kidney cancer Mother    Dementia Mother        Unspecified, likely secondary to other medical condition (cancer)   Depression Father    Hiatal hernia Father    COPD Sister    Dementia Sister        Unspecified, likely secondary to other medical condition   Nephrolithiasis Other    Colon cancer Neg Hx    Esophageal cancer Neg Hx    Pancreatic cancer Neg Hx    Rectal cancer Neg Hx    Stomach cancer Neg Hx     Social History   Socioeconomic History   Marital status: Married    Spouse name: Not on file   Number of children: 1   Years of education: 15   Highest education level: Some  college, no degree  Occupational History   Occupation: Retired  Tobacco Use   Smoking status: Never   Smokeless tobacco: Never  Vaping Use   Vaping Use: Never used  Substance and Sexual Activity   Alcohol use: No    Alcohol/week: 0.0 standard drinks of alcohol   Drug use: No   Sexual activity: Not on file  Other Topics Concern   Not on file  Social History Narrative   Unemployed   Married   [place] of 2    Has grandchildren out of state   No pets   Sis. died Dec 19, 2009 COPD      Right handed  Social Determinants of Health   Financial Resource Strain: Low Risk  (08/25/2021)   Overall Financial Resource Strain (CARDIA)    Difficulty of Paying Living Expenses: Not hard at all  Food Insecurity: No Food Insecurity (01/23/2022)   Hunger Vital Sign    Worried About Running Out of Food in the Last Year: Never true    Ran Out of Food in the Last Year: Never true  Transportation Needs: No Transportation Needs (01/23/2022)   PRAPARE - Hydrologist (Medical): No    Lack of Transportation (Non-Medical): No  Physical Activity: Inactive (08/25/2021)   Exercise Vital Sign    Days of Exercise per Week: 0 days    Minutes of Exercise per Session: 0 min  Stress: No Stress Concern Present (08/25/2021)   [place]    Feeling of Stress : Not at all  Social Connections: [place] (08/25/2021)   Social Connection and Isolation Panel [NHANES]    Frequency of Communication with Friends and Family: More than three times a week    Frequency of Social Gatherings with Friends and Family: More than three times a week    Attends Religious Services: More than 4 times per year    Active Member of [employer] or Organizations: Yes    Attends Music therapist: More than 4 times per year    Marital Status: Married      Review of Systems: A 12 point ROS discussed and pertinent positives are  indicated in the HPI above.  All other systems are negative.  Review of Systems  Vital Signs: BP 133/74   Pulse 76   Temp 98.2 F (36.8 C) (Oral)   Wt 49.4 kg Comment: per pt  SpO2 97% Comment: room air  BMI 24.44 kg/m       Physical Exam General: 77 yo female appearing stated age.  Well-developed, well-nourished.  No distress. HEENT: Atraumatic, normocephalic.  Glasses. Conjugate gaze, extra-ocular motor intact. No scleral icterus or scleral injection. No lesions on external ears, nose, lips, or gums.  Oral mucosa moist, pink.  Neck: Symmetric with no goiter enlargement.  Chest/Lungs:  Symmetric chest with inspiration/expiration.  No labored breathing.  Clear to auscultation with no wheezes, rhonchi, or rales.  Heart:  RRR, with no third heart sounds appreciated. No JVD appreciated.  Abdomen:  Soft, NT/ND, with + bowel sounds.   Genito-urinary: Deferred Neurologic: Alert & Oriented to person, place, and time.   Normal affect and insight.  Appropriate questions.  Moving all 4 extremities with gross sensory intact.  Pulse Exam:  No bruit appreciated.   MSK: No step off or crepitus.  Reproducible pain at the thoracolumbar junction in the midline.   Mallampati Score:  2  Imaging: No results found.  Labs:  CBC: Recent Labs    01/25/22 0717 01/26/22 0336 01/27/22 0334 04/19/22 1535  WBC 11.4* 10.7* 10.0 14.6*  HGB 11.8* 11.5* 12.2 14.1  HCT 36.0 35.2* 36.4 42.4  PLT 205 211 240 351.0    COAGS: No results for input(s): "INR", "APTT" in the last 8760 hours.  BMP: Recent Labs    01/24/22 0340 01/25/22 0717 01/26/22 0336 01/27/22 0334 04/19/22 1535  NA 130* 131* 133* 134* 137  K 4.5 3.7 3.5 3.0* 3.2*  CL 100 98 101 98 98  CO2 19* 20* 18* 24 25  GLUCOSE 87 92 94 98 95  BUN 14 18 14 11 20   CALCIUM  8.7* 8.8* 8.3* 8.9 10.0  CREATININE 0.65 0.63 0.53 0.53 1.00  GFRNONAA >60 >60 >60 >60  --     LIVER FUNCTION TESTS: Recent Labs    01/23/22 1135  01/25/22 0717 01/26/22 0336 01/27/22 0334  BILITOT 1.0 1.2 0.9 0.9  AST 16 15 12* 17  ALT 16 19 17 21   ALKPHOS 68 64 57 67  PROT 5.5* 5.6* 5.2* 6.1*  ALBUMIN 3.7 3.1* 3.1* 3.5    TUMOR MARKERS: No results for input(s): "AFPTM", "CEA", "CA199", "CHROMGRNA" in the last 8760 hours.  Assessment and Plan:  Caitlin Dinneen is a very pleasant 77 year old female with subacute/chronic fracture of the T12 and L2 vertebra after a fall in January.    History and exam have demonstrated the following:  Subacute to chronic fracture at both levels by MRI imaging dated 03/28/22, with ongoing edema at both levels on MRI.  Ongoing, life-style limiting pain secondary to the fractures, refractory to conservative management.  Pain on exam concordant with level of fracture, Failure of conservative therapy and pain refractory to pain mediation, and Significant disability on the [place] with 21/24 positive symptoms, reflecting significant impact/impairment of (ADLs)     ICD-10-CM Codes that Support Medical Necessity ([email or web address]?[email or web address])   M80.08XA    Age-related osteoporosis with current pathological fracture, vertebra(e), initial encounter for fracture, S22.080A    Wedge compression fracture of T12 vertebra, initial encounter for closed fracture , and S32.010A    Wedge compression fracture of L2 vertebra, initial encounter for closed fracture    She has unresolved leukocytosis, which was uncovered yesterday by her PCP on a blood draw, with UA negative for signs of infection, and no other sign of infection.    Plan:  - We will have her in for repeat CBC with diff early next week (Tuesday) to see if cleared.  If not cleared, we will likely defer treatment until cleared.  - T12 and L2 vertebral augmentation with balloon kyphoplasty, planning tentatively 04/28/22 at 315, however, pending resolution of leukocytosis    Thank you  for this interesting consult.  I greatly enjoyed meeting LEILYN JURKOWSKI and look forward to participating in their care.  A copy of this report was sent to the requesting provider on this date.  Electronically Signed: Corrie Mckusick 04/20/2022, 2:08 PM   I spent a total of  60 Minutes   in face to face in clinical consultation, greater than 50% of which was counseling/coordinating care for T12 and L2 compression fracture, possible vertebral augmentation.

## 2022-04-20 NOTE — Progress Notes (Signed)
Wbc  some that can go with infection but no evidence on exam and urine didn't look infected  no anemia Potassium still low   3.2 so stay off of the hctz as planned but  try to take the potassium  equivalent  2 x 10 meq per day for  5 days to get the level up  ( make sure she has enough potassium to take this)  Recheck bmp in  2 weeks

## 2022-04-21 ENCOUNTER — Other Ambulatory Visit: Payer: Self-pay

## 2022-04-21 DIAGNOSIS — Z79899 Other long term (current) drug therapy: Secondary | ICD-10-CM

## 2022-04-21 DIAGNOSIS — R1310 Dysphagia, unspecified: Secondary | ICD-10-CM

## 2022-04-21 DIAGNOSIS — E876 Hypokalemia: Secondary | ICD-10-CM

## 2022-04-25 ENCOUNTER — Inpatient Hospital Stay: Admission: RE | Admit: 2022-04-25 | Payer: Medicare Other | Source: Ambulatory Visit

## 2022-04-28 ENCOUNTER — Ambulatory Visit
Admission: RE | Admit: 2022-04-28 | Discharge: 2022-04-28 | Disposition: A | Discharge status: Home / Self Care | Payer: Medicare Other | Source: Ambulatory Visit | Attending: Physician Assistant | Admitting: Physician Assistant

## 2022-04-28 ENCOUNTER — Other Ambulatory Visit: Payer: Self-pay | Admitting: Physician Assistant

## 2022-04-28 ENCOUNTER — Other Ambulatory Visit: Payer: Self-pay | Admitting: Interventional Radiology

## 2022-04-28 DIAGNOSIS — M8008XA Age-related osteoporosis with current pathological fracture, vertebra(e), initial encounter for fracture: Secondary | ICD-10-CM

## 2022-04-28 DIAGNOSIS — S22080A Wedge compression fracture of T11-T12 vertebra, initial encounter for closed fracture: Secondary | ICD-10-CM

## 2022-04-28 DIAGNOSIS — S22080S Wedge compression fracture of T11-T12 vertebra, sequela: Secondary | ICD-10-CM

## 2022-04-28 DIAGNOSIS — M8008XS Age-related osteoporosis with current pathological fracture, vertebra(e), sequela: Secondary | ICD-10-CM

## 2022-04-28 HISTORY — PX: IR KYPHO THORACIC WITH BONE BIOPSY: IMG5518

## 2022-04-28 HISTORY — PX: IR KYPHO EA ADDL LEVEL THORACIC OR LUMBAR: IMG5520

## 2022-04-28 MED ORDER — MIDAZOLAM HCL 2 MG/2ML IJ SOLN
1.0000 mg | INTRAMUSCULAR | Status: DC | PRN
  Administered 2022-04-28 (×2): 1 mg via INTRAVENOUS

## 2022-04-28 MED ORDER — ONDANSETRON HCL 4 MG/2ML IJ SOLN
4.0000 mg | Freq: Once | INTRAMUSCULAR | Status: AC
  Administered 2022-04-28: 4 mg via INTRAVENOUS

## 2022-04-28 MED ORDER — CEFAZOLIN SODIUM-DEXTROSE 2-4 GM/100ML-% IV SOLN
2.0000 g | INTRAVENOUS | Status: AC
  Administered 2022-04-28: 2 g via INTRAVENOUS

## 2022-04-28 MED ORDER — KETOROLAC TROMETHAMINE 30 MG/ML IJ SOLN
30.0000 mg | Freq: Once | INTRAMUSCULAR | Status: AC
  Administered 2022-04-28: 30 mg via INTRAVENOUS

## 2022-04-28 MED ORDER — SODIUM CHLORIDE 0.9 % IV SOLN: INTRAVENOUS | Status: DC

## 2022-04-28 MED ORDER — FENTANYL CITRATE PF 50 MCG/ML IJ SOSY
25.0000 ug | PREFILLED_SYRINGE | INTRAMUSCULAR | Status: DC | PRN
  Administered 2022-04-28 (×2): 50 ug via INTRAVENOUS

## 2022-04-28 NOTE — Discharge Instructions (Signed)
Kyphoplasty Post Procedure Discharge Instructions ? ?May resume a regular diet and any medications that you routinely take (including pain medications). However, if you are taking Aspirin or an anticoagulant/blood thinner you will be told when you can resume taking these by the healthcare provider. ?No driving day of procedure. ?The day of your procedure take it easy. You may use an ice pack as needed to injection sites on back.  Ice to back 30 minutes on and 30 minutes off, as needed. ?May remove bandaids tomorrow after taking a shower. Replace daily with a clean bandaid until healed.  ?Do not lift anything heavier than a milk jug for 1-2 weeks or determined by your physician.  ?Follow up with your physician in 2 weeks. ? ? ? ?Please contact our office at [phone] for the following symptoms or if you have any questions: ? ?Fever greater than 100 degrees ?Increased swelling, pain, or redness at injection site. ?Increased back and/or leg pain ?New numbness or change in symptoms from before the procedure.  ? ? ?Thank you for visiting [facility].  ?

## 2022-04-28 NOTE — Progress Notes (Signed)
Pt back in nursing recovery area. Pt awake and alert. Pt follows commands, talks in complete sentences and has no complaints at this time. Pt will be monitored until discharged by Radiologist.

## 2022-04-28 NOTE — Progress Notes (Signed)
Dr. Milford Cage performed  a quick neuro assessment on the patients lower extremities. Pt. Sensation and movement with both lower extremities was WNL. Pt. Was cleared to be d/c'd.

## 2022-04-28 NOTE — Progress Notes (Signed)
Vascular and Interventional Radiology  PRE PROCEDURE H&P  Assessment  Plan:   Ms. JAMILET AMBROISE is a 77 y.o. year old female who will undergo T12 and L2 BALLOON KYPHOPLASTY in Interventional Radiology.  The procedure has been fully reviewed with the patient/patient's authorized representative. The risks, benefits and alternatives have been explained, and the patient/patient's authorized representative has consented to the procedure. -- The patient will accept blood products in an emergent situation. -- The patient does not have a Do Not Resuscitate order in effect.  HPI: Ms. SHAKHIA GRAMAJO is a 77 y.o. year old female subacute/chronic fracture of the T12 and L2 vertebra after a fall in January. Pt reports persistent life-style limiting pain secondary to the fractures, refractory to conservative management.  Pain on exam concordant with level of fracture, Failure of conservative therapy and pain refractory to pain mediation, and Significant disability on the [employer] Disability Questionnaire with 21/24 positive symptoms, reflecting significant impact/impairment of (ADLs) Informed consent was obtained, witnessed and placed in the patient's chart.  Imaging independently reviewed, demonstrating subacute to chronic fracture at both levels with edema on MRI L spine 03/28/22    Allergies:  Allergies  Allergen Reactions   Codeine Phosphate Other (See Comments)    Hallucinations   Mobic [Meloxicam] Diarrhea   Protonix [Pantoprazole Sodium] Diarrhea and Other (See Comments)    "Leg nodules," also   Ibuprofen Diarrhea and Other (See Comments)    Tears up the stomach   Peppermint Oil Other (See Comments)    Severe heartburn   Aleve [Naproxen] Nausea Only    Upset stomach if takes more than once a day   Colestipol Hcl     Patient reports reaction is "erythema multiforme"   Omeprazole-Sodium Bicarbonate Diarrhea and Other (See Comments)    Severe diarrhea, stomach cramps   Sodium Other  (See Comments)    No salt!   Sulfamethoxazole Diarrhea and Nausea And Vomiting    Medications:  Current Outpatient Medications on File Prior to Encounter  Medication Sig Dispense Refill   acetaminophen (TYLENOL) 325 MG tablet Take 2 tablets (650 mg total) by mouth every 6 (six) hours as needed for mild pain (or Fever >/= 101).     amLODipine (NORVASC) 10 MG tablet Take 1 tablet (10 mg total) by mouth daily. (Patient not taking: Reported on 04/19/2022)     buPROPion (WELLBUTRIN XL) 150 MG 24 hr tablet Take 1 tablet by mouth once daily 30 tablet 0   Cholecalciferol (VITAMIN D3) 1000 units CAPS Take 1,000 Units by mouth daily.     Cyanocobalamin (VITAMIN B-12) 1000 MCG SUBL Place 2,000 mcg under the tongue daily.     dicyclomine (BENTYL) 10 MG capsule Take 1 capsule (10 mg total) by mouth every 8 (eight) hours as needed for spasms. 270 capsule 2   escitalopram (LEXAPRO) 5 MG tablet Take 3 tablets (15 mg total) by mouth daily. (Patient not taking: Reported on 04/20/2022)     esomeprazole (NEXIUM) 40 MG capsule Take 1 capsule (40 mg total) by mouth daily. 30 capsule 11   Multiple Vitamins-Minerals (PRESERVISION AREDS 2) CAPS Take 1 capsule by mouth 2 (two) times a week.     SIMPLY SALINE 0.9 % AERS Place 1 spray into both nostrils in the morning and at bedtime.     traMADol (ULTRAM) 50 MG tablet Take 2 tablets (100 mg total) by mouth at bedtime. (Patient not taking: Reported on 04/19/2022) 60 tablet 0   No current facility-administered medications  on file prior to encounter.    PSH:  Past Surgical History:  Procedure Laterality Date   ABDOMINAL HYSTERECTOMY     bone spur     CATARACT EXTRACTION W/ INTRAOCULAR LENS  IMPLANT, BILATERAL     CESAREAN SECTION     x 1   CHOLECYSTECTOMY     COLONOSCOPY     DENTAL SURGERY     ESOPHAGOGASTRODUODENOSCOPY     stricture and dilatation   IR RADIOLOGIST EVAL & MGMT  04/20/2022   left shoulder surgery  06/08, 07/12/17   x 2   MOUTH SURGERY Right 06/03/2019    NISSEN FUNDOPLICATION     ROTATOR CUFF REPAIR Left    rt knee surgery     scope   UPPER GASTROINTESTINAL ENDOSCOPY      PMH:  Past Medical History:  Diagnosis Date   Allergic rhinitis 07/11/2006   Back pain 10/09/2007   Blood transfusion without reported diagnosis    Diaphragmatic hernia 07/11/2006   Diverticulosis of colon    Eczema 07/11/2006   Elevated cholesterol 04/29/2012   Enteritis    Erythema nodosum    Tends to be seasonal has had a negative chest x-ray in the past negative PPD   Essential hypertension 07/11/2006   Fatigue 03/12/2007   Fibromyalgia    Gastroparesis 12/23/2008   Generalized anxiety disorder 09/01/2014   GERD (gastroesophageal reflux disease)    History of stricture and dilatation and Nissen surgery   Hearing loss, left ear 11/27/2008   History of cataract    bilateral; s/p surgery   Hyperglycemia 08/15/2006   Irritable bowel syndrome 12/23/2008   Leg cramps, nocturnal 11/11/2007   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   MCI (mild cognitive impairment) 10/22/2018   Obstructive sleep apnea 04/23/2007   uses CPAP nightly; mask has leak   Osteoarthritis 11/28/2011   Osteopenia    Prediabetes 04/19/2011   Vitamin B12 deficiency 05/13/2010    Brief Physical Examination: Vitals:   04/28/22 0820  BP: (!) 141/75  Pulse: 86  Temp: 98 F (36.7 C)  SpO2: 97%   General: WD, WN female in NAD HEENT: Normocephalic, atraumatic Lungs: Respirations non-labored  ASA Grade: 3 Mallampati Class: 3   Roanna Banning, MD Vascular and Interventional Radiology Specialists [facility] Radiology   Pager. [phone] Clinic. [phone]

## 2022-04-28 NOTE — Procedures (Signed)
Vascular and Interventional Radiology Procedure Note  Patient: Caitlin [name] DOB: 1945-12-22 Medical Record Number: [id number] Note Date/Time: 04/28/22 9:04 AM   Performing Physician: Roanna Banning, MD Assistant(s): None  Diagnosis: Symptomatic T12 and L2 vertebral body fracture.  Procedure: T12 and L2 VERTEBRAL BODY KYPHOPLASTY  Anesthesia: Conscious Sedation Complications: None Estimated Blood Loss: Minimal Specimens: Sent for None  Findings:  Successful Fluoroscopy-guided T12 and L2 vertebral body,  bipedicular Kyphoplasty. A total of 8 mL PMMA was used. Hemostasis of the tract was achieved using Manual Pressure.  Plan: Bed rest for 1 hours.  See detailed procedure note with images in PACS. The patient tolerated the procedure well without incident or complication and was returned to Recovery in stable condition.    Roanna Banning, MD Vascular and Interventional Radiology Specialists [facility] Radiology   Pager. [phone] Clinic. [phone]

## 2022-04-30 ENCOUNTER — Other Ambulatory Visit: Payer: Self-pay | Admitting: Family

## 2022-05-01 ENCOUNTER — Telehealth (HOSPITAL_COMMUNITY): Payer: Self-pay | Admitting: Interventional Radiology

## 2022-05-01 NOTE — Progress Notes (Signed)
Vascular and Interventional Radiology  Phone Note  Patient: Caitlin [name] DOB: [birth year] Medical Record Number: [id number] Note Date/Time: 05/01/22 1:15 PM   Diagnosis: Symptomatic T12 and L2 vertebral body fracture.    I identified myself to the patient and conveyed my credentials to Caitlin [name] For medical emergencies, Pt was advised to call 911 or go to the nearest emergency room.   Assessment  Plan: 77 y.o. year old female POD 3 s/p T12 and L2 kyphoplasty. VIR reached out in courtesy follow-up. Pt is joined in the call by her husband, Caitlin [name].  They report that she is doing well. Pt reportedly had access site discomfort, worse the day after the procedure but is resolved. Pain is well controlled with Rxs.  She endorsed constipation, different from her usual bowel routine. She has OTC stool softener available. No additional or acute concern at this time.    Follow up DRI RN will conduct a telephone follow up this week. Pt to follow up with me in Clinic 1 month post op.   As part of this Telephone encounter, no in-person exam was conducted.  The patient was physically located in [place] or a state in which I am permitted to provide care. The encounter was reasonable and appropriate under the circumstances given the patient's presentation at the time.   Roanna Banning, MD Vascular and Interventional Radiology Specialists [facility] Radiology   Pager. [phone] Clinic. [phone]

## 2022-05-05 ENCOUNTER — Ambulatory Visit
Admission: RE | Admit: 2022-05-05 | Discharge: 2022-05-05 | Disposition: A | Discharge status: Home / Self Care | Payer: Medicare Other | Source: Ambulatory Visit | Attending: Interventional Radiology | Admitting: Interventional Radiology

## 2022-05-05 ENCOUNTER — Other Ambulatory Visit (INDEPENDENT_AMBULATORY_CARE_PROVIDER_SITE_OTHER): Payer: Medicare Other

## 2022-05-05 ENCOUNTER — Telehealth: Payer: Self-pay

## 2022-05-05 ENCOUNTER — Other Ambulatory Visit: Payer: Self-pay | Admitting: Interventional Radiology

## 2022-05-05 DIAGNOSIS — E876 Hypokalemia: Secondary | ICD-10-CM

## 2022-05-05 DIAGNOSIS — M546 Pain in thoracic spine: Secondary | ICD-10-CM

## 2022-05-05 LAB — BASIC METABOLIC PANEL
BUN: 15 mg/dL (ref 6–23)
CO2: 22 mEq/L (ref 19–32)
Calcium: 9.3 mg/dL (ref 8.4–10.5)
Chloride: 103 mEq/L (ref 96–112)
Creatinine, Ser: 0.79 mg/dL (ref 0.40–1.20)
GFR: 72.78 mL/min (ref >60.00)
Glucose, Bld: 83 mg/dL (ref 70–99)
Potassium: 3.5 mEq/L (ref 3.5–5.1)
Sodium: 136 mEq/L (ref 135–145)

## 2022-05-05 NOTE — Telephone Encounter (Signed)
Phone call to patient to follow up from her kyphoplasty that she had done at [facility] on 04/28/22. Spoke to the patients husband who is the patients primary caregiver. He reported that the patient "was not doing very well". He stated the patients original pain site seemed to be much better but is now complaining of pain above the procedure site. Pts mobility has not improved since the procedure. The patients spouse states he has actually been in touch with the patients PCP due to the patient "sleeping around 15 hours a day". I relayed this message to Dr. Milford Cage who ordered the patient to have a thoracic xray completed. The patients spouse has been notified of this stating he would come in today to have the x-ray done. The patient/cg has our direct number if they were to need anything.

## 2022-05-08 NOTE — Progress Notes (Signed)
Now normal  albeit low normal potassium

## 2022-05-09 ENCOUNTER — Other Ambulatory Visit: Payer: Self-pay

## 2022-05-09 MED ORDER — BUPROPION HCL ER (XL) 150 MG PO TB24: 150.0000 mg | ORAL_TABLET | Freq: Every day | ORAL | 0 refills | Status: DC

## 2022-05-09 NOTE — Telephone Encounter (Signed)
Received a Refill Authorization Request fax from [facility] pharmacy on Bupropion XL .   Please advise.

## 2022-05-09 NOTE — Telephone Encounter (Signed)
Please refill 90 days.

## 2022-05-11 ENCOUNTER — Encounter: Payer: Self-pay | Admitting: Internal Medicine

## 2022-05-11 ENCOUNTER — Other Ambulatory Visit: Payer: Self-pay | Admitting: Internal Medicine

## 2022-05-12 ENCOUNTER — Other Ambulatory Visit: Payer: Self-pay | Admitting: Internal Medicine

## 2022-05-14 NOTE — Telephone Encounter (Signed)
Thanks for the update  I suggest consider referring to psychiatrist for medication consult .  We could consider increasing the wellbutrin dose.  If patient wishes  please do referral . I see that se has seen dr Rachel Bo in past 2022  also neuro psych  and consider revisit for reevaluation.

## 2022-05-17 NOTE — Telephone Encounter (Signed)
Calling to check on progress of this refill.

## 2022-05-19 ENCOUNTER — Ambulatory Visit
Admission: RE | Admit: 2022-05-19 | Discharge: 2022-05-19 | Disposition: A | Discharge status: Home / Self Care | Payer: Medicare Other | Source: Ambulatory Visit | Attending: Interventional Radiology | Admitting: Interventional Radiology

## 2022-05-19 DIAGNOSIS — M8008XS Age-related osteoporosis with current pathological fracture, vertebra(e), sequela: Secondary | ICD-10-CM

## 2022-05-19 DIAGNOSIS — S22080S Wedge compression fracture of T11-T12 vertebra, sequela: Secondary | ICD-10-CM

## 2022-05-19 HISTORY — PX: IR RADIOLOGIST EVAL & MGMT: IMG5224

## 2022-05-19 NOTE — Progress Notes (Signed)
Reason for visit: Hx of symptomatic thoracolumbar compression deformities s/p fall.  Follow up s/p T12 and L2 vertebral body kyphoplasty, 04/28/2022   Care Team(s): Primary Care; Panosh, Neta Mends, MD  History of present illness:  [name] is a 77 y.o. female who was seen by my colleague, Dr Loreta Ave, on 04/20/22 in consultation for thoracolumbar compression fractures of T12 and L2 s/p fall in January 2024. She was symptomatic and scored very highly on the disability scale, 21/24 pts. She underwent T12 and L2 vertebral body kyphoplasty with me on 04/28/2022 and was discharged uneventfully on the same day.   VIR MD reached out to Pt in courtesy follow up on POD 3 and other than moderate access site discomfort that was adequately controlled with PO Rxs she reported that she was doing well.  She presents in follow up 3 wks post op and reports that she is doing much better.  Her access site discomfort has resolved. Her husband, Caitlin [name], reports that she has been consuming less pain medication, compared to before when she was taking tylenol every 3 hours. They would like her cleared to begin rehabilitation with physical therapy. ROS was positive for decreased appetite and chronic dysphagia. Reported 30 lbs weight loss since 12/2021   Review of Systems: Pertinent positives are indicated in the HPI above.  All other systems are negative.    Past Medical History:  Diagnosis Date   Allergic rhinitis 07/11/2006   Back pain 10/09/2007   Blood transfusion without reported diagnosis    Diaphragmatic hernia 07/11/2006   Diverticulosis of colon    Eczema 07/11/2006   Elevated cholesterol 04/29/2012   Enteritis    Erythema nodosum    Tends to be seasonal has had a negative chest x-ray in the past negative PPD   Essential hypertension 07/11/2006   Fatigue 03/12/2007   Fibromyalgia    Gastroparesis 12/23/2008   Generalized anxiety disorder 09/01/2014   GERD (gastroesophageal reflux disease)     History of stricture and dilatation and Nissen surgery   Hearing loss, left ear 11/27/2008   History of cataract    bilateral; s/p surgery   Hyperglycemia 08/15/2006   Irritable bowel syndrome 12/23/2008   Leg cramps, nocturnal 11/11/2007   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   MCI (mild cognitive impairment) 10/22/2018   Obstructive sleep apnea 04/23/2007   uses CPAP nightly; mask has leak   Osteoarthritis 11/28/2011   Osteopenia    Prediabetes 04/19/2011   Vitamin B12 deficiency 05/13/2010    Past Surgical History:  Procedure Laterality Date   ABDOMINAL HYSTERECTOMY     bone spur     CATARACT EXTRACTION W/ INTRAOCULAR LENS  IMPLANT, BILATERAL     CESAREAN SECTION     x 1   CHOLECYSTECTOMY     COLONOSCOPY     DENTAL SURGERY     ESOPHAGOGASTRODUODENOSCOPY     stricture and dilatation   IR KYPHO EA ADDL LEVEL THORACIC OR LUMBAR  04/28/2022   IR KYPHO THORACIC WITH BONE BIOPSY  04/28/2022   IR RADIOLOGIST EVAL & MGMT  04/20/2022   IR RADIOLOGIST EVAL & MGMT  05/19/2022   left shoulder surgery  06/08, 07/12/17   x 2   MOUTH SURGERY Right 06/03/2019   NISSEN FUNDOPLICATION     ROTATOR CUFF REPAIR Left    rt knee surgery     scope   UPPER GASTROINTESTINAL ENDOSCOPY      Allergies: Codeine phosphate, Mobic [meloxicam], Protonix [pantoprazole sodium], Ibuprofen, Peppermint  oil, Aleve [naproxen], Colestipol hcl, Omeprazole-sodium bicarbonate, Sodium, and Sulfamethoxazole  Medications: Prior to Admission medications   Medication Sig Start Date End Date Taking? Authorizing Provider  acetaminophen (TYLENOL) 325 MG tablet Take 2 tablets (650 mg total) by mouth every 6 (six) hours as needed for mild pain (or Fever >/= 101). 01/27/22   Shalhoub, Deno Lunger, MD  amLODipine (NORVASC) 10 MG tablet Take 1 tablet (10 mg total) by mouth daily. Patient not taking: Reported on 04/19/2022 01/28/22   Marinda Elk, MD  buPROPion (WELLBUTRIN XL) 150 MG 24 hr tablet Take 1 tablet (150 mg total) by  mouth daily. 05/09/22   Panosh, Neta Mends, MD  Cholecalciferol (VITAMIN D3) 1000 units CAPS Take 1,000 Units by mouth daily.    [provider]  Cyanocobalamin (VITAMIN B-12) 1000 MCG SUBL Place 2,000 mcg under the tongue daily.    [provider]  dicyclomine (BENTYL) 10 MG capsule Take 1 capsule (10 mg total) by mouth every 8 (eight) hours as needed for spasms. 10/24/21   Doree Albee, PA-C  escitalopram (LEXAPRO) 10 MG tablet TAKE 1 & 1/2 (ONE & ONE-HALF) TABLETS BY MOUTH ONCE DAILY 05/15/22   Worthy Rancher B, FNP  escitalopram (LEXAPRO) 5 MG tablet Take 3 tablets (15 mg total) by mouth daily. Patient not taking: Reported on 04/20/2022 01/28/22   Marinda Elk, MD  esomeprazole (NEXIUM) 40 MG capsule Take 1 capsule (40 mg total) by mouth daily. 01/02/22   Napoleon Form, MD  Multiple Vitamins-Minerals (PRESERVISION AREDS 2) CAPS Take 1 capsule by mouth 2 (two) times a week.    [provider]  potassium chloride (KLOR-CON) 10 MEQ tablet TAKE 1 TABLET BY MOUTH EVERY OTHER DAY WITH FLUID TABLET 05/17/22   Worthy Rancher B, FNP  SIMPLY SALINE 0.9 % AERS Place 1 spray into both nostrils in the morning and at bedtime.    [provider]  traMADol (ULTRAM) 50 MG tablet Take 2 tablets (100 mg total) by mouth at bedtime. Patient not taking: Reported on 04/19/2022 02/08/22   Nelwyn Salisbury, MD     Family History  Problem Relation Age of Onset   Depression Mother    Kidney cancer Mother    Dementia Mother        Unspecified, likely secondary to other medical condition (cancer)   Depression Father    Hiatal hernia Father    COPD Sister    Dementia Sister        Unspecified, likely secondary to other medical condition   Nephrolithiasis Other    Colon cancer Neg Hx    Esophageal cancer Neg Hx    Pancreatic cancer Neg Hx    Rectal cancer Neg Hx    Stomach cancer Neg Hx     Social History   Socioeconomic History   Marital status: Married    Spouse name:  Not on file   Number of children: 1   Years of education: 15   Highest education level: Some college, no degree  Occupational History   Occupation: Retired  Tobacco Use   Smoking status: Never   Smokeless tobacco: Never  Vaping Use   Vaping Use: Never used  Substance and Sexual Activity   Alcohol use: No    Alcohol/week: 0.0 standard drinks of alcohol   Drug use: No   Sexual activity: Not on file  Other Topics Concern   Not on file  Social History Narrative   Unemployed   Married  HH of 2    Has grandchildren out of state   No pets   Sis. died 11-22-2011COPD      Right handed   Social Determinants of Health   Financial Resource Strain: Low Risk  (08/25/2021)   Overall Financial Resource Strain (CARDIA)    Difficulty of Paying Living Expenses: Not hard at all  Food Insecurity: No Food Insecurity (01/23/2022)   Hunger Vital Sign    Worried About Running Out of Food in the Last Year: Never true    Ran Out of Food in the Last Year: Never true  Transportation Needs: No Transportation Needs (01/23/2022)   PRAPARE - Administrator, Civil Service (Medical): No    Lack of Transportation (Non-Medical): No  Physical Activity: Inactive (08/25/2021)   Exercise Vital Sign    Days of Exercise per Week: 0 days    Minutes of Exercise per Session: 0 min  Stress: No Stress Concern Present (08/25/2021)   [employer] of Occupational Health - Occupational Stress Questionnaire    Feeling of Stress : Not at all  Social Connections: Socially Integrated (08/25/2021)   Social Connection and Isolation Panel [NHANES]    Frequency of Communication with Friends and Family: More than three times a week    Frequency of Social Gatherings with Friends and Family: More than three times a week    Attends Religious Services: More than 4 times per year    Active Member of [employer] or Organizations: Yes    Attends Engineer, structural: More than 4 times per year    Marital Status:  Married     Vital Signs: T 98.0 F, BP 130 / 71, HR 85, Sa02 95%  Physical Exam  General: WN, NAD  CV: RRR on monitor Pulm: normal work of breathing on RA Abd: S, ND, NT MSK: Grossly normal, ambulates with walker. NT at thoracolumbar spine. Incisions healed, c/d/i Psych: Appropriate affect.   Imaging:  T12 and L2 vertebral body kyphoplasty, 04/28/2022 Images reviewed, demonstrating excellent filling in the AP and lateral projections.  No extravasation in the disk space or posteriorly into the spinal canal.     Labs:  CBC: Recent Labs    01/25/22 0717 01/26/22 0336 01/27/22 0334 04/19/22 1535  WBC 11.4* 10.7* 10.0 14.6*  HGB 11.8* 11.5* 12.2 14.1  HCT 36.0 35.2* 36.4 42.4  PLT 205 211 240 351.0    COAGS: No results for input(s): "INR", "APTT" in the last 8760 hours.  BMP: Recent Labs    01/24/22 0340 01/25/22 0717 01/26/22 0336 01/27/22 0334 04/19/22 1535 05/05/22 1300  NA 130* 131* 133* 134* 137 136  K 4.5 3.7 3.5 3.0* 3.2* 3.5  CL 100 98 101 98 98 103  CO2 19* 20* 18* 24 25 22   GLUCOSE 87 92 94 98 95 83  BUN 14 18 14 11 20 15   CALCIUM 8.7* 8.8* 8.3* 8.9 10.0 9.3  CREATININE 0.65 0.63 0.53 0.53 1.00 0.79  GFRNONAA >60 >60 >60 >60  --   --       Assessment and Plan:   77 y.o. female who suffered thoracolumbar compression fractures of T12 and L2 s/p fall in January 2024. She was symptomatic and scored very highly on the disability scale, 21/24 pts.   Pt is now s/p T12 and L2 vertebral body kyphoplasty with me on 04/28/2022   *No access site or procedural complication concern. *OK for physical therapy and rehabilitation from a VIR perspective. *Follow  up PRN.   Per CMS PQRS reporting requirements (PQRS Measure 24): Given the patient's age of greater than 50 and the fracture site (hip, distal radius, or spine), the patient should be tested for osteoporosis using DXA, and the appropriate treatment considered based on the DXA results.   Thank you  for allowing [place] to participate in the care of this Patient. Please contact the VIR Consult Team with questions, concerns, or if new issues arise.   Electronically Signed:  Roanna Banning, MD Vascular and Interventional Radiology Specialists [facility] Radiology   Pager. [phone] Clinic. [phone]  I spent a total of 25 Minutes in face to face in clinical consultation, greater than 50% of which was counseling/coordinating care for Mrs ISMA PHILP symptomatic thoracolumbar fracture management.

## 2022-05-22 ENCOUNTER — Ambulatory Visit: Payer: Medicare Other | Admitting: Pulmonary Disease

## 2022-05-22 ENCOUNTER — Encounter: Payer: Self-pay | Admitting: Internal Medicine

## 2022-05-22 ENCOUNTER — Encounter: Payer: Self-pay | Admitting: Pulmonary Disease

## 2022-05-22 VITALS — BP 102/62 | HR 63 | Ht <= 58 in | Wt 103.0 lb

## 2022-05-22 DIAGNOSIS — G4733 Obstructive sleep apnea (adult) (pediatric): Secondary | ICD-10-CM

## 2022-05-22 NOTE — Progress Notes (Signed)
Caitlin [name]    [id number]    [birth year]  Primary Care Physician:Panosh, Neta Mends, MD  Referring Physician: Madelin Headings, MD [address] [place],  [place] [address]  Chief complaint:  History of obstructive sleep apnea on BiPAP therapy Recently had a mask change  HPI:  Has had a lot of difficulty in the last few months Multiple falls Hospitalized, was in rehab Has not been able to tolerate a BiPAP  Had a fall in December and also in January Swallowing difficulty  Has ended up losing about 30 pounds  She wakes up in the middle of the night almost every 2 hours to use the bathroom  Her balance continues to be a problem  She has only tried a BiPAP for about a week and a half in the last few months and she could not tolerate it  Most recent download from the machine still showed elevated AHI  With significant weight loss, may not be having as many events but likely still having some sleep disordered breathing events  Does have a history of fibromyalgia  Study did reveal obstructive sleep apnea titrated to BiPAP of 18/14, currently on 14/10 because of intolerance to pressures  Patient with a known history of obstructive sleep apnea During recent surgery, noted to have significant desaturations, concern for sleep disordered breathing with significant Known snorer Had a sleep study done a few years back that was positive for sleep disordered breathing, no treatment was offered at the time  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   buPROPion (WELLBUTRIN XL) 150 MG 24 hr tablet Take 1 tablet (150 mg total) by mouth daily.   Cholecalciferol (VITAMIN D3) 1000 units CAPS Take 1,000 Units by mouth daily.    Cyanocobalamin (VITAMIN B-12) 1000 MCG SUBL Place 2,000 mcg under the tongue daily.   dicyclomine (BENTYL) 10 MG capsule Take 1 capsule (10 mg total) by mouth every 8 (eight) hours as needed for spasms.   escitalopram (LEXAPRO) 10 MG tablet TAKE 1 & 1/2 (ONE & ONE-HALF) TABLETS BY MOUTH ONCE DAILY   escitalopram (LEXAPRO) 5 MG tablet Take 3 tablets (15 mg total) by mouth daily.   esomeprazole (NEXIUM) 40 MG capsule Take 1 capsule (40 mg total) by mouth daily.   Multiple Vitamins-Minerals (PRESERVISION AREDS 2) CAPS Take 1 capsule by mouth 2 (two) times a week.   potassium chloride (KLOR-CON) 10 MEQ tablet TAKE 1 TABLET BY MOUTH EVERY OTHER DAY WITH FLUID TABLET   SIMPLY SALINE 0.9 % AERS Place 1 spray into both nostrils in the morning and at bedtime.   amLODipine (NORVASC) 10 MG tablet Take 1 tablet (10 mg total) by mouth daily. (Patient not taking: Reported on 05/22/2022)   traMADol (ULTRAM) 50 MG tablet Take 2 tablets (100 mg total) by mouth at bedtime. (Patient not taking: Reported on 05/22/2022)   No facility-administered encounter medications on file as of 05/22/2022.    Allergies as of 05/22/2022 - Review Complete 05/22/2022  Allergen Reaction Noted   Codeine phosphate Other (See Comments) 03/15/2006   Mobic [meloxicam] Diarrhea 11/28/2011   Protonix [pantoprazole sodium] Diarrhea and Other (See Comments) 06/05/2012   Ibuprofen Diarrhea and Other (See Comments) 01/23/2022  Peppermint oil Other (See Comments) 01/23/2022   Aleve [naproxen] Nausea Only 05/15/2016   Colestipol hcl  05/24/2017   Omeprazole-sodium bicarbonate Diarrhea and Other (See Comments) 12/23/2008   Sodium Other (See Comments) 11/16/2017   Sulfamethoxazole Diarrhea and Nausea And Vomiting 03/15/2006    Past Medical History:  Diagnosis Date   Allergic rhinitis 07/11/2006   Back pain 10/09/2007   Blood transfusion without reported diagnosis    Diaphragmatic hernia 07/11/2006   Diverticulosis of colon    Eczema  07/11/2006   Elevated cholesterol 04/29/2012   Enteritis    Erythema nodosum    Tends to be seasonal has had a negative chest x-ray in the past negative PPD   Essential hypertension 07/11/2006   Fatigue 03/12/2007   Fibromyalgia    Gastroparesis 12/23/2008   Generalized anxiety disorder 09/01/2014   GERD (gastroesophageal reflux disease)    History of stricture and dilatation and Nissen surgery   Hearing loss, left ear 11/27/2008   History of cataract    bilateral; s/p surgery   Hyperglycemia 08/15/2006   Irritable bowel syndrome 12/23/2008   Leg cramps, nocturnal 11/11/2007   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   MCI (mild cognitive impairment) 10/22/2018   Obstructive sleep apnea 04/23/2007   uses CPAP nightly; mask has leak   Osteoarthritis 11/28/2011   Osteopenia    Prediabetes 04/19/2011   Vitamin B12 deficiency 05/13/2010    Past Surgical History:  Procedure Laterality Date   ABDOMINAL HYSTERECTOMY     bone spur     CATARACT EXTRACTION W/ INTRAOCULAR LENS  IMPLANT, BILATERAL     CESAREAN SECTION     x 1   CHOLECYSTECTOMY     COLONOSCOPY     DENTAL SURGERY     ESOPHAGOGASTRODUODENOSCOPY     stricture and dilatation   IR KYPHO EA ADDL LEVEL THORACIC OR LUMBAR  04/28/2022   IR KYPHO THORACIC WITH BONE BIOPSY  04/28/2022   IR RADIOLOGIST EVAL & MGMT  04/20/2022   IR RADIOLOGIST EVAL & MGMT  05/19/2022   left shoulder surgery  06/08, 07/12/17   x 2   MOUTH SURGERY Right 06/03/2019   NISSEN FUNDOPLICATION     ROTATOR CUFF REPAIR Left    rt knee surgery     scope   UPPER GASTROINTESTINAL ENDOSCOPY      Family History  Problem Relation Age of Onset   Depression Mother    Kidney cancer Mother    Dementia Mother        Unspecified, likely secondary to other medical condition (cancer)   Depression Father    Hiatal hernia Father    COPD Sister    Dementia Sister        Unspecified, likely secondary to other medical condition   Nephrolithiasis Other    Colon  cancer Neg Hx    Esophageal cancer Neg Hx    Pancreatic cancer Neg Hx    Rectal cancer Neg Hx    Stomach cancer Neg Hx     Social History   Socioeconomic History   Marital status: Married    Spouse name: Not on file   Number of children: 1   Years of education: 15   Highest education level: Some college, no degree  Occupational History   Occupation: Retired  Tobacco Use   Smoking status: Never   Smokeless tobacco: Never  Vaping Use   Vaping Use: Never used  Substance and Sexual Activity   Alcohol use: No    Alcohol/week: 0.0 standard  drinks of alcohol   Drug use: No   Sexual activity: Not on file  Other Topics Concern   Not on file  Social History Narrative   Unemployed   Married   HH of 2    Has grandchildren out of state   No pets   Sis. died 12-14-11COPD      Right handed   Social Determinants of Health   Financial Resource Strain: Low Risk  (08/25/2021)   Overall Financial Resource Strain (CARDIA)    Difficulty of Paying Living Expenses: Not hard at all  Food Insecurity: No Food Insecurity (01/23/2022)   Hunger Vital Sign    Worried About Running Out of Food in the Last Year: Never true    Ran Out of Food in the Last Year: Never true  Transportation Needs: No Transportation Needs (01/23/2022)   PRAPARE - Administrator, Civil Service (Medical): No    Lack of Transportation (Non-Medical): No  Physical Activity: Inactive (08/25/2021)   Exercise Vital Sign    Days of Exercise per Week: 0 days    Minutes of Exercise per Session: 0 min  Stress: No Stress Concern Present (08/25/2021)   [employer] of Occupational Health - Occupational Stress Questionnaire    Feeling of Stress : Not at all  Social Connections: Socially Integrated (08/25/2021)   Social Connection and Isolation Panel [NHANES]    Frequency of Communication with Friends and Family: More than three times a week    Frequency of Social Gatherings with Friends and Family: More than  three times a week    Attends Religious Services: More than 4 times per year    Active Member of [employer] or Organizations: Yes    Attends Banker Meetings: More than 4 times per year    Marital Status: Married  Catering manager Violence: Not At Risk (01/23/2022)   Humiliation, Afraid, Rape, and Kick questionnaire    Fear of Current or Ex-Partner: No    Emotionally Abused: No    Physically Abused: No    Sexually Abused: No    Review of Systems  HENT:  Positive for drooling.   Eyes: Negative.   Respiratory:  Positive for apnea and shortness of breath.   Cardiovascular: Negative.   Gastrointestinal: Negative.   Endocrine: Negative.   Musculoskeletal:  Positive for arthralgias and myalgias.  Psychiatric/Behavioral:  Positive for sleep disturbance.     Vitals:   05/22/22 1331  BP: 102/62  Pulse: 63  SpO2: 97%     Physical Exam Constitutional:      Appearance: Normal appearance. She is well-developed.  HENT:     Head: Normocephalic and atraumatic.     Mouth/Throat:     Mouth: Mucous membranes are moist.  Eyes:     General:        Right eye: No discharge.        Left eye: No discharge.  Neck:     Thyroid: No thyromegaly.  Cardiovascular:     Rate and Rhythm: Normal rate.  Pulmonary:     Effort: Pulmonary effort is normal. No respiratory distress.     Breath sounds: Normal breath sounds. No stridor. No wheezing or rhonchi.  Musculoskeletal:        General: Normal range of motion.     Cervical back: No rigidity or tenderness.  Neurological:     Mental Status: She is alert.  Psychiatric:        Mood and Affect: Mood normal.  03/15/2018    1:00 PM 02/13/2018   11:00 AM 09/25/2017    3:00 PM  Results of the Epworth flowsheet  Sitting and reading 0 0 0  Watching TV 3 3 1   Sitting, inactive in a public place (e.g. a theatre or a meeting) 0 0 0  As a passenger in a car for an hour without a break 0 0 0  Lying down to rest in the afternoon when  circumstances permit 3 3 2   Sitting and talking to someone 0 0 0  Sitting quietly after a lunch without alcohol 0 0 0  In a car, while stopped for a few minutes in traffic 0 0 0  Total score 6 6 3    Has not been able to tolerate BiPAP Only 12% compliance 14/10 Residual AHI 34.5  Assessment:  Obstructive sleep apnea for which she was titrated to pressure of 18/14, most recently decreased to 14/10 -She is still not tolerating the pressures  Nonrestorative sleep Multiple awakenings  Excessive daytime sleepiness which is likely multifactorial A lot of fatigue  Multiple recent falls  Multiple awakenings due to having to use the restroom  With significant weight loss, I believe she will be best served by repeating a sleep study to ascertain severity of sleep disordered breathing and optimal pressure settings for the same  Plan/Recommendations:  Will schedule a sleep study when patient more stable overall  Encouraged to stay active  Call us with significant concerns  Follow-up in 3 months  Goal of treatment is still to get on a BiPAP pressure/therapy that does not disrupt her sleep or place her at risk of falls at night  Virl Diamond MD [facility] 05/22/2022, 1:46 PM  CC: Panosh, Neta Mends, MD

## 2022-05-22 NOTE — Patient Instructions (Signed)
I will see you back in about 3 months  Graded activities as tolerated  We should consider getting a sleep study when all other active health problems are stabilized  Call us with significant concerns  The goal with treating your sleep disordered breathing is to help you get better quality sleep so that you can function better during the day not to disrupt your sleep

## 2022-05-22 NOTE — Telephone Encounter (Signed)
Ok consider psych referral after medical evaluations  as suggested

## 2022-05-24 ENCOUNTER — Encounter: Payer: Self-pay | Admitting: Gastroenterology

## 2022-05-24 ENCOUNTER — Ambulatory Visit: Payer: Medicare Other | Admitting: Gastroenterology

## 2022-05-24 VITALS — BP 116/70 | HR 80 | Ht <= 58 in | Wt 105.8 lb

## 2022-05-24 DIAGNOSIS — K2100 Gastro-esophageal reflux disease with esophagitis, without bleeding: Secondary | ICD-10-CM

## 2022-05-24 DIAGNOSIS — R296 Repeated falls: Secondary | ICD-10-CM | POA: Diagnosis not present

## 2022-05-24 DIAGNOSIS — K582 Mixed irritable bowel syndrome: Secondary | ICD-10-CM | POA: Diagnosis not present

## 2022-05-24 DIAGNOSIS — R1310 Dysphagia, unspecified: Secondary | ICD-10-CM

## 2022-05-24 MED ORDER — FAMOTIDINE 40 MG PO TABS: 40.0000 mg | ORAL_TABLET | Freq: Every day | ORAL | 0 refills | Status: DC

## 2022-05-24 MED ORDER — PANTOPRAZOLE SODIUM 40 MG PO TBEC: 40.0000 mg | DELAYED_RELEASE_TABLET | Freq: Every day | ORAL | 3 refills | Status: DC

## 2022-05-24 NOTE — Progress Notes (Signed)
Caitlin [name]    [id number]    [birth year]  Primary Care Physician:Panosh, Neta Mends, MD  Referring Physician: Madelin Headings, MD [address] [place],  [place] [address]   Chief complaint: Dysphagia, IBS  HPI:  77 year old very pleasant female here for follow-up visit for worsening GERD symptoms and dysphagia symptoms.  She is accompanied by her husband.   She is experiencing more acid reflux symptoms despite taking daily Nexium.  She has has regurgitation, epigastric discomfort and retrosternal chest fullness and spasms.  She is experiencing more symptoms with dysphagia, is having difficulty swallowing pills and also sometimes food.  She feels pills are mostly getting hung up in the back of her throat   She continues to have irregular bowel habits, intermittent diarrhea    She tried lactose-free diet with no improvement.  She does not think any dietary changes have helped   Dicyclomine helps relieve symptoms when she takes it as needed, takes it at bedtime on most days   She is continuing to have multiple falls, was referred to physical therapy and is awaiting evaluation.  She was hospitalized in January 2024 after fall, had compression fracture of thoracolumbar vertebra, was conservatively managed, was hypoxic.  Per patient's husband she became delirious during that hospital stay and took her many weeks to recover from that.   Colonoscopy 10/04/2016: Diverticulosis. Internal hemorrhoids. recall colonoscopy 10 years   EGD 10/04/2016: 6cm Hiatal hernia, Slipped Nissen   Denies any nausea, vomiting, abdominal pain, melena or bright red blood per rectum     Outpatient Encounter Medications as of 05/24/2022  Medication Sig   acetaminophen (TYLENOL) 325 MG tablet Take 2 tablets (650 mg total) by mouth every 6 (six) hours as needed for mild pain (or Fever >/= 101).   amLODipine (NORVASC) 10 MG tablet Take 1 tablet (10 mg total) by mouth daily. (Patient not taking:  Reported on 05/22/2022)   buPROPion (WELLBUTRIN XL) 150 MG 24 hr tablet Take 1 tablet (150 mg total) by mouth daily.   Cholecalciferol (VITAMIN D3) 1000 units CAPS Take 1,000 Units by mouth daily.   Cyanocobalamin (VITAMIN B-12) 1000 MCG SUBL Place 2,000 mcg under the tongue daily.   dicyclomine (BENTYL) 10 MG capsule Take 1 capsule (10 mg total) by mouth every 8 (eight) hours as needed for spasms.   escitalopram (LEXAPRO) 10 MG tablet TAKE 1 & 1/2 (ONE & ONE-HALF) TABLETS BY MOUTH ONCE DAILY   escitalopram (LEXAPRO) 5 MG tablet Take 3 tablets (15 mg total) by mouth daily.   esomeprazole (NEXIUM) 40 MG capsule Take 1 capsule (40 mg total) by mouth daily.   Multiple Vitamins-Minerals (PRESERVISION AREDS 2) CAPS Take 1 capsule by mouth 2 (two) times a week.   potassium chloride (KLOR-CON) 10 MEQ tablet TAKE 1 TABLET BY MOUTH EVERY OTHER DAY WITH FLUID TABLET   SIMPLY SALINE 0.9 % AERS Place 1 spray into both nostrils in the morning and at bedtime.   traMADol (ULTRAM) 50 MG tablet Take 2 tablets (100 mg total) by mouth at bedtime. (Patient not taking: Reported on 05/22/2022)   No facility-administered encounter medications on file as of 05/24/2022.    Allergies as of 05/24/2022 - Review Complete 05/22/2022  Allergen Reaction Noted   Codeine phosphate Other (See Comments) 03/15/2006   Mobic [meloxicam] Diarrhea 11/28/2011   Protonix [pantoprazole sodium] Diarrhea and Other (See Comments) 06/05/2012   Ibuprofen Diarrhea and Other (See Comments) 01/23/2022  Peppermint oil Other (See Comments) 01/23/2022   Aleve [naproxen] Nausea Only 05/15/2016   Colestipol hcl  05/24/2017   Omeprazole-sodium bicarbonate Diarrhea and Other (See Comments) 12/23/2008   Sodium Other (See Comments) 11/16/2017   Sulfamethoxazole Diarrhea and Nausea And Vomiting 03/15/2006    Past Medical History:  Diagnosis Date   Allergic rhinitis 07/11/2006   Back pain 10/09/2007   Blood transfusion without reported diagnosis     Diaphragmatic hernia 07/11/2006   Diverticulosis of colon    Eczema 07/11/2006   Elevated cholesterol 04/29/2012   Enteritis    Erythema nodosum    Tends to be seasonal has had a negative chest x-ray in the past negative PPD   Essential hypertension 07/11/2006   Fatigue 03/12/2007   Fibromyalgia    Gastroparesis 12/23/2008   Generalized anxiety disorder 09/01/2014   GERD (gastroesophageal reflux disease)    History of stricture and dilatation and Nissen surgery   Hearing loss, left ear 11/27/2008   History of cataract    bilateral; s/p surgery   Hyperglycemia 08/15/2006   Irritable bowel syndrome 12/23/2008   Leg cramps, nocturnal 11/11/2007   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   MCI (mild cognitive impairment) 10/22/2018   Obstructive sleep apnea 04/23/2007   uses CPAP nightly; mask has leak   Osteoarthritis 11/28/2011   Osteopenia    Prediabetes 04/19/2011   Vitamin B12 deficiency 05/13/2010    Past Surgical History:  Procedure Laterality Date   ABDOMINAL HYSTERECTOMY     bone spur     CATARACT EXTRACTION W/ INTRAOCULAR LENS  IMPLANT, BILATERAL     CESAREAN SECTION     x 1   CHOLECYSTECTOMY     COLONOSCOPY     DENTAL SURGERY     ESOPHAGOGASTRODUODENOSCOPY     stricture and dilatation   IR KYPHO EA ADDL LEVEL THORACIC OR LUMBAR  04/28/2022   IR KYPHO THORACIC WITH BONE BIOPSY  04/28/2022   IR RADIOLOGIST EVAL & MGMT  04/20/2022   IR RADIOLOGIST EVAL & MGMT  05/19/2022   left shoulder surgery  06/08, 07/12/17   x 2   MOUTH SURGERY Right 06/03/2019   NISSEN FUNDOPLICATION     ROTATOR CUFF REPAIR Left    rt knee surgery     scope   UPPER GASTROINTESTINAL ENDOSCOPY      Family History  Problem Relation Age of Onset   Depression Mother    Kidney cancer Mother    Dementia Mother        Unspecified, likely secondary to other medical condition (cancer)   Depression Father    Hiatal hernia Father    COPD Sister    Dementia Sister        Unspecified, likely  secondary to other medical condition   Nephrolithiasis Other    Colon cancer Neg Hx    Esophageal cancer Neg Hx    Pancreatic cancer Neg Hx    Rectal cancer Neg Hx    Stomach cancer Neg Hx     Social History   Socioeconomic History   Marital status: Married    Spouse name: Not on file   Number of children: 1   Years of education: 15   Highest education level: Some college, no degree  Occupational History   Occupation: Retired  Tobacco Use   Smoking status: Never   Smokeless tobacco: Never  Vaping Use   Vaping Use: Never used  Substance and Sexual Activity   Alcohol use: No    Alcohol/week: 0.0 standard  drinks of alcohol   Drug use: No   Sexual activity: Not on file  Other Topics Concern   Not on file  Social History Narrative   Unemployed   Married   HH of 2    Has grandchildren out of state   No pets   Sis. died 11/17/2011COPD      Right handed   Social Determinants of Health   Financial Resource Strain: Low Risk  (08/25/2021)   Overall Financial Resource Strain (CARDIA)    Difficulty of Paying Living Expenses: Not hard at all  Food Insecurity: No Food Insecurity (01/23/2022)   Hunger Vital Sign    Worried About Running Out of Food in the Last Year: Never true    Ran Out of Food in the Last Year: Never true  Transportation Needs: No Transportation Needs (01/23/2022)   PRAPARE - Administrator, Civil Service (Medical): No    Lack of Transportation (Non-Medical): No  Physical Activity: Inactive (08/25/2021)   Exercise Vital Sign    Days of Exercise per Week: 0 days    Minutes of Exercise per Session: 0 min  Stress: No Stress Concern Present (08/25/2021)   [employer] of Occupational Health - Occupational Stress Questionnaire    Feeling of Stress : Not at all  Social Connections: Socially Integrated (08/25/2021)   Social Connection and Isolation Panel [NHANES]    Frequency of Communication with Friends and Family: More than three times a week     Frequency of Social Gatherings with Friends and Family: More than three times a week    Attends Religious Services: More than 4 times per year    Active Member of [employer] or Organizations: Yes    Attends Engineer, structural: More than 4 times per year    Marital Status: Married  Catering manager Violence: Not At Risk (01/23/2022)   Humiliation, Afraid, Rape, and Kick questionnaire    Fear of Current or Ex-Partner: No    Emotionally Abused: No    Physically Abused: No    Sexually Abused: No      Review of systems: All other review of systems negative except as mentioned in the HPI.   Physical Exam: There were no vitals filed for this visit. There is no height or weight on file to calculate BMI. Gen:      No acute distress HEENT:  sclera anicteric Abd:      soft, non-tender; no palpable masses, no distension Ext:    No edema Neuro: alert and oriented x 3 Psych: normal mood and affect  Data Reviewed:  Reviewed labs, radiology imaging, old records and pertinent past GI work up   Assessment and Plan/Recommendations:  77 year old very pleasant female with chronic GERD, recurrent hiatal hernia with slipped Nissen and IBS predominant diarrhea   GERD with recurrent hiatal hernia and slipped Nissen:  Persistent GERD symptoms despite Nexium 40 mg daily, switch to pantoprazole 40 mg daily in the morning and add Pepcid 20 mg daily in the evening  Pill and solid dysphagia: Will need to exclude oropharyngeal dysphagia, will obtain modified barium swallow.  Will also request barium esophagram to exclude esophageal stricture Will defer EGD until patient completes workup for frequent falls, she is at risk for potential complications related to anesthesia and procedure   IBS-diarrhea: Use Benefiber 1 tablespoon 3 times daily with meals Use dicyclomine 10 mg up to 3 times daily as needed Use squatty potty to improve evacuation during defecation If  continues to have difficulty  evacuating, pelvic floor dysfunction, will refer to pelvic floor physical therapy for biofeedback Continue IBgard as needed    Return in 2 to 3 months  This visit required 40 minutes of patient care (this includes precharting, chart review, review of results, face-to-face time used for counseling as well as treatment plan and follow-up. The patient was provided an opportunity to ask questions and all were answered. The patient agreed with the plan and demonstrated an understanding of the instructions.  Iona Beard , MD    CC: Panosh, Neta Mends, MD

## 2022-05-24 NOTE — Patient Instructions (Addendum)
We have sent the following medications to your pharmacy for you to pick up at your convenience: Famotidine  You have been scheduled for an appointment with Dr. Lavon Paganini on 09/04/22 at 1:50 PM . Please arrive 10 minutes early for your appointment.   You will be contacted by [facility] Scheduling in the next 2 days to arrange a barium Swallow and modified barium swallow.  The number on your caller ID will be [phone], please answer when they call.  If you have not heard from them in 2 days please call [phone] to schedule.    _____________________________________________________________________ A Modified Barium Swallow Study, or MBS, is a special x-ray that is taken to check swallowing skills. It is carried out by a Marine scientist and a Warehouse manager (SLP). During this test, yourmouth, throat, and esophagus, a muscular tube which connects your mouth to your stomach, is checked. The test will help you, your doctor, and the SLP plan what types of foods and liquids are easier for you to swallow. The SLP will also identify positions and ways to help you swallow more easily and safely. What will happen during an MBS? You will be taken to an x-ray room and seated comfortably. You will be asked to swallow small amounts of food and liquid mixed with barium. Barium is a liquid or paste that allows images of your mouth, throat and esophagus to be seen on x-ray. The x-ray captures moving images of the food you are swallowing as it travels from your mouth through your throat and into your esophagus. This test helps identify whether food or liquid is entering your lungs (aspiration). The test also shows which part of your mouth or throat lacks strength or coordination to move the food or liquid in the right direction. This test typically takes 30 minutes to 1 hour to complete. _______________________________________________________  Due to recent changes in healthcare laws, you may see  the results of your imaging and laboratory studies on MyChart before your provider has had a chance to review them.  We understand that in some cases there may be results that are confusing or concerning to you. Not all laboratory results come back in the same time frame and the provider may be waiting for multiple results in order to interpret others.  Please give [place] 48 hours in order for your provider to thoroughly review all the results before contacting the office for clarification of your results.   Thank you for choosing me and [facility].  Marsa Aris, MD.

## 2022-05-25 ENCOUNTER — Other Ambulatory Visit (HOSPITAL_COMMUNITY): Payer: Self-pay

## 2022-05-25 DIAGNOSIS — R1310 Dysphagia, unspecified: Secondary | ICD-10-CM

## 2022-06-13 ENCOUNTER — Ambulatory Visit: Payer: Medicare Other | Admitting: Internal Medicine

## 2022-06-13 ENCOUNTER — Encounter: Payer: Self-pay | Admitting: Internal Medicine

## 2022-06-13 VITALS — BP 120/88 | HR 89 | Temp 98.0°F | Ht <= 58 in | Wt 102.0 lb

## 2022-06-13 DIAGNOSIS — I10 Essential (primary) hypertension: Secondary | ICD-10-CM

## 2022-06-13 DIAGNOSIS — G3184 Mild cognitive impairment, so stated: Secondary | ICD-10-CM

## 2022-06-13 DIAGNOSIS — Z79899 Other long term (current) drug therapy: Secondary | ICD-10-CM | POA: Diagnosis not present

## 2022-06-13 DIAGNOSIS — R1310 Dysphagia, unspecified: Secondary | ICD-10-CM

## 2022-06-13 DIAGNOSIS — E876 Hypokalemia: Secondary | ICD-10-CM

## 2022-06-13 DIAGNOSIS — R54 Age-related physical debility: Secondary | ICD-10-CM

## 2022-06-13 NOTE — Progress Notes (Signed)
Chief Complaint  Patient presents with   Follow-up    Husband reports pt is getting swallow test tomorrow. Want to discuss medication with provider on potassium and lexapro.     HPI: Caitlin [name] 77 y.o. come in for Chronic disease management  here with husband  Osa on cpap bipap unable to tolerate and to get updated sleep study  Dysphagia  gerd hh to have swallowing test tomorrow  Goin g off  diuretic helps  coiding as night  but taking potassium is  a major problem prob only getting 1./2 dosing a few days per week  difficult  Glasses.  No vision change some difficulty   Diet is mostly Soft foods   some ice cream.  More confusion recently Needs to work on upper body strength  uses roller walker . Back pain is more ms now and not related to fx  ortho asks if we can do referral since here anyway.  Has seen neuro in past and had better mental clarity before anesthesia . Clear days and foggy days.  ROS: See pertinent positives and negatives per HPI.  Past Medical History:  Diagnosis Date   Allergic rhinitis 07/11/2006   Back pain 10/09/2007   Blood transfusion without reported diagnosis    Diaphragmatic hernia 07/11/2006   Diverticulosis of colon    Eczema 07/11/2006   Elevated cholesterol 04/29/2012   Enteritis    Erythema nodosum    Tends to be seasonal has had a negative chest x-ray in the past negative PPD   Essential hypertension 07/11/2006   Fatigue 03/12/2007   Fibromyalgia    Gastroparesis 12/23/2008   Generalized anxiety disorder 09/01/2014   GERD (gastroesophageal reflux disease)    History of stricture and dilatation and Nissen surgery   Hearing loss, left ear 11/27/2008   History of cataract    bilateral; s/p surgery   Hyperglycemia 08/15/2006   Irritable bowel syndrome 12/23/2008   Leg cramps, nocturnal 11/11/2007   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   MCI (mild cognitive impairment) 10/22/2018   Obstructive sleep apnea 04/23/2007   uses CPAP nightly;  mask has leak   Osteoarthritis 11/28/2011   Osteopenia    Prediabetes 04/19/2011   Vitamin B12 deficiency 05/13/2010    Family History  Problem Relation Age of Onset   Depression Mother    Kidney cancer Mother    Dementia Mother        Unspecified, likely secondary to other medical condition (cancer)   Depression Father    Hiatal hernia Father    COPD Sister    Dementia Sister        Unspecified, likely secondary to other medical condition   Nephrolithiasis Other    Colon cancer Neg Hx    Esophageal cancer Neg Hx    Pancreatic cancer Neg Hx    Rectal cancer Neg Hx    Stomach cancer Neg Hx     Social History   Socioeconomic History   Marital status: Married    Spouse name: Not on file   Number of children: 1   Years of education: 15   Highest education level: Some college, no degree  Occupational History   Occupation: Retired  Tobacco Use   Smoking status: Never   Smokeless tobacco: Never  Vaping Use   Vaping Use: Never used  Substance and Sexual Activity   Alcohol use: No    Alcohol/week: 0.0 standard drinks of alcohol   Drug use: No   Sexual activity: Not  on file  Other Topics Concern   Not on file  Social History Narrative   Unemployed   Married   HH of 2    Has grandchildren out of state   No pets   Sis. died 11-18-2011COPD      Right handed   Social Determinants of Health   Financial Resource Strain: Low Risk  (08/25/2021)   Overall Financial Resource Strain (CARDIA)    Difficulty of Paying Living Expenses: Not hard at all  Food Insecurity: No Food Insecurity (01/23/2022)   Hunger Vital Sign    Worried About Running Out of Food in the Last Year: Never true    Ran Out of Food in the Last Year: Never true  Transportation Needs: No Transportation Needs (01/23/2022)   PRAPARE - Administrator, Civil Service (Medical): No    Lack of Transportation (Non-Medical): No  Physical Activity: Inactive (08/25/2021)   Exercise Vital Sign    Days  of Exercise per Week: 0 days    Minutes of Exercise per Session: 0 min  Stress: No Stress Concern Present (08/25/2021)   [employer] of Occupational Health - Occupational Stress Questionnaire    Feeling of Stress : Not at all  Social Connections: Socially Integrated (08/25/2021)   Social Connection and Isolation Panel [NHANES]    Frequency of Communication with Friends and Family: More than three times a week    Frequency of Social Gatherings with Friends and Family: More than three times a week    Attends Religious Services: More than 4 times per year    Active Member of [employer] or Organizations: Yes    Attends Engineer, structural: More than 4 times per year    Marital Status: Married    Outpatient Medications Prior to Visit  Medication Sig Dispense Refill   buPROPion (WELLBUTRIN XL) 150 MG 24 hr tablet Take 1 tablet (150 mg total) by mouth daily. 90 tablet 0   Cholecalciferol (VITAMIN D3) 1000 units CAPS Take 1,000 Units by mouth daily.     Cyanocobalamin (VITAMIN B-12) 1000 MCG SUBL Place 2,000 mcg under the tongue daily.     dicyclomine (BENTYL) 10 MG capsule Take 1 capsule (10 mg total) by mouth every 8 (eight) hours as needed for spasms. 270 capsule 2   escitalopram (LEXAPRO) 10 MG tablet TAKE 1 & 1/2 (ONE & ONE-HALF) TABLETS BY MOUTH ONCE DAILY 135 tablet 0   escitalopram (LEXAPRO) 5 MG tablet Take 3 tablets (15 mg total) by mouth daily.     famotidine (PEPCID) 40 MG tablet Take 1 tablet (40 mg total) by mouth daily. 90 tablet 0   Multiple Vitamins-Minerals (PRESERVISION AREDS 2) CAPS Take 1 capsule by mouth 2 (two) times a week.     pantoprazole (PROTONIX) 40 MG tablet Take 1 tablet (40 mg total) by mouth daily. 90 tablet 3   potassium chloride (KLOR-CON) 10 MEQ tablet TAKE 1 TABLET BY MOUTH EVERY OTHER DAY WITH FLUID TABLET 30 tablet 0   SIMPLY SALINE 0.9 % AERS Place 1 spray into both nostrils in the morning and at bedtime.     acetaminophen (TYLENOL) 325 MG tablet  Take 2 tablets (650 mg total) by mouth every 6 (six) hours as needed for mild pain (or Fever >/= 101). (Patient not taking: Reported on 06/13/2022)     No facility-administered medications prior to visit.     EXAM:  BP 120/88 (BP Location: Left Arm, Patient Position: Sitting, Cuff Size: Normal)  Pulse 89   Temp 98 F (36.7 C) (Oral)   Ht 4\' 8"  (1.422 m)   Wt 102 lb (46.3 kg)   SpO2 98%   BMI 22.87 kg/m   Body mass index is 22.87 kg/m.  GENERAL: vitals reviewed and listed above, alert,  appears well hydrated and in no acute distress  using walker roller  conversnat but some what diffuse  able to get up from chiar with cautions and walk and back down  HEENT: atraumatic, conjunctiva  clear, no obvious abnormalities on inspection of external nose and ears  NECK: no obvious masses on inspection palpation  LUNGS: clear to auscultation bilaterally, no wheezes, rales or rhonchi, good air movement no point tenderness back  CV: HRRR, no clubbing cyanosis or  peripheral edema nl cap refill  MS: moves all extremities without noticeable focal  abnormality PSYCH: pleasant and cooperative, Lab Results  Component Value Date   WBC 14.6 (H) 04/19/2022   HGB 14.1 04/19/2022   HCT 42.4 04/19/2022   PLT 351.0 04/19/2022   GLUCOSE 83 05/05/2022   CHOL 164 05/07/2019   TRIG 76.0 05/07/2019   HDL 85.90 05/07/2019   LDLCALC 63 05/07/2019   ALT 21 01/27/2022   AST 17 01/27/2022   NA 136 05/05/2022   K 3.5 05/05/2022   CL 103 05/05/2022   CREATININE 0.79 05/05/2022   BUN 15 05/05/2022   CO2 22 05/05/2022   TSH 1.232 01/25/2022   HGBA1C 5.5 05/24/2021   MICROALBUR 1.6 04/26/2011   BP Readings from Last 3 Encounters:  06/13/22 120/88  05/24/22 116/70  05/22/22 102/62    ASSESSMENT AND PLAN:  Discussed the following assessment and plan:  Frail elderly - needs help with  upper body strength - Plan: Ambulatory referral to Physical Therapy  Medication management - cannot swallow  potassium goal would be to get off  potassium rx and maintain normokalemia  Hypokalemia  Dysphagia, unspecified type  MCI (mild cognitive impairment) - more  impaired recnetly  Essential hypertension - ok today Potassium last was 3.5 ok but not able to take full potassium dose    she is now off of diuretic which is helpful . Will reach out to  [facility] pharmacy about  options for potassium supplementation  ? Maybe salt substitute?? And close fiu?  Glad her frequent urination is better at night. Will order pt for [facility] body strength [facility]  -Patient advised to return or notify health care team  if  new concerns arise.  Patient Instructions  Can  do pt referral? For upper body strength.  I will ask the chronic team about the potassium .   To reach out to  you.  Then plan fu in 2-3 months     Destry Bezdek K. Makenzie Vittorio M.D.

## 2022-06-13 NOTE — Patient Instructions (Addendum)
Can  do pt referral? For upper body strength.  I will ask the chronic team about the potassium .   To reach out to  you.  Then plan fu in 2-3 months

## 2022-06-14 ENCOUNTER — Ambulatory Visit (HOSPITAL_COMMUNITY)
Admission: RE | Admit: 2022-06-14 | Discharge: 2022-06-14 | Disposition: A | Discharge status: Home / Self Care | Payer: Medicare Other | Source: Ambulatory Visit | Attending: Internal Medicine | Admitting: Internal Medicine

## 2022-06-14 ENCOUNTER — Ambulatory Visit (HOSPITAL_COMMUNITY)
Admission: RE | Admit: 2022-06-14 | Discharge: 2022-06-14 | Disposition: A | Discharge status: Home / Self Care | Payer: Medicare Other | Source: Ambulatory Visit | Attending: Gastroenterology | Admitting: Gastroenterology

## 2022-06-14 DIAGNOSIS — K224 Dyskinesia of esophagus: Secondary | ICD-10-CM | POA: Insufficient documentation

## 2022-06-14 DIAGNOSIS — R1310 Dysphagia, unspecified: Secondary | ICD-10-CM | POA: Diagnosis not present

## 2022-06-14 DIAGNOSIS — K2100 Gastro-esophageal reflux disease with esophagitis, without bleeding: Secondary | ICD-10-CM | POA: Diagnosis not present

## 2022-06-14 DIAGNOSIS — K449 Diaphragmatic hernia without obstruction or gangrene: Secondary | ICD-10-CM | POA: Diagnosis not present

## 2022-06-27 ENCOUNTER — Other Ambulatory Visit: Payer: Self-pay | Admitting: Family

## 2022-07-06 ENCOUNTER — Ambulatory Visit: Payer: Medicare Other | Attending: Internal Medicine | Admitting: Physical Therapy

## 2022-07-06 ENCOUNTER — Other Ambulatory Visit: Payer: Self-pay

## 2022-07-06 DIAGNOSIS — R262 Difficulty in walking, not elsewhere classified: Secondary | ICD-10-CM | POA: Insufficient documentation

## 2022-07-06 DIAGNOSIS — R54 Age-related physical debility: Secondary | ICD-10-CM | POA: Diagnosis not present

## 2022-07-06 DIAGNOSIS — R296 Repeated falls: Secondary | ICD-10-CM | POA: Insufficient documentation

## 2022-07-06 DIAGNOSIS — M6281 Muscle weakness (generalized): Secondary | ICD-10-CM | POA: Insufficient documentation

## 2022-07-06 NOTE — Therapy (Signed)
OUTPATIENT PHYSICAL THERAPY NEURO EVALUATION   Patient Name: Caitlin [name] MRN: [id number] DOB:[birth year], 77 y.o., female Today's Date: 07/06/2022    REFERRING PROVIDER: Berniece Andreas MD  END OF SESSION:  PT End of Session - 07/06/22 1242     Visit Number 1    Date for PT Re-Evaluation 08/31/22    Authorization Type [employer] Medicare    PT Start Time 1234    PT Stop Time 1315    PT Time Calculation (min) 41 min    Activity Tolerance Patient tolerated treatment well             Past Medical History:  Diagnosis Date   Allergic rhinitis 07/11/2006   Back pain 10/09/2007   Blood transfusion without reported diagnosis    Diaphragmatic hernia 07/11/2006   Diverticulosis of colon    Eczema 07/11/2006   Elevated cholesterol 04/29/2012   Enteritis    Erythema nodosum    Tends to be seasonal has had a negative chest x-ray in the past negative PPD   Essential hypertension 07/11/2006   Fatigue 03/12/2007   Fibromyalgia    Gastroparesis 12/23/2008   Generalized anxiety disorder 09/01/2014   GERD (gastroesophageal reflux disease)    History of stricture and dilatation and Nissen surgery   Hearing loss, left ear 11/27/2008   History of cataract    bilateral; s/p surgery   Hyperglycemia 08/15/2006   Irritable bowel syndrome 12/23/2008   Leg cramps, nocturnal 11/11/2007   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   MCI (mild cognitive impairment) 10/22/2018   Obstructive sleep apnea 04/23/2007   uses CPAP nightly; mask has leak   Osteoarthritis 11/28/2011   Osteopenia    Prediabetes 04/19/2011   Vitamin B12 deficiency 05/13/2010   Past Surgical History:  Procedure Laterality Date   ABDOMINAL HYSTERECTOMY     bone spur     CATARACT EXTRACTION W/ INTRAOCULAR LENS  IMPLANT, BILATERAL     CESAREAN SECTION     x 1   CHOLECYSTECTOMY     COLONOSCOPY     DENTAL SURGERY     ESOPHAGOGASTRODUODENOSCOPY     stricture and dilatation   IR KYPHO EA ADDL LEVEL THORACIC OR LUMBAR   04/28/2022   IR KYPHO THORACIC WITH BONE BIOPSY  04/28/2022   IR RADIOLOGIST EVAL & MGMT  04/20/2022   IR RADIOLOGIST EVAL & MGMT  05/19/2022   left shoulder surgery  06/08, 07/12/17   x 2   MOUTH SURGERY Right 06/03/2019   NISSEN FUNDOPLICATION     ROTATOR CUFF REPAIR Left    rt knee surgery     scope   UPPER GASTROINTESTINAL ENDOSCOPY     Patient Active Problem List   Diagnosis Date Noted   Acute pain of right shoulder due to trauma 01/27/2022   Compression fracture of thoracolumbar vertebra, closed, initial encounter (HCC) 01/25/2022   Hypoxia 01/23/2022   Hypokalemia 01/23/2022   Fall with injury 01/23/2022   MCI (mild cognitive impairment) 10/22/2018   Generalized anxiety disorder 09/01/2014   Hair loss 08/12/2013   Erythema nodosum 06/05/2012   Elevated cholesterol 04/29/2012   Osteoarthritis 11/28/2011   GERD (gastroesophageal reflux disease)    Prediabetes 04/19/2011   Hypotension 06/07/2010   Vitamin B12 deficiency 05/13/2010   Gastroparesis 12/23/2008   Irritable bowel syndrome 12/23/2008   Hearing loss, left ear 11/27/2008   Leg cramps, nocturnal 11/11/2007   Back pain 10/09/2007   Obstructive sleep apnea 04/23/2007   Fatigue 03/12/2007   Diverticulosis of colon 12/10/2006  Fibromyalgia 12/10/2006   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   Hyperglycemia 08/15/2006   Essential hypertension 07/11/2006   Allergic rhinitis 07/11/2006   Diaphragmatic hernia 07/11/2006   Eczema 07/11/2006    ONSET DATE: January 2024  REFERRING DIAG: Berniece Andreas MD  THERAPY DIAG:  Muscle weakness (generalized)  Rationale for Evaluation and Treatment: Rehabilitation  SUBJECTIVE:                                                                                                                                                                                             SUBJECTIVE STATEMENT: Patient presents with a 4 wheeled walker accompanied and assisted by her husband; he states  Caitlin [name] fell in January sustaining 2 vertebral compression fractures.  She had HHPT and did well with rehab but it was determined after 12 weeks her fractures were not healing.  She underwent kyphoplasty in April.  Since that time he reports "she forgot everything she learned", memory issues and decreased mobility.  Couldn't get up from chair/commode;  3pm gets weaker;  remind to use her legs;  HHPT came January and March; Fell twice last year; shuffles feet; uses 4 wheeled home Pt accompanied by: significant other always had problems with balance  2 Compression fractures in January, kyphoplasty We eat out a lot, historically doesn't cook Stands for shower, husband assists in/out Makes bed, does dishes I don't think I could walk 1/2 football field Uses handicap parking spot Weight loss from 133# to 96#  PERTINENT HISTORY:  Presumed osteoporosis secondary to multiple fractures including vertebral compression fractures in January and kyphoplasty in April IBS osteoporosis  PAIN:  PAIN:  Are you having pain? Yes NPRS scale: 2/10 Pain location: lower back, mid back later in the day Aggravating factors: later in the day legs give out  Relieving factors: Ok standing  PRECAUTIONS: Fall  WEIGHT BEARING RESTRICTIONS: No  FALLS: Has patient fallen in last 6 months? Yes. Number of falls 1  LIVING ENVIRONMENT: Lives with: lives in senior friendly community handicap accessible Lives in: House/apartment Stairs: No Has following equipment at home: Walker - 4 wheeled  PLOF: prior to kyphoplasty surgery, patient's husband states she was able to clean their home  PATIENT GOALS: walking, loves to clean house, be able to pick up trash from floor; be able to do laundry  OBJECTIVE:    COGNITION: Decreased ability to follow multi step directions;  needs demonstration, repetition and simple verbal cues   Short Physical Performance Battery:    Balance:       Side by side stance:  1  points  (1)  Semi -tandem:  0  points (1)  Tandem:   0   points (2)    Gait Speed: (13.1 feet) done 2x take the best time: 1     points  13.40 sec                                           > 8.70 sec=1 pt                                           6.21- 8.70 sec=2 pt                                           4.82-6.20 sec=3 pt                                            < 4.82 = 4 pts    Repeated Chair Stands:   0   points  patient requires UE use to rise   (timer stopped when straight on 5th stand)                                                If used arms=0 pts                                              > 60 sec=0 pts                                              16.70 or more=1 pt                                               13.70-16.69=2 pts                                               11.20-13.69=3 pts                                               11.9 sec or less=4 pts Total score= 2     points <10/12 predictive of 1 or more mobility limitations and increased risk of mobility disability 6 or less/12 associated with a higher fall rate  UPPER AND LOWER EXTREMITY ROM:   WFLS   LOWER EXTREMITY MMT:  Unable to rise from the chair without UE support  MMT Right Eval Left Eval  Hip flexion 3+ 3+  Hip extension    Hip abduction 3+ 3+  Hip adduction    Hip internal rotation    Hip external rotation    Knee flexion 4 4  Knee extension 3+ 3+  Ankle dorsiflexion    Ankle plantarflexion 3+ 3+  Ankle inversion    Ankle eversion    (Blank rows = not tested)   GAIT: Assistive device utilized: Environmental consultant - 4 wheeled Level of assistance: SBA Comments: patient tends to rise from sit to stand pulling from the walker when in unlocked position  FUNCTIONAL TESTS:  TUG 1:09  several attempts to rise with UE assist 5x sit to stand with heavy UE use: 29.12 sec 2 min walk test with RW: 123 feet  BERG 24/56 = high risk of falls 100%  TODAY'S  TREATMENT:                                                                                                                              DATE: 6/20    PATIENT EDUCATION: Education details: Educated patient on anatomy and physiology of current symptoms, prognosis, plan of care as well as initial self care strategies to promote recovery Person educated: Patient Education method: Explanation Education comprehension: verbalized understanding  HOME EXERCISE PROGRAM: To be started  GOALS: Goals reviewed with patient? Yes  SHORT TERM GOALS: Target date: 08/03/2022   The patient and husband will demonstrate knowledge of basic  exercises to promote mobility and strength for improved function Baseline: Goal status: INITIAL  2.  The patient will be able to walk for 4 minutes or 400 feet needed for short distance community ambulation Baseline:  Goal status: INITIAL  3.  The patient will have improved Timed Up and Go (TUG) time to    50 sec   sec indicating improved gait speed and LE strength  Baseline:  Goal status: INITIAL  4.  Able to rise sit to stand from standard height chair 1x without UE use Baseline:  Goal status: INITIAL     LONG TERM GOALS: Target date: 08/31/2022  The patient and husband will be independent in a safe self progression of a home exercise program to promote further recovery of function  Baseline:  Goal status: INITIAL  2.  The patient will have improved gait stamina and speed needed to ambulate 600 feet in 6 minutes  Baseline:  Goal status: INITIAL  3.  SPPB test improved to at least 7/12 indicating lower fall risk and decreased frailty Baseline:  Goal status: INITIAL  4.  Improved LE strength needed for standing to perform light housework (wiping counters, folding laundry) Baseline:  Goal status: INITIAL  5.  The patient will have an improved BERG balance score to   33 /56 indicating reduced  risk of falls  Baseline:  Goal status: INITIAL  6.  The patient will have improved gait stamina and speed needed to ambulate 600 feet in 6 minutes with RW INITIAL    ASSESSMENT:  CLINICAL IMPRESSION: Patient is a 77 y.o. female who was seen today for physical therapy evaluation and treatment for generalized weakness and frailty following a fall in January resulting in compression fractures and kyphoplasty in March.  Since that time the patient's husband reports memory issues, decreased mobility and function.  She is a high risk for falls per Short Physical Performance Battery and BERG balance test.  She has difficulty following multi step commands but able to follow 1 step directions with demonstration and repetition of directions.  UE assist required with sit to stand.  She is using a 4 wheeled RW full time although prior to the kyphoplasty the patient's husband reports she was starting to walk some without it in the house.  Decreased gait speed noted with TUG and 2 min walk tests.  She would benefit from PT for strengthening, functional mobility, balance exercise and gait training.    OBJECTIVE IMPAIRMENTS: decreased activity tolerance, decreased balance, decreased mobility, difficulty walking, decreased strength, decreased safety awareness, impaired perceived functional ability, and pain.   ACTIVITY LIMITATIONS: carrying, lifting, bending, squatting, stairs, bathing, toileting, dressing, hygiene/grooming, and locomotion level  PARTICIPATION LIMITATIONS: meal prep, cleaning, laundry, shopping, and community activity  PERSONAL FACTORS: Time since onset of injury/illness/exacerbation and 3+ comorbidities: osteoporosis with previous fractures, falls, decreased cognition  are also affecting patient's functional outcome.   REHAB POTENTIAL: Good  CLINICAL DECISION MAKING: moderate EVALUATION COMPLEXITY: moderate  PLAN:  PT FREQUENCY: 2x/week  PT DURATION: 8 weeks  PLANNED INTERVENTIONS: Therapeutic exercises, Therapeutic activity,  Neuromuscular re-education, Balance training, Gait training, Patient/Family education, Self Care, Aquatic Therapy, Manual therapy, and Re-evaluation  PLAN FOR NEXT SESSION: nu-step, initiate simple seated ex's for HEP;  sit to stand, standing exercise; gait with RW  Lavinia Sharps, PT 07/06/22 10:05 PM Phone: [phone] Fax: [phone]

## 2022-07-06 NOTE — Telephone Encounter (Signed)
Pt husband is cutting the potassium pills into 3 pieces and pt is unable to swallow

## 2022-07-17 ENCOUNTER — Ambulatory Visit: Payer: Medicare Other | Attending: Internal Medicine

## 2022-07-17 DIAGNOSIS — R296 Repeated falls: Secondary | ICD-10-CM | POA: Insufficient documentation

## 2022-07-17 DIAGNOSIS — M6281 Muscle weakness (generalized): Secondary | ICD-10-CM | POA: Insufficient documentation

## 2022-07-17 DIAGNOSIS — R293 Abnormal posture: Secondary | ICD-10-CM | POA: Diagnosis present

## 2022-07-17 DIAGNOSIS — M5442 Lumbago with sciatica, left side: Secondary | ICD-10-CM | POA: Insufficient documentation

## 2022-07-17 DIAGNOSIS — R252 Cramp and spasm: Secondary | ICD-10-CM | POA: Diagnosis present

## 2022-07-17 DIAGNOSIS — G8929 Other chronic pain: Secondary | ICD-10-CM | POA: Insufficient documentation

## 2022-07-17 DIAGNOSIS — R262 Difficulty in walking, not elsewhere classified: Secondary | ICD-10-CM | POA: Insufficient documentation

## 2022-07-17 NOTE — Therapy (Signed)
OUTPATIENT PHYSICAL THERAPY NEURO TREATMENT   Patient Name: Caitlin [name] MRN: [id number] DOB:[birth year], 77 y.o., female Today's Date: 07/17/2022    REFERRING PROVIDER: Berniece Andreas MD  END OF SESSION:  PT End of Session - 07/17/22 1239     Visit Number 2    Date for PT Re-Evaluation 08/31/22    Authorization Type [employer] Medicare    PT Start Time 1234    PT Stop Time 1312    PT Time Calculation (min) 38 min    Activity Tolerance Patient tolerated treatment well    Behavior During Therapy Flat affect             Past Medical History:  Diagnosis Date   Allergic rhinitis 07/11/2006   Back pain 10/09/2007   Blood transfusion without reported diagnosis    Diaphragmatic hernia 07/11/2006   Diverticulosis of colon    Eczema 07/11/2006   Elevated cholesterol 04/29/2012   Enteritis    Erythema nodosum    Tends to be seasonal has had a negative chest x-ray in the past negative PPD   Essential hypertension 07/11/2006   Fatigue 03/12/2007   Fibromyalgia    Gastroparesis 12/23/2008   Generalized anxiety disorder 09/01/2014   GERD (gastroesophageal reflux disease)    History of stricture and dilatation and Nissen surgery   Hearing loss, left ear 11/27/2008   History of cataract    bilateral; s/p surgery   Hyperglycemia 08/15/2006   Irritable bowel syndrome 12/23/2008   Leg cramps, nocturnal 11/11/2007   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   MCI (mild cognitive impairment) 10/22/2018   Obstructive sleep apnea 04/23/2007   uses CPAP nightly; mask has leak   Osteoarthritis 11/28/2011   Osteopenia    Prediabetes 04/19/2011   Vitamin B12 deficiency 05/13/2010   Past Surgical History:  Procedure Laterality Date   ABDOMINAL HYSTERECTOMY     bone spur     CATARACT EXTRACTION W/ INTRAOCULAR LENS  IMPLANT, BILATERAL     CESAREAN SECTION     x 1   CHOLECYSTECTOMY     COLONOSCOPY     DENTAL SURGERY     ESOPHAGOGASTRODUODENOSCOPY     stricture and dilatation   IR KYPHO  EA ADDL LEVEL THORACIC OR LUMBAR  04/28/2022   IR KYPHO THORACIC WITH BONE BIOPSY  04/28/2022   IR RADIOLOGIST EVAL & MGMT  04/20/2022   IR RADIOLOGIST EVAL & MGMT  05/19/2022   left shoulder surgery  06/08, 07/12/17   x 2   MOUTH SURGERY Right 06/03/2019   NISSEN FUNDOPLICATION     ROTATOR CUFF REPAIR Left    rt knee surgery     scope   UPPER GASTROINTESTINAL ENDOSCOPY     Patient Active Problem List   Diagnosis Date Noted   Acute pain of right shoulder due to trauma 01/27/2022   Compression fracture of thoracolumbar vertebra, closed, initial encounter (HCC) 01/25/2022   Hypoxia 01/23/2022   Hypokalemia 01/23/2022   Fall with injury 01/23/2022   MCI (mild cognitive impairment) 10/22/2018   Generalized anxiety disorder 09/01/2014   Hair loss 08/12/2013   Erythema nodosum 06/05/2012   Elevated cholesterol 04/29/2012   Osteoarthritis 11/28/2011   GERD (gastroesophageal reflux disease)    Prediabetes 04/19/2011   Hypotension 06/07/2010   Vitamin B12 deficiency 05/13/2010   Gastroparesis 12/23/2008   Irritable bowel syndrome 12/23/2008   Hearing loss, left ear 11/27/2008   Leg cramps, nocturnal 11/11/2007   Back pain 10/09/2007   Obstructive sleep apnea 04/23/2007   Fatigue 03/12/2007  Diverticulosis of colon 12/10/2006   Fibromyalgia 12/10/2006   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   Hyperglycemia 08/15/2006   Essential hypertension 07/11/2006   Allergic rhinitis 07/11/2006   Diaphragmatic hernia 07/11/2006   Eczema 07/11/2006    ONSET DATE: January 2024  REFERRING DIAG: Berniece Andreas MD  THERAPY DIAG:  Muscle weakness (generalized)  Difficulty in walking, not elsewhere classified  Repeated falls  Abnormal posture  Cramp and spasm  Rationale for Evaluation and Treatment: Rehabilitation  SUBJECTIVE:                                                                                                                                                                                              SUBJECTIVE STATEMENT: Patient arrives with spouse.  She is fairly quiet and spouse talks for her.  Spouse states she "isn't picking her feet up today".  Spouse decides to go out and walk while patient in treatment so that she will focus on the PT and not keep looking at him.    PERTINENT HISTORY:  Presumed osteoporosis secondary to multiple fractures including vertebral compression fractures in January and kyphoplasty in April IBS osteoporosis  PAIN:  PAIN:  Are you having pain? Yes NPRS scale: 2/10 Pain location: lower back, mid back later in the day Aggravating factors: later in the day legs give out  Relieving factors: Ok standing  PRECAUTIONS: Fall  WEIGHT BEARING RESTRICTIONS: No  FALLS: Has patient fallen in last 6 months? Yes. Number of falls 1  LIVING ENVIRONMENT: Lives with: lives in senior friendly community handicap accessible Lives in: House/apartment Stairs: No Has following equipment at home: Walker - 4 wheeled  PLOF: prior to kyphoplasty surgery, patient's husband states she was able to clean their home  PATIENT GOALS: walking, loves to clean house, be able to pick up trash from floor; be able to do laundry  OBJECTIVE:    COGNITION: Decreased ability to follow multi step directions;  needs demonstration, repetition and simple verbal cues   Short Physical Performance Battery:    Balance:       Side by side stance:  1  points (1)  Semi -tandem:   0  points (1)  Tandem:   0   points (2)    Gait Speed: (13.1 feet) done 2x take the best time: 1     points  13.40 sec                                           > 8.70  sec=1 pt                                           6.21- 8.70 sec=2 pt                                           4.82-6.20 sec=3 pt                                            < 4.82 = 4 pts    Repeated Chair Stands:   0   points  patient requires UE use to rise   (timer stopped when straight on 5th stand)                                                 If used arms=0 pts                                              > 60 sec=0 pts                                              16.70 or more=1 pt                                               13.70-16.69=2 pts                                               11.20-13.69=3 pts                                               11.9 sec or less=4 pts Total score= 2     points <10/12 predictive of 1 or more mobility limitations and increased risk of mobility disability 6 or less/12 associated with a higher fall rate                                                   UPPER AND LOWER EXTREMITY ROM:   WFLS   LOWER EXTREMITY MMT:  Unable to rise from the chair without UE support  MMT Right Eval Left Eval  Hip flexion 3+ 3+  Hip extension    Hip abduction 3+ 3+  Hip adduction    Hip internal rotation  Hip external rotation    Knee flexion 4 4  Knee extension 3+ 3+  Ankle dorsiflexion    Ankle plantarflexion 3+ 3+  Ankle inversion    Ankle eversion    (Blank rows = not tested)   GAIT: Assistive device utilized: Environmental consultant - 4 wheeled Level of assistance: SBA Comments: patient tends to rise from sit to stand pulling from the walker when in unlocked position  FUNCTIONAL TESTS:  TUG 1:09  several attempts to rise with UE assist 5x sit to stand with heavy UE use: 29.12 sec 2 min walk test with RW: 123 feet  BERG 24/56 = high risk of falls 100%  TODAY'S TREATMENT:                                                                                                                              DATE: 07/17/22 Nustep x 5 min, level 2 Seated toe and heel raises x 20 Seated march x 20 Seated LAQ x 10 each LE Seated clam with green loop 2 x 10 Instructed in safe sit to supine Supine hook lying clam with green loop 2 x 10 Hooklying with ball under knee SAQ 2 x 10 Supine hip abduction with towel under heel 2 x 10 Hook lying bridge 2 x 10 (could not lift buttocks off table-  assisted with several reps on second set and then she was able to do 2-3 with a little buttock clearance) Lower trunk rotation x 20 Instructed in safe supine to sit  DATE: 6/20    PATIENT EDUCATION: Education details: Educated patient on anatomy and physiology of current symptoms, prognosis, plan of care as well as initial self care strategies to promote recovery Person educated: Patient Education method: Explanation Education comprehension: verbalized understanding  HOME EXERCISE PROGRAM: Access Code: [email or web address] URL: [email or web address] Date: 07/17/2022 Prepared by: Mikey Kirschner  Exercises - Supine Hip Abduction  - 1 x daily - 7 x weekly - 2 sets - 10 reps - Supine Bridge  - 1 x daily - 7 x weekly - 2 sets - 10 reps - Hooklying Clamshell with Resistance  - 1 x daily - 7 x weekly - 2 sets - 10 reps - Seated Hip Abduction with Resistance  - 1 x daily - 7 x weekly - 2 sets - 10 reps - Seated Long Arc Quad  - 1 x daily - 7 x weekly - 2 sets - 10 reps  GOALS: Goals reviewed with patient? Yes  SHORT TERM GOALS: Target date: 08/03/2022   The patient and husband will demonstrate knowledge of basic  exercises to promote mobility and strength for improved function Baseline: Goal status: INITIAL  2.  The patient will be able to walk for 4 minutes or 400 feet needed for short distance community ambulation Baseline:  Goal status: INITIAL  3.  The patient will have improved Timed Up and Go (TUG) time to    50 sec   sec  indicating improved gait speed and LE strength  Baseline:  Goal status: INITIAL  4.  Able to rise sit to stand from standard height chair 1x without UE use Baseline:  Goal status: INITIAL     LONG TERM GOALS: Target date: 08/31/2022  The patient and husband will be independent in a safe self progression of a home exercise program to promote further recovery of function  Baseline:  Goal status: INITIAL  2.  The patient will have improved gait  stamina and speed needed to ambulate 600 feet in 6 minutes  Baseline:  Goal status: INITIAL  3.  SPPB test improved to at least 7/12 indicating lower fall risk and decreased frailty Baseline:  Goal status: INITIAL  4.  Improved LE strength needed for standing to perform light housework (wiping counters, folding laundry) Baseline:  Goal status: INITIAL  5.  The patient will have an improved BERG balance score to   33 /56 indicating reduced risk of falls  Baseline:  Goal status: INITIAL  6. The patient will have improved gait stamina and speed needed to ambulate 600 feet in 6 minutes with RW INITIAL    ASSESSMENT:  CLINICAL IMPRESSION: Laury was able to complete all tasks today but struggles with any hip exercises using the glute medius.  She was unable to clear her buttocks from the table on bridges and has some tightness in hip adductors right > left.  She would benefit from continuing skilled PT for strengthening, functional mobility, balance exercise and gait training.    OBJECTIVE IMPAIRMENTS: decreased activity tolerance, decreased balance, decreased mobility, difficulty walking, decreased strength, decreased safety awareness, impaired perceived functional ability, and pain.   ACTIVITY LIMITATIONS: carrying, lifting, bending, squatting, stairs, bathing, toileting, dressing, hygiene/grooming, and locomotion level  PARTICIPATION LIMITATIONS: meal prep, cleaning, laundry, shopping, and community activity  PERSONAL FACTORS: Time since onset of injury/illness/exacerbation and 3+ comorbidities: osteoporosis with previous fractures, falls, decreased cognition  are also affecting patient's functional outcome.   REHAB POTENTIAL: Good  CLINICAL DECISION MAKING: moderate EVALUATION COMPLEXITY: moderate  PLAN:  PT FREQUENCY: 2x/week  PT DURATION: 8 weeks  PLANNED INTERVENTIONS: Therapeutic exercises, Therapeutic activity, Neuromuscular re-education, Balance training, Gait  training, Patient/Family education, Self Care, Aquatic Therapy, Manual therapy, and Re-evaluation  PLAN FOR NEXT SESSION: nu-step, Progress simple seated ex's for HEP;  add sit to stand, progress to standing exercise; gait with RW  Victorino Dike B. Zadie Deemer, PT 07/17/22 1:28 PM [facility] [address], [address] [place], [place] [address] Phone # [phone] Fax [phone]

## 2022-07-24 ENCOUNTER — Encounter: Payer: Self-pay | Admitting: Internal Medicine

## 2022-07-24 ENCOUNTER — Ambulatory Visit: Payer: Medicare Other | Admitting: Physical Therapy

## 2022-07-24 DIAGNOSIS — R296 Repeated falls: Secondary | ICD-10-CM

## 2022-07-24 DIAGNOSIS — M6281 Muscle weakness (generalized): Secondary | ICD-10-CM | POA: Diagnosis not present

## 2022-07-24 DIAGNOSIS — R262 Difficulty in walking, not elsewhere classified: Secondary | ICD-10-CM

## 2022-07-24 NOTE — Therapy (Signed)
OUTPATIENT PHYSICAL THERAPY NEURO TREATMENT   Patient Name: Caitlin [name] MRN: [id number] DOB:August 13, 1945, 77 y.o., female Today's Date: 07/24/2022    REFERRING PROVIDER: Berniece Andreas MD  END OF SESSION:  PT End of Session - 07/24/22 1151     Visit Number 3    Date for PT Re-Evaluation 08/31/22    Authorization Type [facility] Medicare    Activity Tolerance Patient tolerated treatment well    Behavior During Therapy Flat affect             Past Medical History:  Diagnosis Date   Allergic rhinitis 07/11/2006   Back pain 10/09/2007   Blood transfusion without reported diagnosis    Diaphragmatic hernia 07/11/2006   Diverticulosis of colon    Eczema 07/11/2006   Elevated cholesterol 04/29/2012   Enteritis    Erythema nodosum    Tends to be seasonal has had a negative chest x-ray in the past negative PPD   Essential hypertension 07/11/2006   Fatigue 03/12/2007   Fibromyalgia    Gastroparesis 12/23/2008   Generalized anxiety disorder 09/01/2014   GERD (gastroesophageal reflux disease)    History of stricture and dilatation and Nissen surgery   Hearing loss, left ear 11/27/2008   History of cataract    bilateral; s/p surgery   Hyperglycemia 08/15/2006   Irritable bowel syndrome 12/23/2008   Leg cramps, nocturnal 11/11/2007   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   MCI (mild cognitive impairment) 10/22/2018   Obstructive sleep apnea 04/23/2007   uses CPAP nightly; mask has leak   Osteoarthritis 11/28/2011   Osteopenia    Prediabetes 04/19/2011   Vitamin B12 deficiency 05/13/2010   Past Surgical History:  Procedure Laterality Date   ABDOMINAL HYSTERECTOMY     bone spur     CATARACT EXTRACTION W/ INTRAOCULAR LENS  IMPLANT, BILATERAL     CESAREAN SECTION     x 1   CHOLECYSTECTOMY     COLONOSCOPY     DENTAL SURGERY     ESOPHAGOGASTRODUODENOSCOPY     stricture and dilatation   IR KYPHO EA ADDL LEVEL THORACIC OR LUMBAR  04/28/2022   IR KYPHO THORACIC WITH BONE BIOPSY   04/28/2022   IR RADIOLOGIST EVAL & MGMT  04/20/2022   IR RADIOLOGIST EVAL & MGMT  05/19/2022   left shoulder surgery  06/08, 07/12/17   x 2   MOUTH SURGERY Right 06/03/2019   NISSEN FUNDOPLICATION     ROTATOR CUFF REPAIR Left    rt knee surgery     scope   UPPER GASTROINTESTINAL ENDOSCOPY     Patient Active Problem List   Diagnosis Date Noted   Acute pain of right shoulder due to trauma 01/27/2022   Compression fracture of thoracolumbar vertebra, closed, initial encounter (HCC) 01/25/2022   Hypoxia 01/23/2022   Hypokalemia 01/23/2022   Fall with injury 01/23/2022   MCI (mild cognitive impairment) 10/22/2018   Generalized anxiety disorder 09/01/2014   Hair loss 08/12/2013   Erythema nodosum 06/05/2012   Elevated cholesterol 04/29/2012   Osteoarthritis 11/28/2011   GERD (gastroesophageal reflux disease)    Prediabetes 04/19/2011   Hypotension 06/07/2010   Vitamin B12 deficiency 05/13/2010   Gastroparesis 12/23/2008   Irritable bowel syndrome 12/23/2008   Hearing loss, left ear 11/27/2008   Leg cramps, nocturnal 11/11/2007   Back pain 10/09/2007   Obstructive sleep apnea 04/23/2007   Fatigue 03/12/2007   Diverticulosis of colon 12/10/2006   Fibromyalgia 12/10/2006   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   Hyperglycemia 08/15/2006   Essential  hypertension 07/11/2006   Allergic rhinitis 07/11/2006   Diaphragmatic hernia 07/11/2006   Eczema 07/11/2006    ONSET DATE: January 2024  REFERRING DIAG: Berniece Andreas MD  THERAPY DIAG:  Muscle weakness (generalized)  Difficulty in walking, not elsewhere classified  Repeated falls  Rationale for Evaluation and Treatment: Rehabilitation  SUBJECTIVE:                                                                                                                                                                                             SUBJECTIVE STATEMENT: Spouse states he can't get pt to do exercises at home.  PERTINENT  HISTORY:  Presumed osteoporosis secondary to multiple fractures including vertebral compression fractures in January and kyphoplasty in April IBS osteoporosis  PAIN:  PAIN:  Are you having pain? Yes NPRS scale: 2/10 Pain location: lower back, mid back later in the day Aggravating factors: later in the day legs give out  Relieving factors: Ok standing  PRECAUTIONS: Fall  WEIGHT BEARING RESTRICTIONS: No  FALLS: Has patient fallen in last 6 months? Yes. Number of falls 1  LIVING ENVIRONMENT: Lives with: lives in senior friendly community handicap accessible Lives in: House/apartment Stairs: No Has following equipment at home: Walker - 4 wheeled  PLOF: prior to kyphoplasty surgery, patient's husband states she was able to clean their home  PATIENT GOALS: walking, loves to clean house, be able to pick up trash from floor; be able to do laundry  OBJECTIVE:    COGNITION: Decreased ability to follow multi step directions;  needs demonstration, repetition and simple verbal cues   Short Physical Performance Battery:    Balance:       Side by side stance:  1  points (1)  Semi -tandem:   0  points (1)  Tandem:   0   points (2)    Gait Speed: (13.1 feet) done 2x take the best time: 1     points  13.40 sec                                           > 8.70 sec=1 pt                                           6.21- 8.70 sec=2 pt  4.82-6.20 sec=3 pt                                            < 4.82 = 4 pts    Repeated Chair Stands:   0   points  patient requires UE use to rise   (timer stopped when straight on 5th stand)                                                If used arms=0 pts                                              > 60 sec=0 pts                                              16.70 or more=1 pt                                               13.70-16.69=2 pts                                               11.20-13.69=3 pts                                                11.9 sec or less=4 pts Total score= 2     points <10/12 predictive of 1 or more mobility limitations and increased risk of mobility disability 6 or less/12 associated with a higher fall rate                                                   UPPER AND LOWER EXTREMITY ROM:   WFLS   LOWER EXTREMITY MMT:  Unable to rise from the chair without UE support  MMT Right Eval Left Eval  Hip flexion 3+ 3+  Hip extension    Hip abduction 3+ 3+  Hip adduction    Hip internal rotation    Hip external rotation    Knee flexion 4 4  Knee extension 3+ 3+  Ankle dorsiflexion    Ankle plantarflexion 3+ 3+  Ankle inversion    Ankle eversion    (Blank rows = not tested)   GAIT: Assistive device utilized: Environmental consultant - 4 wheeled Level of assistance: SBA Comments: patient tends to rise from sit to stand pulling from the walker when in unlocked position  FUNCTIONAL TESTS:  TUG 1:09  several attempts to rise with UE assist 5x  sit to stand with heavy UE use: 29.12 sec 2 min walk test with RW: 123 feet  BERG 24/56 = high risk of falls 100%  TODAY'S TREATMENT:                                                                                                                              DATE: 07/24/22 Seated anterior/posterior pelvic tilt 2x10 Seated LAQ x10, with 1# ankle weight x10 Seated marching 1# 2x10 Seated hip abd with UE horizontal abduction 1# 2x10 Seated reach up/across with leg rotated back x10 Supine bridge 2x10 Supine SLR 2x10 Supine clamshell red TB 2x10 Supine hip abd 2x10 Standing glute set with hands on cabinet low trap set 2x10 Standing step out + outstretch arm 2x10 Standing scap squeeze x10 Gait training 2 laps around ortho gym focusing on big steps using rollator 1/2 lap around ortho gym marching   DATE: 07/17/22 Nustep x 5 min, level 2 Seated toe and heel raises x 20 Seated march x 20 Seated LAQ x 10 each LE Seated clam with green loop 2 x  10 Instructed in safe sit to supine Supine hook lying clam with green loop 2 x 10 Hooklying with ball under knee SAQ 2 x 10 Supine hip abduction with towel under heel 2 x 10 Hook lying bridge 2 x 10 (could not lift buttocks off table- assisted with several reps on second set and then she was able to do 2-3 with a little buttock clearance) Lower trunk rotation x 20 Instructed in safe supine to sit  DATE: 6/20    PATIENT EDUCATION: Education details: Educated patient on anatomy and physiology of current symptoms, prognosis, plan of care as well as initial self care strategies to promote recovery Person educated: Patient Education method: Explanation Education comprehension: verbalized understanding  HOME EXERCISE PROGRAM: Access Code: [email or web address] URL: [email or web address] Date: 07/17/2022 Prepared by: Mikey Kirschner  Exercises - Supine Hip Abduction  - 1 x daily - 7 x weekly - 2 sets - 10 reps - Supine Bridge  - 1 x daily - 7 x weekly - 2 sets - 10 reps - Hooklying Clamshell with Resistance  - 1 x daily - 7 x weekly - 2 sets - 10 reps - Seated Hip Abduction with Resistance  - 1 x daily - 7 x weekly - 2 sets - 10 reps - Seated Long Arc Quad  - 1 x daily - 7 x weekly - 2 sets - 10 reps  GOALS: Goals reviewed with patient? Yes  SHORT TERM GOALS: Target date: 08/03/2022   The patient and husband will demonstrate knowledge of basic  exercises to promote mobility and strength for improved function Baseline: Goal status: INITIAL  2.  The patient will be able to walk for 4 minutes or 400 feet needed for short distance community ambulation Baseline:  Goal status: INITIAL  3.  The patient will have improved Timed Up and Go (TUG) time to  50 sec   sec indicating improved gait speed and LE strength  Baseline:  Goal status: INITIAL  4.  Able to rise sit to stand from standard height chair 1x without UE use Baseline:  Goal status: INITIAL     LONG TERM GOALS:  Target date: 08/31/2022  The patient and husband will be independent in a safe self progression of a home exercise program to promote further recovery of function  Baseline:  Goal status: INITIAL  2.  The patient will have improved gait stamina and speed needed to ambulate 600 feet in 6 minutes  Baseline:  Goal status: INITIAL  3.  SPPB test improved to at least 7/12 indicating lower fall risk and decreased frailty Baseline:  Goal status: INITIAL  4.  Improved LE strength needed for standing to perform light housework (wiping counters, folding laundry) Baseline:  Goal status: INITIAL  5.  The patient will have an improved BERG balance score to   33 /56 indicating reduced risk of falls  Baseline:  Goal status: INITIAL  6. The patient will have improved gait stamina and speed needed to ambulate 600 feet in 6 minutes with RW INITIAL    ASSESSMENT:  CLINICAL IMPRESSION: Treatment continues to focus on gross bilat LE strengthening. Pt with difficulty complying to exercises at home per spouse. Able to tolerate 1# ankle weight and get lift off during her bridge this session. Initiated more standing exercises. Focused on making bigger steps and movements.   OBJECTIVE IMPAIRMENTS: decreased activity tolerance, decreased balance, decreased mobility, difficulty walking, decreased strength, decreased safety awareness, impaired perceived functional ability, and pain.   ACTIVITY LIMITATIONS: carrying, lifting, bending, squatting, stairs, bathing, toileting, dressing, hygiene/grooming, and locomotion level  PARTICIPATION LIMITATIONS: meal prep, cleaning, laundry, shopping, and community activity  PERSONAL FACTORS: Time since onset of injury/illness/exacerbation and 3+ comorbidities: osteoporosis with previous fractures, falls, decreased cognition  are also affecting patient's functional outcome.   REHAB POTENTIAL: Good  CLINICAL DECISION MAKING: moderate EVALUATION COMPLEXITY:  moderate  PLAN:  PT FREQUENCY: 2x/week  PT DURATION: 8 weeks  PLANNED INTERVENTIONS: Therapeutic exercises, Therapeutic activity, Neuromuscular re-education, Balance training, Gait training, Patient/Family education, Self Care, Aquatic Therapy, Manual therapy, and Re-evaluation  PLAN FOR NEXT SESSION: nu-step, Progress simple seated ex's for HEP;  add sit to stand, progress to standing exercise; gait with RW  [facility] April Dell Ponto, PT 07/24/22 11:51 AM [facility] [address], [address] [place], [place] [address] Phone # [phone] Fax [phone]

## 2022-07-24 NOTE — Progress Notes (Signed)
No chief complaint on file.   HPI: Patient  Caitlin [name]  77 y.o. comes in today for Preventive Health Care visit   Health Maintenance  Topic Date Due   DTaP/Tdap/Td (2 - Td or Tdap) 10/26/2021   COVID-19 Vaccine (6 - 2023-24 season) 01/09/2022   Medicare Annual Wellness (AWV)  08/26/2022   INFLUENZA VACCINE  08/17/2022   MAMMOGRAM  11/19/2022   Pneumonia Vaccine 22+ Years old  Completed   DEXA SCAN  Completed   Hepatitis C Screening  Completed   Zoster Vaccines- Shingrix  Completed   HPV VACCINES  Aged Out   Colonoscopy  Discontinued   Health Maintenance Review LIFESTYLE:  Exercise:   Tobacco/ETS: Alcohol:  Sugar beverages: Sleep: Drug use: no HH of  Work:    ROS:  GEN/ HEENT: No fever, significant weight changes sweats headaches vision problems hearing changes, CV/ PULM; No chest pain shortness of breath cough, syncope,edema  change in exercise tolerance. GI /GU: No adominal pain, vomiting, change in bowel habits. No blood in the stool. No significant GU symptoms. SKIN/HEME: ,no acute skin rashes suspicious lesions or bleeding. No lymphadenopathy, nodules, masses.  NEURO/ PSYCH:  No neurologic signs such as weakness numbness. No depression anxiety. IMM/ Allergy: No unusual infections.  Allergy .   REST of 12 system review negative except as per HPI   Past Medical History:  Diagnosis Date   Allergic rhinitis 07/11/2006   Back pain 10/09/2007   Blood transfusion without reported diagnosis    Diaphragmatic hernia 07/11/2006   Diverticulosis of colon    Eczema 07/11/2006   Elevated cholesterol 04/29/2012   Enteritis    Erythema nodosum    Tends to be seasonal has had a negative chest x-ray in the past negative PPD   Essential hypertension 07/11/2006   Fatigue 03/12/2007   Fibromyalgia    Gastroparesis 12/23/2008   Generalized anxiety disorder 09/01/2014   GERD (gastroesophageal reflux disease)    History of stricture and dilatation and Nissen surgery    Hearing loss, left ear 11/27/2008   History of cataract    bilateral; s/p surgery   Hyperglycemia 08/15/2006   Irritable bowel syndrome 12/23/2008   Leg cramps, nocturnal 11/11/2007   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   MCI (mild cognitive impairment) 10/22/2018   Obstructive sleep apnea 04/23/2007   uses CPAP nightly; mask has leak   Osteoarthritis 11/28/2011   Osteopenia    Prediabetes 04/19/2011   Vitamin B12 deficiency 05/13/2010    Past Surgical History:  Procedure Laterality Date   ABDOMINAL HYSTERECTOMY     bone spur     CATARACT EXTRACTION W/ INTRAOCULAR LENS  IMPLANT, BILATERAL     CESAREAN SECTION     x 1   CHOLECYSTECTOMY     COLONOSCOPY     DENTAL SURGERY     ESOPHAGOGASTRODUODENOSCOPY     stricture and dilatation   IR KYPHO EA ADDL LEVEL THORACIC OR LUMBAR  04/28/2022   IR KYPHO THORACIC WITH BONE BIOPSY  04/28/2022   IR RADIOLOGIST EVAL & MGMT  04/20/2022   IR RADIOLOGIST EVAL & MGMT  05/19/2022   left shoulder surgery  06/08, 07/12/17   x 2   MOUTH SURGERY Right 06/03/2019   NISSEN FUNDOPLICATION     ROTATOR CUFF REPAIR Left    rt knee surgery     scope   UPPER GASTROINTESTINAL ENDOSCOPY      Family History  Problem Relation Age of Onset   Depression Mother  Kidney cancer Mother    Dementia Mother        Unspecified, likely secondary to other medical condition (cancer)   Depression Father    Hiatal hernia Father    COPD Sister    Dementia Sister        Unspecified, likely secondary to other medical condition   Nephrolithiasis Other    Colon cancer Neg Hx    Esophageal cancer Neg Hx    Pancreatic cancer Neg Hx    Rectal cancer Neg Hx    Stomach cancer Neg Hx     Social History   Socioeconomic History   Marital status: Married    Spouse name: Not on file   Number of children: 1   Years of education: 15   Highest education level: Some college, no degree  Occupational History   Occupation: Retired  Tobacco Use   Smoking status: Never    Smokeless tobacco: Never  Building services engineer Use: Never used  Substance and Sexual Activity   Alcohol use: No    Alcohol/week: 0.0 standard drinks of alcohol   Drug use: No   Sexual activity: Not on file  Other Topics Concern   Not on file  Social History Narrative   Unemployed   Married   HH of 2    Has grandchildren out of state   No pets   Sis. died 12-11-11COPD      Right handed   Social Determinants of Health   Financial Resource Strain: Low Risk  (08/25/2021)   Overall Financial Resource Strain (CARDIA)    Difficulty of Paying Living Expenses: Not hard at all  Food Insecurity: No Food Insecurity (01/23/2022)   Hunger Vital Sign    Worried About Running Out of Food in the Last Year: Never true    Ran Out of Food in the Last Year: Never true  Transportation Needs: No Transportation Needs (01/23/2022)   PRAPARE - Administrator, Civil Service (Medical): No    Lack of Transportation (Non-Medical): No  Physical Activity: Inactive (08/25/2021)   Exercise Vital Sign    Days of Exercise per Week: 0 days    Minutes of Exercise per Session: 0 min  Stress: No Stress Concern Present (08/25/2021)   [employer] of Occupational Health - Occupational Stress Questionnaire    Feeling of Stress : Not at all  Social Connections: Socially Integrated (08/25/2021)   Social Connection and Isolation Panel [NHANES]    Frequency of Communication with Friends and Family: More than three times a week    Frequency of Social Gatherings with Friends and Family: More than three times a week    Attends Religious Services: More than 4 times per year    Active Member of [employer] or Organizations: Yes    Attends Engineer, structural: More than 4 times per year    Marital Status: Married    Outpatient Medications Prior to Visit  Medication Sig Dispense Refill   acetaminophen (TYLENOL) 325 MG tablet Take 2 tablets (650 mg total) by mouth every 6 (six) hours as needed for mild  pain (or Fever >/= 101). (Patient not taking: Reported on 06/13/2022)     buPROPion (WELLBUTRIN XL) 150 MG 24 hr tablet Take 1 tablet (150 mg total) by mouth daily. 90 tablet 0   Cholecalciferol (VITAMIN D3) 1000 units CAPS Take 1,000 Units by mouth daily.     Cyanocobalamin (VITAMIN B-12) 1000 MCG SUBL Place 2,000 mcg under  the tongue daily.     dicyclomine (BENTYL) 10 MG capsule Take 1 capsule (10 mg total) by mouth every 8 (eight) hours as needed for spasms. 270 capsule 2   escitalopram (LEXAPRO) 10 MG tablet TAKE 1 & 1/2 (ONE & ONE-HALF) TABLETS BY MOUTH ONCE DAILY 135 tablet 0   escitalopram (LEXAPRO) 5 MG tablet Take 3 tablets (15 mg total) by mouth daily.     famotidine (PEPCID) 40 MG tablet Take 1 tablet (40 mg total) by mouth daily. 90 tablet 0   Multiple Vitamins-Minerals (PRESERVISION AREDS 2) CAPS Take 1 capsule by mouth 2 (two) times a week.     pantoprazole (PROTONIX) 40 MG tablet Take 1 tablet (40 mg total) by mouth daily. 90 tablet 3   potassium chloride (KLOR-CON) 10 MEQ tablet TAKE 1 TABLET BY MOUTH EVERY OTHER DAY WITH  FLUID  TABLET 30 tablet 0   SIMPLY SALINE 0.9 % AERS Place 1 spray into both nostrils in the morning and at bedtime.     No facility-administered medications prior to visit.     EXAM:  There were no vitals taken for this visit.  There is no height or weight on file to calculate BMI. Wt Readings from Last 3 Encounters:  06/13/22 102 lb (46.3 kg)  05/24/22 105 lb 12.8 oz (48 kg)  05/22/22 103 lb (46.7 kg)    Physical Exam: Vital signs reviewed FUX:NATF is a well-developed well-nourished alert cooperative    who appearsr stated age in no acute distress.  HEENT: normocephalic atraumatic , Eyes: PERRL EOM's full, conjunctiva clear, Nares: paten,t no deformity discharge or tenderness., Ears: no deformity EAC's clear TMs with normal landmarks. Mouth: clear OP, no lesions, edema.  Moist mucous membranes. Dentition in adequate repair. NECK: supple without  masses, thyromegaly or bruits. CHEST/PULM:  Clear to auscultation and percussion breath sounds equal no wheeze , rales or rhonchi. No chest wall deformities or tenderness. Breast: normal by inspection . No dimpling, discharge, masses, tenderness or discharge . CV: PMI is nondisplaced, S1 S2 no gallops, murmurs, rubs. Peripheral pulses are full without delay.No JVD .  ABDOMEN: Bowel sounds normal nontender  No guard or rebound, no hepato splenomegal no CVA tenderness.  No hernia. Extremtities:  No clubbing cyanosis or edema, no acute joint swelling or redness no focal atrophy NEURO:  Oriented x3, cranial nerves 3-12 appear to be intact, no obvious focal weakness,gait within normal limits no abnormal reflexes or asymmetrical SKIN: No acute rashes normal turgor, color, no bruising or petechiae. PSYCH: Oriented, good eye contact, no obvious depression anxiety, cognition and judgment appear normal. LN: no cervical axillary inguinal adenopathy  Lab Results  Component Value Date   WBC 14.6 (H) 04/19/2022   HGB 14.1 04/19/2022   HCT 42.4 04/19/2022   PLT 351.0 04/19/2022   GLUCOSE 83 05/05/2022   CHOL 164 05/07/2019   TRIG 76.0 05/07/2019   HDL 85.90 05/07/2019   LDLCALC 63 05/07/2019   ALT 21 01/27/2022   AST 17 01/27/2022   NA 136 05/05/2022   K 3.5 05/05/2022   CL 103 05/05/2022   CREATININE 0.79 05/05/2022   BUN 15 05/05/2022   CO2 22 05/05/2022   TSH 1.232 01/25/2022   HGBA1C 5.5 05/24/2021   MICROALBUR 1.6 04/26/2011    BP Readings from Last 3 Encounters:  06/13/22 120/88  05/24/22 116/70  05/22/22 102/62    Lab results reviewed with patient   ASSESSMENT AND PLAN:  Discussed the following assessment and plan:  No  diagnosis found. No follow-ups on file.  Patient Care Team: Jonathin Heinicke, Neta Mends, MD as PCP - Pervis Hocking, MD as Consulting Physician (Dermatology) Mateo Flow, MD as Attending Physician (Ophthalmology) Bufford Buttner, MD as Consulting Physician  (Dermatology) Van Clines, MD as Consulting Physician (Neurology) Tomma Lightning, MD as Consulting Physician (Pulmonary Disease) There are no Patient Instructions on file for this visit.  Neta Mends. Shavar Gorka M.D.

## 2022-07-25 ENCOUNTER — Ambulatory Visit (INDEPENDENT_AMBULATORY_CARE_PROVIDER_SITE_OTHER): Payer: Medicare Other | Admitting: Internal Medicine

## 2022-07-25 ENCOUNTER — Encounter: Payer: Self-pay | Admitting: Internal Medicine

## 2022-07-25 VITALS — BP 124/80 | HR 80 | Temp 98.1°F | Ht <= 58 in | Wt 97.5 lb

## 2022-07-25 DIAGNOSIS — I10 Essential (primary) hypertension: Secondary | ICD-10-CM

## 2022-07-25 DIAGNOSIS — R1310 Dysphagia, unspecified: Secondary | ICD-10-CM

## 2022-07-25 DIAGNOSIS — Z0000 Encounter for general adult medical examination without abnormal findings: Secondary | ICD-10-CM | POA: Diagnosis not present

## 2022-07-25 DIAGNOSIS — R54 Age-related physical debility: Secondary | ICD-10-CM | POA: Diagnosis not present

## 2022-07-25 DIAGNOSIS — R634 Abnormal weight loss: Secondary | ICD-10-CM

## 2022-07-25 DIAGNOSIS — R4189 Other symptoms and signs involving cognitive functions and awareness: Secondary | ICD-10-CM

## 2022-07-25 DIAGNOSIS — Z79899 Other long term (current) drug therapy: Secondary | ICD-10-CM

## 2022-07-25 DIAGNOSIS — F411 Generalized anxiety disorder: Secondary | ICD-10-CM

## 2022-07-25 DIAGNOSIS — E7800 Pure hypercholesterolemia, unspecified: Secondary | ICD-10-CM

## 2022-07-25 LAB — LIPID PANEL
Cholesterol: 182 mg/dL (ref 0–200)
HDL: 69.3 mg/dL (ref >39.00)
LDL Cholesterol: 91 mg/dL (ref 0–99)
NonHDL: 112.61
Total CHOL/HDL Ratio: 3
Triglycerides: 109 mg/dL (ref 0.0–149.0)
VLDL: 21.8 mg/dL (ref 0.0–40.0)

## 2022-07-25 LAB — HEPATIC FUNCTION PANEL
ALT: 8 U/L (ref 0–35)
AST: 13 U/L (ref 0–37)
Albumin: 4.2 g/dL (ref 3.5–5.2)
Alkaline Phosphatase: 50 U/L (ref 39–117)
Bilirubin, Direct: 0.1 mg/dL (ref 0.0–0.3)
Total Bilirubin: 0.7 mg/dL (ref 0.2–1.2)
Total Protein: 6.4 g/dL (ref 6.0–8.3)

## 2022-07-25 LAB — CBC WITH DIFFERENTIAL/PLATELET
Basophils Absolute: 0.1 10*3/uL (ref 0.0–0.1)
Basophils Relative: 1 % (ref 0.0–3.0)
Eosinophils Absolute: 0.1 10*3/uL (ref 0.0–0.7)
Eosinophils Relative: 1.9 % (ref 0.0–5.0)
HCT: 39.8 % (ref 36.0–46.0)
Hemoglobin: 13 g/dL (ref 12.0–15.0)
Lymphocytes Relative: 19.6 % (ref 12.0–46.0)
Lymphs Abs: 1.2 10*3/uL (ref 0.7–4.0)
MCHC: 32.7 g/dL (ref 30.0–36.0)
MCV: 93.8 fl (ref 78.0–100.0)
Monocytes Absolute: 0.4 10*3/uL (ref 0.1–1.0)
Monocytes Relative: 6.1 % (ref 3.0–12.0)
Neutro Abs: 4.6 10*3/uL (ref 1.4–7.7)
Neutrophils Relative %: 71.4 % (ref 43.0–77.0)
Platelets: 263 10*3/uL (ref 150.0–400.0)
RBC: 4.24 Mil/uL (ref 3.87–5.11)
RDW: 13.9 % (ref 11.5–15.5)
WBC: 6.4 10*3/uL (ref 4.0–10.5)

## 2022-07-25 LAB — BASIC METABOLIC PANEL
BUN: 15 mg/dL (ref 6–23)
CO2: 26 mEq/L (ref 19–32)
Calcium: 10 mg/dL (ref 8.4–10.5)
Chloride: 103 mEq/L (ref 96–112)
Creatinine, Ser: 0.94 mg/dL (ref 0.40–1.20)
GFR: 58.99 mL/min — ABNORMAL LOW (ref >60.00)
Glucose, Bld: 78 mg/dL (ref 70–99)
Potassium: 3.9 mEq/L (ref 3.5–5.1)
Sodium: 140 mEq/L (ref 135–145)

## 2022-07-25 LAB — MAGNESIUM: Magnesium: 2 mg/dL (ref 1.5–2.5)

## 2022-07-25 LAB — T4, FREE: Free T4: 0.73 ng/dL (ref 0.60–1.60)

## 2022-07-25 LAB — TSH: TSH: 2.41 u[IU]/mL (ref 0.35–5.50)

## 2022-07-25 LAB — HEMOGLOBIN A1C: Hgb A1c MFr Bld: 5 % (ref 4.6–6.5)

## 2022-07-25 NOTE — Patient Instructions (Addendum)
Agree with neurology update reevaluation for memory decline.  Disc with GI endoscopy  for the poss esophageal stricture .   Continue with PT and monitoring .   Checking updated labs today .  Then go from there.  Will need to address bone health also .

## 2022-07-25 NOTE — Telephone Encounter (Signed)
Thanks for the info . I will ask neuro to see her again and reassess She Sterling Big be helped by medication but not sure which would be best considering her Gi and other meds.seems like significant cognitive decline and weakness . Should continue with PT and seeing the GI team to get her nutrition better

## 2022-07-26 ENCOUNTER — Ambulatory Visit: Payer: Medicare Other

## 2022-07-26 DIAGNOSIS — R252 Cramp and spasm: Secondary | ICD-10-CM

## 2022-07-26 DIAGNOSIS — R262 Difficulty in walking, not elsewhere classified: Secondary | ICD-10-CM

## 2022-07-26 DIAGNOSIS — R296 Repeated falls: Secondary | ICD-10-CM

## 2022-07-26 DIAGNOSIS — R293 Abnormal posture: Secondary | ICD-10-CM

## 2022-07-26 DIAGNOSIS — M6281 Muscle weakness (generalized): Secondary | ICD-10-CM | POA: Diagnosis not present

## 2022-07-26 DIAGNOSIS — M5442 Lumbago with sciatica, left side: Secondary | ICD-10-CM

## 2022-07-26 NOTE — Therapy (Signed)
OUTPATIENT PHYSICAL THERAPY NEURO TREATMENT   Patient Name: Caitlin [name] MRN: [id number] DOB:1945-05-10, 77 y.o., female Today's Date: 07/26/2022    REFERRING PROVIDER: Berniece Andreas MD  END OF SESSION:  PT End of Session - 07/26/22 1402     Visit Number 4    Date for PT Re-Evaluation 08/31/22    Authorization Type [employer] Medicare    PT Start Time 1402    PT Stop Time 1448    PT Time Calculation (min) 46 min    Activity Tolerance Patient tolerated treatment well    Behavior During Therapy Flat affect             Past Medical History:  Diagnosis Date   Allergic rhinitis 07/11/2006   Back pain 10/09/2007   Blood transfusion without reported diagnosis    Diaphragmatic hernia 07/11/2006   Diverticulosis of colon    Eczema 07/11/2006   Elevated cholesterol 04/29/2012   Enteritis    Erythema nodosum    Tends to be seasonal has had a negative chest x-ray in the past negative PPD   Essential hypertension 07/11/2006   Fatigue 03/12/2007   Fibromyalgia    Gastroparesis 12/23/2008   Generalized anxiety disorder 09/01/2014   GERD (gastroesophageal reflux disease)    History of stricture and dilatation and Nissen surgery   Hearing loss, left ear 11/27/2008   History of cataract    bilateral; s/p surgery   Hyperglycemia 08/15/2006   Irritable bowel syndrome 12/23/2008   Leg cramps, nocturnal 11/11/2007   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   MCI (mild cognitive impairment) 10/22/2018   Obstructive sleep apnea 04/23/2007   uses CPAP nightly; mask has leak   Osteoarthritis 11/28/2011   Osteopenia    Prediabetes 04/19/2011   Vitamin B12 deficiency 05/13/2010   Past Surgical History:  Procedure Laterality Date   ABDOMINAL HYSTERECTOMY     bone spur     CATARACT EXTRACTION W/ INTRAOCULAR LENS  IMPLANT, BILATERAL     CESAREAN SECTION     x 1   CHOLECYSTECTOMY     COLONOSCOPY     DENTAL SURGERY     ESOPHAGOGASTRODUODENOSCOPY     stricture and dilatation   IR KYPHO  EA ADDL LEVEL THORACIC OR LUMBAR  04/28/2022   IR KYPHO THORACIC WITH BONE BIOPSY  04/28/2022   IR RADIOLOGIST EVAL & MGMT  04/20/2022   IR RADIOLOGIST EVAL & MGMT  05/19/2022   left shoulder surgery  06/08, 07/12/17   x 2   MOUTH SURGERY Right 06/03/2019   NISSEN FUNDOPLICATION     ROTATOR CUFF REPAIR Left    rt knee surgery     scope   UPPER GASTROINTESTINAL ENDOSCOPY     Patient Active Problem List   Diagnosis Date Noted   Acute pain of right shoulder due to trauma 01/27/2022   Compression fracture of thoracolumbar vertebra, closed, initial encounter (HCC) 01/25/2022   Hypoxia 01/23/2022   Hypokalemia 01/23/2022   Fall with injury 01/23/2022   MCI (mild cognitive impairment) 10/22/2018   Generalized anxiety disorder 09/01/2014   Hair loss 08/12/2013   Erythema nodosum 06/05/2012   Elevated cholesterol 04/29/2012   Osteoarthritis 11/28/2011   GERD (gastroesophageal reflux disease)    Prediabetes 04/19/2011   Hypotension 06/07/2010   Vitamin B12 deficiency 05/13/2010   Gastroparesis 12/23/2008   Irritable bowel syndrome 12/23/2008   Hearing loss, left ear 11/27/2008   Leg cramps, nocturnal 11/11/2007   Back pain 10/09/2007   Obstructive sleep apnea 04/23/2007   Fatigue 03/12/2007  Diverticulosis of colon 12/10/2006   Fibromyalgia 12/10/2006   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   Hyperglycemia 08/15/2006   Essential hypertension 07/11/2006   Allergic rhinitis 07/11/2006   Diaphragmatic hernia 07/11/2006   Eczema 07/11/2006    ONSET DATE: January 2024  REFERRING DIAG: Berniece Andreas MD  THERAPY DIAG:  Muscle weakness (generalized)  Difficulty in walking, not elsewhere classified  Repeated falls  Abnormal posture  Cramp and spasm  Chronic bilateral low back pain with left-sided sciatica  Rationale for Evaluation and Treatment: Rehabilitation  SUBJECTIVE:                                                                                                                                                                                              SUBJECTIVE STATEMENT: Patient states she was a little sore in her back after last visit.  Otherwise, doing well.    PERTINENT HISTORY:  Presumed osteoporosis secondary to multiple fractures including vertebral compression fractures in January and kyphoplasty in April IBS osteoporosis  PAIN:  PAIN:  Are you having pain? Yes NPRS scale: 2/10 Pain location: lower back, mid back later in the day Aggravating factors: later in the day legs give out  Relieving factors: Ok standing  PRECAUTIONS: Fall  WEIGHT BEARING RESTRICTIONS: No  FALLS: Has patient fallen in last 6 months? Yes. Number of falls 1  LIVING ENVIRONMENT: Lives with: lives in senior friendly community handicap accessible Lives in: House/apartment Stairs: No Has following equipment at home: Walker - 4 wheeled  PLOF: prior to kyphoplasty surgery, patient's husband states she was able to clean their home  PATIENT GOALS: walking, loves to clean house, be able to pick up trash from floor; be able to do laundry  OBJECTIVE:    COGNITION: Decreased ability to follow multi step directions;  needs demonstration, repetition and simple verbal cues   Short Physical Performance Battery:    Balance:       Side by side stance:  1  points (1)  Semi -tandem:   0  points (1)  Tandem:   0   points (2)    Gait Speed: (13.1 feet) done 2x take the best time: 1     points  13.40 sec                                           > 8.70 sec=1 pt  6.21- 8.70 sec=2 pt                                           4.82-6.20 sec=3 pt                                            < 4.82 = 4 pts    Repeated Chair Stands:   0   points  patient requires UE use to rise   (timer stopped when straight on 5th stand)                                                If used arms=0 pts                                              > 60 sec=0  pts                                              16.70 or more=1 pt                                               13.70-16.69=2 pts                                               11.20-13.69=3 pts                                               11.9 sec or less=4 pts Total score= 2     points <10/12 predictive of 1 or more mobility limitations and increased risk of mobility disability 6 or less/12 associated with a higher fall rate                                                   UPPER AND LOWER EXTREMITY ROM:   WFLS   LOWER EXTREMITY MMT:  Unable to rise from the chair without UE support  MMT Right Eval Left Eval  Hip flexion 3+ 3+  Hip extension    Hip abduction 3+ 3+  Hip adduction    Hip internal rotation    Hip external rotation    Knee flexion 4 4  Knee extension 3+ 3+  Ankle dorsiflexion    Ankle plantarflexion 3+ 3+  Ankle inversion    Ankle eversion    (Blank rows = not tested)  GAIT: Assistive device utilized: Environmental consultant - 4 wheeled Level of assistance: SBA Comments: patient tends to rise from sit to stand pulling from the walker when in unlocked position  FUNCTIONAL TESTS:  TUG 1:09  several attempts to rise with UE assist 5x sit to stand with heavy UE use: 29.12 sec 2 min walk test with RW: 123 feet  BERG 24/56 = high risk of falls 100%  TODAY'S TREATMENT:                                                                                                                              DATE: 07/26/22 Nustep x 5 min, level 2 Gait training with walker timed for endurance: emphasizing upright posture (patient able to walk 265.5 feet before needing to rest) x 5 min Seated tband rows 2 x 10 with yellow band Seated bilateral shoulder ER with yellow band 2 x 10 Seated bilateral shoulder horizontal abduction 2 x 10 with yellow band Seated toe and heel raises x 20 Seated up and over hurdle  Seated march x 20 Seated LAQ x 10 each LE Seated clam with green loop 2 x  10  DATE: 07/24/22 Seated anterior/posterior pelvic tilt 2x10 Seated LAQ x10, with 1# ankle weight x10 Seated marching 1# 2x10 Seated hip abd with UE horizontal abduction 1# 2x10 Seated reach up/across with leg rotated back x10 Supine bridge 2x10 Supine SLR 2x10 Supine clamshell red TB 2x10 Supine hip abd 2x10 Standing glute set with hands on cabinet low trap set 2x10 Standing step out + outstretch arm 2x10 Standing scap squeeze x10 Gait training 2 laps around ortho gym focusing on big steps using rollator 1/2 lap around ortho gym marching   DATE: 07/17/22 Nustep x 5 min, level 2 Seated toe and heel raises x 20 Seated march x 20 Seated LAQ x 10 each LE Seated clam with green loop 2 x 10 Instructed in safe sit to supine Supine hook lying clam with green loop 2 x 10 Hooklying with ball under knee SAQ 2 x 10 Supine hip abduction with towel under heel 2 x 10 Hook lying bridge 2 x 10 (could not lift buttocks off table- assisted with several reps on second set and then she was able to do 2-3 with a little buttock clearance) Lower trunk rotation x 20 Instructed in safe supine to sit  DATE: 6/20    PATIENT EDUCATION: Education details: Educated patient on anatomy and physiology of current symptoms, prognosis, plan of care as well as initial self care strategies to promote recovery Person educated: Patient Education method: Explanation Education comprehension: verbalized understanding  HOME EXERCISE PROGRAM: Access Code: [email or web address] URL: [email or web address] Date: 07/17/2022 Prepared by: Mikey Kirschner  Exercises - Supine Hip Abduction  - 1 x daily - 7 x weekly - 2 sets - 10 reps - Supine Bridge  - 1 x daily - 7 x weekly - 2 sets - 10 reps - Hooklying Clamshell with Resistance  -  1 x daily - 7 x weekly - 2 sets - 10 reps - Seated Hip Abduction with Resistance  - 1 x daily - 7 x weekly - 2 sets - 10 reps - Seated Long Arc Quad  - 1 x daily - 7 x weekly - 2 sets -  10 reps  GOALS: Goals reviewed with patient? Yes  SHORT TERM GOALS: Target date: 08/03/2022   The patient and husband will demonstrate knowledge of basic  exercises to promote mobility and strength for improved function Baseline: Goal status: INITIAL  2.  The patient will be able to walk for 4 minutes or 400 feet needed for short distance community ambulation Baseline:  Goal status: INITIAL  3.  The patient will have improved Timed Up and Go (TUG) time to    50 sec   sec indicating improved gait speed and LE strength  Baseline:  Goal status: INITIAL  4.  Able to rise sit to stand from standard height chair 1x without UE use Baseline:  Goal status: INITIAL     LONG TERM GOALS: Target date: 08/31/2022  The patient and husband will be independent in a safe self progression of a home exercise program to promote further recovery of function  Baseline:  Goal status: INITIAL  2.  The patient will have improved gait stamina and speed needed to ambulate 600 feet in 6 minutes  Baseline:  Goal status: INITIAL  3.  SPPB test improved to at least 7/12 indicating lower fall risk and decreased frailty Baseline:  Goal status: INITIAL  4.  Improved LE strength needed for standing to perform light housework (wiping counters, folding laundry) Baseline:  Goal status: INITIAL  5.  The patient will have an improved BERG balance score to   33 /56 indicating reduced risk of falls  Baseline:  Goal status: INITIAL  6. The patient will have improved gait stamina and speed needed to ambulate 600 feet in 6 minutes with RW INITIAL    ASSESSMENT:  CLINICAL IMPRESSION: Brynli is responding well to PT.  She has very little pain to speak of.  She is kyphotic but responds well to verbal cues.   She continues to have difficulty complying to exercises at home per spouse. Able to tolerate 1.5# ankle weight and get lift off during her bridge this session. Initiated more standing exercises. Focused  on making bigger steps and movements.   OBJECTIVE IMPAIRMENTS: decreased activity tolerance, decreased balance, decreased mobility, difficulty walking, decreased strength, decreased safety awareness, impaired perceived functional ability, and pain.   ACTIVITY LIMITATIONS: carrying, lifting, bending, squatting, stairs, bathing, toileting, dressing, hygiene/grooming, and locomotion level  PARTICIPATION LIMITATIONS: meal prep, cleaning, laundry, shopping, and community activity  PERSONAL FACTORS: Time since onset of injury/illness/exacerbation and 3+ comorbidities: osteoporosis with previous fractures, falls, decreased cognition  are also affecting patient's functional outcome.   REHAB POTENTIAL: Good  CLINICAL DECISION MAKING: moderate EVALUATION COMPLEXITY: moderate  PLAN:  PT FREQUENCY: 2x/week  PT DURATION: 8 weeks  PLANNED INTERVENTIONS: Therapeutic exercises, Therapeutic activity, Neuromuscular re-education, Balance training, Gait training, Patient/Family education, Self Care, Aquatic Therapy, Manual therapy, and Re-evaluation  PLAN FOR NEXT SESSION: Increase resistance on nu-step, Progress simple seated ex's for HEP;  add sit to stand, progress to standing exercise; gait with RW  Victorino Dike B. Davide Risdon, PT 07/26/22 11:16 PM  [facility] [address], [address] [place], [place] [address] Phone # [phone] Fax [phone]

## 2022-07-26 NOTE — Progress Notes (Signed)
Results are reassuring   renal function borderline again but stable  Potassium is normal  range    blood count normal  Cholesterol A1c and liver panel are now normal . ( Albumin is better  marker for nutirion ) waiting on one other test regarding  nutrition)

## 2022-07-28 ENCOUNTER — Encounter: Payer: Self-pay | Admitting: Physician Assistant

## 2022-07-28 ENCOUNTER — Ambulatory Visit: Payer: Medicare Other | Admitting: Physician Assistant

## 2022-07-28 ENCOUNTER — Ambulatory Visit: Payer: Medicare Other

## 2022-07-28 VITALS — BP 143/82 | HR 59 | Resp 18 | Ht <= 58 in | Wt 92.0 lb

## 2022-07-28 DIAGNOSIS — G309 Alzheimer's disease, unspecified: Secondary | ICD-10-CM

## 2022-07-28 DIAGNOSIS — F0280 Dementia in other diseases classified elsewhere without behavioral disturbance: Secondary | ICD-10-CM | POA: Diagnosis not present

## 2022-07-28 DIAGNOSIS — R413 Other amnesia: Secondary | ICD-10-CM

## 2022-07-28 MED ORDER — MEMANTINE HCL 5 MG PO TABS: ORAL_TABLET | ORAL | 11 refills | Status: DC

## 2022-07-28 NOTE — Patient Instructions (Signed)
It was a pleasure to see you today at our office.   Recommendations:   MRI of the brain, the radiology office will call you to arrange you appointment   Start Memantine 5mg  tablets at night Follow up in 2 months   For psychiatric meds, mood meds: Please have your primary care physician manage these medications.  If you have any severe symptoms of a stroke, or other severe issues such as confusion,severe chills or fever, etc call 911 or go to the ER as you may need to be evaluated further    For assessment of decision of mental capacity and competency:  Call Dr. Erick Blinks, geriatric psychiatrist at [phone]  Counseling regarding caregiver distress, including caregiver depression, anxiety and issues regarding community resources, adult day care programs, adult living facilities, or memory care questions:  please contact your  Primary Doctor's Social Worker   Whom to call: Memory  decline, memory medications: Call our office [phone]    [email or web address]   RECOMMENDATIONS FOR ALL PATIENTS WITH MEMORY PROBLEMS: 1. Continue to exercise (Recommend 30 minutes of walking everyday, or 3 hours every week) 2. Increase social interactions - continue going to [place] and enjoy social gatherings with friends and family 3. Eat healthy, avoid fried foods and eat more fruits and vegetables 4. Maintain adequate blood pressure, blood sugar, and blood cholesterol level. Reducing the risk of stroke and cardiovascular disease also helps promoting better memory. 5. Avoid stressful situations. Live a simple life and avoid aggravations. Organize your time and prepare for the next day in anticipation. 6. Sleep well, avoid any interruptions of sleep and avoid any distractions in the bedroom that may interfere with adequate sleep quality 7. Avoid sugar, avoid sweets as there is a strong link between excessive sugar intake, diabetes, and  cognitive impairment We discussed the Mediterranean diet, which has been shown to help patients reduce the risk of progressive memory disorders and reduces cardiovascular risk. This includes eating fish, eat fruits and green leafy vegetables, nuts like almonds and hazelnuts, walnuts, and also use olive oil. Avoid fast foods and fried foods as much as possible. Avoid sweets and sugar as sugar use has been linked to worsening of memory function.  There is always a concern of gradual progression of memory problems. If this is the case, then we may need to adjust level of care according to patient needs. Support, both to the patient and caregiver, should then be put into place.      You have been referred for a neuropsychological evaluation (i.e., evaluation of memory and thinking abilities). Please bring someone with you to this appointment if possible, as it is helpful for the doctor to hear from both you and another adult who knows you well. Please bring eyeglasses and hearing aids if you wear them.    The evaluation will take approximately 3 hours and has two parts:   The first part is a clinical interview with the neuropsychologist (Dr. Milbert Coulter or Dr. Roseanne Reno). During the interview, the neuropsychologist will speak with you and the individual you brought to the appointment.    The second part of the evaluation is testing with the doctor's technician Annabelle Harman or Selena Batten). During the testing, the technician will ask you to remember different types of material, solve problems, and answer some questionnaires. Your family member will not be present for this portion of the evaluation.   Please note: We must reserve several hours of the neuropsychologist's time and the psychometrician's time for your evaluation  appointment. As such, there is a No-Show fee of $100. If you are unable to attend any of your appointments, please contact our office as soon as possible to reschedule.      DRIVING: Regarding driving,  in patients with progressive memory problems, driving will be impaired. We advise to have someone else do the driving if trouble finding directions or if minor accidents are reported. Independent driving assessment is available to determine safety of driving.   If you are interested in the driving assessment, you can contact the following:  [employer] in [place] [phone]  [facility] [phone]  [facility] [phone]  [facility] [phone] or [phone]   FALL PRECAUTIONS: Be cautious when walking. Scan the area for obstacles that may increase the risk of trips and falls. When getting up in the mornings, sit up at the edge of the bed for a few minutes before getting out of bed. Consider elevating the bed at the head end to avoid drop of blood pressure when getting up. Walk always in a well-lit room (use night lights in the walls). Avoid area rugs or power cords from appliances in the middle of the walkways. Use a walker or a cane if necessary and consider physical therapy for balance exercise. Get your eyesight checked regularly.  FINANCIAL OVERSIGHT: Supervision, especially oversight when making financial decisions or transactions is also recommended.  HOME SAFETY: Consider the safety of the kitchen when operating appliances like stoves, microwave oven, and blender. Consider having supervision and share cooking responsibilities until no longer able to participate in those. Accidents with firearms and other hazards in the house should be identified and addressed as well.   ABILITY TO BE LEFT ALONE: If patient is unable to contact 911 operator, consider using LifeLine, or when the need is there, arrange for someone to stay with patients. Smoking is a fire hazard, consider supervision or cessation. Risk of wandering should be assessed by caregiver and if detected at any point, supervision and safe proof recommendations should be  instituted.  MEDICATION SUPERVISION: Inability to self-administer medication needs to be constantly addressed. Implement a mechanism to ensure safe administration of the medications.      Mediterranean Diet A Mediterranean diet refers to food and lifestyle choices that are based on the traditions of countries located on the [employer] [place]. This way of eating has been shown to help prevent certain conditions and improve outcomes for people who have chronic diseases, like kidney disease and heart disease. What are tips for following this plan? Lifestyle  Cook and eat meals together with your family, when possible. Drink enough fluid to keep your urine clear or pale yellow. Be physically active every day. This includes: Aerobic exercise like running or swimming. Leisure activities like gardening, walking, or housework. Get 7-8 hours of sleep each night. If recommended by your health care provider, drink red wine in moderation. This means 1 glass a day for nonpregnant women and 2 glasses a day for men. A glass of wine equals 5 oz (150 mL). Reading food labels  Check the serving size of packaged foods. For foods such as rice and pasta, the serving size refers to the amount of cooked product, not dry. Check the total fat in packaged foods. Avoid foods that have saturated fat or trans fats. Check the ingredients list for added sugars, such as corn syrup. Shopping  At the grocery store, buy most of your food from the areas near the walls of the  store. This includes: Fresh fruits and vegetables (produce). Grains, beans, nuts, and seeds. Some of these may be available in unpackaged forms or large amounts (in bulk). Fresh seafood. Poultry and eggs. Low-fat dairy products. Buy whole ingredients instead of prepackaged foods. Buy fresh fruits and vegetables in-season from local farmers markets. Buy frozen fruits and vegetables in resealable bags. If you do not have access to quality fresh  seafood, buy precooked frozen shrimp or canned fish, such as tuna, salmon, or sardines. Buy small amounts of raw or cooked vegetables, salads, or olives from the deli or salad bar at your store. Stock your pantry so you always have certain foods on hand, such as olive oil, canned tuna, canned tomatoes, rice, pasta, and beans. Cooking  Cook foods with extra-virgin olive oil instead of using butter or other vegetable oils. Have meat as a side dish, and have vegetables or grains as your main dish. This means having meat in small portions or adding small amounts of meat to foods like pasta or stew. Use beans or vegetables instead of meat in common dishes like chili or lasagna. Experiment with different cooking methods. Try roasting or broiling vegetables instead of steaming or sauteing them. Add frozen vegetables to soups, stews, pasta, or rice. Add nuts or seeds for added healthy fat at each meal. You can add these to yogurt, salads, or vegetable dishes. Marinate fish or vegetables using olive oil, lemon juice, garlic, and fresh herbs. Meal planning  Plan to eat 1 vegetarian meal one day each week. Try to work up to 2 vegetarian meals, if possible. Eat seafood 2 or more times a week. Have healthy snacks readily available, such as: Vegetable sticks with hummus. Greek yogurt. Fruit and nut trail mix. Eat balanced meals throughout the week. This includes: Fruit: 2-3 servings a day Vegetables: 4-5 servings a day Low-fat dairy: 2 servings a day Fish, poultry, or lean meat: 1 serving a day Beans and legumes: 2 or more servings a week Nuts and seeds: 1-2 servings a day Whole grains: 6-8 servings a day Extra-virgin olive oil: 3-4 servings a day Limit red meat and sweets to only a few servings a month What are my food choices? Mediterranean diet Recommended Grains: Whole-grain pasta. Brown rice. Bulgar wheat. Polenta. Couscous. Whole-wheat bread. Orpah Cobb. Vegetables: Artichokes. Beets.  Broccoli. Cabbage. Carrots. Eggplant. Green beans. Chard. Kale. Spinach. Onions. Leeks. Peas. Squash. Tomatoes. Peppers. Radishes. Fruits: Apples. Apricots. Avocado. Berries. Bananas. Cherries. Dates. Figs. Grapes. Lemons. Melon. Oranges. Peaches. Plums. Pomegranate. Meats and other protein foods: Beans. Almonds. Sunflower seeds. Pine nuts. Peanuts. Cod. Salmon. Scallops. Shrimp. Tuna. Tilapia. Clams. Oysters. Eggs. Dairy: Low-fat milk. Cheese. Greek yogurt. Beverages: Water. Red wine. Herbal tea. Fats and oils: Extra virgin olive oil. Avocado oil. Grape seed oil. Sweets and desserts: [place] yogurt with honey. Baked apples. Poached pears. Trail mix. Seasoning and other foods: Basil. Cilantro. Coriander. Cumin. Mint. Parsley. Sage. Rosemary. Tarragon. Garlic. Oregano. Thyme. Pepper. Balsalmic vinegar. Tahini. Hummus. Tomato sauce. Olives. Mushrooms. Limit these Grains: Prepackaged pasta or rice dishes. Prepackaged cereal with added sugar. Vegetables: Deep fried potatoes (french fries). Fruits: Fruit canned in syrup. Meats and other protein foods: Beef. Pork. Lamb. Poultry with skin. Hot dogs. Tomasa Blase. Dairy: Ice cream. Sour cream. Whole milk. Beverages: Juice. Sugar-sweetened soft drinks. Beer. Liquor and spirits. Fats and oils: Butter. Canola oil. Vegetable oil. Beef fat (tallow). Lard. Sweets and desserts: Cookies. Cakes. Pies. Candy. Seasoning and other foods: Mayonnaise. Premade sauces and marinades. The items listed may not  be a complete list. Talk with your dietitian about what dietary choices are right for you. Summary The Mediterranean diet includes both food and lifestyle choices. Eat a variety of fresh fruits and vegetables, beans, nuts, seeds, and whole grains. Limit the amount of red meat and sweets that you eat. Talk with your health care provider about whether it is safe for you to drink red wine in moderation. This means 1 glass a day for nonpregnant women and 2 glasses a day for  men. A glass of wine equals 5 oz (150 mL). This information is not intended to replace advice given to you by your health care provider. Make sure you discuss any questions you have with your health care provider. Document Released: 08/26/2015 Document Revised: 09/28/2015 Document Reviewed: 08/26/2015 Elsevier Interactive Patient Education  2017 [employer].

## 2022-07-28 NOTE — Progress Notes (Signed)
Assessment/Plan:    The patient is seen in neurologic consultation at the request of Caitlin [name], Caitlin Mends, MD for the evaluation of memory.  Caitlin [name] is a very pleasant 77 y.o. 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 She is not on antidementia medications. She has not seen BH as recommended on prior visits.  MoCA was unable to be performed. MMSE was 15/30. Unfortunately, there is significant cognitive decline compared to prior visit on 2022. She needs more assistance with ADLs and no longer drives. Findings are suspicious for Alzheimer's Disease    Dementia likely due to Alzheimer's Disease.   MRI brain without contrast to assess for underlying structural abnormality and assess vascular load  Start memantine  5 mg at night, goal 10 mg bid. Side effects discussed  Recommend good control of cardiovascular risk factors.   Recommend  CPAP for OSA Continue to control mood as per PCP Continue PT for balance and strength [place] up in 2 months   Subjective:    The patient is accompanied by her husband who supplements the history.    How long did patient have memory difficulties? " After falling on Dec 1, she went downhill quickly"-husband says. " She had anesthesia for ventral hernia and her husband feels that it has contributed to symptoms.  Patient has difficulty remembering recent conversations and people names or new information . She cannot change channels or use the phone, numbers are difficult to her . She needs more help with her ADLS.  repeats oneself? Endorsed, "she has always been quiet",  but asks  questions more often, especially with appts.Today she told him"I thought we were going to [facility]" as she forgot  that she was coming to this appointment. Disoriented when walking into a room?  At night she thinks is daytime. Denies except occasionally not remembering what patient came to the room for    Leaving objects in unusual places? May misplace some things. She collects napkins.    Wandering behavior? denies   Any personality changes ? Denies. Obsessed with wetness. " there is something wet in the ceiling", does not like water Any history of depression?: denies   Hallucinations or paranoia? Over the last couple of weeks she has been seeing deceased relatives. Husband says that she has done so for a long time. Seizures? denies    Any sleep changes?  Does not sleep well, uses a sleeping pill. Denies  vivid dreams, REM behavior or sleepwalking   Sleep apnea?  Has not been using the CPAP Any hygiene concerns?  Uses the walker for the shower Independent of bathing and dressing? She starts getting dress and she does not remember how to complete the tasks, he has to help her otherwise she may stay with the pants half down. She chooses colors well, however.  Does the patient need help with medications? Husband  is in charge   Who is in charge of the finances? Husband  is in charge     Any changes in appetite?  Lost weight and "now they are working on my appetite. She has a GERD procedure ( ?dilatation) in August.  Patient have trouble swallowing she has esophageal narrowing which may cause some trouble with digestion. Does the patient cook?  No  Any headaches?  denies   Chronic back pain? Endorsed  Ambulates with difficulty? Needs a walker to ambulate, due to deconditioning. Doing PT. She has chronic cramps in the legs  Recent falls or head injuries? "After a fall she hit the hip without fracture but has pain in the area". She needs a walker to ambulate Vision changes? R eye floaters. Sees an  eye doctor  Stroke like symptoms?  denies   Any tremors?  denies   Any anosmia?  denies   Any incontinence of urine? Endorsed, wears pads . She is afraid of getting wet , especially since the surgery.  Any bowel dysfunction? She has IBS.    Patient lives  with her husband  History of heavy alcohol intake? denies   History of heavy tobacco use? denies   Family history of dementia?  Sister has dementia, mother, unknown type.      Does patient drive? No longer drives  Neuropsychological evaluation 06/17/2020. Briefly, results suggested primary deficits surrounding both attention/concentration and verbal fluency. Performance variability was further noted across processing speed and executive functions. Relative to her previous evaluation, good stability was exhibited across a majority of assessments. Mild declines were exhibited across attention/concentration, executive functioning, and phonemic fluency. Depressive symptoms across mood-based questionnaires were also noted to have worsened. An improvement was seen across her copy of a complex figure, likely due to improving visual acuity post cataract surgery. Across mood-related questionnaires, she reported acute levels of severe anxiety and severe depression. She also reported mild sleep dysfunction, likely related to her history of sleep apnea and difficulty finding a good-fitting CPAP mask. Overall, the most likely etiology of reported dysfunction continues to be a combination of significant psychiatric distress (recently worsened due to complications surrounding IBS), diffuse chronic pain related to her history of fibromyalgia, and chronic sleep dysfunction.    [name] was accompanied by her husband during the current telephone call. They were within their residence while I was within my office. I discussed the limitations of evaluation and management by telemedicine and the availability of in person appointments. [name] expressed her understanding  and agreed to proceed. Content of the current session focused on the results of her neuropsychological evaluation. [name] and her husband were given the opportunity to ask questions and their questions were answered. They were encouraged to reach out should additional questions arise. A copy of her report was mailed at the conclusion of the visit.    History on Initial Assessment 09/01/2014: This is a 77 yo RH woman with a history of hypertension, diet-controlled hyperlipidemia, IBS, GERD, chronic neck and back pain, depression and anxiety, who presented for worsening memory. She reports that memory changes started several years ago, she would be unable to think of a name or word she wanted to say. She has had to write everything down otherwise she would forget appointments. She recalls the first time this happened was 12 years ago, she got a reminder call about a dentist appointment 2 days prior but still missed it. She is has been stressed and worried because 2 of her husband's family members have recently passed away with dementia. She continues to drive without getting lost. She denies missing her regular medications but occasionally forgets her vitamins. She has to stay by the stove when cooking, one time she walked away from soup she was making  and forgot about it. Her husband is in charge of bill payments. She has always had problems multi-tasking, but has noticed this has been worse the past couple of years, she easily loses her train of thought when distracted. Her maternal grandmother and 2 brothers were diagnosed with dementia. Her mother and sister had memory problems before they passed away from other medical conditions. She denies any significant head injuries, no alcohol use.    Diagnostic Data: MRI brain in 2012 was personally reviewed today, unremarkable with mild diffuse atrophy. Similar to MRI brain done 08/2014 as above.  Allergies  Allergen Reactions   Codeine Phosphate Other (See  Comments)    Hallucinations   Mobic [Meloxicam] Diarrhea   Protonix [Pantoprazole Sodium] Diarrhea and Other (See Comments)    "Leg nodules," also   Ibuprofen Diarrhea and Other (See Comments)    Tears up the stomach   Peppermint Oil Other (See Comments)    Severe heartburn   Aleve [Naproxen] Nausea Only    Upset stomach if takes more than once a day   Colestipol Hcl     Patient reports reaction is "erythema multiforme"   Omeprazole-Sodium Bicarbonate Diarrhea and Other (See Comments)    Severe diarrhea, stomach cramps   Sodium Other (See Comments)    No salt!   Sulfamethoxazole Diarrhea and Nausea And Vomiting    Current Outpatient Medications  Medication Instructions   acetaminophen (TYLENOL) 650 mg, Oral, Every 6 hours PRN   buPROPion (WELLBUTRIN XL) 150 mg, Oral, Daily   escitalopram (LEXAPRO) 10 MG tablet TAKE 1 & 1/2 (ONE & ONE-HALF) TABLETS BY MOUTH ONCE DAILY   escitalopram (LEXAPRO) 15 mg, Oral, Daily   famotidine (PEPCID) 40 mg, Oral, Daily   memantine (NAMENDA) 5 MG tablet Take 1 tablet at night   Multiple Vitamins-Minerals (PRESERVISION AREDS 2) CAPS 1 capsule, 2 times weekly   pantoprazole (PROTONIX) 40 mg, Oral, Daily   potassium chloride (KLOR-CON) 10 MEQ tablet TAKE 1 TABLET BY MOUTH EVERY OTHER DAY WITH  FLUID  TABLET   SIMPLY SALINE 0.9 % AERS 1 spray, Each Nare, 2 times daily   Vitamin B-12 2,000 mcg, Sublingual, Daily   Vitamin D3 1,000 Units, Daily     VITALS:   Vitals:   07/28/22 1318  BP: (!) 143/82  Pulse: (!) 59  Resp: 18  SpO2: 95%  Weight: 92 lb (41.7 kg)  Height: 4\' 8"  (1.422 m)     PHYSICAL EXAM   HEENT:  Normocephalic, atraumatic. The mucous membranes are moist. The superficial temporal arteries are without ropiness or tenderness. Cardiovascular: Regular rate and rhythm. Lungs: Clear to auscultation bilaterally. Neck: There are no carotid bruits noted bilaterally.  NEUROLOGICAL:     No data to display             07/28/2022     3:00 PM 04/04/2020    6:00 PM 03/12/2018    3:00 PM  MMSE - Mini Mental State Exam  Orientation to time 1 5 5   Orientation to Place 5 5 5   Registration 3 3 3   Attention/ Calculation 0 3 3  Recall 0 3 3  Language- name 2 objects 1 2 2   Language- repeat 1 1 1   Language- follow 3 step command 3 3 3   Language- read & follow direction 1 1 1   Write a sentence 0 1 1  Copy design 0 1 1  Total score 15 28 28      Orientation:  Alert and oriented to person,  place and not to time. No aphasia or dysarthria. Fund of knowledge is reduced. Recent and remote memory impaired.  Attention and concentration are reduced.  Able to name objects and unable to repeat phrases. Delayed recall    Cranial nerves: There is good facial symmetry. Extraocular muscles are intact and visual fields are full to confrontational testing. Speech is soft,  fluent and clear, no tongue deviation. Hearing is intact to conversational tone.  Tone: Tone is good throughout. Sensation: Sensation is intact to light touch and pinprick throughout. Vibration is intact at the bilateral big toe. Coordination: The patient has difficulty with RAM's or FNF bilaterally, FTN Motor: Strength is 5/5 in the bilateral upper and lower extremities. There is no pronator drift. There are no fasciculations noted. DTR's: Deep tendon reflexes are 2/4 .  Plantar responses are downgoing bilaterally. Gait and Station: The patient is able to ambulate but  Needs a walker  Gait is cautious and narrow. Stride short       Thank you for allowing [place] the opportunity to participate in the care of this nice patient. Please do not hesitate to contact us for any questions or concerns.   Total time spent on today's visit was 60 minutes dedicated to this patient today, preparing to see patient, examining the patient, ordering tests and/or medications and counseling the patient, documenting clinical information in the EHR or other health record, independently interpreting  results and communicating results to the patient/family, discussing treatment and goals, answering patient's questions and coordinating care.  Cc:  Caitlin [name], Caitlin Mends, MD  Marlowe Kays 07/28/2022 4:13 PM

## 2022-08-01 ENCOUNTER — Ambulatory Visit: Payer: Medicare Other | Admitting: Physical Therapy

## 2022-08-01 DIAGNOSIS — R262 Difficulty in walking, not elsewhere classified: Secondary | ICD-10-CM

## 2022-08-01 DIAGNOSIS — R296 Repeated falls: Secondary | ICD-10-CM

## 2022-08-01 DIAGNOSIS — M6281 Muscle weakness (generalized): Secondary | ICD-10-CM | POA: Diagnosis not present

## 2022-08-01 NOTE — Therapy (Signed)
OUTPATIENT PHYSICAL THERAPY NEURO TREATMENT   Patient Name: Caitlin [name] MRN: [id number] DOB:[birth year], 77 y.o., female Today's Date: 08/01/2022    REFERRING PROVIDER: Berniece Andreas MD  END OF SESSION:  PT End of Session - 08/01/22 1140     Visit Number 5    Date for PT Re-Evaluation 08/31/22    Authorization Type [employer] Medicare    PT Start Time 1145    PT Stop Time 1225    PT Time Calculation (min) 40 min    Activity Tolerance Patient tolerated treatment well    Behavior During Therapy Flat affect             Past Medical History:  Diagnosis Date   Allergic rhinitis 07/11/2006   Back pain 10/09/2007   Blood transfusion without reported diagnosis    Diaphragmatic hernia 07/11/2006   Diverticulosis of colon    Eczema 07/11/2006   Elevated cholesterol 04/29/2012   Enteritis    Erythema nodosum    Tends to be seasonal has had a negative chest x-ray in the past negative PPD   Essential hypertension 07/11/2006   Fatigue 03/12/2007   Fibromyalgia    Gastroparesis 12/23/2008   Generalized anxiety disorder 09/01/2014   GERD (gastroesophageal reflux disease)    History of stricture and dilatation and Nissen surgery   Hearing loss, left ear 11/27/2008   History of cataract    bilateral; s/p surgery   Hyperglycemia 08/15/2006   Irritable bowel syndrome 12/23/2008   Leg cramps, nocturnal 11/11/2007   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   MCI (mild cognitive impairment) 10/22/2018   Obstructive sleep apnea 04/23/2007   uses CPAP nightly; mask has leak   Osteoarthritis 11/28/2011   Osteopenia    Prediabetes 04/19/2011   Vitamin B12 deficiency 05/13/2010   Past Surgical History:  Procedure Laterality Date   ABDOMINAL HYSTERECTOMY     bone spur     CATARACT EXTRACTION W/ INTRAOCULAR LENS  IMPLANT, BILATERAL     CESAREAN SECTION     x 1   CHOLECYSTECTOMY     COLONOSCOPY     DENTAL SURGERY     ESOPHAGOGASTRODUODENOSCOPY     stricture and dilatation   IR KYPHO  EA ADDL LEVEL THORACIC OR LUMBAR  04/28/2022   IR KYPHO THORACIC WITH BONE BIOPSY  04/28/2022   IR RADIOLOGIST EVAL & MGMT  04/20/2022   IR RADIOLOGIST EVAL & MGMT  05/19/2022   left shoulder surgery  06/08, 07/12/17   x 2   MOUTH SURGERY Right 06/03/2019   NISSEN FUNDOPLICATION     ROTATOR CUFF REPAIR Left    rt knee surgery     scope   UPPER GASTROINTESTINAL ENDOSCOPY     Patient Active Problem List   Diagnosis Date Noted   Acute pain of right shoulder due to trauma 01/27/2022   Compression fracture of thoracolumbar vertebra, closed, initial encounter (HCC) 01/25/2022   Hypoxia 01/23/2022   Hypokalemia 01/23/2022   Fall with injury 01/23/2022   MCI (mild cognitive impairment) 10/22/2018   Generalized anxiety disorder 09/01/2014   Hair loss 08/12/2013   Erythema nodosum 06/05/2012   Elevated cholesterol 04/29/2012   Osteoarthritis 11/28/2011   GERD (gastroesophageal reflux disease)    Prediabetes 04/19/2011   Hypotension 06/07/2010   Vitamin B12 deficiency 05/13/2010   Gastroparesis 12/23/2008   Irritable bowel syndrome 12/23/2008   Hearing loss, left ear 11/27/2008   Leg cramps, nocturnal 11/11/2007   Back pain 10/09/2007   Obstructive sleep apnea 04/23/2007   Fatigue 03/12/2007  Diverticulosis of colon 12/10/2006   Fibromyalgia 12/10/2006   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   Hyperglycemia 08/15/2006   Essential hypertension 07/11/2006   Allergic rhinitis 07/11/2006   Diaphragmatic hernia 07/11/2006   Eczema 07/11/2006    ONSET DATE: January 2024  REFERRING DIAG: Caitlin Andreas MD  THERAPY DIAG:  Muscle weakness (generalized)  Difficulty in walking, not elsewhere classified  Repeated falls  Rationale for Evaluation and Treatment: Rehabilitation  SUBJECTIVE:                                                                                                                                                                                             SUBJECTIVE  STATEMENT: No pain right now.  States her back doesn't hurt at rest but it would with housework.  Husband states it's been a busy morning with a notary doing paperwork at the house.      PERTINENT HISTORY:  Per neurology note 7/12: Alzheimer's suspected Mid to late August procedure to stretch esophagus Presumed osteoporosis secondary to multiple fractures including vertebral compression fractures in January and kyphoplasty in April IBS osteoporosis Husband is Caitlin [name] PAIN:  PAIN:  Are you having pain?  NPRS scale: 0/10 Pain location: lower back, mid back later in the day Aggravating factors: later in the day legs give out  Relieving factors: Ok standing  PRECAUTIONS: Fall  WEIGHT BEARING RESTRICTIONS: No  FALLS: Has patient fallen in last 6 months? Yes. Number of falls 1  LIVING ENVIRONMENT: Lives with: lives in senior friendly community handicap accessible Lives in: House/apartment Stairs: No Has following equipment at home: Walker - 4 wheeled  PLOF: prior to kyphoplasty surgery, patient's husband states she was able to clean their home  PATIENT GOALS: walking, loves to clean house, be able to pick up trash from floor; be able to do laundry  OBJECTIVE:    COGNITION: Decreased ability to follow multi step directions;  needs demonstration, repetition and simple verbal cues   Short Physical Performance Battery:    Balance:       Side by side stance:  1  points (1)  Semi -tandem:   0  points (1)  Tandem:   0   points (2)    Gait Speed: (13.1 feet) done 2x take the best time: 1     points  13.40 sec                                           > 8.70 sec=1 pt  6.21- 8.70 sec=2 pt                                           4.82-6.20 sec=3 pt                                            < 4.82 = 4 pts    Repeated Chair Stands:   0   points  patient requires UE use to rise   (timer stopped when straight on 5th stand)                                                 If used arms=0 pts                                              > 60 sec=0 pts                                              16.70 or more=1 pt                                               13.70-16.69=2 pts                                               11.20-13.69=3 pts                                               11.9 sec or less=4 pts Total score= 2     points <10/12 predictive of 1 or more mobility limitations and increased risk of mobility disability 6 or less/12 associated with a higher fall rate                                                   UPPER AND LOWER EXTREMITY ROM:   WFLS   LOWER EXTREMITY MMT:  Unable to rise from the chair without UE support  MMT Right Eval Left Eval  Hip flexion 3+ 3+  Hip extension    Hip abduction 3+ 3+  Hip adduction    Hip internal rotation    Hip external rotation    Knee flexion 4 4  Knee extension 3+ 3+  Ankle dorsiflexion    Ankle plantarflexion 3+ 3+  Ankle inversion    Ankle eversion    (Blank rows = not tested)  GAIT: Assistive device utilized: Environmental consultant - 4 wheeled Level of assistance: SBA Comments: patient tends to rise from sit to stand pulling from the walker when in unlocked position  FUNCTIONAL TESTS:  TUG 1:09  several attempts to rise with UE assist 5x sit to stand with heavy UE use: 29.12 sec 2 min walk test with RW: 123 feet  BERG 24/56 = high risk of falls 100%  TODAY'S TREATMENT:      DATE: 08/01/22 Nustep x 6 min, level 4 (new model) Seated tband rows 2 x 10 with ]red band Seated 1# shoulder overhead press double 8x each side Seated 4# on knee over red line 10x right/left  Seated 4# bil bicep curls 10x Sit to stand 3 sets of 5: Ues used on armrests with reaching for low and medium targets upon rising Gait with NO RW; gait belt CGA 120 feet Medium fatigue level Likes Hessie Knows; Saw Emmit Pomfret in [place] (may incorporate music in future sessions to inspire movement) Gait with  RW 60 feet with SBA                                                                                                                      DATE: 07/26/22 Nustep x 5 min, level 2 Gait training with walker timed for endurance: emphasizing upright posture (patient able to walk 265.5 feet before needing to rest) x 5 min Seated tband rows 2 x 10 with yellow band Seated bilateral shoulder ER with yellow band 2 x 10 Seated bilateral shoulder horizontal abduction 2 x 10 with yellow band Seated toe and heel raises x 20 Seated up and over hurdle  Seated march x 20 Seated LAQ x 10 each LE Seated clam with green loop 2 x 10  DATE: 07/24/22 Seated anterior/posterior pelvic tilt 2x10 Seated LAQ x10, with 1# ankle weight x10 Seated marching 1# 2x10 Seated hip abd with UE horizontal abduction 1# 2x10 Seated reach up/across with leg rotated back x10 Supine bridge 2x10 Supine SLR 2x10 Supine clamshell red TB 2x10 Supine hip abd 2x10 Standing glute set with hands on cabinet low trap set 2x10 Standing step out + outstretch arm 2x10 Standing scap squeeze x10 Gait training 2 laps around ortho gym focusing on big steps using rollator 1/2 lap around ortho gym marching   PATIENT EDUCATION: Education details: Educated patient on anatomy and physiology of current symptoms, prognosis, plan of care as well as initial self care strategies to promote recovery Person educated: Patient Education method: Explanation Education comprehension: verbalized understanding  HOME EXERCISE PROGRAM: Access Code: [email or web address] URL: [email or web address] Date: 07/17/2022 Prepared by: Mikey Kirschner  Exercises - Supine Hip Abduction  - 1 x daily - 7 x weekly - 2 sets - 10 reps - Supine Bridge  - 1 x daily - 7 x weekly - 2 sets - 10 reps - Hooklying Clamshell with Resistance  - 1 x daily - 7 x weekly - 2 sets - 10 reps - Seated Hip Abduction with Resistance  -  1 x daily - 7 x weekly - 2 sets - 10 reps - Seated  Long Arc Quad  - 1 x daily - 7 x weekly - 2 sets - 10 reps  GOALS: Goals reviewed with patient? Yes  SHORT TERM GOALS: Target date: 08/03/2022   The patient and husband will demonstrate knowledge of basic  exercises to promote mobility and strength for improved function Baseline: Goal status: met 7/16  2.  The patient will be able to walk for 4 minutes or 400 feet needed for short distance community ambulation Baseline:  Goal status: INITIAL  3.  The patient will have improved Timed Up and Go (TUG) time to    50 sec   sec indicating improved gait speed and LE strength  Baseline:  Goal status: INITIAL  4.  Able to rise sit to stand from standard height chair 1x without UE use Baseline:  Goal status: INITIAL     LONG TERM GOALS: Target date: 08/31/2022  The patient and husband will be independent in a safe self progression of a home exercise program to promote further recovery of function  Baseline:  Goal status: INITIAL  2.  The patient will have improved gait stamina and speed needed to ambulate 600 feet in 6 minutes  Baseline:  Goal status: INITIAL  3.  SPPB test improved to at least 7/12 indicating lower fall risk and decreased frailty Baseline:  Goal status: INITIAL  4.  Improved LE strength needed for standing to perform light housework (wiping counters, folding laundry) Baseline:  Goal status: INITIAL  5.  The patient will have an improved BERG balance score to   33 /56 indicating reduced risk of falls  Baseline:  Goal status: INITIAL  6. The patient will have improved gait stamina and speed needed to ambulate 600 feet in 6 minutes with RW INITIAL    ASSESSMENT:  CLINICAL IMPRESSION: Easily distracted but better in a quieter environment in cancer rehab gym.  Improved speed and less reliance on Ues with sit to stand noted today.  Simple cues and max of 2 step directions work well.  CGA for ambulation for safety without RW but no loss of balance.  Therapist  monitoring response to all interventions and modifying treatment accordingly.    OBJECTIVE IMPAIRMENTS: decreased activity tolerance, decreased balance, decreased mobility, difficulty walking, decreased strength, decreased safety awareness, impaired perceived functional ability, and pain.   ACTIVITY LIMITATIONS: carrying, lifting, bending, squatting, stairs, bathing, toileting, dressing, hygiene/grooming, and locomotion level  PARTICIPATION LIMITATIONS: meal prep, cleaning, laundry, shopping, and community activity  PERSONAL FACTORS: Time since onset of injury/illness/exacerbation and 3+ comorbidities: osteoporosis with previous fractures, falls, decreased cognition  are also affecting patient's functional outcome.   REHAB POTENTIAL: Good  CLINICAL DECISION MAKING: moderate  EVALUATION COMPLEXITY: moderate  PLAN:  PT FREQUENCY: 2x/week  PT DURATION: 8 weeks  PLANNED INTERVENTIONS: Therapeutic exercises, Therapeutic activity, Neuromuscular re-education, Balance training, Gait training, Patient/Family education, Self Care, Aquatic Therapy, Manual therapy, and Re-evaluation  PLAN FOR NEXT SESSION: check STGs next visit: 4 min walk test, TUG, 1x sit to stand without hands check; nu-step, Progress simple seated ex's for warm up (add music);  sit to stand, progress to standing exercise; gait with RW or without assistive device with CGA  Lavinia Sharps, PT 08/01/22 2:53 PM Phone: [phone] Fax: [phone]  [facility] [address], [address] [place], [place] [address] Phone # [phone] Fax [phone]

## 2022-08-03 ENCOUNTER — Ambulatory Visit: Payer: Medicare Other | Admitting: Physical Therapy

## 2022-08-03 ENCOUNTER — Other Ambulatory Visit: Payer: Self-pay | Admitting: Internal Medicine

## 2022-08-03 DIAGNOSIS — M6281 Muscle weakness (generalized): Secondary | ICD-10-CM | POA: Diagnosis not present

## 2022-08-03 DIAGNOSIS — R262 Difficulty in walking, not elsewhere classified: Secondary | ICD-10-CM

## 2022-08-03 DIAGNOSIS — R296 Repeated falls: Secondary | ICD-10-CM

## 2022-08-03 NOTE — Therapy (Signed)
OUTPATIENT PHYSICAL THERAPY NEURO TREATMENT   Patient Name: Caitlin [name] MRN: [id number] DOB:1945-03-11, 77 y.o., female Today's Date: 08/03/2022    REFERRING PROVIDER: Berniece Andreas MD  END OF SESSION:  PT End of Session - 08/03/22 1152     Visit Number 6    Date for PT Re-Evaluation 08/31/22    Authorization Type [employer]    PT Start Time 1145    PT Stop Time 1230    PT Time Calculation (min) 45 min    Activity Tolerance Patient tolerated treatment well             Past Medical History:  Diagnosis Date   Allergic rhinitis 07/11/2006   Back pain 10/09/2007   Blood transfusion without reported diagnosis    Diaphragmatic hernia 07/11/2006   Diverticulosis of colon    Eczema 07/11/2006   Elevated cholesterol 04/29/2012   Enteritis    Erythema nodosum    Tends to be seasonal has had a negative chest x-ray in the past negative PPD   Essential hypertension 07/11/2006   Fatigue 03/12/2007   Fibromyalgia    Gastroparesis 12/23/2008   Generalized anxiety disorder 09/01/2014   GERD (gastroesophageal reflux disease)    History of stricture and dilatation and Nissen surgery   Hearing loss, left ear 11/27/2008   History of cataract    bilateral; s/p surgery   Hyperglycemia 08/15/2006   Irritable bowel syndrome 12/23/2008   Leg cramps, nocturnal 11/11/2007   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   MCI (mild cognitive impairment) 10/22/2018   Obstructive sleep apnea 04/23/2007   uses CPAP nightly; mask has leak   Osteoarthritis 11/28/2011   Osteopenia    Prediabetes 04/19/2011   Vitamin B12 deficiency 05/13/2010   Past Surgical History:  Procedure Laterality Date   ABDOMINAL HYSTERECTOMY     bone spur     CATARACT EXTRACTION W/ INTRAOCULAR LENS  IMPLANT, BILATERAL     CESAREAN SECTION     x 1   CHOLECYSTECTOMY     COLONOSCOPY     DENTAL SURGERY     ESOPHAGOGASTRODUODENOSCOPY     stricture and dilatation   IR KYPHO EA ADDL LEVEL THORACIC OR LUMBAR   04/28/2022   IR KYPHO THORACIC WITH BONE BIOPSY  04/28/2022   IR RADIOLOGIST EVAL & MGMT  04/20/2022   IR RADIOLOGIST EVAL & MGMT  05/19/2022   left shoulder surgery  06/08, 07/12/17   x 2   MOUTH SURGERY Right 06/03/2019   NISSEN FUNDOPLICATION     ROTATOR CUFF REPAIR Left    rt knee surgery     scope   UPPER GASTROINTESTINAL ENDOSCOPY     Patient Active Problem List   Diagnosis Date Noted   Acute pain of right shoulder due to trauma 01/27/2022   Compression fracture of thoracolumbar vertebra, closed, initial encounter (HCC) 01/25/2022   Hypoxia 01/23/2022   Hypokalemia 01/23/2022   Fall with injury 01/23/2022   MCI (mild cognitive impairment) 10/22/2018   Generalized anxiety disorder 09/01/2014   Hair loss 08/12/2013   Erythema nodosum 06/05/2012   Elevated cholesterol 04/29/2012   Osteoarthritis 11/28/2011   GERD (gastroesophageal reflux disease)    Prediabetes 04/19/2011   Hypotension 06/07/2010   Vitamin B12 deficiency 05/13/2010   Gastroparesis 12/23/2008   Irritable bowel syndrome 12/23/2008   Hearing loss, left ear 11/27/2008   Leg cramps, nocturnal 11/11/2007   Back pain 10/09/2007   Obstructive sleep apnea 04/23/2007   Fatigue 03/12/2007   Diverticulosis of colon 12/10/2006  Fibromyalgia 12/10/2006   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   Hyperglycemia 08/15/2006   Essential hypertension 07/11/2006   Allergic rhinitis 07/11/2006   Diaphragmatic hernia 07/11/2006   Eczema 07/11/2006    ONSET DATE: January 2024  REFERRING DIAG: Berniece Andreas MD  THERAPY DIAG:  Muscle weakness (generalized)  Difficulty in walking, not elsewhere classified  Repeated falls  Rationale for Evaluation and Treatment: Rehabilitation  SUBJECTIVE:                                                                                                                                                                                             SUBJECTIVE STATEMENT: Patient states she's  doing OK and that she has a little pain in her back but not much.      PERTINENT HISTORY:  Per neurology note 7/12: Alzheimer's suspected Mid to late August procedure to stretch esophagus Presumed osteoporosis secondary to multiple fractures including vertebral compression fractures in January and kyphoplasty in April IBS osteoporosis Husband is [name] PAIN:  PAIN:  Are you having pain? yes NPRS scale: "a little"/10 Pain location: lower back, mid back later in the day Aggravating factors: later in the day legs give out  Relieving factors: Ok standing  PRECAUTIONS: Fall  WEIGHT BEARING RESTRICTIONS: No  FALLS: Has patient fallen in last 6 months? Yes. Number of falls 1  LIVING ENVIRONMENT: Lives with: lives in senior friendly community handicap accessible Lives in: House/apartment Stairs: No Has following equipment at home: Walker - 4 wheeled  PLOF: prior to kyphoplasty surgery, patient's husband states she was able to clean their home  PATIENT GOALS: walking, loves to clean house, be able to pick up trash from floor; be able to do laundry  OBJECTIVE:    COGNITION: Decreased ability to follow multi step directions;  needs demonstration, repetition and simple verbal cues   Short Physical Performance Battery:    Balance:       Side by side stance:  1  points (1)  Semi -tandem:   0  points (1)  Tandem:   0   points (2)    Gait Speed: (13.1 feet) done 2x take the best time: 1     points  13.40 sec                                           > 8.70 sec=1 pt  6.21- 8.70 sec=2 pt                                           4.82-6.20 sec=3 pt                                            < 4.82 = 4 pts    Repeated Chair Stands:   0   points  patient requires UE use to rise   (timer stopped when straight on 5th stand)                                                If used arms=0 pts                                              > 60 sec=0  pts                                              16.70 or more=1 pt                                               13.70-16.69=2 pts                                               11.20-13.69=3 pts                                               11.9 sec or less=4 pts Total score= 2     points <10/12 predictive of 1 or more mobility limitations and increased risk of mobility disability 6 or less/12 associated with a higher fall rate                                                   UPPER AND LOWER EXTREMITY ROM:   WFLS   LOWER EXTREMITY MMT:  Unable to rise from the chair without UE support  MMT Right Eval Left Eval  Hip flexion 3+ 3+  Hip extension    Hip abduction 3+ 3+  Hip adduction    Hip internal rotation    Hip external rotation    Knee flexion 4 4  Knee extension 3+ 3+  Ankle dorsiflexion    Ankle plantarflexion 3+ 3+  Ankle inversion    Ankle eversion    (Blank rows = not tested)  GAIT: Assistive device utilized: Environmental consultant - 4 wheeled Level of assistance: SBA Comments: patient tends to rise from sit to stand pulling from the walker when in unlocked position  FUNCTIONAL TESTS:  TUG 1:09  several attempts to rise with UE assist 5x sit to stand with heavy UE use: 29.12 sec 2 min walk test with RW: 123 feet  BERG 24/56 = high risk of falls 100%  7/18; 4 min walk: 262 feet with RW 39.42 TUG no hands to rise 5x sit to stand 21.89 no hands holding 2# weight TODAY'S TREATMENT:      DATE: 08/03/22 Nustep x 5 min, level 5 (new model) Used music she enjoys Hessie Knows) to do seated simple UE/LE movements for dynamic warm up able to do 2 songs 1 song standing: marching, side to side taps, front taps while holding the walker Standing 4# bicep curls 8x right/left Sit to stand from mat table no hands 8x 4 MWT with RW Standing  tband rows  x 10 with red band Standing band shoulder extensions x10 with red band TUG 5x sit to stand re test Medium fatigue level   DATE:  08/01/22 Nustep x 6 min, level 4 (new model) Seated tband rows 2 x 10 with ]red band Seated 1# shoulder overhead press double 8x each side Seated 4# on knee over red line 10x right/left  Seated 4# bil bicep curls 10x Sit to stand 3 sets of 5: Ues used on armrests with reaching for low and medium targets upon rising Gait with NO RW; gait belt CGA 120 feet Medium fatigue level Likes Hessie Knows; Saw Emmit Pomfret in [place] (may incorporate music in future sessions to inspire movement) Gait with RW 60 feet with SBA                                                                                                                      DATE: 07/26/22 Nustep x 5 min, level 2 Gait training with walker timed for endurance: emphasizing upright posture (patient able to walk 265.5 feet before needing to rest) x 5 min Seated tband rows 2 x 10 with yellow band Seated bilateral shoulder ER with yellow band 2 x 10 Seated bilateral shoulder horizontal abduction 2 x 10 with yellow band Seated toe and heel raises x 20 Seated up and over hurdle  Seated march x 20 Seated LAQ x 10 each LE Seated clam with green loop 2 x 10  PATIENT EDUCATION: Education details: Educated patient on anatomy and physiology of current symptoms, prognosis, plan of care as well as initial self care strategies to promote recovery Person educated: Patient Education method: Explanation Education comprehension: verbalized understanding  HOME EXERCISE PROGRAM: Access Code: [email or web address] URL: [email or web address] Date: 07/17/2022 Prepared by: Mikey Kirschner  Exercises - Supine Hip Abduction  - 1 x daily - 7 x weekly - 2 sets - 10 reps - Supine Bridge  - 1 x daily - 7 x weekly - 2 sets - 10 reps - Hooklying  Clamshell with Resistance  - 1 x daily - 7 x weekly - 2 sets - 10 reps - Seated Hip Abduction with Resistance  - 1 x daily - 7 x weekly - 2 sets - 10 reps - Seated Long Arc Quad  - 1 x daily - 7 x weekly - 2 sets -  10 reps  GOALS: Goals reviewed with patient? Yes  SHORT TERM GOALS: Target date: 08/03/2022   The patient and husband will demonstrate knowledge of basic  exercises to promote mobility and strength for improved function Baseline: Goal status: met 7/16  2.  The patient will be able to walk for 4 minutes or 400 feet needed for short distance community ambulation Baseline:  Goal status: 4 min min 262 feet goal met 7/18  3.  The patient will have improved Timed Up and Go (TUG) time to    50 sec   sec indicating improved gait speed and LE strength  Baseline:  Goal status:met 7/18  4.  Able to rise sit to stand from standard height chair 1x without UE use Baseline:  Goal status: met 7/18     LONG TERM GOALS: Target date: 08/31/2022  The patient and husband will be independent in a safe self progression of a home exercise program to promote further recovery of function  Baseline:  Goal status: INITIAL  2.  The patient will have improved gait stamina and speed needed to ambulate 600 feet in 6 minutes  Baseline:  Goal status: INITIAL  3.  SPPB test improved to at least 7/12 indicating lower fall risk and decreased frailty Baseline:  Goal status: INITIAL  4.  Improved LE strength needed for standing to perform light housework (wiping counters, folding laundry) Baseline:  Goal status: INITIAL  5.  The patient will have an improved BERG balance score to   33 /56 indicating reduced risk of falls  Baseline:  Goal status: INITIAL  6. The patient will have improved gait stamina and speed needed to ambulate 600 feet in 6 minutes with RW INITIAL    ASSESSMENT:  CLINICAL IMPRESSION: The patient has made improvements in strength with sit to stand and now able to rise without UE assist.  5x sit to stand and TUG tests significantly improved from initial evaluation.  Responds well to simple verbal cues and demonstration for instructions.  Good response to music for longer duration  exercise/movement.   All STGs met.   OBJECTIVE IMPAIRMENTS: decreased activity tolerance, decreased balance, decreased mobility, difficulty walking, decreased strength, decreased safety awareness, impaired perceived functional ability, and pain.   ACTIVITY LIMITATIONS: carrying, lifting, bending, squatting, stairs, bathing, toileting, dressing, hygiene/grooming, and locomotion level  PARTICIPATION LIMITATIONS: meal prep, cleaning, laundry, shopping, and community activity  PERSONAL FACTORS: Time since onset of injury/illness/exacerbation and 3+ comorbidities: osteoporosis with previous fractures, falls, decreased cognition  are also affecting patient's functional outcome.   REHAB POTENTIAL: Good  CLINICAL DECISION MAKING: moderate  EVALUATION COMPLEXITY: moderate  PLAN:  PT FREQUENCY: 2x/week  PT DURATION: 8 weeks  PLANNED INTERVENTIONS: Therapeutic exercises, Therapeutic activity, Neuromuscular re-education, Balance training, Gait training, Patient/Family education, Self Care, Aquatic Therapy, Manual therapy, and Re-evaluation  PLAN FOR NEXT SESSION: nu-step, Progress simple seated ex's for warm up (add music);  sit to stand, progress to standing exercise; gait with RW or without assistive device with CGA  Lavinia Sharps, PT 08/03/22 1:23 PM Phone: [phone] Fax: [phone]   [facility] [address] [place], [place] [address] Phone #  [phone] Fax [phone]

## 2022-08-05 ENCOUNTER — Ambulatory Visit
Admission: RE | Admit: 2022-08-05 | Discharge: 2022-08-05 | Disposition: A | Discharge status: Home / Self Care | Payer: Medicare Other | Source: Ambulatory Visit | Attending: Physician Assistant | Admitting: Physician Assistant

## 2022-08-09 ENCOUNTER — Other Ambulatory Visit: Payer: Self-pay | Admitting: Family

## 2022-08-09 ENCOUNTER — Ambulatory Visit: Payer: Medicare Other | Admitting: Physical Therapy

## 2022-08-09 DIAGNOSIS — M6281 Muscle weakness (generalized): Secondary | ICD-10-CM | POA: Diagnosis not present

## 2022-08-09 DIAGNOSIS — R296 Repeated falls: Secondary | ICD-10-CM

## 2022-08-09 DIAGNOSIS — R262 Difficulty in walking, not elsewhere classified: Secondary | ICD-10-CM

## 2022-08-09 NOTE — Therapy (Signed)
OUTPATIENT PHYSICAL THERAPY NEURO TREATMENT   Patient Name: Caitlin [name] MRN: [id number] DOB:[birth year], 77 y.o., female Today's Date: 08/09/2022    REFERRING PROVIDER: Berniece Andreas MD  END OF SESSION:  PT End of Session - 08/09/22 1142     Visit Number 7    Date for PT Re-Evaluation 08/31/22    Authorization Type [employer]    PT Start Time 1145    PT Stop Time 1225    PT Time Calculation (min) 40 min    Activity Tolerance Patient tolerated treatment well             Past Medical History:  Diagnosis Date   Allergic rhinitis 07/11/2006   Back pain 10/09/2007   Blood transfusion without reported diagnosis    Diaphragmatic hernia 07/11/2006   Diverticulosis of colon    Eczema 07/11/2006   Elevated cholesterol 04/29/2012   Enteritis    Erythema nodosum    Tends to be seasonal has had a negative chest x-ray in the past negative PPD   Essential hypertension 07/11/2006   Fatigue 03/12/2007   Fibromyalgia    Gastroparesis 12/23/2008   Generalized anxiety disorder 09/01/2014   GERD (gastroesophageal reflux disease)    History of stricture and dilatation and Nissen surgery   Hearing loss, left ear 11/27/2008   History of cataract    bilateral; s/p surgery   Hyperglycemia 08/15/2006   Irritable bowel syndrome 12/23/2008   Leg cramps, nocturnal 11/11/2007   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   MCI (mild cognitive impairment) 10/22/2018   Obstructive sleep apnea 04/23/2007   uses CPAP nightly; mask has leak   Osteoarthritis 11/28/2011   Osteopenia    Prediabetes 04/19/2011   Vitamin B12 deficiency 05/13/2010   Past Surgical History:  Procedure Laterality Date   ABDOMINAL HYSTERECTOMY     bone spur     CATARACT EXTRACTION W/ INTRAOCULAR LENS  IMPLANT, BILATERAL     CESAREAN SECTION     x 1   CHOLECYSTECTOMY     COLONOSCOPY     DENTAL SURGERY     ESOPHAGOGASTRODUODENOSCOPY     stricture and dilatation   IR KYPHO EA ADDL LEVEL THORACIC OR LUMBAR   04/28/2022   IR KYPHO THORACIC WITH BONE BIOPSY  04/28/2022   IR RADIOLOGIST EVAL & MGMT  04/20/2022   IR RADIOLOGIST EVAL & MGMT  05/19/2022   left shoulder surgery  06/08, 07/12/17   x 2   MOUTH SURGERY Right 06/03/2019   NISSEN FUNDOPLICATION     ROTATOR CUFF REPAIR Left    rt knee surgery     scope   UPPER GASTROINTESTINAL ENDOSCOPY     Patient Active Problem List   Diagnosis Date Noted   Acute pain of right shoulder due to trauma 01/27/2022   Compression fracture of thoracolumbar vertebra, closed, initial encounter (HCC) 01/25/2022   Hypoxia 01/23/2022   Hypokalemia 01/23/2022   Fall with injury 01/23/2022   MCI (mild cognitive impairment) 10/22/2018   Generalized anxiety disorder 09/01/2014   Hair loss 08/12/2013   Erythema nodosum 06/05/2012   Elevated cholesterol 04/29/2012   Osteoarthritis 11/28/2011   GERD (gastroesophageal reflux disease)    Prediabetes 04/19/2011   Hypotension 06/07/2010   Vitamin B12 deficiency 05/13/2010   Gastroparesis 12/23/2008   Irritable bowel syndrome 12/23/2008   Hearing loss, left ear 11/27/2008   Leg cramps, nocturnal 11/11/2007   Back pain 10/09/2007   Obstructive sleep apnea 04/23/2007   Fatigue 03/12/2007   Diverticulosis of colon 12/10/2006  Fibromyalgia 12/10/2006   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   Hyperglycemia 08/15/2006   Essential hypertension 07/11/2006   Allergic rhinitis 07/11/2006   Diaphragmatic hernia 07/11/2006   Eczema 07/11/2006    ONSET DATE: January 2024  REFERRING DIAG: Berniece Andreas MD  THERAPY DIAG:  Muscle weakness (generalized)  Difficulty in walking, not elsewhere classified  Repeated falls  Rationale for Evaluation and Treatment: Rehabilitation  SUBJECTIVE:                                                                                                                                                                                             SUBJECTIVE STATEMENT: I can't reach into the  cabinets. [name] won't let me, he's afraid I'll fall.  Husband states that when Melissaann is stronger she will have a procedure to stretch her esophagus PERTINENT HISTORY:  Per neurology note 7/12: Alzheimer's suspected Mid to late August procedure to stretch esophagus Presumed osteoporosis secondary to multiple fractures including vertebral compression fractures in January and kyphoplasty in April IBS osteoporosis Husband is [name] PAIN:  PAIN:  Are you having pain? yes NPRS scale: "a little"/10 Pain location: lower back, mid back later in the day Aggravating factors: later in the day legs give out  Relieving factors: Ok standing  PRECAUTIONS: Fall  WEIGHT BEARING RESTRICTIONS: No  FALLS: Has patient fallen in last 6 months? Yes. Number of falls 1  LIVING ENVIRONMENT: Lives with: lives in senior friendly community handicap accessible Lives in: House/apartment Stairs: No Has following equipment at home: Walker - 4 wheeled  PLOF: prior to kyphoplasty surgery, patient's husband states she was able to clean their home  PATIENT GOALS: walking, loves to clean house, be able to pick up trash from floor; be able to do laundry  OBJECTIVE:    COGNITION: Decreased ability to follow multi step directions;  needs demonstration, repetition and simple verbal cues   Short Physical Performance Battery:    Balance:       Side by side stance:  1  points (1)  Semi -tandem:   0  points (1)  Tandem:   0   points (2)    Gait Speed: (13.1 feet) done 2x take the best time: 1     points  13.40 sec                                           > 8.70 sec=1 pt  6.21- 8.70 sec=2 pt                                           4.82-6.20 sec=3 pt                                            < 4.82 = 4 pts    Repeated Chair Stands:   0   points  patient requires UE use to rise   (timer stopped when straight on 5th stand)                                                If  used arms=0 pts                                              > 60 sec=0 pts                                              16.70 or more=1 pt                                               13.70-16.69=2 pts                                               11.20-13.69=3 pts                                               11.9 sec or less=4 pts Total score= 2     points <10/12 predictive of 1 or more mobility limitations and increased risk of mobility disability 6 or less/12 associated with a higher fall rate                                                   UPPER AND LOWER EXTREMITY ROM:   WFLS   LOWER EXTREMITY MMT:  Unable to rise from the chair without UE support  MMT Right Eval Left Eval  Hip flexion 3+ 3+  Hip extension    Hip abduction 3+ 3+  Hip adduction    Hip internal rotation    Hip external rotation    Knee flexion 4 4  Knee extension 3+ 3+  Ankle dorsiflexion    Ankle plantarflexion 3+ 3+  Ankle inversion    Ankle eversion    (Blank rows = not tested)  GAIT: Assistive device utilized: Environmental consultant - 4 wheeled Level of assistance: SBA Comments: patient tends to rise from sit to stand pulling from the walker when in unlocked position  FUNCTIONAL TESTS:  TUG 1:09  several attempts to rise with UE assist 5x sit to stand with heavy UE use: 29.12 sec 2 min walk test with RW: 123 feet  BERG 24/56 = high risk of falls 100%  7/18; 4 min walk: 262 feet with RW 39.42 TUG no hands to rise 5x sit to stand 21.89 no hands holding 2# weight TODAY'S TREATMENT:     DATE: 08/09/22 Cancer rehab gym for a quieter environment (easily distracted) Nustep x 5 min, level 5 (new model) Used music she enjoys Hessie Knows) to do seated simple UE/LE movements for dynamic warm up 1 song Standing 3 color circles on floor toe taps 15x Standing 3 color circles at knee and hip height UE taps 12x Seated 2# overhead press 8x right/left Sit to stand from mat table holding 2# dumbbells in each hand  8x Seated with green band around back, pt holds ends of band on walker handles and press walker away 10x Standing  red tband rows  x 10  Gait without RW with CGA with gait belt 200 feet (shorter steps and a little shuffling, decreased gait speed) Gait with RW 200 feet (similar gait speed as without RW)  DATE: 08/03/22 Nustep x 5 min, level 5 (new model) Used music she enjoys Hessie Knows) to do seated simple UE/LE movements for dynamic warm up able to do 2 songs 1 song standing: marching, side to side taps, front taps while holding the walker Standing 4# bicep curls 8x right/left Sit to stand from mat table no hands 8x 4 MWT with RW Standing  tband rows  x 10 with red band Standing band shoulder extensions x10 with red band TUG 5x sit to stand re test Medium fatigue level   DATE: 08/01/22 Nustep x 6 min, level 4 (new model) Seated tband rows 2 x 10 with ]red band Seated 1# shoulder overhead press double 8x each side Seated 4# on knee over red line 10x right/left  Seated 4# bil bicep curls 10x Sit to stand 3 sets of 5: Ues used on armrests with reaching for low and medium targets upon rising Gait with NO RW; gait belt CGA 120 feet Medium fatigue level Likes Hessie Knows; Saw Emmit Pomfret in [place] (may incorporate music in future sessions to inspire movement) Gait with RW 60 feet with SBA                                                                                                                      DATE: 07/26/22 Nustep x 5 min, level 2 Gait training with walker timed for endurance: emphasizing upright posture (patient able to walk 265.5 feet before needing to rest) x 5 min Seated tband rows 2 x 10 with yellow band Seated bilateral shoulder ER with yellow band 2 x 10 Seated  bilateral shoulder horizontal abduction 2 x 10 with yellow band Seated toe and heel raises x 20 Seated up and over hurdle  Seated march x 20 Seated LAQ x 10 each LE Seated clam with green loop 2 x  10  PATIENT EDUCATION: Education details: Educated patient on anatomy and physiology of current symptoms, prognosis, plan of care as well as initial self care strategies to promote recovery Person educated: Patient Education method: Explanation Education comprehension: verbalized understanding  HOME EXERCISE PROGRAM: Access Code: [email or web address] URL: [email or web address] Date: 07/17/2022 Prepared by: Mikey Kirschner  Exercises - Supine Hip Abduction  - 1 x daily - 7 x weekly - 2 sets - 10 reps - Supine Bridge  - 1 x daily - 7 x weekly - 2 sets - 10 reps - Hooklying Clamshell with Resistance  - 1 x daily - 7 x weekly - 2 sets - 10 reps - Seated Hip Abduction with Resistance  - 1 x daily - 7 x weekly - 2 sets - 10 reps - Seated Long Arc Quad  - 1 x daily - 7 x weekly - 2 sets - 10 reps  GOALS: Goals reviewed with patient? Yes  SHORT TERM GOALS: Target date: 08/03/2022   The patient and husband will demonstrate knowledge of basic  exercises to promote mobility and strength for improved function Baseline: Goal status: met 7/16  2.  The patient will be able to walk for 4 minutes or 400 feet needed for short distance community ambulation Baseline:  Goal status: 4 min min 262 feet goal met 7/18  3.  The patient will have improved Timed Up and Go (TUG) time to    50 sec   sec indicating improved gait speed and LE strength  Baseline:  Goal status:met 7/18  4.  Able to rise sit to stand from standard height chair 1x without UE use Baseline:  Goal status: met 7/18     LONG TERM GOALS: Target date: 08/31/2022  The patient and husband will be independent in a safe self progression of a home exercise program to promote further recovery of function  Baseline:  Goal status: INITIAL  2.  The patient will have improved gait stamina and speed needed to ambulate 600 feet in 6 minutes  Baseline:  Goal status: INITIAL  3.  SPPB test improved to at least 7/12 indicating lower  fall risk and decreased frailty Baseline:  Goal status: INITIAL  4.  Improved LE strength needed for standing to perform light housework (wiping counters, folding laundry) Baseline:  Goal status: INITIAL  5.  The patient will have an improved BERG balance score to   33 /56 indicating reduced risk of falls  Baseline:  Goal status: INITIAL  6. The patient will have improved gait stamina and speed needed to ambulate 600 feet in 6 minutes with RW INITIAL    ASSESSMENT:  CLINICAL IMPRESSION: No carryover from previous sessions secondary to cognitive impairment.  She responds well to simple, clear cut directions and demonstration of the movement requested.  Less UE use needed with sit to stand and can rise without using arms to assist if requested.  CGA for safety with gait without RW but no loss of balance or physical assist needed from therapist.    OBJECTIVE IMPAIRMENTS: decreased activity tolerance, decreased balance, decreased mobility, difficulty walking, decreased strength, decreased safety awareness, impaired perceived functional ability, and pain.   ACTIVITY LIMITATIONS: carrying, lifting, bending, squatting, stairs, bathing, toileting, dressing, hygiene/grooming, and locomotion level  PARTICIPATION LIMITATIONS: meal prep, cleaning, laundry, shopping, and community activity  PERSONAL FACTORS: Time since onset of injury/illness/exacerbation and 3+ comorbidities: osteoporosis with previous fractures, falls, decreased cognition  are also affecting patient's functional outcome.   REHAB POTENTIAL: Good  CLINICAL DECISION MAKING: moderate  EVALUATION COMPLEXITY: moderate  PLAN:  PT FREQUENCY: 2x/week  PT DURATION: 8 weeks  PLANNED INTERVENTIONS: Therapeutic exercises, Therapeutic activity, Neuromuscular re-education, Balance training, Gait training, Patient/Family education, Self Care, Aquatic Therapy, Manual therapy, and Re-evaluation  PLAN FOR NEXT SESSION: nu-step,  Progress simple seated ex's for warm up;  sit to stand, standing exercise; gait with RW or without assistive device with CGA; practice reaching into cabinets for cones  Lavinia Sharps, PT 08/09/22 12:25 PM Phone: [phone] Fax: [phone]   [facility] [address], [address] [place], [place] [address] Phone # [phone] Fax [phone]

## 2022-08-10 ENCOUNTER — Other Ambulatory Visit: Payer: Medicare Other

## 2022-08-10 ENCOUNTER — Other Ambulatory Visit: Payer: Self-pay | Admitting: Gastroenterology

## 2022-08-15 ENCOUNTER — Ambulatory Visit: Payer: Medicare Other

## 2022-08-15 DIAGNOSIS — M6281 Muscle weakness (generalized): Secondary | ICD-10-CM | POA: Diagnosis not present

## 2022-08-15 DIAGNOSIS — R262 Difficulty in walking, not elsewhere classified: Secondary | ICD-10-CM

## 2022-08-15 DIAGNOSIS — R293 Abnormal posture: Secondary | ICD-10-CM

## 2022-08-15 DIAGNOSIS — R296 Repeated falls: Secondary | ICD-10-CM

## 2022-08-15 NOTE — Telephone Encounter (Signed)
Pt has 4 pills left °

## 2022-08-15 NOTE — Therapy (Signed)
OUTPATIENT PHYSICAL THERAPY NEURO TREATMENT   Patient Name: Caitlin [name] MRN: [id number] DOB:1945/06/26, 77 y.o., female Today's Date: 08/15/2022    REFERRING PROVIDER: Berniece Andreas MD  END OF SESSION:  PT End of Session - 08/15/22 1245     Visit Number 8    Date for PT Re-Evaluation 08/31/22    Authorization Type [employer] Medicare    Progress Note Due on Visit 10    PT Start Time 1148    PT Stop Time 1229    PT Time Calculation (min) 41 min    Activity Tolerance Patient tolerated treatment well    Behavior During Therapy [facility] for tasks assessed/performed              Past Medical History:  Diagnosis Date   Allergic rhinitis 07/11/2006   Back pain 10/09/2007   Blood transfusion without reported diagnosis    Diaphragmatic hernia 07/11/2006   Diverticulosis of colon    Eczema 07/11/2006   Elevated cholesterol 04/29/2012   Enteritis    Erythema nodosum    Tends to be seasonal has had a negative chest x-ray in the past negative PPD   Essential hypertension 07/11/2006   Fatigue 03/12/2007   Fibromyalgia    Gastroparesis 12/23/2008   Generalized anxiety disorder 09/01/2014   GERD (gastroesophageal reflux disease)    History of stricture and dilatation and Nissen surgery   Hearing loss, left ear 11/27/2008   History of cataract    bilateral; s/p surgery   Hyperglycemia 08/15/2006   Irritable bowel syndrome 12/23/2008   Leg cramps, nocturnal 11/11/2007   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   MCI (mild cognitive impairment) 10/22/2018   Obstructive sleep apnea 04/23/2007   uses CPAP nightly; mask has leak   Osteoarthritis 11/28/2011   Osteopenia    Prediabetes 04/19/2011   Vitamin B12 deficiency 05/13/2010   Past Surgical History:  Procedure Laterality Date   ABDOMINAL HYSTERECTOMY     bone spur     CATARACT EXTRACTION W/ INTRAOCULAR LENS  IMPLANT, BILATERAL     CESAREAN SECTION     x 1   CHOLECYSTECTOMY     COLONOSCOPY     DENTAL SURGERY      ESOPHAGOGASTRODUODENOSCOPY     stricture and dilatation   IR KYPHO EA ADDL LEVEL THORACIC OR LUMBAR  04/28/2022   IR KYPHO THORACIC WITH BONE BIOPSY  04/28/2022   IR RADIOLOGIST EVAL & MGMT  04/20/2022   IR RADIOLOGIST EVAL & MGMT  05/19/2022   left shoulder surgery  06/08, 07/12/17   x 2   MOUTH SURGERY Right 06/03/2019   NISSEN FUNDOPLICATION     ROTATOR CUFF REPAIR Left    rt knee surgery     scope   UPPER GASTROINTESTINAL ENDOSCOPY     Patient Active Problem List   Diagnosis Date Noted   Acute pain of right shoulder due to trauma 01/27/2022   Compression fracture of thoracolumbar vertebra, closed, initial encounter (HCC) 01/25/2022   Hypoxia 01/23/2022   Hypokalemia 01/23/2022   Fall with injury 01/23/2022   MCI (mild cognitive impairment) 10/22/2018   Generalized anxiety disorder 09/01/2014   Hair loss 08/12/2013   Erythema nodosum 06/05/2012   Elevated cholesterol 04/29/2012   Osteoarthritis 11/28/2011   GERD (gastroesophageal reflux disease)    Prediabetes 04/19/2011   Hypotension 06/07/2010   Vitamin B12 deficiency 05/13/2010   Gastroparesis 12/23/2008   Irritable bowel syndrome 12/23/2008   Hearing loss, left ear 11/27/2008   Leg cramps, nocturnal 11/11/2007   Back  pain 10/09/2007   Obstructive sleep apnea 04/23/2007   Fatigue 03/12/2007   Diverticulosis of colon 12/10/2006   Fibromyalgia 12/10/2006   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   Hyperglycemia 08/15/2006   Essential hypertension 07/11/2006   Allergic rhinitis 07/11/2006   Diaphragmatic hernia 07/11/2006   Eczema 07/11/2006    ONSET DATE: January 2024  REFERRING DIAG: Berniece Andreas MD  THERAPY DIAG:  Muscle weakness (generalized)  Difficulty in walking, not elsewhere classified  Repeated falls  Abnormal posture  Rationale for Evaluation and Treatment: Rehabilitation  SUBJECTIVE:                                                                                                                                                                                              SUBJECTIVE STATEMENT: Pt's husband reports that she has been walking at home without the walker without difficulty PERTINENT HISTORY:  Per neurology note 7/12: Alzheimer's suspected Mid to late August procedure to stretch esophagus Presumed osteoporosis secondary to multiple fractures including vertebral compression fractures in January and kyphoplasty in April IBS osteoporosis Husband is [name] PAIN:  PAIN:  Are you having pain? yes NPRS scale: 2/10 Pain location: Rt hip Aggravating factors: later in the day legs give out  Relieving factors: Ok standing  PRECAUTIONS: Fall  WEIGHT BEARING RESTRICTIONS: No  FALLS: Has patient fallen in last 6 months? Yes. Number of falls 1  LIVING ENVIRONMENT: Lives with: lives in senior friendly community handicap accessible Lives in: House/apartment Stairs: No Has following equipment at home: Walker - 4 wheeled  PLOF: prior to kyphoplasty surgery, patient's husband states she was able to clean their home  PATIENT GOALS: walking, loves to clean house, be able to pick up trash from floor; be able to do laundry  OBJECTIVE:    COGNITION: Decreased ability to follow multi step directions;  needs demonstration, repetition and simple verbal cues   Short Physical Performance Battery:    Balance:       Side by side stance:  1  points (1)  Semi -tandem:   0  points (1)  Tandem:   0   points (2)    Gait Speed: (13.1 feet) done 2x take the best time: 1     points  13.40 sec                                           > 8.70 sec=1 pt  6.21- 8.70 sec=2 pt                                           4.82-6.20 sec=3 pt                                            < 4.82 = 4 pts    Repeated Chair Stands:   0   points  patient requires UE use to rise   (timer stopped when straight on 5th stand)                                                If  used arms=0 pts                                              > 60 sec=0 pts                                              16.70 or more=1 pt                                               13.70-16.69=2 pts                                               11.20-13.69=3 pts                                               11.9 sec or less=4 pts Total score= 2     points <10/12 predictive of 1 or more mobility limitations and increased risk of mobility disability 6 or less/12 associated with a higher fall rate                                                   UPPER AND LOWER EXTREMITY ROM:   WFLS   LOWER EXTREMITY MMT:  Unable to rise from the chair without UE support  MMT Right Eval Left Eval  Hip flexion 3+ 3+  Hip extension    Hip abduction 3+ 3+  Hip adduction    Hip internal rotation    Hip external rotation    Knee flexion 4 4  Knee extension 3+ 3+  Ankle dorsiflexion    Ankle plantarflexion 3+ 3+  Ankle inversion    Ankle eversion    (Blank rows = not tested)  GAIT: Assistive device utilized: Environmental consultant - 4 wheeled Level of assistance: SBA Comments: patient tends to rise from sit to stand pulling from the walker when in unlocked position  FUNCTIONAL TESTS:  TUG 1:09  several attempts to rise with UE assist 5x sit to stand with heavy UE use: 29.12 sec 2 min walk test with RW: 123 feet  BERG 24/56 = high risk of falls 100%  7/18; 4 min walk: 262 feet with RW 39.42 TUG no hands to rise 5x sit to stand 21.89 no hands holding 2# weight TODAY'S TREATMENT:     DATE: 08/15/22 Nustep x 6 min, level 2 (old model)x 6 minutes Box step around colored circles on the floor- hand held assistance by PT for safety Standing 3 color circles at knee and hip height UE taps 12x Seated 2# overhead press 2x10 right/left alternating Sit to stand from mat table x10- use of arms.  Not able to do holding weights today Alternating step taps on 4" step x10 with UE support, x10 without UE support and  PT CGA on gait belt Standing  red tband rows  x 10  Gait without RW with CGA with gait belt 200 x4 min (verbal cues for step length and to reduce shuffling) Hurdles next to barre: step to over hurdles with CGA assistance by PT on gait belt DATE: 08/09/22 Cancer rehab gym for a quieter environment (easily distracted) Nustep x 5 min, level 5 (new model) Used music she enjoys Hessie Knows) to do seated simple UE/LE movements for dynamic warm up 1 song Standing 3 color circles on floor toe taps 15x Standing 3 color circles at knee and hip height UE taps 12x Seated 2# overhead press 8x right/left Sit to stand from mat table holding 2# dumbbells in each hand 8x Seated with green band around back, pt holds ends of band on walker handles and press walker away 10x Standing  red tband rows  x 10  Gait without RW with CGA with gait belt 200 feet (shorter steps and a little shuffling, decreased gait speed) Gait with RW 200 feet (similar gait speed as without RW)  DATE: 08/03/22 Nustep x 5 min, level 5 (new model) Used music she enjoys Hessie Knows) to do seated simple UE/LE movements for dynamic warm up able to do 2 songs 1 song standing: marching, side to side taps, front taps while holding the walker Standing 4# bicep curls 8x right/left Sit to stand from mat table no hands 8x 4 MWT with RW Standing  tband rows  x 10 with red band Standing band shoulder extensions x10 with red band TUG 5x sit to stand re test Medium fatigue level  PATIENT EDUCATION: Education details: Educated patient on anatomy and physiology of current symptoms, prognosis, plan of care as well as initial self care strategies to promote recovery Person educated: Patient Education method: Explanation Education comprehension: verbalized understanding  HOME EXERCISE PROGRAM: Access Code: [email or web address] URL: [email or web address] Date: 07/17/2022 Prepared by: Mikey Kirschner  Exercises - Supine Hip Abduction  -  1 x daily - 7 x weekly - 2 sets - 10 reps - Supine Bridge  - 1 x daily - 7 x weekly - 2 sets - 10 reps - Hooklying Clamshell with Resistance  - 1 x daily - 7 x weekly - 2 sets - 10 reps - Seated Hip Abduction with Resistance  - 1 x daily - 7 x weekly - 2 sets - 10 reps - Seated Long Arc Quad  -  1 x daily - 7 x weekly - 2 sets - 10 reps  GOALS: Goals reviewed with patient? Yes  SHORT TERM GOALS: Target date: 08/03/2022   The patient and husband will demonstrate knowledge of basic  exercises to promote mobility and strength for improved function Baseline: Goal status: met 7/16  2.  The patient will be able to walk for 4 minutes or 400 feet needed for short distance community ambulation Baseline:  Goal status: 4 min min 262 feet goal met 7/18  3.  The patient will have improved Timed Up and Go (TUG) time to    50 sec   sec indicating improved gait speed and LE strength  Baseline:  Goal status:met 7/18  4.  Able to rise sit to stand from standard height chair 1x without UE use Baseline:  Goal status: met 7/18     LONG TERM GOALS: Target date: 08/31/2022  The patient and husband will be independent in a safe self progression of a home exercise program to promote further recovery of function  Baseline:  Goal status: INITIAL  2.  The patient will have improved gait stamina and speed needed to ambulate 600 feet in 6 minutes  Baseline:  Goal status: INITIAL  3.  SPPB test improved to at least 7/12 indicating lower fall risk and decreased frailty Baseline:  Goal status: INITIAL  4.  Improved LE strength needed for standing to perform light housework (wiping counters, folding laundry) Baseline:  Goal status: INITIAL  5.  The patient will have an improved BERG balance score to   33 /56 indicating reduced risk of falls  Baseline:  Goal status: INITIAL  6. The patient will have improved gait stamina and speed needed to ambulate 600 feet in 6 minutes with  RW INITIAL    ASSESSMENT:  CLINICAL IMPRESSION: Pt continues to work on balance and endurance in the clinic.  She is walking at home without the walker per husband report and doing well.  She required verbal cues for step length with gait in the clinic and max verbal cues for sit to stand transition with safety.  PT monitored throughout session.  Patient will benefit from skilled PT to address the below impairments and improve overall function.   OBJECTIVE IMPAIRMENTS: decreased activity tolerance, decreased balance, decreased mobility, difficulty walking, decreased strength, decreased safety awareness, impaired perceived functional ability, and pain.   ACTIVITY LIMITATIONS: carrying, lifting, bending, squatting, stairs, bathing, toileting, dressing, hygiene/grooming, and locomotion level  PARTICIPATION LIMITATIONS: meal prep, cleaning, laundry, shopping, and community activity  PERSONAL FACTORS: Time since onset of injury/illness/exacerbation and 3+ comorbidities: osteoporosis with previous fractures, falls, decreased cognition  are also affecting patient's functional outcome.   REHAB POTENTIAL: Good  CLINICAL DECISION MAKING: moderate  EVALUATION COMPLEXITY: moderate  PLAN:  PT FREQUENCY: 2x/week  PT DURATION: 8 weeks  PLANNED INTERVENTIONS: Therapeutic exercises, Therapeutic activity, Neuromuscular re-education, Balance training, Gait training, Patient/Family education, Self Care, Aquatic Therapy, Manual therapy, and Re-evaluation  PLAN FOR NEXT SESSION: nu-step, Progress simple seated ex's for warm up;  sit to stand, standing exercise; gait with RW or without assistive device with CGA; practice reaching into cabinets for cones  Lorrene Reid, PT 08/15/22 1:14 PM   [facility] [address] [place], [place] [address] Phone # [phone] Fax [phone]

## 2022-08-17 ENCOUNTER — Encounter: Payer: Self-pay | Admitting: Physician Assistant

## 2022-08-18 NOTE — Telephone Encounter (Signed)
I believe that there is no PDR on her, so I cannot discuss any findings, please confirm, thanks

## 2022-08-21 ENCOUNTER — Ambulatory Visit: Payer: Medicare Other | Attending: Internal Medicine | Admitting: Physical Therapy

## 2022-08-21 DIAGNOSIS — R252 Cramp and spasm: Secondary | ICD-10-CM | POA: Insufficient documentation

## 2022-08-21 DIAGNOSIS — M6281 Muscle weakness (generalized): Secondary | ICD-10-CM | POA: Diagnosis not present

## 2022-08-21 DIAGNOSIS — R262 Difficulty in walking, not elsewhere classified: Secondary | ICD-10-CM | POA: Diagnosis present

## 2022-08-21 DIAGNOSIS — R296 Repeated falls: Secondary | ICD-10-CM | POA: Diagnosis present

## 2022-08-21 DIAGNOSIS — M5442 Lumbago with sciatica, left side: Secondary | ICD-10-CM | POA: Insufficient documentation

## 2022-08-21 DIAGNOSIS — G8929 Other chronic pain: Secondary | ICD-10-CM | POA: Diagnosis present

## 2022-08-21 DIAGNOSIS — R293 Abnormal posture: Secondary | ICD-10-CM | POA: Diagnosis present

## 2022-08-21 NOTE — Therapy (Signed)
OUTPATIENT PHYSICAL THERAPY NEURO TREATMENT   Patient Name: Caitlin [name] MRN: [id number] DOB:06/28/1945, 77 y.o., female Today's Date: 08/21/2022    REFERRING PROVIDER: Berniece Andreas MD  END OF SESSION:  PT End of Session - 08/21/22 1230     Visit Number 9    Date for PT Re-Evaluation 08/31/22    Authorization Type [employer]    Progress Note Due on Visit 10    PT Start Time 1230    PT Stop Time 1315    PT Time Calculation (min) 45 min    Activity Tolerance Patient tolerated treatment well    Behavior During Therapy [facility] for tasks assessed/performed              Past Medical History:  Diagnosis Date   Allergic rhinitis 07/11/2006   Back pain 10/09/2007   Blood transfusion without reported diagnosis    Diaphragmatic hernia 07/11/2006   Diverticulosis of colon    Eczema 07/11/2006   Elevated cholesterol 04/29/2012   Enteritis    Erythema nodosum    Tends to be seasonal has had a negative chest x-ray in the past negative PPD   Essential hypertension 07/11/2006   Fatigue 03/12/2007   Fibromyalgia    Gastroparesis 12/23/2008   Generalized anxiety disorder 09/01/2014   GERD (gastroesophageal reflux disease)    History of stricture and dilatation and Nissen surgery   Hearing loss, left ear 11/27/2008   History of cataract    bilateral; s/p surgery   Hyperglycemia 08/15/2006   Irritable bowel syndrome 12/23/2008   Leg cramps, nocturnal 11/11/2007   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   MCI (mild cognitive impairment) 10/22/2018   Obstructive sleep apnea 04/23/2007   uses CPAP nightly; mask has leak   Osteoarthritis 11/28/2011   Osteopenia    Prediabetes 04/19/2011   Vitamin B12 deficiency 05/13/2010   Past Surgical History:  Procedure Laterality Date   ABDOMINAL HYSTERECTOMY     bone spur     CATARACT EXTRACTION W/ INTRAOCULAR LENS  IMPLANT, BILATERAL     CESAREAN SECTION     x 1   CHOLECYSTECTOMY     COLONOSCOPY     DENTAL SURGERY      ESOPHAGOGASTRODUODENOSCOPY     stricture and dilatation   IR KYPHO EA ADDL LEVEL THORACIC OR LUMBAR  04/28/2022   IR KYPHO THORACIC WITH BONE BIOPSY  04/28/2022   IR RADIOLOGIST EVAL & MGMT  04/20/2022   IR RADIOLOGIST EVAL & MGMT  05/19/2022   left shoulder surgery  06/08, 07/12/17   x 2   MOUTH SURGERY Right 06/03/2019   NISSEN FUNDOPLICATION     ROTATOR CUFF REPAIR Left    rt knee surgery     scope   UPPER GASTROINTESTINAL ENDOSCOPY     Patient Active Problem List   Diagnosis Date Noted   Acute pain of right shoulder due to trauma 01/27/2022   Compression fracture of thoracolumbar vertebra, closed, initial encounter (HCC) 01/25/2022   Hypoxia 01/23/2022   Hypokalemia 01/23/2022   Fall with injury 01/23/2022   MCI (mild cognitive impairment) 10/22/2018   Generalized anxiety disorder 09/01/2014   Hair loss 08/12/2013   Erythema nodosum 06/05/2012   Elevated cholesterol 04/29/2012   Osteoarthritis 11/28/2011   GERD (gastroesophageal reflux disease)    Prediabetes 04/19/2011   Hypotension 06/07/2010   Vitamin B12 deficiency 05/13/2010   Gastroparesis 12/23/2008   Irritable bowel syndrome 12/23/2008   Hearing loss, left ear 11/27/2008   Leg cramps, nocturnal 11/11/2007   Back  pain 10/09/2007   Obstructive sleep apnea 04/23/2007   Fatigue 03/12/2007   Diverticulosis of colon 12/10/2006   Fibromyalgia 12/10/2006   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   Hyperglycemia 08/15/2006   Essential hypertension 07/11/2006   Allergic rhinitis 07/11/2006   Diaphragmatic hernia 07/11/2006   Eczema 07/11/2006    ONSET DATE: January 2024  REFERRING DIAG: Berniece Andreas MD  THERAPY DIAG:  Muscle weakness (generalized)  Difficulty in walking, not elsewhere classified  Repeated falls  Abnormal posture  Rationale for Evaluation and Treatment: Rehabilitation  SUBJECTIVE:                                                                                                                                                                                              SUBJECTIVE STATEMENT: Pt reports nothing new or different. States she has been trying to exercise at home. No falls.  PERTINENT HISTORY:  Per neurology note 7/12: Alzheimer's suspected Mid to late August procedure to stretch esophagus Presumed osteoporosis secondary to multiple fractures including vertebral compression fractures in January and kyphoplasty in April IBS osteoporosis Husband is [name] PAIN:  Are you having pain? NO  PRECAUTIONS: Fall  WEIGHT BEARING RESTRICTIONS: No  FALLS: Has patient fallen in last 6 months? Yes. Number of falls 1  LIVING ENVIRONMENT: Lives with: lives in senior friendly community handicap accessible Lives in: House/apartment Stairs: No Has following equipment at home: Walker - 4 wheeled  PLOF: prior to kyphoplasty surgery, patient's husband states she was able to clean their home  PATIENT GOALS: walking, loves to clean house, be able to pick up trash from floor; be able to do laundry  OBJECTIVE:    COGNITION: Decreased ability to follow multi step directions;  needs demonstration, repetition and simple verbal cues   Short Physical Performance Battery:    Balance:       Side by side stance:  1  points (1)  Semi -tandem:   0  points (1)  Tandem:   0   points (2)    Gait Speed: (13.1 feet) done 2x take the best time: 1     points  13.40 sec                                           > 8.70 sec=1 pt  6.21- 8.70 sec=2 pt                                           4.82-6.20 sec=3 pt                                            < 4.82 = 4 pts    Repeated Chair Stands:   0   points  patient requires UE use to rise   (timer stopped when straight on 5th stand)                                                If used arms=0 pts                                              > 60 sec=0 pts                                              16.70 or  more=1 pt                                               13.70-16.69=2 pts                                               11.20-13.69=3 pts                                               11.9 sec or less=4 pts Total score= 2     points <10/12 predictive of 1 or more mobility limitations and increased risk of mobility disability 6 or less/12 associated with a higher fall rate                                                   UPPER AND LOWER EXTREMITY ROM:   WFLS   LOWER EXTREMITY MMT:  Unable to rise from the chair without UE support  MMT Right Eval Left Eval  Hip flexion 3+ 3+  Hip extension    Hip abduction 3+ 3+  Hip adduction    Hip internal rotation    Hip external rotation    Knee flexion 4 4  Knee extension 3+ 3+  Ankle dorsiflexion    Ankle plantarflexion 3+ 3+  Ankle inversion    Ankle eversion    (Blank rows = not tested)  GAIT: Assistive device utilized: Environmental consultant - 4 wheeled Level of assistance: SBA Comments: patient tends to rise from sit to stand pulling from the walker when in unlocked position  FUNCTIONAL TESTS:  TUG 1:09  several attempts to rise with UE assist 5x sit to stand with heavy UE use: 29.12 sec 2 min walk test with RW: 123 feet  BERG 24/56 = high risk of falls 100%  7/18; 4 min walk: 262 feet with RW 39.42 TUG no hands to rise 5x sit to stand 21.89 no hands holding 2# weight TODAY'S TREATMENT:     DATE: 08/21/22 Nustep x 6 min, level 5  Box step around colored circles on the floor- hand held assistance by PT for safety Sit to stand from mat table no hands x10 Standing UE elevated marching 2x10 Multidirectional stepping on colored dots  Forward step over pool noodles (small) Side step over pool noodle Gait with RW CGA working on gait speed, increasing step length with multiple v/cs  DATE: 08/15/22 Nustep x 6 min, level 2 (old model)x 6 minutes Box step around colored circles on the floor- hand held assistance by PT for safety Standing 3  color circles at knee and hip height UE taps 12x Seated 2# overhead press 2x10 right/left alternating Sit to stand from mat table x10- use of arms.  Not able to do holding weights today Alternating step taps on 4" step x10 with UE support, x10 without UE support and PT CGA on gait belt Standing  red tband rows  x 10  Gait without RW with CGA with gait belt 200 x4 min (verbal cues for step length and to reduce shuffling) Hurdles next to barre: step to over hurdles with CGA assistance by PT on gait belt DATE: 08/09/22 Cancer rehab gym for a quieter environment (easily distracted) Nustep x 5 min, level 5 (new model) Used music she enjoys Hessie Knows) to do seated simple UE/LE movements for dynamic warm up 1 song Standing 3 color circles on floor toe taps 15x Standing 3 color circles at knee and hip height UE taps 12x Seated 2# overhead press 8x right/left Sit to stand from mat table holding 2# dumbbells in each hand 8x Seated with green band around back, pt holds ends of band on walker handles and press walker away 10x Standing  red tband rows  x 10  Gait without RW with CGA with gait belt 200 feet (shorter steps and a little shuffling, decreased gait speed) Gait with RW 200 feet (similar gait speed as without RW)  DATE: 08/03/22 Nustep x 5 min, level 5 (new model) Used music she enjoys Hessie Knows) to do seated simple UE/LE movements for dynamic warm up able to do 2 songs 1 song standing: marching, side to side taps, front taps while holding the walker Standing 4# bicep curls 8x right/left Sit to stand from mat table no hands 8x 4 MWT with RW Standing  tband rows  x 10 with red band Standing band shoulder extensions x10 with red band TUG 5x sit to stand re test Medium fatigue level  PATIENT EDUCATION: Education details: Educated patient on anatomy and physiology of current symptoms, prognosis, plan of care as well as initial self care strategies to promote recovery Person educated:  Patient Education method: Explanation Education comprehension: verbalized understanding  HOME EXERCISE PROGRAM: Access Code: [email or web address] URL: [email or web address] Date: 07/17/2022 Prepared by: Mikey Kirschner  Exercises - Supine Hip Abduction  - 1 x daily - 7 x weekly - 2  sets - 10 reps - Supine Bridge  - 1 x daily - 7 x weekly - 2 sets - 10 reps - Hooklying Clamshell with Resistance  - 1 x daily - 7 x weekly - 2 sets - 10 reps - Seated Hip Abduction with Resistance  - 1 x daily - 7 x weekly - 2 sets - 10 reps - Seated Long Arc Quad  - 1 x daily - 7 x weekly - 2 sets - 10 reps  GOALS: Goals reviewed with patient? Yes  SHORT TERM GOALS: Target date: 08/03/2022   The patient and husband will demonstrate knowledge of basic  exercises to promote mobility and strength for improved function Baseline: Goal status: met 7/16  2.  The patient will be able to walk for 4 minutes or 400 feet needed for short distance community ambulation Baseline:  Goal status: 4 min min 262 feet goal met 7/18  3.  The patient will have improved Timed Up and Go (TUG) time to    50 sec   sec indicating improved gait speed and LE strength  Baseline:  Goal status:met 7/18  4.  Able to rise sit to stand from standard height chair 1x without UE use Baseline:  Goal status: met 7/18     LONG TERM GOALS: Target date: 08/31/2022  The patient and husband will be independent in a safe self progression of a home exercise program to promote further recovery of function  Baseline:  Goal status: INITIAL  2.  The patient will have improved gait stamina and speed needed to ambulate 600 feet in 6 minutes  Baseline:  Goal status: INITIAL  3.  SPPB test improved to at least 7/12 indicating lower fall risk and decreased frailty Baseline:  Goal status: INITIAL  4.  Improved LE strength needed for standing to perform light housework (wiping counters, folding laundry) Baseline:  Goal status:  INITIAL  5.  The patient will have an improved BERG balance score to   33 /56 indicating reduced risk of falls  Baseline:  Goal status: INITIAL  6. The patient will have improved gait stamina and speed needed to ambulate 600 feet in 6 minutes with RW INITIAL    ASSESSMENT:  CLINICAL IMPRESSION: Continued work on pt's endurance and strength. Requires cues for forward weight shift with sit to stands without UE support to decrease posterior LOBs. Working on improving stepping/step length and weight shift. Husband states he can tell a difference in her walking at home. Pt is getting ready for multiple procedures (husband states she has 3 visits left of PT until then)  OBJECTIVE IMPAIRMENTS: decreased activity tolerance, decreased balance, decreased mobility, difficulty walking, decreased strength, decreased safety awareness, impaired perceived functional ability, and pain.   ACTIVITY LIMITATIONS: carrying, lifting, bending, squatting, stairs, bathing, toileting, dressing, hygiene/grooming, and locomotion level  PARTICIPATION LIMITATIONS: meal prep, cleaning, laundry, shopping, and community activity  PERSONAL FACTORS: Time since onset of injury/illness/exacerbation and 3+ comorbidities: osteoporosis with previous fractures, falls, decreased cognition  are also affecting patient's functional outcome.   REHAB POTENTIAL: Good  CLINICAL DECISION MAKING: moderate  EVALUATION COMPLEXITY: moderate  PLAN:  PT FREQUENCY: 2x/week  PT DURATION: 8 weeks  PLANNED INTERVENTIONS: Therapeutic exercises, Therapeutic activity, Neuromuscular re-education, Balance training, Gait training, Patient/Family education, Self Care, Aquatic Therapy, Manual therapy, and Re-evaluation  PLAN FOR NEXT SESSION: nu-step, Progress simple seated ex's for warm up;  sit to stand, standing exercise; gait with RW or without assistive device with CGA; practice reaching into  cabinets for cones  [facility] April Randalyn Rhea Dulce,  PT 08/21/22 12:30 PM   [facility] [address] [place], [place] [address] Phone # [phone] Fax [phone]

## 2022-08-23 ENCOUNTER — Ambulatory Visit: Payer: Medicare Other

## 2022-08-23 DIAGNOSIS — R262 Difficulty in walking, not elsewhere classified: Secondary | ICD-10-CM

## 2022-08-23 DIAGNOSIS — G8929 Other chronic pain: Secondary | ICD-10-CM

## 2022-08-23 DIAGNOSIS — R252 Cramp and spasm: Secondary | ICD-10-CM

## 2022-08-23 DIAGNOSIS — M6281 Muscle weakness (generalized): Secondary | ICD-10-CM | POA: Diagnosis not present

## 2022-08-23 DIAGNOSIS — R293 Abnormal posture: Secondary | ICD-10-CM

## 2022-08-23 DIAGNOSIS — R296 Repeated falls: Secondary | ICD-10-CM

## 2022-08-23 NOTE — Therapy (Signed)
OUTPATIENT PHYSICAL THERAPY NEURO TREATMENT Progress Note Reporting Period 07/06/22 to 08/23/22  See note below for Objective Data and Assessment of Progress/Goals.       Patient Name: Caitlin [name] MRN: [id number] DOB:06-29-1945, 77 y.o., female Today's Date: 08/23/2022    REFERRING PROVIDER: Berniece Andreas MD  END OF SESSION:  PT End of Session - 08/23/22 1235     Visit Number 10    Date for PT Re-Evaluation 08/31/22    Authorization Type [employer]    Progress Note Due on Visit 20    PT Start Time 1235    PT Stop Time 1315    PT Time Calculation (min) 40 min    Activity Tolerance Patient tolerated treatment well    Behavior During Therapy [facility] for tasks assessed/performed              Past Medical History:  Diagnosis Date   Allergic rhinitis 07/11/2006   Back pain 10/09/2007   Blood transfusion without reported diagnosis    Diaphragmatic hernia 07/11/2006   Diverticulosis of colon    Eczema 07/11/2006   Elevated cholesterol 04/29/2012   Enteritis    Erythema nodosum    Tends to be seasonal has had a negative chest x-ray in the past negative PPD   Essential hypertension 07/11/2006   Fatigue 03/12/2007   Fibromyalgia    Gastroparesis 12/23/2008   Generalized anxiety disorder 09/01/2014   GERD (gastroesophageal reflux disease)    History of stricture and dilatation and Nissen surgery   Hearing loss, left ear 11/27/2008   History of cataract    bilateral; s/p surgery   Hyperglycemia 08/15/2006   Irritable bowel syndrome 12/23/2008   Leg cramps, nocturnal 11/11/2007   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   MCI (mild cognitive impairment) 10/22/2018   Obstructive sleep apnea 04/23/2007   uses CPAP nightly; mask has leak   Osteoarthritis 11/28/2011   Osteopenia    Prediabetes 04/19/2011   Vitamin B12 deficiency 05/13/2010   Past Surgical History:  Procedure Laterality Date   ABDOMINAL HYSTERECTOMY     bone spur     CATARACT EXTRACTION W/ INTRAOCULAR  LENS  IMPLANT, BILATERAL     CESAREAN SECTION     x 1   CHOLECYSTECTOMY     COLONOSCOPY     DENTAL SURGERY     ESOPHAGOGASTRODUODENOSCOPY     stricture and dilatation   IR KYPHO EA ADDL LEVEL THORACIC OR LUMBAR  04/28/2022   IR KYPHO THORACIC WITH BONE BIOPSY  04/28/2022   IR RADIOLOGIST EVAL & MGMT  04/20/2022   IR RADIOLOGIST EVAL & MGMT  05/19/2022   left shoulder surgery  06/08, 07/12/17   x 2   MOUTH SURGERY Right 06/03/2019   NISSEN FUNDOPLICATION     ROTATOR CUFF REPAIR Left    rt knee surgery     scope   UPPER GASTROINTESTINAL ENDOSCOPY     Patient Active Problem List   Diagnosis Date Noted   Acute pain of right shoulder due to trauma 01/27/2022   Compression fracture of thoracolumbar vertebra, closed, initial encounter (HCC) 01/25/2022   Hypoxia 01/23/2022   Hypokalemia 01/23/2022   Fall with injury 01/23/2022   MCI (mild cognitive impairment) 10/22/2018   Generalized anxiety disorder 09/01/2014   Hair loss 08/12/2013   Erythema nodosum 06/05/2012   Elevated cholesterol 04/29/2012   Osteoarthritis 11/28/2011   GERD (gastroesophageal reflux disease)    Prediabetes 04/19/2011   Hypotension 06/07/2010   Vitamin B12 deficiency 05/13/2010   Gastroparesis 12/23/2008  Irritable bowel syndrome 12/23/2008   Hearing loss, left ear 11/27/2008   Leg cramps, nocturnal 11/11/2007   Back pain 10/09/2007   Obstructive sleep apnea 04/23/2007   Fatigue 03/12/2007   Diverticulosis of colon 12/10/2006   Fibromyalgia 12/10/2006   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   Hyperglycemia 08/15/2006   Essential hypertension 07/11/2006   Allergic rhinitis 07/11/2006   Diaphragmatic hernia 07/11/2006   Eczema 07/11/2006    ONSET DATE: January 2024  REFERRING DIAG: Berniece Andreas MD  THERAPY DIAG:  Muscle weakness (generalized)  Difficulty in walking, not elsewhere classified  Repeated falls  Abnormal posture  Cramp and spasm  Chronic bilateral low back pain with left-sided  sciatica  Rationale for Evaluation and Treatment: Rehabilitation  SUBJECTIVE:                                                                                                                                                                                             SUBJECTIVE STATEMENT: Spouse reports patient fell last night.  She was trying to get out of bed and slid off into floor.  Spouse was attempting to get around in time to catch her but was unsuccessful.  No injuries.     PERTINENT HISTORY:  Per neurology note 7/12: Alzheimer's suspected Mid to late August procedure to stretch esophagus Presumed osteoporosis secondary to multiple fractures including vertebral compression fractures in January and kyphoplasty in April IBS osteoporosis Husband is [name] PAIN:  Are you having pain? NO  PRECAUTIONS: Fall  WEIGHT BEARING RESTRICTIONS: No  FALLS: Has patient fallen in last 6 months? Yes. Number of falls 1  LIVING ENVIRONMENT: Lives with: lives in senior friendly community handicap accessible Lives in: House/apartment Stairs: No Has following equipment at home: Walker - 4 wheeled  PLOF: prior to kyphoplasty surgery, patient's husband states she was able to clean their home  PATIENT GOALS: walking, loves to clean house, be able to pick up trash from floor; be able to do laundry  OBJECTIVE:    COGNITION: Decreased ability to follow multi step directions;  needs demonstration, repetition and simple verbal cues   Short Physical Performance Battery:    Balance:       Side by side stance:  1  points (1)  Semi -tandem:   0  points (1)  Tandem:   0   points (2)    Gait Speed: (13.1 feet) done 2x take the best time: 1     points  13.40 sec                                           >  8.70 sec=1 pt                                           6.21- 8.70 sec=2 pt                                           4.82-6.20 sec=3 pt                                            < 4.82 = 4 pts     Repeated Chair Stands:   0   points  patient requires UE use to rise   (timer stopped when straight on 5th stand)                                                If used arms=0 pts                                              > 60 sec=0 pts                                              16.70 or more=1 pt                                               13.70-16.69=2 pts                                               11.20-13.69=3 pts                                               11.9 sec or less=4 pts Total score= 2     points <10/12 predictive of 1 or more mobility limitations and increased risk of mobility disability 6 or less/12 associated with a higher fall rate                                                   UPPER AND LOWER EXTREMITY ROM:   WFLS   LOWER EXTREMITY MMT:  Unable to rise from the chair without UE support  MMT Right Eval Left Eval  Hip flexion 3+ 3+  Hip extension    Hip abduction 3+ 3+  Hip adduction    Hip internal  rotation    Hip external rotation    Knee flexion 4 4  Knee extension 3+ 3+  Ankle dorsiflexion    Ankle plantarflexion 3+ 3+  Ankle inversion    Ankle eversion    (Blank rows = not tested)   GAIT: Assistive device utilized: Environmental consultant - 4 wheeled Level of assistance: SBA Comments: patient tends to rise from sit to stand pulling from the walker when in unlocked position  FUNCTIONAL TESTS:  TUG 1:09  several attempts to rise with UE assist 5x sit to stand with heavy UE use: 29.12 sec 2 min walk test with RW: 123 feet  BERG 24/56 = high risk of falls 100%  7/18; 4 min walk: 262 feet with RW 39.42 TUG no hands to rise 5x sit to stand 21.89 no hands holding 2# weight 8/7: 4 min walk: 300.10 feet with RW (needed frequent verbal cues to stay on task /easily distracted TUG:  attempted approx 5 different times but patient could not follow simple commands and test could not be completed 5x sit to stand:  initially attempted with no hands but patient  could not come to stand.  With hands she was able to do 26.25 seconds after multiple attempts at   TODAY'S TREATMENT:     DATE: 08/23/22 10th visit progress note data collected: see above (this took approx 30 min to complete as patient needed frequent re-directing) Sit to stand with heavy verbal cues for safe technique: patient could not do safe technique without repeated verbal cues Seated LAQ with 2 lbs x 20 each  Seated march x 20 with 2 lbs Seated clam x 20 with blue loop  Seated overhead press 2 x 10 with 1 lb Seated bicep curls 2 x 10 with 1 lb  DATE: 08/21/22 Nustep x 6 min, level 5  Box step around colored circles on the floor- hand held assistance by PT for safety Sit to stand from mat table no hands x10 Standing UE elevated marching 2x10 Multidirectional stepping on colored dots  Forward step over pool noodles (small) Side step over pool noodle Gait with RW CGA working on gait speed, increasing step length with multiple v/cs  DATE: 08/15/22 Nustep x 6 min, level 2 (old model)x 6 minutes Box step around colored circles on the floor- hand held assistance by PT for safety Standing 3 color circles at knee and hip height UE taps 12x Seated 2# overhead press 2x10 right/left alternating Sit to stand from mat table x10- use of arms.  Not able to do holding weights today Alternating step taps on 4" step x10 with UE support, x10 without UE support and PT CGA on gait belt Standing  red tband rows  x 10  Gait without RW with CGA with gait belt 200 x4 min (verbal cues for step length and to reduce shuffling) Hurdles next to barre: step to over hurdles with CGA assistance by PT on gait belt  PATIENT EDUCATION: Education details: Educated patient on anatomy and physiology of current symptoms, prognosis, plan of care as well as initial self care strategies to promote recovery Person educated: Patient Education method: Explanation Education comprehension: verbalized understanding  HOME  EXERCISE PROGRAM: Access Code: [email or web address] URL: [email or web address] Date: 07/17/2022 Prepared by: Mikey Kirschner  Exercises - Supine Hip Abduction  - 1 x daily - 7 x weekly - 2 sets - 10 reps - Supine Bridge  - 1 x daily - 7 x weekly - 2 sets - 10 reps - Hooklying Clamshell  with Resistance  - 1 x daily - 7 x weekly - 2 sets - 10 reps - Seated Hip Abduction with Resistance  - 1 x daily - 7 x weekly - 2 sets - 10 reps - Seated Long Arc Quad  - 1 x daily - 7 x weekly - 2 sets - 10 reps  GOALS: Goals reviewed with patient? Yes  SHORT TERM GOALS: Target date: 08/03/2022   The patient and husband will demonstrate knowledge of basic  exercises to promote mobility and strength for improved function Baseline: Goal status: met 7/16  2.  The patient will be able to walk for 4 minutes or 400 feet needed for short distance community ambulation Baseline:  Goal status: 4 min min 262 feet goal met 7/18  3.  The patient will have improved Timed Up and Go (TUG) time to    50 sec   sec indicating improved gait speed and LE strength  Baseline:  Goal status:met 7/18  4.  Able to rise sit to stand from standard height chair 1x without UE use Baseline:  Goal status: met 7/18     LONG TERM GOALS: Target date: 08/31/2022  The patient and husband will be independent in a safe self progression of a home exercise program to promote further recovery of function  Baseline:  Goal status: INITIAL  2.  The patient will have improved gait stamina and speed needed to ambulate 600 feet in 6 minutes  Baseline:  Goal status: IN PROGRESS  3.  SPPB test improved to at least 7/12 indicating lower fall risk and decreased frailty Baseline:  Goal status: INITIAL  4.  Improved LE strength needed for standing to perform light housework (wiping counters, folding laundry) Baseline:  Goal status: IN PROGRESS  5.  The patient will have an improved BERG balance score to   33 /56 indicating reduced  risk of falls  Baseline:  Goal status: INITIAL  6. The patient will have improved gait stamina and speed needed to ambulate 600 feet in 6 minutes with RW INITIAL    ASSESSMENT:  CLINICAL IMPRESSION: Xandra was at a cognitive deficit today.  She had significant difficulty following commands.  She needed frequent re-directing.  All objective findings show decline but patient is still functioning at an improved level, so this was likely due to her slower cognitive processing today.   She did show a significant improvement in her walk test.  She was able to walk approx 38 feet further in the 4 min walk test.  Spouse understands that there will be days when he will need to be more attentive to her for safety reasons.  She would benefit from continued skilled PT to gain strength to sustain her upcoming procedures.     OBJECTIVE IMPAIRMENTS: decreased activity tolerance, decreased balance, decreased mobility, difficulty walking, decreased strength, decreased safety awareness, impaired perceived functional ability, and pain.   ACTIVITY LIMITATIONS: carrying, lifting, bending, squatting, stairs, bathing, toileting, dressing, hygiene/grooming, and locomotion level  PARTICIPATION LIMITATIONS: meal prep, cleaning, laundry, shopping, and community activity  PERSONAL FACTORS: Time since onset of injury/illness/exacerbation and 3+ comorbidities: osteoporosis with previous fractures, falls, decreased cognition  are also affecting patient's functional outcome.   REHAB POTENTIAL: Good  CLINICAL DECISION MAKING: moderate  EVALUATION COMPLEXITY: moderate  PLAN:  PT FREQUENCY: 2x/week  PT DURATION: 8 weeks  PLANNED INTERVENTIONS: Therapeutic exercises, Therapeutic activity, Neuromuscular re-education, Balance training, Gait training, Patient/Family education, Self Care, Aquatic Therapy, Manual therapy, and Re-evaluation  PLAN FOR  NEXT SESSION: nu-step,  simple seated ex's for warm up;  sit to stand,  standing exercise; gait with RW or without assistive device with CGA; practice reaching into cabinets for cones  Shereen Marton B. Dessire Grimes, PT 08/23/22 4:02 PM [facility] [address], [address] [place], [place] [address] Phone # [phone] Fax [phone]

## 2022-08-24 ENCOUNTER — Other Ambulatory Visit: Payer: Self-pay | Admitting: Internal Medicine

## 2022-08-28 ENCOUNTER — Ambulatory Visit: Payer: Medicare Other | Admitting: Physical Therapy

## 2022-08-28 DIAGNOSIS — M6281 Muscle weakness (generalized): Secondary | ICD-10-CM | POA: Diagnosis not present

## 2022-08-28 DIAGNOSIS — R293 Abnormal posture: Secondary | ICD-10-CM

## 2022-08-28 DIAGNOSIS — R296 Repeated falls: Secondary | ICD-10-CM

## 2022-08-28 DIAGNOSIS — R262 Difficulty in walking, not elsewhere classified: Secondary | ICD-10-CM

## 2022-08-28 NOTE — Therapy (Signed)
OUTPATIENT PHYSICAL THERAPY NEURO TREATMENT  Patient Name: Caitlin [name] MRN: [id number] DOB:16-Jul-1945, 77 y.o., female Today's Date: 08/28/2022    REFERRING PROVIDER: Berniece Andreas MD  END OF SESSION:  PT End of Session - 08/28/22 1228     Visit Number 11    Date for PT Re-Evaluation 08/31/22    Authorization Type [employer]    Progress Note Due on Visit 20    PT Start Time 1230    PT Stop Time 1315    PT Time Calculation (min) 45 min    Activity Tolerance Patient tolerated treatment well    Behavior During Therapy [facility] for tasks assessed/performed              Past Medical History:  Diagnosis Date   Allergic rhinitis 07/11/2006   Back pain 10/09/2007   Blood transfusion without reported diagnosis    Diaphragmatic hernia 07/11/2006   Diverticulosis of colon    Eczema 07/11/2006   Elevated cholesterol 04/29/2012   Enteritis    Erythema nodosum    Tends to be seasonal has had a negative chest x-ray in the past negative PPD   Essential hypertension 07/11/2006   Fatigue 03/12/2007   Fibromyalgia    Gastroparesis 12/23/2008   Generalized anxiety disorder 09/01/2014   GERD (gastroesophageal reflux disease)    History of stricture and dilatation and Nissen surgery   Hearing loss, left ear 11/27/2008   History of cataract    bilateral; s/p surgery   Hyperglycemia 08/15/2006   Irritable bowel syndrome 12/23/2008   Leg cramps, nocturnal 11/11/2007   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   MCI (mild cognitive impairment) 10/22/2018   Obstructive sleep apnea 04/23/2007   uses CPAP nightly; mask has leak   Osteoarthritis 11/28/2011   Osteopenia    Prediabetes 04/19/2011   Vitamin B12 deficiency 05/13/2010   Past Surgical History:  Procedure Laterality Date   ABDOMINAL HYSTERECTOMY     bone spur     CATARACT EXTRACTION W/ INTRAOCULAR LENS  IMPLANT, BILATERAL     CESAREAN SECTION     x 1   CHOLECYSTECTOMY     COLONOSCOPY     DENTAL SURGERY      ESOPHAGOGASTRODUODENOSCOPY     stricture and dilatation   IR KYPHO EA ADDL LEVEL THORACIC OR LUMBAR  04/28/2022   IR KYPHO THORACIC WITH BONE BIOPSY  04/28/2022   IR RADIOLOGIST EVAL & MGMT  04/20/2022   IR RADIOLOGIST EVAL & MGMT  05/19/2022   left shoulder surgery  06/08, 07/12/17   x 2   MOUTH SURGERY Right 06/03/2019   NISSEN FUNDOPLICATION     ROTATOR CUFF REPAIR Left    rt knee surgery     scope   UPPER GASTROINTESTINAL ENDOSCOPY     Patient Active Problem List   Diagnosis Date Noted   Acute pain of right shoulder due to trauma 01/27/2022   Compression fracture of thoracolumbar vertebra, closed, initial encounter (HCC) 01/25/2022   Hypoxia 01/23/2022   Hypokalemia 01/23/2022   Fall with injury 01/23/2022   MCI (mild cognitive impairment) 10/22/2018   Generalized anxiety disorder 09/01/2014   Hair loss 08/12/2013   Erythema nodosum 06/05/2012   Elevated cholesterol 04/29/2012   Osteoarthritis 11/28/2011   GERD (gastroesophageal reflux disease)    Prediabetes 04/19/2011   Hypotension 06/07/2010   Vitamin B12 deficiency 05/13/2010   Gastroparesis 12/23/2008   Irritable bowel syndrome 12/23/2008   Hearing loss, left ear 11/27/2008   Leg cramps, nocturnal 11/11/2007   Back pain  10/09/2007   Obstructive sleep apnea 04/23/2007   Fatigue 03/12/2007   Diverticulosis of colon 12/10/2006   Fibromyalgia 12/10/2006   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   Hyperglycemia 08/15/2006   Essential hypertension 07/11/2006   Allergic rhinitis 07/11/2006   Diaphragmatic hernia 07/11/2006   Eczema 07/11/2006    ONSET DATE: January 2024  REFERRING DIAG: Caitlin Andreas MD  THERAPY DIAG:  Muscle weakness (generalized)  Difficulty in walking, not elsewhere classified  Repeated falls  Abnormal posture  Rationale for Evaluation and Treatment: Rehabilitation  SUBJECTIVE:                                                                                                                                                                                              SUBJECTIVE STATEMENT: Pt and family report no other falls. States her R knee is a little sore from the fall. Spouse notes difficulty for pt to perform floor to stand transfers. Plans to have esophagus stretched and then hopefully resume PT after.   PERTINENT HISTORY:  Per neurology note 7/12: Alzheimer's suspected Mid to late August procedure to stretch esophagus Presumed osteoporosis secondary to multiple fractures including vertebral compression fractures in January and kyphoplasty in April IBS osteoporosis Husband is Caitlin [name] PAIN:  Are you having pain? NO  PRECAUTIONS: Fall  WEIGHT BEARING RESTRICTIONS: No  FALLS: Has patient fallen in last 6 months? Yes. Number of falls 1  LIVING ENVIRONMENT: Lives with: lives in senior friendly community handicap accessible Lives in: House/apartment Stairs: No Has following equipment at home: Walker - 4 wheeled  PLOF: prior to kyphoplasty surgery, patient's husband states she was able to clean their home  PATIENT GOALS: walking, loves to clean house, be able to pick up trash from floor; be able to do laundry  OBJECTIVE:    COGNITION: Decreased ability to follow multi step directions;  needs demonstration, repetition and simple verbal cues   Short Physical Performance Battery:    Balance:       Side by side stance:  1  points (1)  Semi -tandem:   0  points (1)  Tandem:   0   points (2)    Gait Speed: (13.1 feet) done 2x take the best time: 1     points  13.40 sec                                           > 8.70 sec=1 pt  6.21- 8.70 sec=2 pt                                           4.82-6.20 sec=3 pt                                            < 4.82 = 4 pts    Repeated Chair Stands:   0   points  patient requires UE use to rise   (timer stopped when straight on 5th stand)                                                If  used arms=0 pts                                              > 60 sec=0 pts                                              16.70 or more=1 pt                                               13.70-16.69=2 pts                                               11.20-13.69=3 pts                                               11.9 sec or less=4 pts Total score= 2     points <10/12 predictive of 1 or more mobility limitations and increased risk of mobility disability 6 or less/12 associated with a higher fall rate                                                  Short Physical Performance Battery 08/28/22:    Balance:       Side by side stance:  1  points (1)  Semi -tandem:   1  points (1)  Tandem:   1   points (2)    Gait Speed: (13.1 feet) done 2x take the best time: 1     points  10.1 sec                                           >  8.70 sec=1 pt                                           6.21- 8.70 sec=2 pt                                           4.82-6.20 sec=3 pt                                            < 4.82 = 4 pts    Repeated Chair Stands:   1   points  (timer stopped when straight on 5th stand)  19.86 sec                                              If used arms=0 pts                                              > 60 sec=0 pts                                              16.70 or more=1 pt                                               13.70-16.69=2 pts                                               11.20-13.69=3 pts                                               11.9 sec or less=4 pts Total score= 5     points <10/12 predictive of 1 or more mobility limitations and increased risk of mobility disability 6 or less/12 associated with a higher fall rate   UPPER AND LOWER EXTREMITY ROM:   WFLS   LOWER EXTREMITY MMT:  Unable to rise from the chair without UE support  MMT Right Eval Left Eval  Hip flexion 3+ 3+  Hip extension    Hip abduction 3+ 3+  Hip adduction    Hip internal  rotation    Hip external rotation    Knee flexion 4 4  Knee extension 3+ 3+  Ankle dorsiflexion    Ankle plantarflexion 3+ 3+  Ankle inversion    Ankle eversion    (Blank rows = not tested)   GAIT: Assistive device utilized: Environmental consultant - 4 wheeled  Level of assistance: SBA Comments: patient tends to rise from sit to stand pulling from the walker when in unlocked position  FUNCTIONAL TESTS:  TUG 1:09  several attempts to rise with UE assist 5x sit to stand with heavy UE use: 29.12 sec 2 min walk test with RW: 123 feet  BERG 24/56 = high risk of falls 100%  7/18; 4 min walk: 262 feet with RW 39.42 TUG no hands to rise 5x sit to stand 21.89 no hands holding 2# weight  8/7: 4 min walk: 300.10 feet with RW (needed frequent verbal cues to stay on task /easily distracted TUG:  attempted approx 5 different times but patient could not follow simple commands and test could not be completed 5x sit to stand:  initially attempted with no hands but patient could not come to stand.  With hands she was able to do 26.25 seconds after multiple attempts at   TODAY'S TREATMENT:     DATE: 08/28/22 Nustep x 6 min, level 5 Sit to stand from mat table no hands 3x5 Semi tandem x30 sec R&L Tandem x30 sec R&L intermittent UE support Step out and then feet together no UE support 2x10 TUG x5 reps (31.00 sec fastest time today) -- slowest when trying to turn around, step back and sit back down Forwards and backwards walking Standing high knees 2 x 10 (cues to get knees up to elevated hands) Standing hip ext 2 x10 Standing hip abd 2 x10  DATE: 08/23/22 10th visit progress note data collected: see above (this took approx 30 min to complete as patient needed frequent re-directing) Sit to stand with heavy verbal cues for safe technique: patient could not do safe technique without repeated verbal cues Seated LAQ with 2 lbs x 20 each  Seated march x 20 with 2 lbs Seated clam x 20 with blue loop  Seated  overhead press 2 x 10 with 1 lb Seated bicep curls 2 x 10 with 1 lb  DATE: 08/21/22 Nustep x 6 min, level 5  Box step around colored circles on the floor- hand held assistance by PT for safety Sit to stand from mat table no hands x10 Standing UE elevated marching 2x10 Multidirectional stepping on colored dots  Forward step over pool noodles (small) Side step over pool noodle Gait with RW CGA working on gait speed, increasing step length with multiple v/cs  DATE: 08/15/22 Nustep x 6 min, level 2 (old model)x 6 minutes Box step around colored circles on the floor- hand held assistance by PT for safety Standing 3 color circles at knee and hip height UE taps 12x Seated 2# overhead press 2x10 right/left alternating Sit to stand from mat table x10- use of arms.  Not able to do holding weights today Alternating step taps on 4" step x10 with UE support, x10 without UE support and PT CGA on gait belt Standing  red tband rows  x 10  Gait without RW with CGA with gait belt 200 x4 min (verbal cues for step length and to reduce shuffling) Hurdles next to barre: step to over hurdles with CGA assistance by PT on gait belt  PATIENT EDUCATION: Education details: Educated patient on anatomy and physiology of current symptoms, prognosis, plan of care as well as initial self care strategies to promote recovery Person educated: Patient Education method: Explanation Education comprehension: verbalized understanding  HOME EXERCISE PROGRAM: Access Code: [email or web address] URL: [email or web address] Date: 07/17/2022 Prepared by: Mikey Kirschner  Exercises - Supine Hip Abduction  -  1 x daily - 7 x weekly - 2 sets - 10 reps - Supine Bridge  - 1 x daily - 7 x weekly - 2 sets - 10 reps - Hooklying Clamshell with Resistance  - 1 x daily - 7 x weekly - 2 sets - 10 reps - Seated Hip Abduction with Resistance  - 1 x daily - 7 x weekly - 2 sets - 10 reps - Seated Long Arc Quad  - 1 x daily - 7 x weekly - 2  sets - 10 reps  GOALS: Goals reviewed with patient? Yes  SHORT TERM GOALS: Target date: 08/03/2022   The patient and husband will demonstrate knowledge of basic  exercises to promote mobility and strength for improved function Baseline: Goal status: met 7/16  2.  The patient will be able to walk for 4 minutes or 400 feet needed for short distance community ambulation Baseline:  Goal status: 4 min min 262 feet goal met 7/18  3.  The patient will have improved Timed Up and Go (TUG) time to    50 sec   sec indicating improved gait speed and LE strength  Baseline:  Goal status:met 7/18  4.  Able to rise sit to stand from standard height chair 1x without UE use Baseline:  Goal status: met 7/18     LONG TERM GOALS: Target date: 08/31/2022  The patient and husband will be independent in a safe self progression of a home exercise program to promote further recovery of function  Baseline:  Goal status: INITIAL  2.  The patient will have improved gait stamina and speed needed to ambulate 600 feet in 6 minutes  Baseline:  Goal status: IN PROGRESS  3.  SPPB test improved to at least 7/12 indicating lower fall risk and decreased frailty Baseline:  08/28/22: 5/12 Goal status: IN PROGRESS  4.  Improved LE strength needed for standing to perform light housework (wiping counters, folding laundry) Baseline:  Goal status: IN PROGRESS  5.  The patient will have an improved BERG balance score to   33 /56 indicating reduced risk of falls  Baseline:  Goal status: INITIAL  6. The patient will have improved gait stamina and speed needed to ambulate 600 feet in 6 minutes with RW INITIAL    ASSESSMENT:  CLINICAL IMPRESSION: Performed SPPB today -- Ms. Delaney has improved her score to 5/12. Gait speed remains slow but has improved by a few seconds. Spouse states he can tell she is getting stronger. Will be getting her procedure next week. Has 1 more visit and then plans to hopefully start  PT again after her procedure.  OBJECTIVE IMPAIRMENTS: decreased activity tolerance, decreased balance, decreased mobility, difficulty walking, decreased strength, decreased safety awareness, impaired perceived functional ability, and pain.   ACTIVITY LIMITATIONS: carrying, lifting, bending, squatting, stairs, bathing, toileting, dressing, hygiene/grooming, and locomotion level  PARTICIPATION LIMITATIONS: meal prep, cleaning, laundry, shopping, and community activity  PERSONAL FACTORS: Time since onset of injury/illness/exacerbation and 3+ comorbidities: osteoporosis with previous fractures, falls, decreased cognition  are also affecting patient's functional outcome.   REHAB POTENTIAL: Good  CLINICAL DECISION MAKING: moderate  EVALUATION COMPLEXITY: moderate  PLAN:  PT FREQUENCY: 2x/week  PT DURATION: 8 weeks  PLANNED INTERVENTIONS: Therapeutic exercises, Therapeutic activity, Neuromuscular re-education, Balance training, Gait training, Patient/Family education, Self Care, Aquatic Therapy, Manual therapy, and Re-evaluation  PLAN FOR NEXT SESSION: Finish checking LTGs,  simple seated ex's for warm up;  sit to stand, standing exercise; gait with RW  or without assistive device with CGA; practice reaching into cabinets for cones  [facility] April Randalyn Rhea [place], PT 08/28/22 12:34 PM [facility] [address], [address] [place], [place] [address] Phone # [phone] Fax [phone]

## 2022-08-30 ENCOUNTER — Ambulatory Visit: Payer: Medicare Other

## 2022-08-30 DIAGNOSIS — R262 Difficulty in walking, not elsewhere classified: Secondary | ICD-10-CM

## 2022-08-30 DIAGNOSIS — R252 Cramp and spasm: Secondary | ICD-10-CM

## 2022-08-30 DIAGNOSIS — R296 Repeated falls: Secondary | ICD-10-CM

## 2022-08-30 DIAGNOSIS — M6281 Muscle weakness (generalized): Secondary | ICD-10-CM

## 2022-08-30 DIAGNOSIS — G8929 Other chronic pain: Secondary | ICD-10-CM

## 2022-08-30 DIAGNOSIS — R293 Abnormal posture: Secondary | ICD-10-CM

## 2022-08-30 DIAGNOSIS — M5442 Lumbago with sciatica, left side: Secondary | ICD-10-CM

## 2022-08-30 NOTE — Therapy (Signed)
OUTPATIENT PHYSICAL THERAPY NEURO TREATMENT PHYSICAL THERAPY DISCHARGE SUMMARY  Visits from Start of Care: 12  Current functional level related to goals / functional outcomes: See below   Remaining deficits: See below   Education / Equipment: See below   Patient agrees to discharge. Patient goals were partially met. Patient is being discharged due to  will be having esophagus surgery.   Patient Name: Caitlin [name] MRN: [id number] DOB:11-18-1945, 77 y.o., female Today's Date: 08/30/2022    REFERRING PROVIDER: Berniece Andreas MD  END OF SESSION:  PT End of Session - 08/30/22 1228     Visit Number 12    Date for PT Re-Evaluation 08/31/22    Authorization Type [employer]    PT Start Time 1228    PT Stop Time 1315    PT Time Calculation (min) 47 min    Activity Tolerance Patient limited by lethargy    Behavior During Therapy Flat affect              Past Medical History:  Diagnosis Date   Allergic rhinitis 07/11/2006   Back pain 10/09/2007   Blood transfusion without reported diagnosis    Diaphragmatic hernia 07/11/2006   Diverticulosis of colon    Eczema 07/11/2006   Elevated cholesterol 04/29/2012   Enteritis    Erythema nodosum    Tends to be seasonal has had a negative chest x-ray in the past negative PPD   Essential hypertension 07/11/2006   Fatigue 03/12/2007   Fibromyalgia    Gastroparesis 12/23/2008   Generalized anxiety disorder 09/01/2014   GERD (gastroesophageal reflux disease)    History of stricture and dilatation and Nissen surgery   Hearing loss, left ear 11/27/2008   History of cataract    bilateral; s/p surgery   Hyperglycemia 08/15/2006   Irritable bowel syndrome 12/23/2008   Leg cramps, nocturnal 11/11/2007   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   MCI (mild cognitive impairment) 10/22/2018   Obstructive sleep apnea 04/23/2007   uses CPAP nightly; mask has leak   Osteoarthritis 11/28/2011   Osteopenia    Prediabetes 04/19/2011    Vitamin B12 deficiency 05/13/2010   Past Surgical History:  Procedure Laterality Date   ABDOMINAL HYSTERECTOMY     bone spur     CATARACT EXTRACTION W/ INTRAOCULAR LENS  IMPLANT, BILATERAL     CESAREAN SECTION     x 1   CHOLECYSTECTOMY     COLONOSCOPY     DENTAL SURGERY     ESOPHAGOGASTRODUODENOSCOPY     stricture and dilatation   IR KYPHO EA ADDL LEVEL THORACIC OR LUMBAR  04/28/2022   IR KYPHO THORACIC WITH BONE BIOPSY  04/28/2022   IR RADIOLOGIST EVAL & MGMT  04/20/2022   IR RADIOLOGIST EVAL & MGMT  05/19/2022   left shoulder surgery  06/08, 07/12/17   x 2   MOUTH SURGERY Right 06/03/2019   NISSEN FUNDOPLICATION     ROTATOR CUFF REPAIR Left    rt knee surgery     scope   UPPER GASTROINTESTINAL ENDOSCOPY     Patient Active Problem List   Diagnosis Date Noted   Acute pain of right shoulder due to trauma 01/27/2022   Compression fracture of thoracolumbar vertebra, closed, initial encounter (HCC) 01/25/2022   Hypoxia 01/23/2022   Hypokalemia 01/23/2022   Fall with injury 01/23/2022   MCI (mild cognitive impairment) 10/22/2018   Generalized anxiety disorder 09/01/2014   Hair loss 08/12/2013   Erythema nodosum 06/05/2012   Elevated cholesterol 04/29/2012   Osteoarthritis  11/28/2011   GERD (gastroesophageal reflux disease)    Prediabetes 04/19/2011   Hypotension 06/07/2010   Vitamin B12 deficiency 05/13/2010   Gastroparesis 12/23/2008   Irritable bowel syndrome 12/23/2008   Hearing loss, left ear 11/27/2008   Leg cramps, nocturnal 11/11/2007   Back pain 10/09/2007   Obstructive sleep apnea 04/23/2007   Fatigue 03/12/2007   Diverticulosis of colon 12/10/2006   Fibromyalgia 12/10/2006   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   Hyperglycemia 08/15/2006   Essential hypertension 07/11/2006   Allergic rhinitis 07/11/2006   Diaphragmatic hernia 07/11/2006   Eczema 07/11/2006    ONSET DATE: January 2024  REFERRING DIAG: Berniece Andreas MD  THERAPY DIAG:  Muscle weakness  (generalized)  Difficulty in walking, not elsewhere classified  Repeated falls  Abnormal posture  Cramp and spasm  Chronic bilateral low back pain with left-sided sciatica  Rationale for Evaluation and Treatment: Rehabilitation  SUBJECTIVE:                                                                                                                                                                                             SUBJECTIVE STATEMENT: Patient in bathroom upon initiation of treatment.  Started appt 12 min after appt time.  Spouse reports no falls since last visit.  States her R knee is still sore, her right hip and her low back are also sore from the fall.  Plans to have esophagus stretched and then hopefully resume PT after.   PERTINENT HISTORY:  Per neurology note 7/12: Alzheimer's suspected Mid to late August procedure to stretch esophagus Presumed osteoporosis secondary to multiple fractures including vertebral compression fractures in January and kyphoplasty in April IBS osteoporosis Husband is Caitlin [name] PAIN:  Are you having pain? NO  PRECAUTIONS: Fall  WEIGHT BEARING RESTRICTIONS: No  FALLS: Has patient fallen in last 6 months? Yes. Number of falls 1  LIVING ENVIRONMENT: Lives with: lives in senior friendly community handicap accessible Lives in: House/apartment Stairs: No Has following equipment at home: Walker - 4 wheeled  PLOF: prior to kyphoplasty surgery, patient's husband states she was able to clean their home  PATIENT GOALS: walking, loves to clean house, be able to pick up trash from floor; be able to do laundry  OBJECTIVE:    COGNITION: Decreased ability to follow multi step directions;  needs demonstration, repetition and simple verbal cues   Short Physical Performance Battery:    Balance:       Side by side stance:  1  points (1)  Semi -tandem:   0  points (1)  Tandem:   0   points (2)  Gait Speed: (13.1 feet) done 2x take the best  time: 1     points  13.40 sec                                           > 8.70 sec=1 pt                                           6.21- 8.70 sec=2 pt                                           4.82-6.20 sec=3 pt                                            < 4.82 = 4 pts    Repeated Chair Stands:   0   points  patient requires UE use to rise   (timer stopped when straight on 5th stand)                                                If used arms=0 pts                                              > 60 sec=0 pts                                              16.70 or more=1 pt                                               13.70-16.69=2 pts                                               11.20-13.69=3 pts                                               11.9 sec or less=4 pts Total score= 2     points <10/12 predictive of 1 or more mobility limitations and increased risk of mobility disability 6 or less/12 associated with a higher fall rate  Short Physical Performance Battery 08/28/22:    Balance:       Side by side stance:  1  points (1)  Semi -tandem:   1  points (1)  Tandem:   1   points (2)    Gait Speed: (13.1 feet) done 2x take the best time: 1     points  10.1 sec                                           > 8.70 sec=1 pt                                           6.21- 8.70 sec=2 pt                                           4.82-6.20 sec=3 pt                                            < 4.82 = 4 pts    Repeated Chair Stands:   1   points  (timer stopped when straight on 5th stand)  19.86 sec                                              If used arms=0 pts                                              > 60 sec=0 pts                                              16.70 or more=1 pt                                               13.70-16.69=2 pts                                               11.20-13.69=3 pts                                                11.9 sec or less=4 pts Total score= 5     points <10/12 predictive of 1 or more mobility limitations and increased risk of mobility disability 6 or less/12 associated with a higher fall rate  UPPER AND LOWER EXTREMITY ROM:   WFLS   LOWER EXTREMITY MMT:  Unable to rise from the chair without UE support at initial eval        08/30/22:  patient now able to rise x 5-10 reps with use of hands on chair with ease as long as she is distracted with conversation.    MMT Right Eval Right  08/30/22 Left Eval Left  08/30/22  Hip flexion 3+ 4 3+ 4  Hip extension      Hip abduction 3+ 4- 3+ 4-  Hip adduction      Hip internal rotation      Hip external rotation      Knee flexion 4 4 4 4   Knee extension 3+ 4 3+ 4  Ankle dorsiflexion  4  4-  Ankle plantarflexion 3+ 4 3+ 4  Ankle inversion      Ankle eversion      (Blank rows = not tested)   GAIT: Assistive device utilized: Environmental consultant - 4 wheeled Level of assistance: SBA Comments: patient tends to rise from sit to stand pulling from the walker when in unlocked position  FUNCTIONAL TESTS:  TUG 1:09  several attempts to rise with UE assist 5x sit to stand with heavy UE use: 29.12 sec 2 min walk test with RW: 123 feet  BERG 24/56 = high risk of falls 100%  7/18; 4 min walk: 262 feet with RW 39.42 TUG no hands to rise 5x sit to stand 21.89 no hands holding 2# weight  8/7: 4 min walk: 300.10 feet with RW (needed frequent verbal cues to stay on task /easily distracted TUG:  attempted approx 5 different times but patient could not follow simple commands and test could not be completed 5x sit to stand:  initially attempted with no hands but patient could not come to stand.  With hands she was able to do 26.25 seconds after multiple attempts at   8/14: 4 min walk: 318.11 feet with RW (needed frequent verbal cues to stay on task /easily distracted TUG:  20.00 sec 5x sit to stand:  again, initially attempted with no hands but patient could not  come to stand.  With hands she was able to do 17.18 seconds   TODAY'S TREATMENT:     DATE: 08/30/22 DC assessment completed Reviewed most important "take aways" from therapy to focus on after DC including fall prevention, posture, increased step length, walking daily, sitting posture and basic leg strength with HEP.    DATE: 08/28/22 Nustep x 6 min, level 5 Sit to stand from mat table no hands 3x5 Semi tandem x30 sec R&L Tandem x30 sec R&L intermittent UE support Step out and then feet together no UE support 2x10 TUG x5 reps (31.00 sec fastest time today) -- slowest when trying to turn around, step back and sit back down Forwards and backwards walking Standing high knees 2 x 10 (cues to get knees up to elevated hands) Standing hip ext 2 x10 Standing hip abd 2 x10  DATE: 08/23/22 10th visit progress note data collected: see above (this took approx 30 min to complete as patient needed frequent re-directing) Sit to stand with heavy verbal cues for safe technique: patient could not do safe technique without repeated verbal cues Seated LAQ with 2 lbs x 20 each  Seated march x 20 with 2 lbs Seated clam x 20 with blue loop  Seated overhead press 2 x 10 with 1 lb Seated bicep curls 2 x 10  with 1 lb  PATIENT EDUCATION: Education details: Educated patient on anatomy and physiology of current symptoms, prognosis, plan of care as well as initial self care strategies to promote recovery Person educated: Patient Education method: Explanation Education comprehension: verbalized understanding  HOME EXERCISE PROGRAM: Access Code: [email or web address] URL: [email or web address] Date: 07/17/2022 Prepared by: Mikey Kirschner  Exercises - Supine Hip Abduction  - 1 x daily - 7 x weekly - 2 sets - 10 reps - Supine Bridge  - 1 x daily - 7 x weekly - 2 sets - 10 reps - Hooklying Clamshell with Resistance  - 1 x daily - 7 x weekly - 2 sets - 10 reps - Seated Hip Abduction with Resistance  - 1 x daily  - 7 x weekly - 2 sets - 10 reps - Seated Long Arc Quad  - 1 x daily - 7 x weekly - 2 sets - 10 reps  GOALS: Goals reviewed with patient? Yes  SHORT TERM GOALS: Target date: 08/03/2022   The patient and husband will demonstrate knowledge of basic  exercises to promote mobility and strength for improved function Baseline: Goal status: met 7/16  2.  The patient will be able to walk for 4 minutes or 400 feet needed for short distance community ambulation Baseline:  Goal status: 4 min min 318.11 feet goal surpassed  3.  The patient will have improved Timed Up and Go (TUG) time to    50 sec   sec indicating improved gait speed and LE strength Baseline:  Goal status:met 7/18  4.  Able to rise sit to stand from standard height chair 1x without UE use Baseline:  Goal status: met 7/18     LONG TERM GOALS: Target date: 08/31/2022  The patient and husband will be independent in a safe self progression of a home exercise program to promote further recovery of function  Baseline:  Goal status: MET 08/30/22  2.  The patient will have improved gait stamina and speed needed to ambulate 600 feet in 6 minutes  Baseline:  Goal status: NOT MET  3.  SPPB test improved to at least 7/12 indicating lower fall risk and decreased frailty Baseline:  08/28/22: 5/12 Goal status: NOT MET  4.  Improved LE strength needed for standing to perform light housework (wiping counters, folding laundry) Baseline:  Goal status: MET 08/30/22  5.  The patient will have an improved BERG balance score to   33 /56 indicating reduced risk of falls  Baseline:  Goal status: REVISED to other functional tests, these all improved : see above   ASSESSMENT:  CLINICAL IMPRESSION: Caitlin [name] is stronger and more functional but cognitive issues appear to be progressing.  She will need close supervision to follow through with home safety and fall prevention.  She will also need assistance with her HEP.  Spouse understands this.   She did demonstrate improvements with functional testing.  Spouse reports improved function at home when cognitive status is not compromised.    OBJECTIVE IMPAIRMENTS: decreased activity tolerance, decreased balance, decreased mobility, difficulty walking, decreased strength, decreased safety awareness, impaired perceived functional ability, and pain.   ACTIVITY LIMITATIONS: carrying, lifting, bending, squatting, stairs, bathing, toileting, dressing, hygiene/grooming, and locomotion level  PARTICIPATION LIMITATIONS: meal prep, cleaning, laundry, shopping, and community activity  PERSONAL FACTORS: Time since onset of injury/illness/exacerbation and 3+ comorbidities: osteoporosis with previous fractures, falls, decreased cognition  are also affecting patient's functional outcome.   REHAB POTENTIAL: Good  CLINICAL DECISION MAKING: moderate  EVALUATION  COMPLEXITY: moderate  PLAN:  PT FREQUENCY: 2x/week  PT DURATION: 8 weeks  PLANNED INTERVENTIONS: Therapeutic exercises, Therapeutic activity, Neuromuscular re-education, Balance training, Gait training, Patient/Family education, Self Care, Aquatic Therapy, Manual therapy, and Re-evaluation  PLAN FOR NEXT SESSION: DC with HEP.   Victorino Dike B. Jayvion Stefanski, PT 08/30/22 5:10 PM [facility] [address] [place], [place] [address] Phone # [phone] Fax [phone]

## 2022-08-31 ENCOUNTER — Encounter (INDEPENDENT_AMBULATORY_CARE_PROVIDER_SITE_OTHER): Payer: Self-pay

## 2022-09-01 NOTE — Progress Notes (Signed)
Caitlin [name]    [id number]    07-18-1945  Primary Care Physician:Panosh, Neta Mends, MD  Referring Physician: Madelin Headings, MD [address] [place],  [place] [address]   Chief complaint: Dysphagia, IBS  HPI: 77 year old very pleasant female here for follow-up visit for GERD symptoms and dysphagia symptoms.    Last seen on 05-24-22.   Today,   GI Hx:  Barium Swallow 06-14-22 1. Moderately narrowed appearance of the distal esophagus concerning for possible stricture. Endoscopy should be considered for further evaluation. 2. Moderate esophageal dysmotility. 3. Small to moderate-sized hiatal hernia. 4. Small curvilinear focus of contrast projecting rightward (above the diaphragm) at the level of the hiatal hernia. This likely reflects contrast tracking within a gastric fold which previously contributed to the Nissen wrap. 5. The patient was unable to swallow a 13 mm barium tablet despite multiple attempts.  DG Swallow Function 06-14-22 1. Modified barium swallow as described. 2. Laryngeal penetration was observed with the patient drinking thin liquid barium. No frank aspiration was observed. 3. Please refer to the speech pathologist's report for complete details and recommendations.  Colonoscopy 10/04/2016: Diverticulosis. Internal hemorrhoids. recall colonoscopy 10 years   EGD 10/04/2016: 6cm Hiatal hernia, Slipped Nissen   Denies any nausea, vomiting, abdominal pain, melena or bright red blood per rectum     Current Outpatient Medications:    acetaminophen (TYLENOL) 325 MG tablet, Take 2 tablets (650 mg total) by mouth every 6 (six) hours as needed for mild pain (or Fever >/= 101)., Disp: , Rfl:    buPROPion (WELLBUTRIN XL) 150 MG 24 hr tablet, Take 1 tablet by mouth once daily, Disp: 90 tablet, Rfl: 0   Cholecalciferol (VITAMIN D3) 1000 units CAPS, Take 1,000 Units by mouth daily. (Patient not taking: Reported on 07/28/2022), Disp: , Rfl:     Cyanocobalamin (VITAMIN B-12) 1000 MCG SUBL, Place 2,000 mcg under the tongue daily., Disp: , Rfl:    escitalopram (LEXAPRO) 10 MG tablet, TAKE 1 & 1/2 (ONE & ONE-HALF) TABLETS BY MOUTH ONCE DAILY, Disp: 135 tablet, Rfl: 0   escitalopram (LEXAPRO) 5 MG tablet, Take 3 tablets (15 mg total) by mouth daily., Disp: , Rfl:    famotidine (PEPCID) 40 MG tablet, Take 1 tablet by mouth once daily, Disp: 90 tablet, Rfl: 0   memantine (NAMENDA) 5 MG tablet, Take 1 tablet at night, Disp: 30 tablet, Rfl: 11   Multiple Vitamins-Minerals (PRESERVISION AREDS 2) CAPS, Take 1 capsule by mouth 2 (two) times a week. (Patient not taking: Reported on 07/28/2022), Disp: , Rfl:    pantoprazole (PROTONIX) 40 MG tablet, Take 1 tablet (40 mg total) by mouth daily., Disp: 90 tablet, Rfl: 3   potassium chloride (KLOR-CON) 10 MEQ tablet, TAKE 1 TABLET BY MOUTH EVERY OTHER DAY WITH  FLUID  TABLET, Disp: 30 tablet, Rfl: 0   SIMPLY SALINE 0.9 % AERS, Place 1 spray into both nostrils in the morning and at bedtime., Disp: , Rfl:     Allergies as of 09/04/2022 - Review Complete 08/30/2022  Allergen Reaction Noted   Codeine phosphate Other (See Comments) 03/15/2006   Mobic [meloxicam] Diarrhea 11/28/2011   Protonix [pantoprazole sodium] Diarrhea and Other (See Comments) 06/05/2012   Ibuprofen Diarrhea and Other (See Comments) 01/23/2022   Peppermint oil Other (See Comments) 01/23/2022   Aleve [naproxen] Nausea Only 05/15/2016   Colestipol hcl  05/24/2017   Omeprazole-sodium bicarbonate Diarrhea and Other (See Comments) 12/23/2008  Sodium Other (See Comments) 11/16/2017   Sulfamethoxazole Diarrhea and Nausea And Vomiting 03/15/2006    Past Medical History:  Diagnosis Date   Allergic rhinitis 07/11/2006   Back pain 10/09/2007   Blood transfusion without reported diagnosis    Diaphragmatic hernia 07/11/2006   Diverticulosis of colon    Eczema 07/11/2006   Elevated cholesterol 04/29/2012   Enteritis    Erythema nodosum     Tends to be seasonal has had a negative chest x-ray in the past negative PPD   Essential hypertension 07/11/2006   Fatigue 03/12/2007   Fibromyalgia    Gastroparesis 12/23/2008   Generalized anxiety disorder 09/01/2014   GERD (gastroesophageal reflux disease)    History of stricture and dilatation and Nissen surgery   Hearing loss, left ear 11/27/2008   History of cataract    bilateral; s/p surgery   Hyperglycemia 08/15/2006   Irritable bowel syndrome 12/23/2008   Leg cramps, nocturnal 11/11/2007   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   MCI (mild cognitive impairment) 10/22/2018   Obstructive sleep apnea 04/23/2007   uses CPAP nightly; mask has leak   Osteoarthritis 11/28/2011   Osteopenia    Prediabetes 04/19/2011   Vitamin B12 deficiency 05/13/2010    Past Surgical History:  Procedure Laterality Date   ABDOMINAL HYSTERECTOMY     bone spur     CATARACT EXTRACTION W/ INTRAOCULAR LENS  IMPLANT, BILATERAL     CESAREAN SECTION     x 1   CHOLECYSTECTOMY     COLONOSCOPY     DENTAL SURGERY     ESOPHAGOGASTRODUODENOSCOPY     stricture and dilatation   IR KYPHO EA ADDL LEVEL THORACIC OR LUMBAR  04/28/2022   IR KYPHO THORACIC WITH BONE BIOPSY  04/28/2022   IR RADIOLOGIST EVAL & MGMT  04/20/2022   IR RADIOLOGIST EVAL & MGMT  05/19/2022   left shoulder surgery  06/08, 07/12/17   x 2   MOUTH SURGERY Right 06/03/2019   NISSEN FUNDOPLICATION     ROTATOR CUFF REPAIR Left    rt knee surgery     scope   UPPER GASTROINTESTINAL ENDOSCOPY      Family History  Problem Relation Age of Onset   Depression Mother    Kidney cancer Mother    Dementia Mother        Unspecified, likely secondary to other medical condition (cancer)   Depression Father    Hiatal hernia Father    COPD Sister    Dementia Sister        Unspecified, likely secondary to other medical condition   Nephrolithiasis Other    Colon cancer Neg Hx    Esophageal cancer Neg Hx    Pancreatic cancer Neg Hx    Rectal  cancer Neg Hx    Stomach cancer Neg Hx     Social History   Socioeconomic History   Marital status: Married    Spouse name: Not on file   Number of children: 1   Years of education: 14   Highest education level: Some college, no degree  Occupational History   Occupation: Retired  Tobacco Use   Smoking status: Never   Smokeless tobacco: Never  Vaping Use   Vaping status: Never Used  Substance and Sexual Activity   Alcohol use: No    Alcohol/week: 0.0 standard drinks of alcohol   Drug use: No   Sexual activity: Not on file  Other Topics Concern   Not on file  Social History Narrative   Unemployed  Married   HH of 2    Has grandchildren out of state   No pets   Sis. died 12-01-11COPD   Ranch home   Right handed   Social Determinants of Health   Financial Resource Strain: Low Risk  (08/25/2021)   Overall Financial Resource Strain (CARDIA)    Difficulty of Paying Living Expenses: Not hard at all  Food Insecurity: No Food Insecurity (01/23/2022)   Hunger Vital Sign    Worried About Running Out of Food in the Last Year: Never true    Ran Out of Food in the Last Year: Never true  Transportation Needs: No Transportation Needs (01/23/2022)   PRAPARE - Administrator, Civil Service (Medical): No    Lack of Transportation (Non-Medical): No  Physical Activity: Inactive (08/25/2021)   Exercise Vital Sign    Days of Exercise per Week: 0 days    Minutes of Exercise per Session: 0 min  Stress: No Stress Concern Present (08/25/2021)   [employer] of Occupational Health - Occupational Stress Questionnaire    Feeling of Stress : Not at all  Social Connections: Socially Integrated (08/25/2021)   Social Connection and Isolation Panel [NHANES]    Frequency of Communication with Friends and Family: More than three times a week    Frequency of Social Gatherings with Friends and Family: More than three times a week    Attends Religious Services: More than 4 times per  year    Active Member of [employer] or Organizations: Yes    Attends Engineer, structural: More than 4 times per year    Marital Status: Married  Catering manager Violence: Not At Risk (01/23/2022)   Humiliation, Afraid, Rape, and Kick questionnaire    Fear of Current or Ex-Partner: No    Emotionally Abused: No    Physically Abused: No    Sexually Abused: No    Review of systems: Review of Systems    Physical Exam: There were no vitals filed for this visit. There is no height or weight on file to calculate BMI. General: well-appearing ***  Eyes: sclera anicteric, no redness ENT: oral mucosa moist without lesions, no cervical or supraclavicular lymphadenopathy CV: RRR, no JVD, no peripheral edema Resp: clear to auscultation bilaterally, normal RR and effort noted GI: soft, no tenderness, with active bowel sounds. No guarding or palpable organomegaly noted. Skin; warm and dry, no rash or jaundice noted Neuro: awake, alert and oriented x 3. Normal gross motor function and fluent speech   Data Reviewed:  Reviewed labs, radiology imaging, old records and pertinent past GI work up   Assessment and Plan/Recommendations:  77 year old very pleasant female with chronic GERD, recurrent hiatal hernia with slipped Nissen and IBS predominant diarrhea   GERD with recurrent hiatal hernia and slipped Nissen:  Persistent GERD symptoms despite Nexium 40 mg daily, switch to pantoprazole 40 mg daily in the morning and add Pepcid 20 mg daily in the evening  Pill and solid dysphagia: Will need to exclude oropharyngeal dysphagia, will obtain modified barium swallow.  Will also request barium esophagram to exclude esophageal stricture Will defer EGD until patient completes workup for frequent falls, she is at risk for potential complications related to anesthesia and procedure   IBS-diarrhea: Use Benefiber 1 tablespoon 3 times daily with meals Use dicyclomine 10 mg up to 3 times daily as  needed Use squatty potty to improve evacuation during defecation If continues to have difficulty evacuating, pelvic floor dysfunction, will  refer to pelvic floor physical therapy for biofeedback Continue IBgard as needed    Return in 2 to 3 months  This visit required 40 minutes of patient care (this includes precharting, chart review, review of results, face-to-face time used for counseling as well as treatment plan and follow-up. The patient was provided an opportunity to ask questions and all were answered. The patient agreed with the plan and demonstrated an understanding of the instructions.  Iona Beard , MD    Ladona Mow Hewitt Shorts as a scribe for Marsa Aris, MD.,have documented all relevant documentation on the behalf of Marsa Aris, MD,as directed by  Marsa Aris, MD while in the presence of Marsa Aris, MD.   I, Marsa Aris, MD, have reviewed all documentation for this visit. The documentation on 09/01/22 for the exam, diagnosis, procedures, and orders are all accurate and complete.   CC: Panosh, Neta Mends, MD

## 2022-09-04 ENCOUNTER — Ambulatory Visit: Payer: Medicare Other | Admitting: Gastroenterology

## 2022-09-04 ENCOUNTER — Encounter: Payer: Self-pay | Admitting: Gastroenterology

## 2022-09-04 VITALS — BP 94/60 | HR 84 | Ht <= 58 in | Wt 93.1 lb

## 2022-09-04 DIAGNOSIS — K2100 Gastro-esophageal reflux disease with esophagitis, without bleeding: Secondary | ICD-10-CM

## 2022-09-04 DIAGNOSIS — R1310 Dysphagia, unspecified: Secondary | ICD-10-CM | POA: Diagnosis not present

## 2022-09-04 DIAGNOSIS — R634 Abnormal weight loss: Secondary | ICD-10-CM | POA: Diagnosis not present

## 2022-09-04 NOTE — Patient Instructions (Signed)
You have been scheduled for an endoscopy. Please follow written instructions given to you at your visit today.  If you use inhalers (even only as needed), please bring them with you on the day of your procedure.  If you take any of the following medications, they will need to be adjusted prior to your procedure:   DO NOT TAKE 7 DAYS PRIOR TO TEST- Trulicity (dulaglutide) Ozempic, Wegovy (semaglutide) Mounjaro (tirzepatide) Bydureon Bcise (exanatide extended release)  DO NOT TAKE 1 DAY PRIOR TO YOUR TEST Rybelsus (semaglutide) Adlyxin (lixisenatide) Victoza (liraglutide) Byetta (exanatide) ___________________________________________________________________________     Eat Small Frequent meals  Use FDgard three times a day as needed   Due to recent changes in healthcare laws, you may see the results of your imaging and laboratory studies on MyChart before your provider has had a chance to review them.  We understand that in some cases there may be results that are confusing or concerning to you. Not all laboratory results come back in the same time frame and the provider may be waiting for multiple results in order to interpret others.  Please give [place] 48 hours in order for your provider to thoroughly review all the results before contacting the office for clarification of your results.    I appreciate the  opportunity to care for you  Thank You   Marsa Aris , MD

## 2022-09-05 ENCOUNTER — Encounter: Payer: Self-pay | Admitting: Gastroenterology

## 2022-09-05 ENCOUNTER — Ambulatory Visit: Payer: Medicare Other | Admitting: Gastroenterology

## 2022-09-05 VITALS — BP 117/61 | HR 89 | Temp 97.3°F | Resp 15 | Ht <= 58 in | Wt 93.0 lb

## 2022-09-05 DIAGNOSIS — R634 Abnormal weight loss: Secondary | ICD-10-CM

## 2022-09-05 DIAGNOSIS — R1310 Dysphagia, unspecified: Secondary | ICD-10-CM

## 2022-09-05 MED ORDER — SODIUM CHLORIDE 0.9 % IV SOLN
500.0000 mL | Freq: Once | INTRAVENOUS | Status: DC
Start: 2022-09-05 — End: 2022-09-05

## 2022-09-05 NOTE — Progress Notes (Signed)
Uneventful anesthetic. Report to pacu rn. Vss. Care resumed by rn. 

## 2022-09-05 NOTE — Progress Notes (Signed)
Please refer to office visit note 09/04/22. No additional changes in H&P Patient is appropriate for planned procedure(s) and anesthesia in an ambulatory setting  K. Scherry Ran , MD [phone]

## 2022-09-05 NOTE — Op Note (Addendum)
[facility] Patient Name: Caitlin [name] Procedure Date: 09/05/2022 2:48 PM MRN: [id number] Endoscopist: Napoleon Form , MD, [id number] Age: 77 Referring MD:  Date of Birth: 1945-07-31 Gender: Female Account #: [id number] Procedure:                Upper GI endoscopy Indications:              Dysphagia, Reflux esophagitis Medicines:                Monitored Anesthesia Care Procedure:                Pre-Anesthesia Assessment:                           - Prior to the procedure, a History and Physical                            was performed, and patient medications and                            allergies were reviewed. The patient's tolerance of                            previous anesthesia was also reviewed. The risks                            and benefits of the procedure and the sedation                            options and risks were discussed with the patient.                            All questions were answered, and informed consent                            was obtained. Prior Anticoagulants: The patient has                            taken no anticoagulant or antiplatelet agents. ASA                            Grade Assessment: III - A patient with severe                            systemic disease. After reviewing the risks and                            benefits, the patient was deemed in satisfactory                            condition to undergo the procedure.                           After obtaining informed consent, the endoscope was  passed under direct vision. Throughout the                            procedure, the patient's blood pressure, pulse, and                            oxygen saturations were monitored continuously. The                            GIF [id number] #[id number] was introduced through the                            mouth, and advanced to the second part of duodenum.                            The upper GI  endoscopy was accomplished without                            difficulty. The patient tolerated the procedure                            well. Scope In: Scope Out: Findings:                 The Z-line was regular and was found 33 cm from the                            incisors.                           The lumen of the lower third of the esophagus was                            moderately dilated.                           A 5 cm hiatal hernia was present.                           Evidence of a Nissen fundoplication was found in                            the lower third of the esophagus. The wrap appeared                            intact, but slipped proximally. This was traversed.                            A TTS dilator was passed through the scope.                            Dilation with a 16-17-18 mm balloon dilator was                            performed to  18 mm. The dilation site was examined                            following endoscope reinsertion and showed no                            change.                           The exam of the stomach was otherwise normal.                           The examined duodenum was normal. Complications:            No immediate complications. Estimated Blood Loss:     Estimated blood loss was minimal. Impression:               - Z-line regular, 33 cm from the incisors.                           - Dilation in the lower third of the esophagus.                           - 5 cm hiatal hernia.                           - A Nissen fundoplication was found. The wrap                            appears intact. Dilated.                           - Normal examined duodenum.                           - No specimens collected. Recommendation:           - Patient has a contact number available for                            emergencies. The signs and symptoms of potential                            delayed complications were discussed with the                             patient. Return to normal activities tomorrow.                            Written discharge instructions were provided to the                            patient.                           - Resume previous diet.                           -  Continue present medications.                           - Follow an antireflux regimen.                           - Benefiber 1 tablespoon TID with meals                           - Follow up in GI office Dr.Oretta Berkland, next                            available appointment in 3-4 months Napoleon Form, MD 09/05/2022 3:30:19 PM This report has been signed electronically.

## 2022-09-05 NOTE — Patient Instructions (Signed)
Discharge instructions given. Handout on a Dilatation diet. Resume previous medications. YOU HAD AN ENDOSCOPIC PROCEDURE TODAY AT [facility]:   Refer to the procedure report that was given to you for any specific questions about what was found during the examination.  If the procedure report does not answer your questions, please call your gastroenterologist to clarify.  If you requested that your care partner not be given the details of your procedure findings, then the procedure report has been included in a sealed envelope for you to review at your convenience later.  YOU SHOULD EXPECT: Some feelings of bloating in the abdomen. Passage of more gas than usual.  Walking can help get rid of the air that was put into your GI tract during the procedure and reduce the bloating. If you had a lower endoscopy (such as a colonoscopy or flexible sigmoidoscopy) you may notice spotting of blood in your stool or on the toilet paper. If you underwent a bowel prep for your procedure, you may not have a normal bowel movement for a few days.  Please Note:  You might notice some irritation and congestion in your nose or some drainage.  This is from the oxygen used during your procedure.  There is no need for concern and it should clear up in a day or so.  SYMPTOMS TO REPORT IMMEDIATELY:   Following upper endoscopy (EGD)  Vomiting of blood or coffee ground material  New chest pain or pain under the shoulder blades  Painful or persistently difficult swallowing  New shortness of breath  Fever of 100F or higher  Black, tarry-looking stools  For urgent or emergent issues, a gastroenterologist can be reached at any hour by calling [phone]. Do not use MyChart messaging for urgent concerns.    DIET:  We do recommend a small meal at first, but then you may proceed to your regular diet.  Drink plenty of fluids but you should avoid alcoholic beverages for 24 hours.  ACTIVITY:  You should  plan to take it easy for the rest of today and you should NOT DRIVE or use heavy machinery until tomorrow (because of the sedation medicines used during the test).    FOLLOW UP: Our staff will call the number listed on your records the next business day following your procedure.  We will call around 7:15- 8:00 am to check on you and address any questions or concerns that you may have regarding the information given to you following your procedure. If we do not reach you, we will leave a message.     If any biopsies were taken you will be contacted by phone or by letter within the next 1-3 weeks.  Please call us at [phone] if you have not heard about the biopsies in 3 weeks.    SIGNATURES/CONFIDENTIALITY: You and/or your care partner have signed paperwork which will be entered into your electronic medical record.  These signatures attest to the fact that that the information above on your After Visit Summary has been reviewed and is understood.  Full responsibility of the confidentiality of this discharge information lies with you and/or your care-partner.

## 2022-09-05 NOTE — Progress Notes (Signed)
Called to room to assist during endoscopic procedure.  Patient ID and intended procedure confirmed with present staff. Received instructions for my participation in the procedure from the performing physician.  

## 2022-09-06 ENCOUNTER — Telehealth: Payer: Self-pay

## 2022-09-06 NOTE — Telephone Encounter (Signed)
Follow up call to pt, lm for pt to call if having any difficulty with normal activities or eating and drinking.  Also to call if any other questions or concerns.  

## 2022-09-28 ENCOUNTER — Encounter: Payer: Self-pay | Admitting: Internal Medicine

## 2022-09-28 ENCOUNTER — Ambulatory Visit: Payer: Medicare Other | Admitting: Internal Medicine

## 2022-09-28 VITALS — BP 134/74 | HR 74 | Temp 97.7°F | Ht <= 58 in | Wt 94.0 lb

## 2022-09-28 DIAGNOSIS — Z0000 Encounter for general adult medical examination without abnormal findings: Secondary | ICD-10-CM | POA: Diagnosis not present

## 2022-09-28 NOTE — Progress Notes (Unsigned)
Medicare Annual Preventive Care Visit  (initial annual wellness or annual wellness exam)  Concerns and/or follow up today: here with spouse   See HM section in Epic for other details of completed HM. See scanned documentation under Media Tab for further documentation HPI, health risk assessment. See Media Tab and Care Teams sections in Epic for other providers.  ROS:  mobility a bit better after PT   memory some better with medicaiton  gerd about the same   1.) Patient-completed health risk assessment  - completed and reviewed, see scanned documentation  2.) Review of Medical History: -PMH, PSH, Family History and current specialty and care providers reviewed and updated and listed below  - see scanned in document in chart and below  Past Medical History:  Diagnosis Date   Allergic rhinitis 07/11/2006   Back pain 10/09/2007   Blood transfusion without reported diagnosis    Diaphragmatic hernia 07/11/2006   Diverticulosis of colon    Eczema 07/11/2006   Elevated cholesterol 04/29/2012   Enteritis    Erythema nodosum    Tends to be seasonal has had a negative chest x-ray in the past negative PPD   Essential hypertension 07/11/2006   Fatigue 03/12/2007   Fibromyalgia    Gastroparesis 12/23/2008   Generalized anxiety disorder 09/01/2014   GERD (gastroesophageal reflux disease)    History of stricture and dilatation and Nissen surgery   Hearing loss, left ear 11/27/2008   History of cataract    bilateral; s/p surgery   Hyperglycemia 08/15/2006   Irritable bowel syndrome 12/23/2008   Leg cramps, nocturnal 11/11/2007   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   MCI (mild cognitive impairment) 10/22/2018   Obstructive sleep apnea 04/23/2007   uses CPAP nightly; mask has leak   Osteoarthritis 11/28/2011   Osteopenia    Prediabetes 04/19/2011   Vitamin B12 deficiency 05/13/2010    Past Surgical History:  Procedure Laterality Date   ABDOMINAL HYSTERECTOMY     bone spur      CATARACT EXTRACTION W/ INTRAOCULAR LENS  IMPLANT, BILATERAL     CESAREAN SECTION     x 1   CHOLECYSTECTOMY     COLONOSCOPY     DENTAL SURGERY     ESOPHAGOGASTRODUODENOSCOPY     stricture and dilatation   IR KYPHO EA ADDL LEVEL THORACIC OR LUMBAR  04/28/2022   IR KYPHO THORACIC WITH BONE BIOPSY  04/28/2022   IR RADIOLOGIST EVAL & MGMT  04/20/2022   IR RADIOLOGIST EVAL & MGMT  05/19/2022   left shoulder surgery  06/08, 07/12/17   x 2   MOUTH SURGERY Right 06/03/2019   NISSEN FUNDOPLICATION     ROTATOR CUFF REPAIR Left    rt knee surgery     scope   UPPER GASTROINTESTINAL ENDOSCOPY      Social History   Socioeconomic History   Marital status: Married    Spouse name: Not on file   Number of children: 1   Years of education: 14   Highest education level: Some college, no degree  Occupational History   Occupation: Retired  Tobacco Use   Smoking status: Never   Smokeless tobacco: Never  Vaping Use   Vaping status: Never Used  Substance and Sexual Activity   Alcohol use: No    Alcohol/week: 0.0 standard drinks of alcohol   Drug use: No   Sexual activity: Not Currently  Other Topics Concern   Not on file  Social History Narrative   Unemployed   Married  HH of 2    Has grandchildren out of state   No pets   Sis. died Dec 11, 2011COPD   Ranch home   Right handed   Social Determinants of Health   Financial Resource Strain: Low Risk  (09/28/2022)   Overall Financial Resource Strain (CARDIA)    Difficulty of Paying Living Expenses: Not hard at all  Food Insecurity: No Food Insecurity (01/23/2022)   Hunger Vital Sign    Worried About Running Out of Food in the Last Year: Never true    Ran Out of Food in the Last Year: Never true  Transportation Needs: No Transportation Needs (01/23/2022)   PRAPARE - Administrator, Civil Service (Medical): No    Lack of Transportation (Non-Medical): No  Physical Activity: Sufficiently Active (09/28/2022)   Exercise Vital Sign     Days of Exercise per Week: 7 days    Minutes of Exercise per Session: 30 min  Stress: Stress Concern Present (09/28/2022)   [employer] of Occupational Health - Occupational Stress Questionnaire    Feeling of Stress : Very much  Social Connections: Moderately Isolated (09/28/2022)   Social Connection and Isolation Panel [NHANES]    Frequency of Communication with Friends and Family: Three times a week    Frequency of Social Gatherings with Friends and Family: Never    Attends Religious Services: Never    Database administrator or Organizations: No    Attends Banker Meetings: Never    Marital Status: Married  Catering manager Violence: Not At Risk (01/23/2022)   Humiliation, Afraid, Rape, and Kick questionnaire    Fear of Current or Ex-Partner: No    Emotionally Abused: No    Physically Abused: No    Sexually Abused: No    Family History  Problem Relation Age of Onset   Depression Mother    Kidney cancer Mother    Dementia Mother        Unspecified, likely secondary to other medical condition (cancer)   Depression Father    Hiatal hernia Father    COPD Sister    Dementia Sister        Unspecified, likely secondary to other medical condition   Dementia Brother    Heart Problems Brother    Nephrolithiasis Other    Colon cancer Neg Hx    Esophageal cancer Neg Hx    Pancreatic cancer Neg Hx    Rectal cancer Neg Hx    Stomach cancer Neg Hx     Current Outpatient Medications on File Prior to Visit  Medication Sig Dispense Refill   acetaminophen (TYLENOL) 325 MG tablet Take 2 tablets (650 mg total) by mouth every 6 (six) hours as needed for mild pain (or Fever >/= 101).     buPROPion (WELLBUTRIN XL) 150 MG 24 hr tablet Take 1 tablet by mouth once daily 90 tablet 0   Cholecalciferol (VITAMIN D3) 1000 units CAPS Take 1,000 Units by mouth daily.     Cyanocobalamin (VITAMIN B-12) 1000 MCG SUBL Place 2,000 mcg under the tongue. Every other day     Docusate  Calcium (STOOL SOFTENER PO) Take by mouth. Every other day     escitalopram (LEXAPRO) 10 MG tablet TAKE 1 & 1/2 (ONE & ONE-HALF) TABLETS BY MOUTH ONCE DAILY 135 tablet 0   famotidine (PEPCID) 40 MG tablet Take 1 tablet by mouth once daily 90 tablet 0   FIBER PO Take by mouth daily. Benefiber powder  pantoprazole (PROTONIX) 40 MG tablet Take 1 tablet (40 mg total) by mouth daily. 90 tablet 3   potassium chloride (KLOR-CON) 10 MEQ tablet TAKE 1 TABLET BY MOUTH EVERY OTHER DAY WITH  FLUID  TABLET 30 tablet 0   SIMPLY SALINE 0.9 % AERS Place 1 spray into both nostrils in the morning and at bedtime.     Multiple Vitamins-Minerals (PRESERVISION AREDS 2) CAPS Take 1 capsule by mouth 2 (two) times a week.     No current facility-administered medications on file prior to visit.     3.) Review of functional ability and level of safety:  Any difficulty hearing?  See scanned documentation  History of falling?  See scanned documentation yes   but not a lot recnetly and related to walker being away from position   Any trouble with IADLs - using a phone, using transportation, grocery shopping, preparing meals, doing housework, doing laundry, taking medications and managing money?  See scanned documentation  Advance Directives?  Discussed briefly and offered more resources and detailed discussion with our trained staff.   See summary of recommendations in Patient Instructions below.  4.) Physical Exam  Vitals:   09/28/22 1050  BP: 134/74  Pulse: 74  Temp: 97.7 F (36.5 C)  SpO2: 94%   Estimated body mass index is 19.65 kg/m as calculated from the following:   Height as of this encounter: 4\' 10"  (1.473 m).   Weight as of this encounter: 94 lb (42.6 kg).  EKG (optional): deferred  General: alert, appear well hydrated and in no acute distress here with spouse   HEENT: visual acuity grossly intact  CV: HRRR  Lungs: CTA bilaterally  Psych: pleasant and cooperative,    Cogn  function nl for conversation  Mobility with roller walker  See patient instructions for recommendations.  Education and counseling regarding the above review of health provided with a plan for the following: -see scanned patient completed form for further details -fall prevention strategies discussed  -healthy lifestyle discussed -importance and resources for completing advanced directives discussed -see patient instructions below for any other recommendations provided  4)The following written screening schedule of preventive measures were reviewed with assessment and plan made per below, orders and patient instructions:      AAA screening done if applicable     Alcohol screening done     Obesity Screening and counseling done     STI screening (Hep C if born 80-65) offered and per pt wishes     Tobacco Screening done done       Pneumococcal (PPSV23 -one dose after 65, one before if risk factors), influenza yearly and hepatitis B vaccines (if high risk - end stage renal disease, IV drugs, homosexual men, live in home for mentally retarded, hemophilia receiving factors) ASSESSMENT/PLAN: done if applicable      Screening mammograph (yearly if >40) ASSESSMENT/PLAN: utd or ordered      Screening Pap smear/pelvic exam (q2 years) ASSESSMENT/PLAN: n/a, declined      Prostate cancer screening ASSESSMENT/PLAN: n/a,       Colorectal cancer screening (FOBT yearly or flex sig q4y or colonoscopy q10y or barium enema q4y) ASSESSMENT/PLAN: utd or ordered      Diabetes outpatient self-management training services ASSESSMENT/PLAN: utd or done      Bone mass measurements(covered q2y if indicated - estrogen def, osteoporosis, hyperparathyroid, vertebral abnormalities, osteoporosis or steroids) ASSESSMENT/PLAN: utd or discussed and ordered per pt wishes      Screening for glaucoma(q1y if high  risk - diabetes, FH, AA and > 50 or hispanic and > 65) ASSESSMENT/PLAN: utd or advised      Medical  nutritional therapy for individuals with diabetes or renal disease ASSESSMENT/PLAN: see orders      Cardiovascular screening blood tests (lipids q5y) ASSESSMENT/PLAN: see orders and labs      Diabetes screening tests ASSESSMENT/PLAN: see orders and labs   7.) Summary: -risk factors and conditions per above assessment were discussed and treatment, recommendations and referrals were offered per documentation above and orders and patient instructions.  Encounter for Medicare annual wellness exam  Patient Instructions  Fu 4-6 months  Go ahead with RSV vaccine. Contnue  mobility with fall  precautions   Ms. Con Memos , Thank you for taking time to come for your Medicare Wellness Visit. I appreciate your ongoing commitment to your health goals. Please review the following plan we discussed and let me know if I can assist you in the future.   These are the goals we discussed:  Goals       Exercise     Exercise 150 minutes per week (moderate activity)      Walking longer distances would assist you to go [place] with your grandson IBS is limiting; has a fitness center in the neighborhood  Will try to go x2 days       General     Patient Stated      Would like to be able to get outside more       Stay Healthy (pt-stated)      To be stronger.       Weight     physically strong      Pt reports wants to get through the day. Spouse added to get physically strong and improve with memories.         This is a list of the screening recommended for you and due dates:  Health Maintenance  Topic Date Due   DTaP/Tdap/Td vaccine (2 - Td or Tdap) 10/26/2021   Flu Shot  08/17/2022   COVID-19 Vaccine (6 - 2023-24 season) 09/17/2022   Mammogram  11/19/2022   Medicare Annual Wellness Visit  09/28/2023   Pneumonia Vaccine  Completed   DEXA scan (bone density measurement)  Completed   Hepatitis C Screening  Completed   Zoster (Shingles) Vaccine  Completed   HPV Vaccine  Aged Out    Colon Cancer Screening  Discontinued     Berniece Andreas, MD    Wt Readings from Last 3 Encounters:  10/17/22 95 lb (43.1 kg)  09/28/22 94 lb (42.6 kg)  09/05/22 93 lb (42.2 kg)   Flu shot today Can look into  RSV

## 2022-09-28 NOTE — Patient Instructions (Addendum)
Fu 4-6 months  Go ahead with RSV vaccine. Contnue  mobility with fall  precautions   Caitlin [name] , Thank you for taking time to come for your Medicare Wellness Visit. I appreciate your ongoing commitment to your health goals. Please review the following plan we discussed and let me know if I can assist you in the future.   These are the goals we discussed:  Goals       Exercise     Exercise 150 minutes per week (moderate activity)      Walking longer distances would assist you to go [place] with your grandson IBS is limiting; has a fitness center in the neighborhood  Will try to go x2 days       General     Patient Stated      Would like to be able to get outside more       Stay Healthy (pt-stated)      To be stronger.       Weight     physically strong      Pt reports wants to get through the day. Spouse added to get physically strong and improve with memories.         This is a list of the screening recommended for you and due dates:  Health Maintenance  Topic Date Due   DTaP/Tdap/Td vaccine (2 - Td or Tdap) 10/26/2021   Flu Shot  08/17/2022   COVID-19 Vaccine (6 - 2023-24 season) 09/17/2022   Mammogram  11/19/2022   Medicare Annual Wellness Visit  09/28/2023   Pneumonia Vaccine  Completed   DEXA scan (bone density measurement)  Completed   Hepatitis C Screening  Completed   Zoster (Shingles) Vaccine  Completed   HPV Vaccine  Aged Out   Colon Cancer Screening  Discontinued

## 2022-09-28 NOTE — Progress Notes (Unsigned)
Subjective:   Caitlin [name] is a 77 y.o. female who presents for Medicare Annual (Subsequent) preventive examination.  Visit Complete: In person  Patient Medicare AWV questionnaire was completed by the patient on 09/28/2022; I have confirmed that all information answered by patient is correct and no changes since this date.  Review of Systems     Cardiac Risk Factors include: none     Objective:    Today's Vitals   09/28/22 1050  BP: 134/74  Pulse: 74  Temp: 97.7 F (36.5 C)  TempSrc: Oral  SpO2: 94%  Weight: 94 lb (42.6 kg)  Height: 4\' 10"  (1.473 m)   Body mass index is 19.65 kg/m.     07/28/2022    1:25 PM 07/06/2022   12:39 PM 01/23/2022    7:05 PM 01/23/2022   10:30 AM 08/25/2021   11:42 AM 01/24/2021    8:33 PM 01/19/2021    2:48 PM  Advanced Directives  Does Patient Have a Medical Advance Directive? Yes Yes Yes No;Yes Yes Yes Yes  Type of Diplomatic Services operational officer Living will;Healthcare Power of Attorney Living will  Healthcare Power of [place];Living will Healthcare Power of [employer] of [place];Living will  Does patient want to make changes to medical advance directive? No - Patient declined No - Patient declined No - Patient declined No - Patient declined No - Patient declined    Copy of Healthcare Power of Attorney in Chart? No - copy requested No - copy requested   No - copy requested      Current Medications (verified) Outpatient Encounter Medications as of 09/28/2022  Medication Sig   acetaminophen (TYLENOL) 325 MG tablet Take 2 tablets (650 mg total) by mouth every 6 (six) hours as needed for mild pain (or Fever >/= 101).   buPROPion (WELLBUTRIN XL) 150 MG 24 hr tablet Take 1 tablet by mouth once daily   Cholecalciferol (VITAMIN D3) 1000 units CAPS Take 1,000 Units by mouth daily.   Cyanocobalamin (VITAMIN B-12) 1000 MCG SUBL Place 2,000 mcg under the tongue. Every other day   Docusate Calcium (STOOL SOFTENER PO) Take by  mouth. Every other day   escitalopram (LEXAPRO) 10 MG tablet TAKE 1 & 1/2 (ONE & ONE-HALF) TABLETS BY MOUTH ONCE DAILY   famotidine (PEPCID) 40 MG tablet Take 1 tablet by mouth once daily   FIBER PO Take by mouth daily. Benefiber powder   memantine (NAMENDA) 5 MG tablet Take 1 tablet at night   pantoprazole (PROTONIX) 40 MG tablet Take 1 tablet (40 mg total) by mouth daily.   potassium chloride (KLOR-CON) 10 MEQ tablet TAKE 1 TABLET BY MOUTH EVERY OTHER DAY WITH  FLUID  TABLET   SIMPLY SALINE 0.9 % AERS Place 1 spray into both nostrils in the morning and at bedtime.   Multiple Vitamins-Minerals (PRESERVISION AREDS 2) CAPS Take 1 capsule by mouth 2 (two) times a week. (Patient not taking: Reported on 09/05/2022)   No facility-administered encounter medications on file as of 09/28/2022.    Allergies (verified) Codeine phosphate, Mobic [meloxicam], Protonix [pantoprazole sodium], Ibuprofen, Peppermint oil, Aleve [naproxen], Colestipol hcl, Omeprazole-sodium bicarbonate, Sodium, and Sulfamethoxazole   History: Past Medical History:  Diagnosis Date   Allergic rhinitis 07/11/2006   Back pain 10/09/2007   Blood transfusion without reported diagnosis    Diaphragmatic hernia 07/11/2006   Diverticulosis of colon    Eczema 07/11/2006   Elevated cholesterol 04/29/2012   Enteritis    Erythema nodosum  Tends to be seasonal has had a negative chest x-ray in the past negative PPD   Essential hypertension 07/11/2006   Fatigue 03/12/2007   Fibromyalgia    Gastroparesis 12/23/2008   Generalized anxiety disorder 09/01/2014   GERD (gastroesophageal reflux disease)    History of stricture and dilatation and Nissen surgery   Hearing loss, left ear 11/27/2008   History of cataract    bilateral; s/p surgery   Hyperglycemia 08/15/2006   Irritable bowel syndrome 12/23/2008   Leg cramps, nocturnal 11/11/2007   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   MCI (mild cognitive impairment) 10/22/2018    Obstructive sleep apnea 04/23/2007   uses CPAP nightly; mask has leak   Osteoarthritis 11/28/2011   Osteopenia    Prediabetes 04/19/2011   Vitamin B12 deficiency 05/13/2010   Past Surgical History:  Procedure Laterality Date   ABDOMINAL HYSTERECTOMY     bone spur     CATARACT EXTRACTION W/ INTRAOCULAR LENS  IMPLANT, BILATERAL     CESAREAN SECTION     x 1   CHOLECYSTECTOMY     COLONOSCOPY     DENTAL SURGERY     ESOPHAGOGASTRODUODENOSCOPY     stricture and dilatation   IR KYPHO EA ADDL LEVEL THORACIC OR LUMBAR  04/28/2022   IR KYPHO THORACIC WITH BONE BIOPSY  04/28/2022   IR RADIOLOGIST EVAL & MGMT  04/20/2022   IR RADIOLOGIST EVAL & MGMT  05/19/2022   left shoulder surgery  06/08, 07/12/17   x 2   MOUTH SURGERY Right 06/03/2019   NISSEN FUNDOPLICATION     ROTATOR CUFF REPAIR Left    rt knee surgery     scope   UPPER GASTROINTESTINAL ENDOSCOPY     Family History  Problem Relation Age of Onset   Depression Mother    Kidney cancer Mother    Dementia Mother        Unspecified, likely secondary to other medical condition (cancer)   Depression Father    Hiatal hernia Father    COPD Sister    Dementia Sister        Unspecified, likely secondary to other medical condition   Dementia Brother    Heart Problems Brother    Nephrolithiasis Other    Colon cancer Neg Hx    Esophageal cancer Neg Hx    Pancreatic cancer Neg Hx    Rectal cancer Neg Hx    Stomach cancer Neg Hx    Social History   Socioeconomic History   Marital status: Married    Spouse name: Not on file   Number of children: 1   Years of education: 14   Highest education level: Some college, no degree  Occupational History   Occupation: Retired  Tobacco Use   Smoking status: Never   Smokeless tobacco: Never  Vaping Use   Vaping status: Never Used  Substance and Sexual Activity   Alcohol use: No    Alcohol/week: 0.0 standard drinks of alcohol   Drug use: No   Sexual activity: Not Currently  Other Topics  Concern   Not on file  Social History Narrative   Unemployed   Married   HH of 2    Has grandchildren out of state   No pets   Sis. died 2011-11-25COPD   Ranch home   Right handed   Social Determinants of Health   Financial Resource Strain: Low Risk  (09/28/2022)   Overall Financial Resource Strain (CARDIA)    Difficulty of Paying Living Expenses:  Not hard at all  Food Insecurity: No Food Insecurity (01/23/2022)   Hunger Vital Sign    Worried About Running Out of Food in the Last Year: Never true    Ran Out of Food in the Last Year: Never true  Transportation Needs: No Transportation Needs (01/23/2022)   PRAPARE - Administrator, Civil Service (Medical): No    Lack of Transportation (Non-Medical): No  Physical Activity: Sufficiently Active (09/28/2022)   Exercise Vital Sign    Days of Exercise per Week: 7 days    Minutes of Exercise per Session: 30 min  Stress: Stress Concern Present (09/28/2022)   [employer] of Occupational Health - Occupational Stress Questionnaire    Feeling of Stress : Very much  Social Connections: Moderately Isolated (09/28/2022)   Social Connection and Isolation Panel [NHANES]    Frequency of Communication with Friends and Family: Three times a week    Frequency of Social Gatherings with Friends and Family: Never    Attends Religious Services: Never    Database administrator or Organizations: No    Attends Engineer, structural: Never    Marital Status: Married    Tobacco Counseling Counseling given: Not Answered   Clinical Intake:  Pre-visit preparation completed: No  Pain : No/denies pain     BMI - recorded: 19.65 Nutritional Status: BMI of 19-24  Normal Nutritional Risks: Unintentional weight loss Diabetes: No  How often do you need to have someone help you when you read instructions, pamphlets, or other written materials from your doctor or pharmacy?: 3 - Sometimes (spouse states pt reads and writes real well  but don't retain the information.)  Interpreter Needed?: No      Activities of Daily Living    09/28/2022   11:01 AM 01/23/2022    7:08 PM  In your present state of health, do you have any difficulty performing the following activities:  Hearing? 1   Vision? 1   Comment pt reports Right eye is giving her most trouble   Difficulty concentrating or making decisions? 1   Walking or climbing stairs? 1   Comment pt use walker   Dressing or bathing? 0   Doing errands, shopping? 1 0  Preparing Food and eating ? Y   Comment pt has difficulty with swallow.   Using the Toilet? N   In the past six months, have you accidently leaked urine? Y   Do you have problems with loss of bowel control? N   Managing your Medications? Y   Managing your Finances? Y   Housekeeping or managing your Housekeeping? Y     Patient Care Team: Panosh, Neta Mends, MD as PCP - Pervis Hocking, MD as Consulting Physician (Dermatology) Mateo Flow, MD as Attending Physician (Ophthalmology) Bufford Buttner, MD as Consulting Physician (Dermatology) Van Clines, MD as Consulting Physician (Neurology) Tomma Lightning, MD as Consulting Physician (Pulmonary Disease) Elwyn Reach (Neurology) Napoleon Form, MD as Consulting Physician (Gastroenterology)  Indicate any recent Medical Services you may have received from other than [facility] providers in the past year (date may be approximate).     Assessment:   This is a routine wellness examination for [place].  Hearing/Vision screen Vision Screening   Right eye Left eye Both eyes  Without correction     With correction 20/25 20/25 20/25      Goals Addressed             This Visit's Progress  physically strong       Pt reports wants to get through the day. Spouse added to get physically strong and improve with memories.        Depression Screen    09/28/2022   11:22 AM 07/25/2022   11:30 AM 04/19/2022    3:58 PM 10/26/2021     4:01 PM 08/25/2021   11:37 AM 05/24/2021    2:18 PM 08/18/2020    2:51 PM  PHQ 2/9 Scores  PHQ - 2 Score 6 2 5 6  0 6 0  PHQ- 9 Score 19 12 22 17  23      Fall Risk    09/28/2022   11:19 AM 07/28/2022    1:25 PM 04/19/2022    2:51 PM 08/25/2021   11:41 AM 06/06/2021    2:59 PM  Fall Risk   Falls in the past year? 1 1 1  0 1  Number falls in past yr: 1 1 0 0 0  Injury with Fall? 1 1 1  0 1  Risk for fall due to : Other (Comment) History of fall(s) Other (Comment) No Fall Risks No Fall Risks  Follow up Falls evaluation completed Falls evaluation completed Falls evaluation completed  Falls evaluation completed    MEDICARE RISK AT HOME: Medicare Risk at Home Any stairs in or around the home?: No If so, are there any without handrails?: No Home free of loose throw rugs in walkways, pet beds, electrical cords, etc?: Yes Adequate lighting in your home to reduce risk of falls?: Yes Life alert?: No Use of a cane, walker or w/c?: Yes Grab bars in the bathroom?: Yes Shower chair or bench in shower?: Yes Elevated toilet seat or a handicapped toilet?: Yes (spouse states toilet was too high pt, got a regular toilet for right height for pt.)  TIMED UP AND GO:  Was the test performed?  Yes  Length of time to ambulate 10 feet:  sec Gait slow and steady with assistive device    Cognitive Function:    07/28/2022    3:00 PM 04/04/2020    6:00 PM 03/12/2018    3:00 PM 10/23/2017    2:50 PM 03/12/2017    3:00 PM  MMSE - Mini Mental State Exam  Not completed:    --   Orientation to time 1 5 5  5   Orientation to Place 5 5 5  5   Registration 3 3 3  3   Attention/ Calculation 0 3 3  3   Recall 0 3 3  3   Language- name 2 objects 1 2 2  2   Language- repeat 1 1 1  1   Language- follow 3 step command 3 3 3  3   Language- read & follow direction 1 1 1  1   Write a sentence 0 1 1  1   Copy design 0 1 1  1   Total score 15 28 28  28         09/28/2022   11:26 AM 08/25/2021   11:42 AM 08/28/2019    3:50 PM   6CIT Screen  What Year? 0 points 0 points 0 points  What month? 0 points 0 points 0 points  What time? 3 points 0 points 0 points  Count back from 20 0 points 0 points 0 points  Months in reverse 4 points 0 points 0 points  Repeat phrase 10 points 0 points 0 points  Total Score 17 points 0 points 0 points    Immunizations Immunization History  Administered  Date(s) Administered   Covid-19, Mrna,Vaccine(Spikevax)64yrs and older 11/14/2021   Fluad Quad(high Dose 65+) 10/30/2020   H1N1 03/09/2008   Influenza Split 11/08/2010, 10/17/2011   Influenza Whole 11/14/2006, 10/09/2007, 10/21/2008, 10/19/2009   Influenza, High Dose Seasonal PF 10/06/2014, 10/01/2015, 10/05/2016, 10/12/2017, 10/03/2018, 11/01/2021   Influenza,inj,Quad PF,6+ Mos 10/11/2012, 10/14/2013   Influenza-Unspecified 10/01/2018   PFIZER(Purple Top)SARS-COV-2 Vaccination 03/01/2019, 03/24/2019, 06/21/2020   PPD Test 06/05/2012   Pfizer Covid-19 Vaccine Bivalent Booster 41yrs & up 10/19/2020   Pneumococcal Conjugate-13 11/05/2012   Pneumococcal Polysaccharide-23 03/09/2008, 10/06/2014   Tdap 10/27/2011   Zoster Recombinant(Shingrix) 10/17/2017, 01/02/2018   Zoster, Live 08/08/2010    TDAP status: Patient wishes to receive this vaccine today after disclosing that insurance does not cover the vaccine as a preventative vaccine.   Flu Vaccine status: Completed at today's visit  Pneumococcal vaccine status: Up to date  Covid-19 vaccine status: Completed vaccines  Qualifies for Shingles Vaccine? Yes   Zostavax completed Yes   Shingrix Completed?: Yes  Screening Tests Health Maintenance  Topic Date Due   DTaP/Tdap/Td (2 - Td or Tdap) 10/26/2021   INFLUENZA VACCINE  08/17/2022   COVID-19 Vaccine (6 - 2023-24 season) 09/17/2022   MAMMOGRAM  11/19/2022   Medicare Annual Wellness (AWV)  09/28/2023   Pneumonia Vaccine 29+ Years old  Completed   DEXA SCAN  Completed   Hepatitis C Screening  Completed   Zoster  Vaccines- Shingrix  Completed   HPV VACCINES  Aged Out   Colonoscopy  Discontinued    Health Maintenance  Health Maintenance Due  Topic Date Due   DTaP/Tdap/Td (2 - Td or Tdap) 10/26/2021   INFLUENZA VACCINE  08/17/2022   COVID-19 Vaccine (6 - 2023-24 season) 09/17/2022    {Colorectal cancer screening:2101809}  Mammogram status: Completed 11/18/2021. Repeat every year  Bone Density status: Completed 12/27/2017. Results reflect: Bone density results: OSTEOPOROSIS. Repeat every 2 years.  Lung Cancer Screening: (Low Dose CT Chest recommended if Age 71-80 years, 20 pack-year currently smoking OR have quit w/in 15years.) does not qualify.   Lung Cancer Screening Referral:   Additional Screening:  Hepatitis C Screening: {DOES NOT does:27190::"does not"} qualify; Completed ***  Vision Screening: Recommended annual ophthalmology exams for early detection of glaucoma and other disorders of the eye. Is the patient up to date with their annual eye exam?  {YES/NO:21197} Who is the provider or what is the name of the office in which the patient attends annual eye exams? *** If pt is not established with a provider, would they like to be referred to a provider to establish care? {YES/NO:21197}.   Dental Screening: Recommended annual dental exams for proper oral hygiene  Diabetic Foot Exam: N/A  Community Resource Referral / Chronic Care Management: CRR required this visit?  {YES/NO:21197}  CCM required this visit?  {CCM Required [phone]}     Plan:     I have personally reviewed and noted the following in the patient's chart:   Medical and social history Use of alcohol, tobacco or illicit drugs  Current medications and supplements including opioid prescriptions. {Opioid [phone]} Functional ability and status Nutritional status Physical activity Advanced directives List of other physicians Hospitalizations, surgeries, and ER visits in previous 12  months Vitals Screenings to include cognitive, depression, and falls Referrals and appointments  In addition, I have reviewed and discussed with patient certain preventive protocols, quality metrics, and best practice recommendations. A written personalized care plan for preventive services as well as general preventive health recommendations were provided  to patient.     09/28/2022   After Visit Summary: {CHL AMB AWV After Visit [phone]}  Nurse Notes: ***

## 2022-10-17 ENCOUNTER — Encounter: Payer: Self-pay | Admitting: Internal Medicine

## 2022-10-17 ENCOUNTER — Encounter: Payer: Self-pay | Admitting: Physician Assistant

## 2022-10-17 ENCOUNTER — Ambulatory Visit (INDEPENDENT_AMBULATORY_CARE_PROVIDER_SITE_OTHER): Payer: Medicare Other | Admitting: Physician Assistant

## 2022-10-17 VITALS — BP 143/77 | HR 77 | Resp 20 | Ht <= 58 in | Wt 95.0 lb

## 2022-10-17 DIAGNOSIS — G4733 Obstructive sleep apnea (adult) (pediatric): Secondary | ICD-10-CM

## 2022-10-17 DIAGNOSIS — G309 Alzheimer's disease, unspecified: Secondary | ICD-10-CM

## 2022-10-17 DIAGNOSIS — F0280 Dementia in other diseases classified elsewhere without behavioral disturbance: Secondary | ICD-10-CM

## 2022-10-17 DIAGNOSIS — R413 Other amnesia: Secondary | ICD-10-CM | POA: Diagnosis not present

## 2022-10-17 MED ORDER — MEMANTINE HCL 10 MG PO TABS: 10.0000 mg | ORAL_TABLET | Freq: Two times a day (BID) | ORAL | 11 refills | Status: DC

## 2022-10-17 NOTE — Progress Notes (Signed)
Assessment/Plan:   Dementia likely due to Alzheimer's disease    Caitlin [name] is a delightful 77 y.o. RH female with a history of hypertension, hyperlipidemia, fibromyalgia with chronic pain, history of eczema, GERD, IBS, OSA on BiPAP, osteoarthritis, prediabetes, B12 deficiency, and a diagnosis of MCI multifactorial in nature, dating back to 2016  by Neurological evaluation at our office, and in 2022 by Neuropsychological evaluation in June 2022, not seen since 11/2020, seen today for discussion of imaging results. 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   This patient is accompanied in the office by her daughter and her husband  who supplements the history.  Previous records as well as any outside records available were reviewed prior to todays visit. Patient was last seen on 07/28/22 with MMSE 15/30  She is currently on memantine 5 mg bid, tolerating well.    Follow up in  6 months. Increase memantine to 10 mg bid, side effects discussed  Follow up Pulmonary for OSA. Patient unable to fit correctly on CPAP. For now she is using a BiPAP. This is followed medically  Recommend good control of her cardiovascular risk factors Recommend repeat neuropsych evaluation for diagnostic clarity. Continue to control mood as per PCP     Initial visit 07/28/22  How long did patient have memory difficulties? " After falling on Dec 1, she went downhill quickly"-husband says. " She had anesthesia for ventral hernia and her husband feels that it has contributed to symptoms.  Patient has difficulty remembering recent conversations and people names or new information . She cannot change channels or use the phone, numbers are difficult to her . She needs more help with her ADLS.  repeats oneself?  Endorsed, "she has always been quiet",  but asks questions more often, especially with appts.Today she told him"I thought we were going to [facility]" as she forgot  that she was coming to this appointment. Disoriented when walking into a room?  At night she thinks is daytime. Denies except occasionally not remembering what patient came to the room for    Leaving objects in unusual places? May misplace some things. She collects napkins.    Wandering behavior? denies   Any personality changes ? Denies. Obsessed with wetness. " there is something wet in the ceiling", does not like water Any history of depression?: denies   Hallucinations or paranoia? Over the last couple of weeks she has been seeing deceased relatives. Husband says that she has done so for a long time. Seizures? denies    Any sleep changes?  Does not sleep well, uses a sleeping pill. Denies  vivid dreams, REM behavior or sleepwalking   Sleep apnea?  Has not been using the CPAP Any hygiene concerns?  Uses the walker for the shower Independent of bathing and dressing? She starts getting dress and she does not remember how to complete the tasks, he has to help her otherwise she may stay with the pants half down. She chooses colors well, however.  Does the patient need help with medications? Husband  is in charge   Who is in charge of the finances? Husband  is in charge     Any changes in appetite?  Lost weight and "now they are working on my appetite. She has a GERD procedure ( ?dilatation) in August.  Patient have trouble swallowing she has esophageal narrowing which may cause some trouble with digestion. Does the patient cook?  No  Any headaches?  denies   Chronic back pain? Endorsed  Ambulates with difficulty? Needs a walker to ambulate, due to deconditioning. Doing PT. She has chronic cramps in the legs  Recent falls or head injuries? "After a fall she hit the hip without fracture but has pain in the area". She needs a walker to  ambulate Vision changes? R eye floaters. Sees an eye doctor  Stroke like symptoms?  denies   Any tremors?  denies   Any anosmia?  denies   Any incontinence of urine? Endorsed, wears pads . She is afraid of getting wet , especially since the surgery.  Any bowel dysfunction? She has IBS.    Patient lives  with her husband  History of heavy alcohol intake? denies   History of heavy tobacco use? denies   Family history of dementia?  Sister has dementia, mother, unknown type.      Does patient drive? No longer drives   Neuropsychological evaluation 06/17/2020. Briefly, results suggested primary deficits surrounding both attention/concentration and verbal fluency. Performance variability was further noted across processing speed and executive functions. Relative to her previous evaluation, good stability was exhibited across a majority of assessments. Mild declines were exhibited across attention/concentration, executive functioning, and phonemic fluency. Depressive symptoms across mood-based questionnaires were also noted to have worsened. An improvement was seen across her copy of a complex figure, likely due to improving visual acuity post cataract surgery. Across mood-related questionnaires, she reported acute levels of severe anxiety and severe depression. She also reported mild sleep dysfunction, likely related to her history of sleep apnea and difficulty finding a good-fitting CPAP mask. Overall, the most likely etiology of reported dysfunction continues to be a combination of significant psychiatric distress (recently worsened due to complications surrounding IBS), diffuse chronic pain related to her history of fibromyalgia, and chronic sleep dysfunction.    Caitlin [name] was accompanied by her husband during the current telephone call. They were within their residence while I was within my office. I discussed the limitations of evaluation and management by telemedicine and the availability of in person  appointments. Caitlin [name] expressed her understanding and agreed to proceed. Content of the current session focused on the results of her neuropsychological evaluation. Caitlin [name] and her husband were given the opportunity to ask questions and their questions were answered. They were encouraged to reach out should additional questions arise. A copy of her report was mailed at the conclusion of the visit.    History on Initial Assessment 09/01/2014: This is a 77 yo RH woman with a history of hypertension, diet-controlled hyperlipidemia, IBS, GERD, chronic neck and back pain, depression and anxiety, who presented for worsening memory. She reports that memory changes started several years ago, she would be unable to think of a name or word she wanted to say. She has had to write everything down otherwise she would forget appointments. She recalls the first time this happened was 12 years ago, she got a reminder call about a dentist appointment 2 days prior but still missed it. She is has been stressed and worried because 2 of her husband's family members have recently passed away with dementia. She continues to drive without getting lost. She denies missing her regular medications but occasionally forgets her vitamins. She has to stay by the stove when cooking, one time she walked away from soup she was  making and forgot about it. Her husband is in charge of bill payments. She has always had problems multi-tasking, but has noticed this has been worse the past couple of years, she easily loses her train of thought when distracted. Her maternal grandmother and 2 brothers were diagnosed with dementia. Her mother and sister had memory problems before they passed away from other medical conditions. She denies any significant head injuries, no alcohol use.    Diagnostic Data: MRI brain in 2012 was personally reviewed today, unremarkable with mild diffuse atrophy. Similar to MRI brain done 08/2014 as above.   PREVIOUS  MEDICATIONS:   CURRENT MEDICATIONS:  Outpatient Encounter Medications as of 10/17/2022  Medication Sig   acetaminophen (TYLENOL) 325 MG tablet Take 2 tablets (650 mg total) by mouth every 6 (six) hours as needed for mild pain (or Fever >/= 101).   buPROPion (WELLBUTRIN XL) 150 MG 24 hr tablet Take 1 tablet by mouth once daily   Cholecalciferol (VITAMIN D3) 1000 units CAPS Take 1,000 Units by mouth daily.   Cyanocobalamin (VITAMIN B-12) 1000 MCG SUBL Place 2,000 mcg under the tongue. Every other day   Docusate Calcium (STOOL SOFTENER PO) Take by mouth. Every other day   escitalopram (LEXAPRO) 10 MG tablet TAKE 1 & 1/2 (ONE & ONE-HALF) TABLETS BY MOUTH ONCE DAILY   famotidine (PEPCID) 40 MG tablet Take 1 tablet by mouth once daily   FIBER PO Take by mouth daily. Benefiber powder   memantine (NAMENDA) 10 MG tablet Take 1 tablet (10 mg total) by mouth 2 (two) times daily.   Multiple Vitamins-Minerals (PRESERVISION AREDS 2) CAPS Take 1 capsule by mouth 2 (two) times a week.   pantoprazole (PROTONIX) 40 MG tablet Take 1 tablet (40 mg total) by mouth daily.   potassium chloride (KLOR-CON) 10 MEQ tablet TAKE 1 TABLET BY MOUTH EVERY OTHER DAY WITH  FLUID  TABLET   SIMPLY SALINE 0.9 % AERS Place 1 spray into both nostrils in the morning and at bedtime.   [DISCONTINUED] memantine (NAMENDA) 5 MG tablet Take 1 tablet at night   No facility-administered encounter medications on file as of 10/17/2022.       07/28/2022    3:00 PM 04/04/2020    6:00 PM 03/12/2018    3:00 PM  MMSE - Mini Mental State Exam  Orientation to time 1 5 5   Orientation to Place 5 5 5   Registration 3 3 3   Attention/ Calculation 0 3 3  Recall 0 3 3  Language- name 2 objects 1 2 2   Language- repeat 1 1 1   Language- follow 3 step command 3 3 3   Language- read & follow direction 1 1 1   Write a sentence 0 1 1  Copy design 0 1 1  Total score 15 28 28        No data to display          Objective:     PHYSICAL  EXAMINATION:    VITALS:   Vitals:   10/17/22 1241  BP: (!) 143/77  Pulse: 77  Resp: 20  SpO2: 99%  Weight: 95 lb (43.1 kg)  Height: 4\' 10"  (1.473 m)      Thank you for allowing [place] the opportunity to participate in the care of this nice patient. Please do not hesitate to contact us for any questions or concerns.   Total time spent on today's visit was 38 minutes dedicated to this patient today, preparing to see patient, examining the patient, ordering  tests and/or medications and counseling the patient, documenting clinical information in the EHR or other health record, independently interpreting results and communicating results to the patient/family, discussing treatment and goals, answering patient's questions and coordinating care.  Cc:  Panosh, Neta Mends, MD  Marlowe Kays 10/17/2022 4:57 PM

## 2022-10-17 NOTE — Patient Instructions (Signed)
It was a pleasure to see you today at our office.   Recommendations:   Neuropsychological evaluation for clarity of diagnosis  Increase Memantine10 mg tablets at night Follow up in  6 months   For psychiatric meds, mood meds: Please have your primary care physician manage these medications.  If you have any severe symptoms of a stroke, or other severe issues such as confusion,severe chills or fever, etc call 911 or go to the ER as you may need to be evaluated further    For assessment of decision of mental capacity and competency:  Call Dr. Erick Blinks, geriatric psychiatrist at [phone]  Counseling regarding caregiver distress, including caregiver depression, anxiety and issues regarding community resources, adult day care programs, adult living facilities, or memory care questions:  please contact your  Primary Doctor's Social Worker   Whom to call: Memory  decline, memory medications: Call our office [phone]    [email or web address]   RECOMMENDATIONS FOR ALL PATIENTS WITH MEMORY PROBLEMS: 1. Continue to exercise (Recommend 30 minutes of walking everyday, or 3 hours every week) 2. Increase social interactions - continue going to [employer] and enjoy social gatherings with friends and family 3. Eat healthy, avoid fried foods and eat more fruits and vegetables 4. Maintain adequate blood pressure, blood sugar, and blood cholesterol level. Reducing the risk of stroke and cardiovascular disease also helps promoting better memory. 5. Avoid stressful situations. Live a simple life and avoid aggravations. Organize your time and prepare for the next day in anticipation. 6. Sleep well, avoid any interruptions of sleep and avoid any distractions in the bedroom that may interfere with adequate sleep quality 7. Avoid sugar, avoid sweets as there is a strong link between excessive sugar intake, diabetes, and cognitive impairment We  discussed the Mediterranean diet, which has been shown to help patients reduce the risk of progressive memory disorders and reduces cardiovascular risk. This includes eating fish, eat fruits and green leafy vegetables, nuts like almonds and hazelnuts, walnuts, and also use olive oil. Avoid fast foods and fried foods as much as possible. Avoid sweets and sugar as sugar use has been linked to worsening of memory function.  There is always a concern of gradual progression of memory problems. If this is the case, then we may need to adjust level of care according to patient needs. Support, both to the patient and caregiver, should then be put into place.      You have been referred for a neuropsychological evaluation (i.e., evaluation of memory and thinking abilities). Please bring someone with you to this appointment if possible, as it is helpful for the doctor to hear from both you and another adult who knows you well. Please bring eyeglasses and hearing aids if you wear them.    The evaluation will take approximately 3 hours and has two parts:   The first part is a clinical interview with the neuropsychologist (Dr. Milbert Coulter or Dr. Roseanne Reno). During the interview, the neuropsychologist will speak with you and the individual you brought to the appointment.    The second part of the evaluation is testing with the doctor's technician Annabelle Harman or Selena Batten). During the testing, the technician will ask you to remember different types of material, solve problems, and answer some questionnaires. Your family member will not be present for this portion of the evaluation.   Please note: We must reserve several hours of the neuropsychologist's time and the psychometrician's time for your evaluation appointment. As such, there is a No-Show fee  of $100. If you are unable to attend any of your appointments, please contact our office as soon as possible to reschedule.      DRIVING: Regarding driving, in patients with  progressive memory problems, driving will be impaired. We advise to have someone else do the driving if trouble finding directions or if minor accidents are reported. Independent driving assessment is available to determine safety of driving.   If you are interested in the driving assessment, you can contact the following:  [facility] [employer] [facility] in [place] [phone]  [facility] [phone]  [facility] [phone]  [facility] [phone] or [phone]   FALL PRECAUTIONS: Be cautious when walking. Scan the area for obstacles that may increase the risk of trips and falls. When getting up in the mornings, sit up at the edge of the bed for a few minutes before getting out of bed. Consider elevating the bed at the head end to avoid drop of blood pressure when getting up. Walk always in a well-lit room (use night lights in the walls). Avoid area rugs or power cords from appliances in the middle of the walkways. Use a walker or a cane if necessary and consider physical therapy for balance exercise. Get your eyesight checked regularly.  FINANCIAL OVERSIGHT: Supervision, especially oversight when making financial decisions or transactions is also recommended.  HOME SAFETY: Consider the safety of the kitchen when operating appliances like stoves, microwave oven, and blender. Consider having supervision and share cooking responsibilities until no longer able to participate in those. Accidents with firearms and other hazards in the house should be identified and addressed as well.   ABILITY TO BE LEFT ALONE: If patient is unable to contact 911 operator, consider using LifeLine, or when the need is there, arrange for someone to stay with patients. Smoking is a fire hazard, consider supervision or cessation. Risk of wandering should be assessed by caregiver and if detected at any point, supervision and safe proof recommendations should be  instituted.  MEDICATION SUPERVISION: Inability to self-administer medication needs to be constantly addressed. Implement a mechanism to ensure safe administration of the medications.      Mediterranean Diet A Mediterranean diet refers to food and lifestyle choices that are based on the traditions of countries located on the [employer]. This way of eating has been shown to help prevent certain conditions and improve outcomes for people who have chronic diseases, like kidney disease and heart disease. What are tips for following this plan? Lifestyle  Cook and eat meals together with your family, when possible. Drink enough fluid to keep your urine clear or pale yellow. Be physically active every day. This includes: Aerobic exercise like running or swimming. Leisure activities like gardening, walking, or housework. Get 7-8 hours of sleep each night. If recommended by your health care provider, drink red wine in moderation. This means 1 glass a day for nonpregnant women and 2 glasses a day for men. A glass of wine equals 5 oz (150 mL). Reading food labels  Check the serving size of packaged foods. For foods such as rice and pasta, the serving size refers to the amount of cooked product, not dry. Check the total fat in packaged foods. Avoid foods that have saturated fat or trans fats. Check the ingredients list for added sugars, such as corn syrup. Shopping  At the grocery store, buy most of your food from the areas near the walls of the store. This includes: Fresh fruits and vegetables (produce).  Grains, beans, nuts, and seeds. Some of these may be available in unpackaged forms or large amounts (in bulk). Fresh seafood. Poultry and eggs. Low-fat dairy products. Buy whole ingredients instead of prepackaged foods. Buy fresh fruits and vegetables in-season from local farmers markets. Buy frozen fruits and vegetables in resealable bags. If you do not have access to quality fresh  seafood, buy precooked frozen shrimp or canned fish, such as tuna, salmon, or sardines. Buy small amounts of raw or cooked vegetables, salads, or olives from the deli or salad bar at your store. Stock your pantry so you always have certain foods on hand, such as olive oil, canned tuna, canned tomatoes, rice, pasta, and beans. Cooking  Cook foods with extra-virgin olive oil instead of using butter or other vegetable oils. Have meat as a side dish, and have vegetables or grains as your main dish. This means having meat in small portions or adding small amounts of meat to foods like pasta or stew. Use beans or vegetables instead of meat in common dishes like chili or lasagna. Experiment with different cooking methods. Try roasting or broiling vegetables instead of steaming or sauteing them. Add frozen vegetables to soups, stews, pasta, or rice. Add nuts or seeds for added healthy fat at each meal. You can add these to yogurt, salads, or vegetable dishes. Marinate fish or vegetables using olive oil, lemon juice, garlic, and fresh herbs. Meal planning  Plan to eat 1 vegetarian meal one day each week. Try to work up to 2 vegetarian meals, if possible. Eat seafood 2 or more times a week. Have healthy snacks readily available, such as: Vegetable sticks with hummus. Greek yogurt. Fruit and nut trail mix. Eat balanced meals throughout the week. This includes: Fruit: 2-3 servings a day Vegetables: 4-5 servings a day Low-fat dairy: 2 servings a day Fish, poultry, or lean meat: 1 serving a day Beans and legumes: 2 or more servings a week Nuts and seeds: 1-2 servings a day Whole grains: 6-8 servings a day Extra-virgin olive oil: 3-4 servings a day Limit red meat and sweets to only a few servings a month What are my food choices? Mediterranean diet Recommended Grains: Whole-grain pasta. Brown rice. Bulgar wheat. Polenta. Couscous. Whole-wheat bread. Orpah Cobb. Vegetables: Artichokes. Beets.  Broccoli. Cabbage. Carrots. Eggplant. Green beans. Chard. Kale. Spinach. Onions. Leeks. Peas. Squash. Tomatoes. Peppers. Radishes. Fruits: Apples. Apricots. Avocado. Berries. Bananas. Cherries. Dates. Figs. Grapes. Lemons. Melon. Oranges. Peaches. Plums. Pomegranate. Meats and other protein foods: Beans. Almonds. Sunflower seeds. Pine nuts. Peanuts. Cod. Salmon. Scallops. Shrimp. Tuna. Tilapia. Clams. Oysters. Eggs. Dairy: Low-fat milk. Cheese. Greek yogurt. Beverages: Water. Red wine. Herbal tea. Fats and oils: Extra virgin olive oil. Avocado oil. Grape seed oil. Sweets and desserts: [place] yogurt with honey. Baked apples. Poached pears. Trail mix. Seasoning and other foods: Basil. Cilantro. Coriander. Cumin. Mint. Parsley. Sage. Rosemary. Tarragon. Garlic. Oregano. Thyme. Pepper. Balsalmic vinegar. Tahini. Hummus. Tomato sauce. Olives. Mushrooms. Limit these Grains: Prepackaged pasta or rice dishes. Prepackaged cereal with added sugar. Vegetables: Deep fried potatoes (french fries). Fruits: Fruit canned in syrup. Meats and other protein foods: Beef. Pork. Lamb. Poultry with skin. Hot dogs. Tomasa Blase. Dairy: Ice cream. Sour cream. Whole milk. Beverages: Juice. Sugar-sweetened soft drinks. Beer. Liquor and spirits. Fats and oils: Butter. Canola oil. Vegetable oil. Beef fat (tallow). Lard. Sweets and desserts: Cookies. Cakes. Pies. Candy. Seasoning and other foods: Mayonnaise. Premade sauces and marinades. The items listed may not be a complete list. Talk with your dietitian  about what dietary choices are right for you. Summary The Mediterranean diet includes both food and lifestyle choices. Eat a variety of fresh fruits and vegetables, beans, nuts, seeds, and whole grains. Limit the amount of red meat and sweets that you eat. Talk with your health care provider about whether it is safe for you to drink red wine in moderation. This means 1 glass a day for nonpregnant women and 2 glasses a day for  men. A glass of wine equals 5 oz (150 mL). This information is not intended to replace advice given to you by your health care provider. Make sure you discuss any questions you have with your health care provider. Document Released: 08/26/2015 Document Revised: 09/28/2015 Document Reviewed: 08/26/2015 Elsevier Interactive Patient Education  2017 [employer].

## 2022-10-18 NOTE — Telephone Encounter (Signed)
Sounds like a different tact  Maybe get a consult  from ent  who deals with sleep apnea and see if   intervnetion would be helpful .  Please refer to Dr Sampson Si  ent  [facility] ent  I think she evaluates sleep apnea

## 2022-10-22 ENCOUNTER — Other Ambulatory Visit: Payer: Self-pay | Admitting: Internal Medicine

## 2022-10-24 ENCOUNTER — Other Ambulatory Visit: Payer: Self-pay | Admitting: Internal Medicine

## 2022-10-24 DIAGNOSIS — Z0000 Encounter for general adult medical examination without abnormal findings: Secondary | ICD-10-CM

## 2022-11-08 ENCOUNTER — Other Ambulatory Visit: Payer: Self-pay | Admitting: Internal Medicine

## 2022-11-14 ENCOUNTER — Other Ambulatory Visit: Payer: Self-pay | Admitting: Gastroenterology

## 2022-11-21 ENCOUNTER — Ambulatory Visit
Admission: RE | Admit: 2022-11-21 | Discharge: 2022-11-21 | Disposition: A | Discharge status: Home / Self Care | Payer: Medicare Other | Source: Ambulatory Visit | Attending: Internal Medicine | Admitting: Internal Medicine

## 2022-11-21 DIAGNOSIS — Z0000 Encounter for general adult medical examination without abnormal findings: Secondary | ICD-10-CM

## 2022-12-08 ENCOUNTER — Encounter (INDEPENDENT_AMBULATORY_CARE_PROVIDER_SITE_OTHER): Payer: Self-pay | Admitting: Otolaryngology

## 2022-12-08 ENCOUNTER — Other Ambulatory Visit: Payer: Self-pay

## 2022-12-08 ENCOUNTER — Ambulatory Visit (INDEPENDENT_AMBULATORY_CARE_PROVIDER_SITE_OTHER): Payer: Medicare Other | Admitting: Otolaryngology

## 2022-12-08 VITALS — BP 148/88 | HR 76 | Ht <= 58 in | Wt 93.0 lb

## 2022-12-08 DIAGNOSIS — R1310 Dysphagia, unspecified: Secondary | ICD-10-CM

## 2022-12-08 DIAGNOSIS — G4733 Obstructive sleep apnea (adult) (pediatric): Secondary | ICD-10-CM

## 2022-12-08 DIAGNOSIS — Z789 Other specified health status: Secondary | ICD-10-CM

## 2022-12-08 DIAGNOSIS — J343 Hypertrophy of nasal turbinates: Secondary | ICD-10-CM | POA: Diagnosis not present

## 2022-12-08 DIAGNOSIS — R0981 Nasal congestion: Secondary | ICD-10-CM | POA: Diagnosis not present

## 2022-12-08 DIAGNOSIS — K219 Gastro-esophageal reflux disease without esophagitis: Secondary | ICD-10-CM

## 2022-12-08 DIAGNOSIS — J342 Deviated nasal septum: Secondary | ICD-10-CM | POA: Diagnosis not present

## 2022-12-08 DIAGNOSIS — J3089 Other allergic rhinitis: Secondary | ICD-10-CM

## 2022-12-08 NOTE — Progress Notes (Signed)
ENT CONSULT:  Reason for Consult: dysphagia and OSA/CPAP intolerance   HPI: Discussed the use of AI scribe software for clinical note transcription with the patient, who gave verbal consent to proceed.  History of Present Illness   The patient is a 77 yoF with hx of dementia, most hx is provided by caregiver, with a history of sleep apnea, presents for consultation regarding potential Inspire implant. The patient has been struggling with mask intolerance, despite multiple attempts with different masks. The patient was diagnosed with sleep apnea in 2019 following a surgery for a broken arm, when a significant drop in oxygen levels was noted while she was waking up from surgery. A subsequent sleep study confirmed the diagnosis, but the patient has been unable to tolerate CPAP therapy due to issues with mask fit.  In addition to sleep apnea, the patient has been experiencing swallowing difficulties, particularly with pills, she has been crushing pills. The patient has undergone esophageal dilation with GI, which improved swallowing to some extent, allowing the patient to consume most foods, albeit with some modifications such as cutting up chicken into small pieces.  The patient's overall health has been declining, with significant weight loss, balance issues, and worsening dementia. These health issues have precluded the patient from undergoing certain surgeries.    Records Reviewed:  Office note by Dr Lawerance Bach 05/22/2022 History of obstructive sleep apnea on BiPAP therapy Recently had a mask change Has had a lot of difficulty in the last few months Multiple falls Hospitalized, was in rehab Has not been able to tolerate a BiPAP Had a fall in December and also in January Swallowing difficulty Has ended up losing about 30 pounds She wakes up in the middle of the night almost every 2 hours to use the bathroom  Her balance continues to be a problem She has only tried a BiPAP for about a week  and a half in the last few months and she could not tolerate it Most recent download from the machine still showed elevated AHI With significant weight loss, may not be having as many events but likely still having some sleep disordered breathing events Does have a history of fibromyalgia Study did reveal obstructive sleep apnea titrated to BiPAP of 18/14, currently on 14/10 because of intolerance to pressures Patient with a known history of obstructive sleep apnea During recent surgery, noted to have significant desaturations, concern for sleep disordered breathing with significant Known snorer Had a sleep study done a few years back that was positive for sleep disordered breathing, no treatment was offered at the time   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 Weight loss recently with other health problems   Has not been able to tolerate BiPAP Only 12% compliance 14/10 Residual AHI 34.5  Will schedule a sleep study when patient more stable overall   Encouraged to stay active   Call us with significant concerns   Follow-up in 3 months   Goal of treatment is still to get on a BiPAP pressure/therapy that does not disrupt her sleep or place her at risk of falls at night    Past Medical History:  Diagnosis Date   Allergic rhinitis 07/11/2006   Back pain 10/09/2007   Blood transfusion without reported diagnosis    Diaphragmatic hernia 07/11/2006   Diverticulosis of colon    Eczema 07/11/2006   Elevated cholesterol 04/29/2012   Enteritis    Erythema nodosum    Tends to be seasonal has had a negative chest x-ray in  the past negative PPD   Essential hypertension 07/11/2006   Fatigue 03/12/2007   Fibromyalgia    Gastroparesis 12/23/2008   Generalized anxiety disorder 09/01/2014   GERD (gastroesophageal reflux disease)    History of stricture and dilatation and Nissen surgery   Hearing loss, left ear 11/27/2008   History of cataract    bilateral; s/p surgery   Hyperglycemia  08/15/2006   Irritable bowel syndrome 12/23/2008   Leg cramps, nocturnal 11/11/2007   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   MCI (mild cognitive impairment) 10/22/2018   Obstructive sleep apnea 04/23/2007   uses CPAP nightly; mask has leak   Osteoarthritis 11/28/2011   Osteopenia    Prediabetes 04/19/2011   Vitamin B12 deficiency 05/13/2010    Past Surgical History:  Procedure Laterality Date   ABDOMINAL HYSTERECTOMY     bone spur     CATARACT EXTRACTION W/ INTRAOCULAR LENS  IMPLANT, BILATERAL     CESAREAN SECTION     x 1   CHOLECYSTECTOMY     COLONOSCOPY     DENTAL SURGERY     ESOPHAGOGASTRODUODENOSCOPY     stricture and dilatation   IR KYPHO EA ADDL LEVEL THORACIC OR LUMBAR  04/28/2022   IR KYPHO THORACIC WITH BONE BIOPSY  04/28/2022   IR RADIOLOGIST EVAL & MGMT  04/20/2022   IR RADIOLOGIST EVAL & MGMT  05/19/2022   left shoulder surgery  06/08, 07/12/17   x 2   MOUTH SURGERY Right 06/03/2019   NISSEN FUNDOPLICATION     ROTATOR CUFF REPAIR Left    rt knee surgery     scope   UPPER GASTROINTESTINAL ENDOSCOPY      Family History  Problem Relation Age of Onset   Depression Mother    Kidney cancer Mother    Dementia Mother        Unspecified, likely secondary to other medical condition (cancer)   Depression Father    Hiatal hernia Father    COPD Sister    Dementia Sister        Unspecified, likely secondary to other medical condition   Dementia Brother    Heart Problems Brother    Nephrolithiasis Other    Colon cancer Neg Hx    Esophageal cancer Neg Hx    Pancreatic cancer Neg Hx    Rectal cancer Neg Hx    Stomach cancer Neg Hx     Social History:  reports that she has never smoked. She has never used smokeless tobacco. She reports that she does not drink alcohol and does not use drugs.  Allergies:  Allergies  Allergen Reactions   Codeine Phosphate Other (See Comments)    Hallucinations   Mobic [Meloxicam] Diarrhea   Protonix [Pantoprazole Sodium] Diarrhea  and Other (See Comments)    "Leg nodules," also   Ibuprofen Diarrhea and Other (See Comments)    Tears up the stomach   Peppermint Oil Other (See Comments)    Severe heartburn   Aleve [Naproxen] Nausea Only    Upset stomach if takes more than once a day   Colestipol Hcl     Patient reports reaction is "erythema multiforme"   Omeprazole-Sodium Bicarbonate Diarrhea and Other (See Comments)    Severe diarrhea, stomach cramps   Sodium Other (See Comments)    No salt!   Sulfamethoxazole Diarrhea and Nausea And Vomiting    Medications: I have reviewed the patient's current medications.  The PMH, PSH, Medications, Allergies, and SH were reviewed and updated.  ROS: Constitutional: Negative  for fever, weight loss and weight gain. Cardiovascular: Negative for chest pain and dyspnea on exertion. Respiratory: Is not experiencing shortness of breath at rest. Gastrointestinal: Negative for nausea and vomiting. Neurological: Negative for headaches. Psychiatric: The patient is not nervous/anxious  Blood pressure (!) 148/88, pulse 76, height 4\' 8"  (1.422 m), weight 93 lb (42.2 kg), SpO2 98%.  PHYSICAL EXAM:  Exam: General: Well-developed, well-nourished Respiratory Respiratory effort: Equal inspiration and expiration without stridor Cardiovascular Peripheral Vascular: Warm extremities with equal color/perfusion Eyes: No nystagmus with equal extraocular motion bilaterally Neuro/Psych/Balance: Patient oriented to person, place, and time; Appropriate mood and affect; Gait is intact with no imbalance; Cranial nerves I-XII are intact Head and Face Inspection: Normocephalic and atraumatic without mass or lesion Palpation: Facial skeleton intact without bony stepoffs Salivary Glands: No mass or tenderness Facial Strength: Facial motility symmetric and full bilaterally ENT Pinna: External ear intact and fully developed External canal: Canal is patent with intact skin Tympanic Membrane: Clear  and mobile External Nose: No scar or anatomic deformity Internal Nose: Septum is deviated to the left. No polyp, or purulence. Mucosal edema and erythema present.  Bilateral inferior turbinate hypertrophy.  Lips, Teeth, and gums: Mucosa and teeth intact and viable TMJ: No pain to palpation with full mobility Oral cavity/oropharynx: No erythema or exudate, no lesions present Nasopharynx: No mass or lesion with intact mucosa Hypopharynx: Intact mucosa without pooling of secretions Larynx Glottic: Full true vocal cord mobility without lesion or mass, VF appear atrophic Supraglottic: Normal appearing epiglottis and AE folds Interarytenoid Space: Moderate pachydermia edema Subglottic Space: Patent without lesion or edema Neck Neck and Trachea: Midline trachea without mass or lesion Thyroid: No mass or nodularity Lymphatics: No lymphadenopathy  Procedure:  Preoperative diagnosis: OSA CPAP intolerance  Postoperative diagnosis:   Same septal deviation, GERD LPR  Procedure: Flexible fiberoptic laryngoscopy  Surgeon: Ashok Croon, MD  Anesthesia: Topical lidocaine and Afrin Complications: None Condition is stable throughout exam  Indications and consent:  The patient presents to the clinic with Indirect laryngoscopy view was incomplete. Thus it was recommended that they undergo a flexible fiberoptic laryngoscopy. All of the risks, benefits, and potential complications were reviewed with the patient preoperatively and verbal informed consent was obtained.  Procedure: The patient was seated upright in the clinic. Topical lidocaine and Afrin were applied to the nasal cavity. After adequate anesthesia had occurred, I then proceeded to pass the flexible telescope into the nasal cavity. The nasal cavity was patent without rhinorrhea or polyp. The nasopharynx was also patent without mass or lesion. The base of tongue was visualized and was normal. There were no signs of pooling of secretions in  the piriform sinuses. The true vocal folds were mobile bilaterally. There were no signs of glottic or supraglottic mucosal lesion or mass. There was moderate interarytenoid pachydermia and post cricoid edema. The telescope was then slowly withdrawn and the patient tolerated the procedure throughout.  Studies Reviewed: Esophagram 06/14/22 IMPRESSION: 1. Moderately narrowed appearance of the distal esophagus concerning for possible stricture. Endoscopy should be considered for further evaluation. 2. Moderate esophageal dysmotility. 3. Small to moderate-sized hiatal hernia. 4. Small curvilinear focus of contrast projecting rightward (above the diaphragm) at the level of the hiatal hernia. This likely reflects contrast tracking within a gastric fold which previously contributed to the Nissen wrap. 5. The patient was unable to swallow a 13 mm barium tablet despite multiple attempts.  MBS 06/14/22 HPI: Patient is a 77 year old female referred for modified barium swallow by GI  MD.  Past medical history significant for fall in December 2023 and January 2024.  She is status post kyphoplasty for spinal injury.  Past medical history also significant for GERD, fibromyalgia, IBS, sleep apnea with prior use of CPAP, mild cognitive impairment, recurrent breathing issues.  Patient endorses dysphagia stating that she senses food or pills lodging in pharynx both pointing proximal and stool.  She admits to coughing and expectorating pills and food at times she admits to weight loss of at least 30 pounds stating prior to her fall she was 144 pounds and now is 94 pounds.  Patient denies any neuro history and she had a CT of her brain that was negative during one of her hospital stay.  She states as of late she has felt like she has had pneumonia but no compromise listed in her chart.     Clinical Impression: Clinical Impression: Patient presents with normal oropharyngeal swallow ability to her age.  Swallow function  marked by minimally prolonged oral transiting of boluses and mild retention.  Pharyngeal swallow is strong and timely. Only trace residual of liquid in pharynx.  Trace flash penetratin of thin liquids noted due to decreased sustained laryngeal closure.  Of note, pt did report sensation of retention in right side of pharynx, but pharynx waas clear at that time.  Of note, pt tends to tilt chin upward with swallowing, stating it helps to clear food/drink into esophagus.  She appears with slight anterior curvature of cervical spine that did not impair barium flow.  Tablet not administered. Thanks for this consult  EGD 09/05/22   HST done 10/30/2017  AHI of 17.8 30% of central/mixed apneas noted  Assessment/Plan: Encounter Diagnoses  Name Primary?   Dysphagia, unspecified type Yes   OSA (obstructive sleep apnea)    Intolerance of continuous positive airway pressure (CPAP) ventilation    Nasal septal deviation    Hypertrophy of both inferior nasal turbinates    Nasal congestion    Environmental and seasonal allergies [J30.89]     Assessment and Plan    Obstructive Sleep Apnea Diagnosed in 2019 with multiple failed CPAP attempts due to poor fit of the mask over her face/nose. Not a candidate for Inspire implant based on 2019 sleep study showing 30% central or mixed apneas. Discussed repeat sleep study to reassess eligibility for Inspire implant, including procedure details, risks, and alternative treatments such as oral appliance. - repeat in-lab sleep study - messaged Pattricia Boss to assist in scheduling  Dysphagia limited to pills at this time, sx improved following EGD and lower esophageal dilation by GI 08/2022, currently tolerating regular diet. I reviewed MBS results and she had evidence of intact oropharyngeal swallowing.   Currently able to eat solid foods with modifications. No further surgical options due to overall health status. - Continue current dietary modifications - Consider  further evaluation by GI if symptoms worsen  Nasal congestion Septal Deviation, inferior turbinate hypertrophy and nasal congestion  - could contribute to CPAP mask intolerance - medical management of nasal congestion with nasal sprays (Rx sent)  General Health Maintenance Dry mouth and nasal passages likely due to mouth breathing and medication side effects. No signs of sinus infection on scope exam today. Advised on hydration and potential use of mouth tape or chin strap to reduce mouth breathing. - Encourage frequent small sips of water - Consider using mouth tape or chin strap to reduce mouth breathing - Continue using saline nasal spray as needed  Follow-up -  Schedule follow-up appointment in a few months to review sleep study results and further management.     Thank you for allowing me to participate in the care of this patient. Please do not hesitate to contact me with any questions or concerns.   Ashok Croon, MD Otolaryngology [facility] Phone: [phone] Fax: [phone]    12/08/2022, 4:36 PM

## 2022-12-08 NOTE — Progress Notes (Signed)
Received epic secure chat: Dr. Irene Pap (ENT) is requesting an in-lab sleep study be ordered for this patient for Inspire. Will you please order? Thanks!   Sleep study ordered.

## 2022-12-11 ENCOUNTER — Telehealth: Payer: Self-pay | Admitting: Internal Medicine

## 2022-12-11 NOTE — Telephone Encounter (Signed)
Housecalls [phone]  Pt has mild circulation in both legs.  Sending report.  2 wk turn-around.

## 2022-12-19 ENCOUNTER — Other Ambulatory Visit: Payer: Self-pay | Admitting: Family

## 2023-01-18 ENCOUNTER — Other Ambulatory Visit: Payer: Self-pay | Admitting: Family

## 2023-01-22 ENCOUNTER — Telehealth: Payer: Self-pay

## 2023-01-22 ENCOUNTER — Other Ambulatory Visit: Payer: Self-pay | Admitting: Family

## 2023-01-22 MED ORDER — BUPROPION HCL ER (XL) 150 MG PO TB24: 150.0000 mg | ORAL_TABLET | Freq: Every day | ORAL | 0 refills | Status: DC

## 2023-01-22 NOTE — Telephone Encounter (Signed)
 Copied from CRM [id number]. Topic: Clinical - Medication Refill >> Jan 22, 2023 11:29 AM Franky GRADE wrote: Most Recent Primary Care Visit:  Provider: CHARLETT HOWARD K  Department: [facility]  Visit Type: MEDICARE AWV, SEQUENTIAL  Date: 09/28/2022  Medication: buPROPion  (WELLBUTRIN  XL) 150 MG 24 hr tablet  Has the patient contacted their pharmacy? Yes, pharmacy sent a refill request (Agent: If no, request that the patient contact the pharmacy for the refill. If patient does not wish to contact the pharmacy document the reason why and proceed with request.) (Agent: If yes, when and what did the pharmacy advise?)  Is this the correct pharmacy for this prescription? Yes If no, delete pharmacy and type the correct one.  This is the patient's preferred pharmacy:  [facility] [address], [place] - [address] [place] [address] Phone: [phone] Fax: [phone]   Has the prescription been filled recently? No  Is the patient out of the medication? No, she has enough for 3 days.   Has the patient been seen for an appointment in the last year OR does the patient have an upcoming appointment? Yes  Can we respond through MyChart? Yes  Agent: Please be advised that Rx refills may take up to 3 business days. We ask that you follow-up with your pharmacy.

## 2023-02-01 LAB — HM DIABETES EYE EXAM

## 2023-02-05 ENCOUNTER — Other Ambulatory Visit: Payer: Self-pay | Admitting: Family

## 2023-02-06 ENCOUNTER — Ambulatory Visit (HOSPITAL_BASED_OUTPATIENT_CLINIC_OR_DEPARTMENT_OTHER): Payer: Medicare Other | Admitting: Pulmonary Disease

## 2023-02-06 ENCOUNTER — Other Ambulatory Visit: Payer: Self-pay | Admitting: Family

## 2023-02-06 ENCOUNTER — Telehealth: Payer: Self-pay | Admitting: Internal Medicine

## 2023-02-06 MED ORDER — ESCITALOPRAM OXALATE 10 MG PO TABS: 15.0000 mg | ORAL_TABLET | Freq: Every day | ORAL | 0 refills | Status: DC

## 2023-02-06 NOTE — Telephone Encounter (Signed)
Copied from CRM [id number]. Topic: Clinical - Medication Refill >> Feb 06, 2023 11:23 AM Almira Coaster wrote: Most Recent Primary Care Visit:  Provider: Berniece Andreas K  Department: [facility]  Visit Type: MEDICARE AWV, SEQUENTIAL  Date: 09/28/2022  Medication: escitalopram (LEXAPRO) 10 MG tablet   Has the patient contacted their pharmacy? Yes, pharmacy has sent multiple request without a response.  (Agent: If no, request that the patient contact the pharmacy for the refill. If patient does not wish to contact the pharmacy document the reason why and proceed with request.) (Agent: If yes, when and what did the pharmacy advise?)  Is this the correct pharmacy for this prescription? Yes If no, delete pharmacy and type the correct one.  This is the patient's preferred pharmacy:  [facility] [address] [place] - [address] [place] [address] Phone: [phone] Fax: [phone]   Has the prescription been filled recently? No  Is the patient out of the medication? No, she has enough for five days   Has the patient been seen for an appointment in the last year OR does the patient have an upcoming appointment? Yes  Can we respond through MyChart? Yes  Agent: Please be advised that Rx refills may take up to 3 business days. We ask that you follow-up with your pharmacy.

## 2023-02-11 ENCOUNTER — Ambulatory Visit (HOSPITAL_BASED_OUTPATIENT_CLINIC_OR_DEPARTMENT_OTHER): Payer: Medicare Other | Attending: Pulmonary Disease | Admitting: Pulmonary Disease

## 2023-02-11 DIAGNOSIS — G4733 Obstructive sleep apnea (adult) (pediatric): Secondary | ICD-10-CM

## 2023-02-14 ENCOUNTER — Other Ambulatory Visit: Payer: Self-pay | Admitting: Pulmonary Disease

## 2023-02-14 DIAGNOSIS — G4733 Obstructive sleep apnea (adult) (pediatric): Secondary | ICD-10-CM

## 2023-02-19 ENCOUNTER — Telehealth: Payer: Self-pay | Admitting: Pulmonary Disease

## 2023-02-19 DIAGNOSIS — G4733 Obstructive sleep apnea (adult) (pediatric): Secondary | ICD-10-CM

## 2023-02-19 NOTE — Telephone Encounter (Signed)
Call patient  Sleep study result  Date of study: 02/11/2023  Impression: Severe obstructive sleep apnea with mild oxygen desaturations AHI of 44.2  Sleep maintenance insomnia  Recommendation:  Schedule for follow-up in the office for further discussions regarding options of treatment.  Inspire device may be considered as an option of treatment, insomnia and compliance needs to be further discussed prior to considering this as an option of treatment.  Encourage weight maintenance efforts  Optimize sleep hygiene

## 2023-02-19 NOTE — Procedures (Signed)
POLYSOMNOGRAPHY  Last, First: Caitlin, [name] MRN: [id number] Gender: Female Age (years): 83 Weight (lbs): 95 DOB: 1945/05/31 BMI: 19 Primary Care: Madelin Headings Epworth Score: 12 Referring: Tomma Lightning MD Technician: Elaina Pattee Interpreting: Tomma Lightning MD Study Type: NPSG Ordered Study Type: NPSG Study date: 02/11/2023 Location: [place] CLINICAL INFORMATION Caitlin [name] is a 78 year old Female and was referred to the sleep center for evaluation of N/A. Indications include Daytime Fatigue.  MEDICATIONS Patient self administered medications include: N/A. Medications administered during study include No sleep medicine administered.  SLEEP STUDY TECHNIQUE A multi-channel overnight Polysomnography study was performed. The channels recorded and monitored were central and occipital EEG, electrooculogram (EOG), submentalis EMG (chin), nasal and oral airflow, thoracic and abdominal wall motion, anterior tibialis EMG, snore microphone, electrocardiogram, and a pulse oximetry. TECHNICIAN COMMENTS Comments added by Technician: Patient was restless all through the night. Patient had difficulty initiating sleep. Comments added by Scorer: N/A SLEEP ARCHITECTURE The study was initiated at 10:55:40 PM and terminated at 5:09:23 AM. The total recorded time was 373.7 minutes. EEG confirmed total sleep time was 89.5 minutes yielding a sleep efficiency of 23.9%. Sleep onset after lights out was 7.9 minutes with a REM latency of N/A minutes. The patient spent 21.8% of the night in stage N1 sleep, 77.1% in stage N2 sleep, 1.1% in stage N3 and 0% in REM. Wake after sleep onset (WASO) was 276.3 minutes. The Arousal Index was 55.6/hour. RESPIRATORY PARAMETERS There were a total of 66 respiratory disturbances out of which 39 were apneas ( 25 obstructive, 0 mixed, 14 central) and 27 hypopneas. The apnea/hypopnea index (AHI) was 44.2 events/hour. The central sleep apnea index was 9.4  events/hour. The REM AHI was N/A events/hour and NREM AHI was 44.2 events/hour. The supine AHI was 44.2 events/hour and the non supine AHI was 0 supine during 100.00% of sleep. Respiratory disturbances were associated with oxygen desaturation down to a nadir of 87.0% during sleep. The mean oxygen saturation during the study was 94.6%. The cumulative time under 88% oxygen saturation was 5.5 minutes.  LEG MOVEMENT DATA The total leg movements were 0 with a resulting leg movement index of 0.0/hr .Associated arousal with leg movement index was 0.0/hr.  CARDIAC DATA The underlying cardiac rhythm was most consistent with sinus rhythm. Mean heart rate during sleep was 76.2 bpm. Additional rhythm abnormalities include None.  IMPRESSIONS - Severe Obstructive Sleep apnea(OSA) - Very poor sleep efficiency - EKG showed no cardiac abnormalities. - Mild Oxygen Desaturation - No snoring was audible during this study. - No significant periodic leg movements(PLMs) during sleep. However, no significant associated arousals.  DIAGNOSIS - Severe Obstructive Sleep Apnea (G47.33) - Central Sleep Apnea (G47.37) - Nocturnal Hypoxemia (G47.36) - Sleep maintenance insomnia  RECOMMENDATIONS - CPAP titration to determine optimal pressure required to alleviate sleep disordered breathing. BiPAP or ASV titration may be required to eliminate central sleep apnea. - With history of CPAP intolerance, may consider other options of treatment - Positional therapy avoiding supine position during sleep. - Avoid alcohol, sedatives and other CNS depressants that may worsen sleep apnea and disrupt normal sleep architecture. - Sleep hygiene should be reviewed to assess factors that may improve sleep quality. - Weight management and regular exercise should be initiated or continued.  [Electronically signed] 02/19/2023 06:37 PM  Virl Diamond MD NPI: [id number]

## 2023-02-20 NOTE — Telephone Encounter (Signed)
 I called and spoke with pt's spouse ok per DPR  I advised him of results He verbalized understanding  He says pt has an appt with ENT scheduled for 03/05/23 for inspire discussion  She wants to start here and see us  again if needed I faxed them copy of her PSG  Routing to Dr Neda as FYI

## 2023-02-21 ENCOUNTER — Other Ambulatory Visit: Payer: Self-pay | Admitting: Internal Medicine

## 2023-03-05 ENCOUNTER — Encounter (INDEPENDENT_AMBULATORY_CARE_PROVIDER_SITE_OTHER): Payer: Self-pay | Admitting: Otolaryngology

## 2023-03-05 ENCOUNTER — Ambulatory Visit (INDEPENDENT_AMBULATORY_CARE_PROVIDER_SITE_OTHER): Payer: Medicare Other | Admitting: Otolaryngology

## 2023-03-05 ENCOUNTER — Telehealth (INDEPENDENT_AMBULATORY_CARE_PROVIDER_SITE_OTHER): Payer: Self-pay

## 2023-03-05 VITALS — BP 154/83 | HR 74

## 2023-03-05 DIAGNOSIS — G4710 Hypersomnia, unspecified: Secondary | ICD-10-CM | POA: Diagnosis not present

## 2023-03-05 DIAGNOSIS — J343 Hypertrophy of nasal turbinates: Secondary | ICD-10-CM

## 2023-03-05 DIAGNOSIS — G4733 Obstructive sleep apnea (adult) (pediatric): Secondary | ICD-10-CM

## 2023-03-05 DIAGNOSIS — J3089 Other allergic rhinitis: Secondary | ICD-10-CM

## 2023-03-05 DIAGNOSIS — R1310 Dysphagia, unspecified: Secondary | ICD-10-CM | POA: Diagnosis not present

## 2023-03-05 DIAGNOSIS — Z789 Other specified health status: Secondary | ICD-10-CM

## 2023-03-05 DIAGNOSIS — R0981 Nasal congestion: Secondary | ICD-10-CM

## 2023-03-05 DIAGNOSIS — J342 Deviated nasal septum: Secondary | ICD-10-CM

## 2023-03-05 NOTE — Progress Notes (Unsigned)
ENT Progress Note:   Update 03/06/2023  Discussed the use of AI scribe software for clinical note transcription with the patient, who gave verbal consent to proceed.  History of Present Illness   Caitlin [name] is a 78 year old female with obstructive sleep apnea who presents with intolerance to CPAP therapy.  Diagnosed with obstructive sleep apnea in 2019, she initially used CPAP therapy but has been unable to tolerate it for the past year due to poor mask fit. She has tried multiple masks, including a child's mask, which was too small, and an adult mask, which was discontinued. Despite these attempts, none have provided the necessary benefit, leading to non-compliance with CPAP therapy for the past year.  In January 2024, she was hospitalized for a week, during which attempts to fit a CPAP mask were unsuccessful. Her sleep is disturbed, with her mouth becoming very dry, and she uses a bottle of saline every four weeks to alleviate dryness.   She experiences significant fatigue, sleeping up to 14 hours a day without feeling rested. This fatigue impacts her daily life significantly, making it difficult for her to leave the house or engage in activities such as attending church. She has experienced this level of fatigue since approximately 2017, which may coincide with the onset of her sleep apnea.  She has a history of insomnia and takes melatonin to aid sleep. Without melatonin, it takes her longer to fall asleep, but she eventually does. She also takes medication for anxiety, though the specific medication was not named.  No history of heart or lung disease, stroke, or heart attack. Her physical health is generally good, with no high blood pressure, cholesterol issues, or liver and kidney function problems. She has undergone various surgeries, including knee and shoulder surgeries, and has recovered well from them. On Namenda, cognitive enhancer medication. She also has Wellbutrin and Lexapro  listed on medication list.     Records Reviewed:  Initial Evaluation  Reason for Consult: dysphagia and OSA/CPAP intolerance   HPI: Discussed the use of AI scribe software for clinical note transcription with the patient, who gave verbal consent to proceed.  History of Present Illness   The patient is a 33 yoF with hx of dementia, most hx is provided by caregiver, with a history of sleep apnea, presents for consultation regarding potential Inspire implant. The patient has been struggling with mask intolerance, despite multiple attempts with different masks. The patient was diagnosed with sleep apnea in 2019 following a surgery for a broken arm, when a significant drop in oxygen levels was noted while she was waking up from surgery. A subsequent sleep study confirmed the diagnosis, but the patient has been unable to tolerate CPAP therapy due to issues with mask fit.  In addition to sleep apnea, the patient has been experiencing swallowing difficulties, particularly with pills, she has been crushing pills. The patient has undergone esophageal dilation with GI, which improved swallowing to some extent, allowing the patient to consume most foods, albeit with some modifications such as cutting up chicken into small pieces.  The patient's overall health has been declining, with significant weight loss, balance issues, and worsening dementia. These health issues have precluded the patient from undergoing certain surgeries.    Records Reviewed:  Office note by Dr Lawerance Bach 05/22/2022 History of obstructive sleep apnea on BiPAP therapy Recently had a mask change Has had a lot of difficulty in the last few months Multiple falls Hospitalized, was in rehab Has not  been able to tolerate a BiPAP Had a fall in December and also in January Swallowing difficulty Has ended up losing about 30 pounds She wakes up in the middle of the night almost every 2 hours to use the bathroom  Her balance continues to  be a problem She has only tried a BiPAP for about a week and a half in the last few months and she could not tolerate it Most recent download from the machine still showed elevated AHI With significant weight loss, may not be having as many events but likely still having some sleep disordered breathing events Does have a history of fibromyalgia Study did reveal obstructive sleep apnea titrated to BiPAP of 18/14, currently on 14/10 because of intolerance to pressures Patient with a known history of obstructive sleep apnea During recent surgery, noted to have significant desaturations, concern for sleep disordered breathing with significant Known snorer Had a sleep study done a few years back that was positive for sleep disordered breathing, no treatment was offered at the time   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 Weight loss recently with other health problems   Has not been able to tolerate BiPAP Only 12% compliance 14/10 Residual AHI 34.5  Will schedule a sleep study when patient more stable overall   Encouraged to stay active   Call us with significant concerns   Follow-up in 3 months   Goal of treatment is still to get on a BiPAP pressure/therapy that does not disrupt her sleep or place her at risk of falls at night    Past Medical History:  Diagnosis Date   Allergic rhinitis 07/11/2006   Back pain 10/09/2007   Blood transfusion without reported diagnosis    Diaphragmatic hernia 07/11/2006   Diverticulosis of colon    Eczema 07/11/2006   Elevated cholesterol 04/29/2012   Enteritis    Erythema nodosum    Tends to be seasonal has had a negative chest x-ray in the past negative PPD   Essential hypertension 07/11/2006   Fatigue 03/12/2007   Fibromyalgia    Gastroparesis 12/23/2008   Generalized anxiety disorder 09/01/2014   GERD (gastroesophageal reflux disease)    History of stricture and dilatation and Nissen surgery   Hearing loss, left ear 11/27/2008   History of  cataract    bilateral; s/p surgery   Hyperglycemia 08/15/2006   Irritable bowel syndrome 12/23/2008   Leg cramps, nocturnal 11/11/2007   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   MCI (mild cognitive impairment) 10/22/2018   Obstructive sleep apnea 04/23/2007   uses CPAP nightly; mask has leak   Osteoarthritis 11/28/2011   Osteopenia    Prediabetes 04/19/2011   Vitamin B12 deficiency 05/13/2010    Past Surgical History:  Procedure Laterality Date   ABDOMINAL HYSTERECTOMY     bone spur     CATARACT EXTRACTION W/ INTRAOCULAR LENS  IMPLANT, BILATERAL     CESAREAN SECTION     x 1   CHOLECYSTECTOMY     COLONOSCOPY     DENTAL SURGERY     ESOPHAGOGASTRODUODENOSCOPY     stricture and dilatation   IR KYPHO EA ADDL LEVEL THORACIC OR LUMBAR  04/28/2022   IR KYPHO THORACIC WITH BONE BIOPSY  04/28/2022   IR RADIOLOGIST EVAL & MGMT  04/20/2022   IR RADIOLOGIST EVAL & MGMT  05/19/2022   left shoulder surgery  06/08, 07/12/17   x 2   MOUTH SURGERY Right 06/03/2019   NISSEN FUNDOPLICATION     ROTATOR CUFF REPAIR Left  rt knee surgery     scope   UPPER GASTROINTESTINAL ENDOSCOPY      Family History  Problem Relation Age of Onset   Depression Mother    Kidney cancer Mother    Dementia Mother        Unspecified, likely secondary to other medical condition (cancer)   Depression Father    Hiatal hernia Father    COPD Sister    Dementia Sister        Unspecified, likely secondary to other medical condition   Dementia Brother    Heart Problems Brother    Nephrolithiasis Other    Colon cancer Neg Hx    Esophageal cancer Neg Hx    Pancreatic cancer Neg Hx    Rectal cancer Neg Hx    Stomach cancer Neg Hx     Social History:  reports that she has never smoked. She has never used smokeless tobacco. She reports that she does not drink alcohol and does not use drugs.  Allergies:  Allergies  Allergen Reactions   Codeine Phosphate Other (See Comments)    Hallucinations   Mobic [Meloxicam]  Diarrhea   Protonix [Pantoprazole Sodium] Diarrhea and Other (See Comments)    "Leg nodules," also   Ibuprofen Diarrhea and Other (See Comments)    Tears up the stomach   Peppermint Oil Other (See Comments)    Severe heartburn   Aleve [Naproxen] Nausea Only    Upset stomach if takes more than once a day   Colestipol Hcl     Patient reports reaction is "erythema multiforme"   Omeprazole-Sodium Bicarbonate Diarrhea and Other (See Comments)    Severe diarrhea, stomach cramps   Sodium Other (See Comments)    No salt!   Sulfamethoxazole Diarrhea and Nausea And Vomiting    Medications: I have reviewed the patient's current medications.  The PMH, PSH, Medications, Allergies, and SH were reviewed and updated.  ROS: Constitutional: Negative for fever, weight loss and weight gain. Cardiovascular: Negative for chest pain and dyspnea on exertion. Respiratory: Is not experiencing shortness of breath at rest. Gastrointestinal: Negative for nausea and vomiting. Neurological: Negative for headaches. Psychiatric: The patient is not nervous/anxious  Blood pressure (!) 154/83, pulse 74, SpO2 98%.  PHYSICAL EXAM:  Exam: General: Well-developed, well-nourished Respiratory Respiratory effort: Equal inspiration and expiration without stridor Cardiovascular Peripheral Vascular: Warm extremities with equal color/perfusion Eyes: No nystagmus with equal extraocular motion bilaterally Neuro/Psych/Balance: Patient oriented to person, place, and time; Appropriate mood and affect; Gait is intact with no imbalance; Cranial nerves I-XII are intact Head and Face Inspection: Normocephalic and atraumatic without mass or lesion Facial Strength: Facial motility symmetric and full bilaterally ENT Pinna: External ear intact and fully developed External canal: Canal is patent with intact skin Tympanic Membrane: Clear and mobile External Nose: No scar or anatomic deformity Internal Nose: Septum is deviated  to the left. No polyp, or purulence. Mucosal edema and erythema present.  Bilateral inferior turbinate hypertrophy.  Lips, Teeth, and gums: Mucosa and teeth intact and viable Oral cavity/oropharynx: No erythema or exudate, no lesions present Neck Neck and Trachea: Midline trachea without mass or lesion Thyroid: No mass or nodularity Lymphatics: No lymphadenopathy   Studies Reviewed: Esophagram 06/14/22 IMPRESSION: 1. Moderately narrowed appearance of the distal esophagus concerning for possible stricture. Endoscopy should be considered for further evaluation. 2. Moderate esophageal dysmotility. 3. Small to moderate-sized hiatal hernia. 4. Small curvilinear focus of contrast projecting rightward (above the diaphragm) at the level of the hiatal  hernia. This likely reflects contrast tracking within a gastric fold which previously contributed to the Nissen wrap. 5. The patient was unable to swallow a 13 mm barium tablet despite multiple attempts.  MBS 06/14/22 HPI: Patient is a 78 year old female referred for modified barium swallow by GI MD.  Past medical history significant for fall in December 2023 and January 2024.  She is status post kyphoplasty for spinal injury.  Past medical history also significant for GERD, fibromyalgia, IBS, sleep apnea with prior use of CPAP, mild cognitive impairment, recurrent breathing issues.  Patient endorses dysphagia stating that she senses food or pills lodging in pharynx both pointing proximal and stool.  She admits to coughing and expectorating pills and food at times she admits to weight loss of at least 30 pounds stating prior to her fall she was 144 pounds and now is 94 pounds.  Patient denies any neuro history and she had a CT of her brain that was negative during one of her hospital stay.  She states as of late she has felt like she has had pneumonia but no compromise listed in her chart.   Clinical Impression: Patient presents with normal  oropharyngeal swallow ability to her age.  Swallow function marked by minimally prolonged oral transiting of boluses and mild retention.  Pharyngeal swallow is strong and timely. Only trace residual of liquid in pharynx.  Trace flash penetratin of thin liquids noted due to decreased sustained laryngeal closure.  Of note, pt did report sensation of retention in right side of pharynx, but pharynx waas clear at that time.  Of note, pt tends to tilt chin upward with swallowing, stating it helps to clear food/drink into esophagus.  She appears with slight anterior curvature of cervical spine that did not impair barium flow.  Tablet not administered. Thanks for this consult  EGD 09/05/22   HST done 10/30/2017  AHI of 17.8 30% of central/mixed apneas noted  In-lab sleep study 02/11/23 BMI 19 AHI 36.9 34% of central apneas/hypopneas    Assessment/Plan: Encounter Diagnoses  Name Primary?   OSA (obstructive sleep apnea) Yes   Nasal septal deviation    Hypertrophy of both inferior nasal turbinates    Intolerance of continuous positive airway pressure (CPAP) ventilation    Environmental and seasonal allergies [J30.89]    Chronic nasal congestion     Assessment and Plan    Obstructive Sleep Apnea Diagnosed in 2019 with multiple failed CPAP attempts due to poor fit of the mask over her face/nose. Not a candidate for Inspire implant based on 2019 sleep study showing 30% central or mixed apneas. Discussed repeat sleep study to reassess eligibility for Inspire implant, including procedure details, risks, and alternative treatments such as oral appliance. - repeat in-lab sleep study - messaged Pattricia Boss to assist in scheduling  Dysphagia limited to pills at this time, sx improved following EGD and lower esophageal dilation by GI 08/2022, currently tolerating regular diet. I reviewed MBS results and she had evidence of intact oropharyngeal swallowing.   Currently able to eat solid foods with  modifications. No further surgical options due to overall health status. - Continue current dietary modifications - Consider further evaluation by GI if symptoms worsen  Nasal congestion Septal Deviation, inferior turbinate hypertrophy and nasal congestion  - could contribute to CPAP mask intolerance - medical management of nasal congestion with nasal sprays (Rx sent)  General Health Maintenance Dry mouth and nasal passages likely due to mouth breathing and medication side effects. No  signs of sinus infection on scope exam today. Advised on hydration and potential use of mouth tape or chin strap to reduce mouth breathing. - Encourage frequent small sips of water - Consider using mouth tape or chin strap to reduce mouth breathing - Continue using saline nasal spray as needed  Follow-up - Schedule follow-up appointment in a few months to review sleep study results and further management.     Update 03/06/2023 Assessment and Plan    Obstructive Sleep Apnea (OSA) Diagnosed with OSA in 2019. Non-compliant with CPAP therapy for the past year due to mask fit issues. Multiple masks tried without success. BMI of 19, not a candidate for Inspire therapy, based on 34% of central apneas/hypopneas on recent sleep study 02/11/23.  - Discuss criteria for Inspire Implant and the fact that cutoff for central apneas should be 25% or below - I encouraged the patient to consider oral appliance as an option  Hypersomnia - consider discussions with PCP and Sleep medicine regarding polypharmacy as a potential reason for sleeping 14 hrs per day vs other causes     Dysphagia limited to pills at this time, sx improved following EGD and lower esophageal dilation by GI 08/2022, currently tolerating regular diet. I reviewed MBS results and she had evidence of intact oropharyngeal swallowing.   Currently able to eat solid foods with modifications. No further surgical options due to overall health status. - Continue  current dietary modifications - Consider further evaluation by GI if symptoms worsen  General Health Maintenance Dry mouth and nasal passages likely due to mouth breathing and medication side effects. No signs of sinus infection on scope exam today. Advised on hydration and potential use of mouth tape or chin strap to reduce mouth breathing. - Encourage frequent small sips of water - Consider using mouth tape or chin strap to reduce mouth breathing - Continue using saline nasal spray as needed  I spent 30 minutes in total face-to-face time and in reviewing records during pre-charting, more than 50% of which was spent in counseling and coordination of care, reviewing test results, reviewing medications and treatment regimen and/or in discussing or reviewing the diagnosis, the prognosis and treatment options. Pertinent laboratory and imaging test results that were available during this visit with the patient were reviewed by me and considered in my medical decision making (see chart for details).    Ashok Croon, MD Otolaryngology [facility] Phone: [phone] Fax: [phone]  03/06/2023, 6:27 AM

## 2023-03-05 NOTE — Telephone Encounter (Signed)
Per Dr Domenic Moras, she spoke to [facility] with Earnest Bailey and the patient is not a candidate with her central apneas, I told them per Dr Irene Pap to contact the sleep doctor to discuss further options and it may be possible to repeat the study at a later date if there was possibly a medication that contributed to the central numbers

## 2023-03-07 ENCOUNTER — Telehealth: Payer: Self-pay | Admitting: Pulmonary Disease

## 2023-03-07 NOTE — Telephone Encounter (Signed)
PT has made a FU appt w/Dr. Val Eagle. Please see her chart. She has early onset dementia and I see a note from another provider that reads:   Per Dr Domenic Moras, she spoke to [place] with Earnest Bailey and the patient is not a candidate with her central apneas, I told them per Dr Irene Pap to contact the sleep doctor to discuss further options and it may be possible to repeat the study at a later date if there was possibly a medication that contributed to the central numbers     I just wanted to be sure I sched this correctly since inspire was involved. TY

## 2023-03-09 NOTE — Telephone Encounter (Signed)
 NFN

## 2023-03-22 ENCOUNTER — Ambulatory Visit: Payer: Medicare Other | Admitting: Pulmonary Disease

## 2023-03-22 ENCOUNTER — Encounter: Payer: Self-pay | Admitting: Pulmonary Disease

## 2023-03-22 VITALS — BP 140/81 | HR 71 | Ht <= 58 in | Wt 96.8 lb

## 2023-03-22 DIAGNOSIS — G4733 Obstructive sleep apnea (adult) (pediatric): Secondary | ICD-10-CM | POA: Diagnosis not present

## 2023-03-22 NOTE — Progress Notes (Signed)
 Caitlin [name]    [id number]    01-16-1946  Primary Care Physician:Panosh, Neta Mends, MD  Referring Physician: Madelin Headings, MD [address] [place],  [place] [address]  Chief complaint:  History of obstructive sleep apnea on BiPAP therapy In for follow-up  HPI:  Recent hospitalization, was recently in rehab -Could not find a mask that fits  Accompanied by spouse Has had multiple falls  Recently had a sleep study 02/11/2023  She has been relatively well Not able to tolerate BiPAP  Has lost a lot of weight recently  She wakes up in the middle of the night almost every 2 hours to use the bathroom  Her balance continues to be a problem  We do not have a recent download from the machine as it has not been used recently  Does have a history of fibromyalgia  Study did reveal obstructive sleep apnea titrated to BiPAP of 18/14, last pressure change was to 14/10 because of pressure intolerance  Patient with a known history of obstructive sleep apnea  Had a sleep study done a few years back that was positive for sleep disordered breathing, no treatment was offered at the time  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   buPROPion (WELLBUTRIN XL) 150 MG 24 hr tablet Take 1 tablet (150 mg total) by mouth daily.   Cholecalciferol (VITAMIN D3) 1000 units CAPS Take 1,000 Units by mouth daily.   Cyanocobalamin (VITAMIN B-12) 1000 MCG SUBL Place 2,000 mcg under the tongue. Every other day   Docusate Calcium (STOOL SOFTENER PO) Take by mouth. Every other day   escitalopram (LEXAPRO) 10 MG tablet Take 1.5 tablets (15 mg total) by mouth daily.   famotidine (PEPCID) 40 MG tablet Take 1 tablet by mouth once daily   FIBER  PO Take by mouth daily. Benefiber powder   memantine (NAMENDA) 10 MG tablet Take 1 tablet (10 mg total) by mouth 2 (two) times daily.   Multiple Vitamins-Minerals (PRESERVISION AREDS 2) CAPS Take 1 capsule by mouth 2 (two) times a week.   pantoprazole (PROTONIX) 40 MG tablet Take 1 tablet (40 mg total) by mouth daily.   potassium chloride (KLOR-CON) 10 MEQ tablet TAKE 1 TABLET BY MOUTH EVERY OTHER DAY WITH  FLUID  TABLET   SIMPLY SALINE 0.9 % AERS Place 1 spray into both nostrils in the morning and at bedtime.   No facility-administered encounter medications on file as of 03/22/2023.    Allergies as of 03/22/2023 - Review Complete 03/22/2023  Allergen Reaction Noted   Codeine phosphate Other (See Comments) 03/15/2006   Mobic [meloxicam] Diarrhea 11/28/2011   Protonix [pantoprazole sodium] Diarrhea and Other (See Comments) 06/05/2012   Ibuprofen Diarrhea and Other (See Comments) 01/23/2022   Peppermint oil Other (See Comments) 01/23/2022   Aleve [naproxen] Nausea Only 05/15/2016   Colestipol hcl  05/24/2017   Omeprazole-sodium bicarbonate Diarrhea and Other (See Comments) 12/23/2008   Sodium Other (See Comments) 11/16/2017   Sulfamethoxazole Diarrhea and Nausea And Vomiting 03/15/2006    Past Medical History:  Diagnosis Date   Allergic rhinitis 07/11/2006   Back pain 10/09/2007   Blood transfusion without reported diagnosis    Diaphragmatic hernia 07/11/2006  Diverticulosis of colon    Eczema 07/11/2006   Elevated cholesterol 04/29/2012   Enteritis    Erythema nodosum    Tends to be seasonal has had a negative chest x-ray in the past negative PPD   Essential hypertension 07/11/2006   Fatigue 03/12/2007   Fibromyalgia    Gastroparesis 12/23/2008   Generalized anxiety disorder 09/01/2014   GERD (gastroesophageal reflux disease)    History of stricture and dilatation and Nissen surgery   Hearing loss, left ear 11/27/2008   History of cataract    bilateral; s/p surgery    Hyperglycemia 08/15/2006   Irritable bowel syndrome 12/23/2008   Leg cramps, nocturnal 11/11/2007   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   MCI (mild cognitive impairment) 10/22/2018   Obstructive sleep apnea 04/23/2007   uses CPAP nightly; mask has leak   Osteoarthritis 11/28/2011   Osteopenia    Prediabetes 04/19/2011   Vitamin B12 deficiency 05/13/2010    Past Surgical History:  Procedure Laterality Date   ABDOMINAL HYSTERECTOMY     bone spur     CATARACT EXTRACTION W/ INTRAOCULAR LENS  IMPLANT, BILATERAL     CESAREAN SECTION     x 1   CHOLECYSTECTOMY     COLONOSCOPY     DENTAL SURGERY     ESOPHAGOGASTRODUODENOSCOPY     stricture and dilatation   IR KYPHO EA ADDL LEVEL THORACIC OR LUMBAR  04/28/2022   IR KYPHO THORACIC WITH BONE BIOPSY  04/28/2022   IR RADIOLOGIST EVAL & MGMT  04/20/2022   IR RADIOLOGIST EVAL & MGMT  05/19/2022   left shoulder surgery  06/08, 07/12/17   x 2   MOUTH SURGERY Right 06/03/2019   NISSEN FUNDOPLICATION     ROTATOR CUFF REPAIR Left    rt knee surgery     scope   UPPER GASTROINTESTINAL ENDOSCOPY      Family History  Problem Relation Age of Onset   Depression Mother    Kidney cancer Mother    Dementia Mother        Unspecified, likely secondary to other medical condition (cancer)   Depression Father    Hiatal hernia Father    COPD Sister    Dementia Sister        Unspecified, likely secondary to other medical condition   Dementia Brother    Heart Problems Brother    Nephrolithiasis Other    Colon cancer Neg Hx    Esophageal cancer Neg Hx    Pancreatic cancer Neg Hx    Rectal cancer Neg Hx    Stomach cancer Neg Hx     Social History   Socioeconomic History   Marital status: Married    Spouse name: Not on file   Number of children: 1   Years of education: 14   Highest education level: Some college, no degree  Occupational History   Occupation: Retired  Tobacco Use   Smoking status: Never   Smokeless tobacco: Never  Vaping Use    Vaping status: Never Used  Substance and Sexual Activity   Alcohol use: No    Alcohol/week: 0.0 standard drinks of alcohol   Drug use: No   Sexual activity: Not Currently  Other Topics Concern   Not on file  Social History Narrative   Unemployed   Married   HH of 2    Has grandchildren out of state   No pets   Sis. died November 13, 2011COPD   Ranch home   Right handed   Social  Drivers of Health   Financial Resource Strain: Low Risk  (09/28/2022)   Overall Financial Resource Strain (CARDIA)    Difficulty of Paying Living Expenses: Not hard at all  Food Insecurity: No Food Insecurity (01/23/2022)   Hunger Vital Sign    Worried About Running Out of Food in the Last Year: Never true    Ran Out of Food in the Last Year: Never true  Transportation Needs: No Transportation Needs (01/23/2022)   PRAPARE - Administrator, Civil Service (Medical): No    Lack of Transportation (Non-Medical): No  Physical Activity: Sufficiently Active (09/28/2022)   Exercise Vital Sign    Days of Exercise per Week: 7 days    Minutes of Exercise per Session: 30 min  Stress: Stress Concern Present (09/28/2022)   [employer] of Occupational Health - Occupational Stress Questionnaire    Feeling of Stress : Very much  Social Connections: Moderately Isolated (09/28/2022)   Social Connection and Isolation Panel [NHANES]    Frequency of Communication with Friends and Family: Three times a week    Frequency of Social Gatherings with Friends and Family: Never    Attends Religious Services: Never    Database administrator or Organizations: No    Attends Banker Meetings: Never    Marital Status: Married  Catering manager Violence: Not At Risk (01/23/2022)   Humiliation, Afraid, Rape, and Kick questionnaire    Fear of Current or Ex-Partner: No    Emotionally Abused: No    Physically Abused: No    Sexually Abused: No    Review of Systems  HENT:  Positive for drooling.   Eyes:  Negative.   Respiratory:  Positive for apnea and shortness of breath.   Cardiovascular: Negative.   Gastrointestinal: Negative.   Endocrine: Negative.   Musculoskeletal:  Positive for arthralgias and myalgias.  Psychiatric/Behavioral:  Positive for sleep disturbance.     Vitals:   03/22/23 1513  BP: (!) 140/81  Pulse: 71  SpO2: 99%   Physical Exam Constitutional:      Appearance: Normal appearance. She is well-developed.  HENT:     Head: Normocephalic and atraumatic.     Mouth/Throat:     Mouth: Mucous membranes are moist.  Eyes:     General:        Right eye: No discharge.        Left eye: No discharge.  Neck:     Thyroid: No thyromegaly.  Cardiovascular:     Rate and Rhythm: Normal rate.  Pulmonary:     Effort: Pulmonary effort is normal. No respiratory distress.     Breath sounds: Normal breath sounds. No stridor. No wheezing or rhonchi.  Musculoskeletal:        General: Normal range of motion.     Cervical back: No rigidity or tenderness.  Neurological:     Mental Status: She is alert.  Psychiatric:        Mood and Affect: Mood normal.       03/15/2018    1:00 PM 02/13/2018   11:00 AM 09/25/2017    3:00 PM  Results of the Epworth flowsheet  Sitting and reading 0 0 0  Watching TV 3 3 1   Sitting, inactive in a public place (e.g. a theatre or a meeting) 0 0 0  As a passenger in a car for an hour without a break 0 0 0  Lying down to rest in the afternoon when circumstances permit 3 3  2  Sitting and talking to someone 0 0 0  Sitting quietly after a lunch without alcohol 0 0 0  In a car, while stopped for a few minutes in traffic 0 0 0  Total score 6 6 3    Recent sleep study 02/11/2023 -Severe sleep apnea, very poor sleep efficiency, central sleep apnea  Assessment:  Obstructive sleep apnea for which she was titrated to pressure of 18/14, most recently decreased to 14/10 -She is still not tolerating the pressures -Has not been using the BiPAP  recently  Continues to have multiple awakenings with nonrestrictive sleep  Multifactorial daytime sleepiness  Continues to have a lot of fatigue  Multiple awakenings due to having to use the restroom  With significant weight loss, I believe she will be best served by repeating a sleep study to ascertain severity of sleep disordered breathing and optimal pressure settings for the same  Plan/Recommendations:  Will try to use current BiPAP with new mask that was required  Will with trying to do is get a download in about 4 weeks to see whether she is able to find a mask that fits well and that she is able to tolerate  If unable to tolerate any masks and this is actually contributing to her not being able to get enough sleep, we have to consider discontinuing BiPAP therapy  Follow-up in about 4 to 6 weeks  Spouse was encouraged to give [place] a call/message if pressures are not well-tolerated in which case we can decrease the pressures  Goal of treatment is still to get on a BiPAP pressure/therapy that does not disrupt her sleep or place her at risk of falls at night  Virl Diamond MD [facility] 03/22/2023, 5:54 PM  CC: Panosh, Neta Mends, MD

## 2023-03-22 NOTE — Patient Instructions (Signed)
 Try and use your CPAP nightly with one of the mask as tolerated  We will try and get a download in about 4 weeks  If you are still having a lot of central events Means we can send you back to the sleep lab to get titrated to a special machine  You may call us in a few weeks if the pressure seems too high  The most important thing is to find a mask that fits well  Follow-up in 4 to 6 weeks

## 2023-03-28 ENCOUNTER — Encounter: Payer: Self-pay | Admitting: Internal Medicine

## 2023-03-28 ENCOUNTER — Ambulatory Visit: Payer: Medicare Other | Admitting: Internal Medicine

## 2023-03-28 VITALS — BP 140/76 | HR 70 | Temp 97.7°F | Ht <= 58 in | Wt 94.0 lb

## 2023-03-28 DIAGNOSIS — R634 Abnormal weight loss: Secondary | ICD-10-CM | POA: Diagnosis not present

## 2023-03-28 DIAGNOSIS — Z79899 Other long term (current) drug therapy: Secondary | ICD-10-CM

## 2023-03-28 DIAGNOSIS — E876 Hypokalemia: Secondary | ICD-10-CM | POA: Diagnosis not present

## 2023-03-28 DIAGNOSIS — Z8781 Personal history of (healed) traumatic fracture: Secondary | ICD-10-CM

## 2023-03-28 DIAGNOSIS — I10 Essential (primary) hypertension: Secondary | ICD-10-CM | POA: Diagnosis not present

## 2023-03-28 LAB — CBC WITH DIFFERENTIAL/PLATELET
Basophils Absolute: 0.1 10*3/uL (ref 0.0–0.1)
Basophils Relative: 0.8 % (ref 0.0–3.0)
Eosinophils Absolute: 0.2 10*3/uL (ref 0.0–0.7)
Eosinophils Relative: 2.5 % (ref 0.0–5.0)
HCT: 40.1 % (ref 36.0–46.0)
Hemoglobin: 13 g/dL (ref 12.0–15.0)
Lymphocytes Relative: 20.2 % (ref 12.0–46.0)
Lymphs Abs: 1.5 10*3/uL (ref 0.7–4.0)
MCHC: 32.4 g/dL (ref 30.0–36.0)
MCV: 93.1 fl (ref 78.0–100.0)
Monocytes Absolute: 0.5 10*3/uL (ref 0.1–1.0)
Monocytes Relative: 6.4 % (ref 3.0–12.0)
Neutro Abs: 5.1 10*3/uL (ref 1.4–7.7)
Neutrophils Relative %: 70.1 % (ref 43.0–77.0)
Platelets: 250 10*3/uL (ref 150.0–400.0)
RBC: 4.31 Mil/uL (ref 3.87–5.11)
RDW: 13.8 % (ref 11.5–15.5)
WBC: 7.3 10*3/uL (ref 4.0–10.5)

## 2023-03-28 LAB — BASIC METABOLIC PANEL
BUN: 14 mg/dL (ref 6–23)
CO2: 31 meq/L (ref 19–32)
Calcium: 9.5 mg/dL (ref 8.4–10.5)
Chloride: 104 meq/L (ref 96–112)
Creatinine, Ser: 0.75 mg/dL (ref 0.40–1.20)
GFR: 76.98 mL/min (ref >60.00)
Glucose, Bld: 89 mg/dL (ref 70–99)
Potassium: 3.7 meq/L (ref 3.5–5.1)
Sodium: 142 meq/L (ref 135–145)

## 2023-03-28 MED ORDER — BUPROPION HCL ER (XL) 150 MG PO TB24: 150.0000 mg | ORAL_TABLET | Freq: Every day | ORAL | 3 refills | Status: AC

## 2023-03-28 MED ORDER — ESCITALOPRAM OXALATE 10 MG PO TABS: 15.0000 mg | ORAL_TABLET | Freq: Every day | ORAL | 3 refills | Status: DC

## 2023-03-28 MED ORDER — POTASSIUM CHLORIDE ER 10 MEQ PO TBCR: EXTENDED_RELEASE_TABLET | ORAL | 1 refills | Status: DC

## 2023-03-28 NOTE — Progress Notes (Signed)
 Potassium in ok range  and kidney function good  no anemia. No change in plan can continue on potassium for now

## 2023-03-28 NOTE — Patient Instructions (Signed)
 Continue lifestyle intervention  optimize nutrition   Will check potassium today to see if we need to remain on this med and monitoring .   Then    fu cpe or other in 3-4 months  or as indicated .   [name]

## 2023-03-28 NOTE — Progress Notes (Signed)
 Chief Complaint  Patient presents with   Medical Management of Chronic Issues    Pt is here with spouse for 6 months follow up.     HPI: Caitlin [name] 78 y.o. come in for Chronic disease management   here with spouse   Eating better since esophageal dilatation   now eats 2 good meals and cookies   for snacks.  Chocolate  ice cream.  Diet coke. Caffiene free.  Hamburgers  when goes out. Weight stable  getting pt and improving strength .  Talk about refills  Mood about the same  on lexapro and wellbutrin augmentation Taking potassium every other day and  on NO diuretic  swallowing tolerating ok  ROS: See pertinent positives and negatives per HPI.  Past Medical History:  Diagnosis Date   Allergic rhinitis 07/11/2006   Back pain 10/09/2007   Blood transfusion without reported diagnosis    Diaphragmatic hernia 07/11/2006   Diverticulosis of colon    Eczema 07/11/2006   Elevated cholesterol 04/29/2012   Enteritis    Erythema nodosum    Tends to be seasonal has had a negative chest x-ray in the past negative PPD   Essential hypertension 07/11/2006   Fatigue 03/12/2007   Fibromyalgia    Gastroparesis 12/23/2008   Generalized anxiety disorder 09/01/2014   GERD (gastroesophageal reflux disease)    History of stricture and dilatation and Nissen surgery   Hearing loss, left ear 11/27/2008   History of cataract    bilateral; s/p surgery   Hyperglycemia 08/15/2006   Irritable bowel syndrome 12/23/2008   Leg cramps, nocturnal 11/11/2007   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   MCI (mild cognitive impairment) 10/22/2018   Obstructive sleep apnea 04/23/2007   uses CPAP nightly; mask has leak   Osteoarthritis 11/28/2011   Osteopenia    Prediabetes 04/19/2011   Vitamin B12 deficiency 05/13/2010    Family History  Problem Relation Age of Onset   Depression Mother    Kidney cancer Mother    Dementia Mother        Unspecified, likely secondary to other medical condition  (cancer)   Depression Father    Hiatal hernia Father    COPD Sister    Dementia Sister        Unspecified, likely secondary to other medical condition   Dementia Brother    Heart Problems Brother    Nephrolithiasis Other    Colon cancer Neg Hx    Esophageal cancer Neg Hx    Pancreatic cancer Neg Hx    Rectal cancer Neg Hx    Stomach cancer Neg Hx     Social History   Socioeconomic History   Marital status: Married    Spouse name: Not on file   Number of children: 1   Years of education: 14   Highest education level: Associate degree: academic program  Occupational History   Occupation: Retired  Tobacco Use   Smoking status: Never   Smokeless tobacco: Never  Vaping Use   Vaping status: Never Used  Substance and Sexual Activity   Alcohol use: No    Alcohol/week: 0.0 standard drinks of alcohol   Drug use: No   Sexual activity: Not Currently  Other Topics Concern   Not on file  Social History Narrative   Unemployed   Married   HH of 2    Has grandchildren out of state   No pets   Sis. died 01-Dec-2011COPD   Ranch home   Right  handed   Social Drivers of Corporate investment banker Strain: Low Risk  (03/28/2023)   Overall Financial Resource Strain (CARDIA)    Difficulty of Paying Living Expenses: Not hard at all  Food Insecurity: No Food Insecurity (03/28/2023)   Hunger Vital Sign    Worried About Running Out of Food in the Last Year: Never true    Ran Out of Food in the Last Year: Never true  Transportation Needs: No Transportation Needs (03/28/2023)   PRAPARE - Administrator, Civil Service (Medical): No    Lack of Transportation (Non-Medical): No  Physical Activity: Inactive (03/28/2023)   Exercise Vital Sign    Days of Exercise per Week: 0 days    Minutes of Exercise per Session: 30 min  Stress: Stress Concern Present (03/28/2023)   [employer] of Occupational Health - Occupational Stress Questionnaire    Feeling of Stress : Rather much   Social Connections: Moderately Isolated (03/28/2023)   Social Connection and Isolation Panel [NHANES]    Frequency of Communication with Friends and Family: Once a week    Frequency of Social Gatherings with Friends and Family: Once a week    Attends Religious Services: 1 to 4 times per year    Active Member of [employer] or Organizations: No    Attends Banker Meetings: Never    Marital Status: Married    Outpatient Medications Prior to Visit  Medication Sig Dispense Refill   acetaminophen (TYLENOL) 325 MG tablet Take 2 tablets (650 mg total) by mouth every 6 (six) hours as needed for mild pain (or Fever >/= 101).     Cholecalciferol (VITAMIN D3) 1000 units CAPS Take 1,000 Units by mouth daily.     Cyanocobalamin (VITAMIN B-12) 1000 MCG SUBL Place 2,000 mcg under the tongue. Every other day     Docusate Calcium (STOOL SOFTENER PO) Take by mouth. Every other day     famotidine (PEPCID) 40 MG tablet Take 1 tablet by mouth once daily 90 tablet 0   FIBER PO Take by mouth daily. Benefiber powder     memantine (NAMENDA) 10 MG tablet Take 1 tablet (10 mg total) by mouth 2 (two) times daily. 60 tablet 11   Multiple Vitamins-Minerals (PRESERVISION AREDS 2) CAPS Take 1 capsule by mouth 2 (two) times a week.     pantoprazole (PROTONIX) 40 MG tablet Take 1 tablet (40 mg total) by mouth daily. 90 tablet 3   SIMPLY SALINE 0.9 % AERS Place 1 spray into both nostrils in the morning and at bedtime.     buPROPion (WELLBUTRIN XL) 150 MG 24 hr tablet Take 1 tablet (150 mg total) by mouth daily. 90 tablet 0   escitalopram (LEXAPRO) 10 MG tablet Take 1.5 tablets (15 mg total) by mouth daily. 135 tablet 0   potassium chloride (KLOR-CON) 10 MEQ tablet TAKE 1 TABLET BY MOUTH EVERY OTHER DAY WITH  FLUID  TABLET 30 tablet 0   No facility-administered medications prior to visit.     EXAM:  BP (!) 140/76 (BP Location: Left Arm, Patient Position: Sitting, Cuff Size: Normal)   Pulse 70   Temp 97.7 F  (36.5 C) (Oral)   Ht 4\' 8"  (1.422 m)   Wt 94 lb (42.6 kg)   SpO2 93%   BMI 21.07 kg/m   Body mass index is 21.07 kg/m.  GENERAL: vitals reviewed and listed above, alert, , appears well hydrated and in no acute distress spouse with additional hx but  able to answer  simple  conversation HEENT: atraumatic, conjunctiva  clear, no obvious abnormalities on inspection of external nose and ears  NECK: no obvious masses on inspection palpation  LUNGS: clear to auscultation bilaterally, no wheezes, rales or rhonchi, good air movement CV: HRRR, no clubbing cyanosis or  peripheral edema nl cap refill  MS: moves all extremities without noticeable focal  abnormality Able to easily  maneuvering  the roller walker up from chair  PSYCH: pleasant and cooperative, no obvious depression or anxiety Lab Results  Component Value Date   WBC 7.3 03/28/2023   HGB 13.0 03/28/2023   HCT 40.1 03/28/2023   PLT 250.0 03/28/2023   GLUCOSE 89 03/28/2023   CHOL 182 07/25/2022   TRIG 109.0 07/25/2022   HDL 69.30 07/25/2022   LDLCALC 91 07/25/2022   ALT 8 07/25/2022   AST 13 07/25/2022   NA 142 03/28/2023   K 3.7 03/28/2023   CL 104 03/28/2023   CREATININE 0.75 03/28/2023   BUN 14 03/28/2023   CO2 31 03/28/2023   TSH 2.41 07/25/2022   HGBA1C 5.0 07/25/2022   MICROALBUR 1.6 04/26/2011   BP Readings from Last 3 Encounters:  03/28/23 (!) 140/76  03/22/23 (!) 140/81  03/05/23 (!) 154/83    ASSESSMENT AND PLAN:  Discussed the following assessment and plan:  Medication management - Plan: Basic metabolic panel, CBC with Differential/Platelet  Essential hypertension - Plan: Basic metabolic panel, CBC with Differential/Platelet  Weight loss - Plan: Basic metabolic panel, CBC with Differential/Platelet  Hypokalemia - Plan: Basic metabolic panel, CBC with Differential/Platelet  History of compression fracture of spine Update lab today  emphasize nutrition and activity to optimize  Struggling with osa  rx mask not fitting   in process.  -Patient advised to return or notify health care team  if  new concerns arise. Record review  after patient left  no recent  dexa and had kyphoplasty for subacute fx in early 2024  plan is to get updated dexa Patient Instructions  Continue lifestyle intervention  optimize nutrition   Will check potassium today to see if we need to remain on this med and monitoring .   Then    fu cpe or other in 3-4 months  or as indicated .   [name]  Neta Mends. Caitlin [name] M.D.

## 2023-04-01 ENCOUNTER — Encounter: Payer: Self-pay | Admitting: Internal Medicine

## 2023-04-12 ENCOUNTER — Telehealth: Payer: Self-pay

## 2023-04-12 DIAGNOSIS — G4733 Obstructive sleep apnea (adult) (pediatric): Secondary | ICD-10-CM

## 2023-04-12 NOTE — Telephone Encounter (Signed)
 Spoke with Willaim patient's husband regarding changing pressure on BIPAP. Patient is still having issues with BIPAP . Printed out Nurse, adult .  Dr.Olalere can you please advise   Thank you

## 2023-04-12 NOTE — Telephone Encounter (Signed)
 Copied from CRM [id number]. Topic: Clinical - Medical Advice >> Apr 12, 2023  1:07 PM Isabell A wrote: Reason for CRM: Spouse calling in regard to the pressure of patients CPAP, would like to know what it can be decreased to.   Callback number: [phone]

## 2023-04-15 ENCOUNTER — Telehealth: Payer: Self-pay | Admitting: Pulmonary Disease

## 2023-04-15 NOTE — Telephone Encounter (Signed)
 Download from the machine over the last 7 days BiPAP 14/10 Average use of 4 hours 32 minutes  AHI 24.4 Tidal volume to 86, respiratory rate 16.5, minute ventilation 5.4

## 2023-04-15 NOTE — Telephone Encounter (Signed)
 DME referral to change BiPAP pressures  Decrease BiPAP pressures from 14/10-12/8  New BiPAP pressures 12/8

## 2023-04-17 ENCOUNTER — Encounter: Payer: Self-pay | Admitting: Physician Assistant

## 2023-04-17 ENCOUNTER — Ambulatory Visit: Payer: Medicare Other | Admitting: Physician Assistant

## 2023-04-17 VITALS — BP 150/87 | HR 70 | Resp 20 | Ht <= 58 in | Wt 95.0 lb

## 2023-04-17 DIAGNOSIS — R413 Other amnesia: Secondary | ICD-10-CM | POA: Diagnosis not present

## 2023-04-17 NOTE — Patient Instructions (Addendum)
 It was a pleasure to see you today at our office.   Recommendations:   Neuropsychological evaluation for clarity of diagnosis  Continue Memantine10 mg tablets at night Follow up in  6 months   For psychiatric meds, mood meds: Please have your primary care physician manage these medications.  If you have any severe symptoms of a stroke, or other severe issues such as confusion,severe chills or fever, etc call 911 or go to the ER as you may need to be evaluated further    For assessment of decision of mental capacity and competency:  Call Dr. Erick Blinks, geriatric psychiatrist at [phone]  Counseling regarding caregiver distress, including caregiver depression, anxiety and issues regarding community resources, adult day care programs, adult living facilities, or memory care questions:  please contact your  Primary Doctor's Social Worker   Whom to call: Memory  decline, memory medications: Call our office [phone]    [email or web address]   RECOMMENDATIONS FOR ALL PATIENTS WITH MEMORY PROBLEMS: 1. Continue to exercise (Recommend 30 minutes of walking everyday, or 3 hours every week) 2. Increase social interactions - continue going to [place] and enjoy social gatherings with friends and family 3. Eat healthy, avoid fried foods and eat more fruits and vegetables 4. Maintain adequate blood pressure, blood sugar, and blood cholesterol level. Reducing the risk of stroke and cardiovascular disease also helps promoting better memory. 5. Avoid stressful situations. Live a simple life and avoid aggravations. Organize your time and prepare for the next day in anticipation. 6. Sleep well, avoid any interruptions of sleep and avoid any distractions in the bedroom that may interfere with adequate sleep quality 7. Avoid sugar, avoid sweets as there is a strong link between excessive sugar intake, diabetes, and cognitive impairment We  discussed the Mediterranean diet, which has been shown to help patients reduce the risk of progressive memory disorders and reduces cardiovascular risk. This includes eating fish, eat fruits and green leafy vegetables, nuts like almonds and hazelnuts, walnuts, and also use olive oil. Avoid fast foods and fried foods as much as possible. Avoid sweets and sugar as sugar use has been linked to worsening of memory function.  There is always a concern of gradual progression of memory problems. If this is the case, then we may need to adjust level of care according to patient needs. Support, both to the patient and caregiver, should then be put into place.      You have been referred for a neuropsychological evaluation (i.e., evaluation of memory and thinking abilities). Please bring someone with you to this appointment if possible, as it is helpful for the doctor to hear from both you and another adult who knows you well. Please bring eyeglasses and hearing aids if you wear them.    The evaluation will take approximately 3 hours and has two parts:   The first part is a clinical interview with the neuropsychologist (Dr. Milbert Coulter or Dr. Dr. Robbie Lis). During the interview, the neuropsychologist will speak with you and the individual you brought to the appointment.    The second part of the evaluation is testing with the doctor's technician Annabelle Harman or Selena Batten). During the testing, the technician will ask you to remember different types of material, solve problems, and answer some questionnaires. Your family member will not be present for this portion of the evaluation.   Please note: We must reserve several hours of the neuropsychologist's time and the psychometrician's time for your evaluation appointment. As such, there is a No-Show  fee of $100. If you are unable to attend any of your appointments, please contact our office as soon as possible to reschedule.      DRIVING: Regarding driving, in patients with  progressive memory problems, driving will be impaired. We advise to have someone else do the driving if trouble finding directions or if minor accidents are reported. Independent driving assessment is available to determine safety of driving.   If you are interested in the driving assessment, you can contact the following:  [facility] [employer] [facility] in [place] [phone]  [facility] [phone]  [facility] [phone]  [facility] [phone] or [phone]   FALL PRECAUTIONS: Be cautious when walking. Scan the area for obstacles that may increase the risk of trips and falls. When getting up in the mornings, sit up at the edge of the bed for a few minutes before getting out of bed. Consider elevating the bed at the head end to avoid drop of blood pressure when getting up. Walk always in a well-lit room (use night lights in the walls). Avoid area rugs or power cords from appliances in the middle of the walkways. Use a walker or a cane if necessary and consider physical therapy for balance exercise. Get your eyesight checked regularly.  FINANCIAL OVERSIGHT: Supervision, especially oversight when making financial decisions or transactions is also recommended.  HOME SAFETY: Consider the safety of the kitchen when operating appliances like stoves, microwave oven, and blender. Consider having supervision and share cooking responsibilities until no longer able to participate in those. Accidents with firearms and other hazards in the house should be identified and addressed as well.   ABILITY TO BE LEFT ALONE: If patient is unable to contact 911 operator, consider using LifeLine, or when the need is there, arrange for someone to stay with patients. Smoking is a fire hazard, consider supervision or cessation. Risk of wandering should be assessed by caregiver and if detected at any point, supervision and safe proof recommendations should be  instituted.  MEDICATION SUPERVISION: Inability to self-administer medication needs to be constantly addressed. Implement a mechanism to ensure safe administration of the medications.      Mediterranean Diet A Mediterranean diet refers to food and lifestyle choices that are based on the traditions of countries located on the [employer] [place]. This way of eating has been shown to help prevent certain conditions and improve outcomes for people who have chronic diseases, like kidney disease and heart disease. What are tips for following this plan? Lifestyle  Cook and eat meals together with your family, when possible. Drink enough fluid to keep your urine clear or pale yellow. Be physically active every day. This includes: Aerobic exercise like running or swimming. Leisure activities like gardening, walking, or housework. Get 7-8 hours of sleep each night. If recommended by your health care provider, drink red wine in moderation. This means 1 glass a day for nonpregnant women and 2 glasses a day for men. A glass of wine equals 5 oz (150 mL). Reading food labels  Check the serving size of packaged foods. For foods such as rice and pasta, the serving size refers to the amount of cooked product, not dry. Check the total fat in packaged foods. Avoid foods that have saturated fat or trans fats. Check the ingredients list for added sugars, such as corn syrup. Shopping  At the grocery store, buy most of your food from the areas near the walls of the store. This includes: Fresh fruits and vegetables (  produce). Grains, beans, nuts, and seeds. Some of these may be available in unpackaged forms or large amounts (in bulk). Fresh seafood. Poultry and eggs. Low-fat dairy products. Buy whole ingredients instead of prepackaged foods. Buy fresh fruits and vegetables in-season from local farmers markets. Buy frozen fruits and vegetables in resealable bags. If you do not have access to quality fresh  seafood, buy precooked frozen shrimp or canned fish, such as tuna, salmon, or sardines. Buy small amounts of raw or cooked vegetables, salads, or olives from the deli or salad bar at your store. Stock your pantry so you always have certain foods on hand, such as olive oil, canned tuna, canned tomatoes, rice, pasta, and beans. Cooking  Cook foods with extra-virgin olive oil instead of using butter or other vegetable oils. Have meat as a side dish, and have vegetables or grains as your main dish. This means having meat in small portions or adding small amounts of meat to foods like pasta or stew. Use beans or vegetables instead of meat in common dishes like chili or lasagna. Experiment with different cooking methods. Try roasting or broiling vegetables instead of steaming or sauteing them. Add frozen vegetables to soups, stews, pasta, or rice. Add nuts or seeds for added healthy fat at each meal. You can add these to yogurt, salads, or vegetable dishes. Marinate fish or vegetables using olive oil, lemon juice, garlic, and fresh herbs. Meal planning  Plan to eat 1 vegetarian meal one day each week. Try to work up to 2 vegetarian meals, if possible. Eat seafood 2 or more times a week. Have healthy snacks readily available, such as: Vegetable sticks with hummus. Greek yogurt. Fruit and nut trail mix. Eat balanced meals throughout the week. This includes: Fruit: 2-3 servings a day Vegetables: 4-5 servings a day Low-fat dairy: 2 servings a day Fish, poultry, or lean meat: 1 serving a day Beans and legumes: 2 or more servings a week Nuts and seeds: 1-2 servings a day Whole grains: 6-8 servings a day Extra-virgin olive oil: 3-4 servings a day Limit red meat and sweets to only a few servings a month What are my food choices? Mediterranean diet Recommended Grains: Whole-grain pasta. Brown rice. Bulgar wheat. Polenta. Couscous. Whole-wheat bread. Orpah Cobb. Vegetables: Artichokes. Beets.  Broccoli. Cabbage. Carrots. Eggplant. Green beans. Chard. Kale. Spinach. Onions. Leeks. Peas. Squash. Tomatoes. Peppers. Radishes. Fruits: Apples. Apricots. Avocado. Berries. Bananas. Cherries. Dates. Figs. Grapes. Lemons. Melon. Oranges. Peaches. Plums. Pomegranate. Meats and other protein foods: Beans. Almonds. Sunflower seeds. Pine nuts. Peanuts. Cod. Salmon. Scallops. Shrimp. Tuna. Tilapia. Clams. Oysters. Eggs. Dairy: Low-fat milk. Cheese. Greek yogurt. Beverages: Water. Red wine. Herbal tea. Fats and oils: Extra virgin olive oil. Avocado oil. Grape seed oil. Sweets and desserts: [place] yogurt with honey. Baked apples. Poached pears. Trail mix. Seasoning and other foods: Basil. Cilantro. Coriander. Cumin. Mint. Parsley. Sage. Rosemary. Tarragon. Garlic. Oregano. Thyme. Pepper. Balsalmic vinegar. Tahini. Hummus. Tomato sauce. Olives. Mushrooms. Limit these Grains: Prepackaged pasta or rice dishes. Prepackaged cereal with added sugar. Vegetables: Deep fried potatoes (french fries). Fruits: Fruit canned in syrup. Meats and other protein foods: Beef. Pork. Lamb. Poultry with skin. Hot dogs. Tomasa Blase. Dairy: Ice cream. Sour cream. Whole milk. Beverages: Juice. Sugar-sweetened soft drinks. Beer. Liquor and spirits. Fats and oils: Butter. [place] oil. Vegetable oil. Beef fat (tallow). Lard. Sweets and desserts: Cookies. Cakes. Pies. Candy. Seasoning and other foods: Mayonnaise. Premade sauces and marinades. The items listed may not be a complete list. Talk with your  dietitian about what dietary choices are right for you. Summary The Mediterranean diet includes both food and lifestyle choices. Eat a variety of fresh fruits and vegetables, beans, nuts, seeds, and whole grains. Limit the amount of red meat and sweets that you eat. Talk with your health care provider about whether it is safe for you to drink red wine in moderation. This means 1 glass a day for nonpregnant women and 2 glasses a day for  men. A glass of wine equals 5 oz (150 mL). This information is not intended to replace advice given to you by your health care provider. Make sure you discuss any questions you have with your health care provider. Document Released: 08/26/2015 Document Revised: 09/28/2015 Document Reviewed: 08/26/2015 Elsevier Interactive Patient Education  2017 [employer].

## 2023-04-17 NOTE — Progress Notes (Signed)
 Assessment/Plan:   Dementia likely due to Alzheimer's disease   Caitlin [name] is a delightful 78 y.o. RH female with a history off hypertension, hyperlipidemia, fibromyalgia with chronic pain, history of eczema, GERD, IBS, OSA on BiPAP, osteoarthritis, prediabetes, B12 deficiency, and a diagnosis of MCI multifactorial in nature, dating back to 2016  by Neurological evaluation June 2022 seen today in follow up for memory loss. Patient is currently on memantine 10 mg twice daily.  Memory is stable.  Mood is controlled.    Follow up in 6  months. Continue memantine 10 milligrams twice daily, side effects discussed Follow-up with pulmonary for OSA, continue BiPAP Recommend good control of her cardiovascular risk factors Continue to control mood as per PCP     Subjective:    This patient is accompanied in the office by her husband and her daughter who supplements the history.  Previous records as well as any outside records available were reviewed prior to todays visit. Patient was last seen on 10/17/2022.  Last MMSE on 07/28/2022 was 16/30.    Any changes in memory since last visit? "About the same". Patient has some difficulty remembering recent conversations and names of people.   repeats oneself?  Endorsed Disoriented when walking into a room?  Patient denies    Leaving objects?  May misplace things but not in unusual places   Wandering behavior?  denies   Any personality changes since last visit?  denies   Any worsening depression?: Not any better but not worse.  Hallucinations or paranoia?  Denies.   Seizures? denies    Any sleep changes?  Denies vivid dreams, REM behavior or sleepwalking   Sleep apnea?   Denies.   Any hygiene concerns? Denies.  Independent of bathing and dressing?  Endorsed  Does the patient needs help with medications?  Husband  is in charge   Who is in charge of the finances?  Husband is in charge     Any changes in appetite?  Improved, small portions,  "working on protein".     Patient have trouble swallowing? Denies.   Does the patient cook? No Any headaches?   Denies.   Chronic back pain endorsed.  Ambulates with difficulty? In the house she walks without the walker, but outside she uses it Recent falls or head injuries? denies     Unilateral weakness, numbness or tingling? denies   Any tremors?  Denies   Any anosmia?  Denies   Any incontinence of urine?  Endorsed, wears diapers Any bowel dysfunction? Has IBS constipation     Patient lives with her husband   Does the patient drive? 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       Initial visit 07/28/22  How long did patient have memory difficulties? " After falling on Dec 1, she went downhill quickly"-husband says. " She had anesthesia for ventral hernia and her husband feels that it has contributed to symptoms.  Patient has difficulty remembering recent conversations and people names or new information . She cannot change channels or use the phone, numbers are difficult to her . She needs more help with her ADLS.  repeats oneself? Endorsed, "she has always been quiet",  but asks questions more often, especially with appts.Today she told him"I thought we were going to [employer]  as she forgot  that she was coming to this appointment. Disoriented when walking into a room?  At night she thinks is daytime. Denies except occasionally not remembering what patient came to the room for    Leaving objects in unusual places? May misplace some things. She collects napkins.    Wandering behavior? denies   Any personality changes ? Denies. Obsessed with wetness. " there is something wet in the ceiling", does not like water Any history of depression?: denies   Hallucinations or  paranoia? Over the last couple of weeks she has been seeing deceased relatives. Husband says that she has done so for a long time. Seizures? denies    Any sleep changes?  Does not sleep well, uses a sleeping pill. Denies  vivid dreams, REM behavior or sleepwalking   Sleep apnea?  Has not been using the CPAP Any hygiene concerns?  Uses the walker for the shower Independent of bathing and dressing? She starts getting dress and she does not remember how to complete the tasks, he has to help her otherwise she may stay with the pants half down. She chooses colors well, however.  Does the patient need help with medications? Husband  is in charge   Who is in charge of the finances? Husband  is in charge     Any changes in appetite?  Lost weight and "now they are working on my appetite. She has a GERD procedure ( ?dilatation) in August.    Patient have trouble swallowing she has esophageal narrowing which may cause some trouble with digestion. Does the patient cook?  No  Any headaches?  denies   Chronic back pain? Endorsed  Ambulates with difficulty? Needs a walker to ambulate, due to deconditioning. Doing PT. She has chronic cramps in the legs  Recent falls or head injuries? "After a fall she hit the hip without fracture but has pain in the area". She needs a walker to ambulate Vision changes? R eye floaters. Sees an eye doctor  Stroke like symptoms?  denies   Any tremors?  denies   Any anosmia?  denies   Any incontinence of urine? Endorsed, wears pads . She is afraid of getting wet , especially since the surgery.  Any bowel dysfunction? She has IBS.    Patient lives  with her husband  History of heavy alcohol intake? denies   History of heavy tobacco use? denies   Family history of dementia?  Sister has dementia, mother, unknown type.      Does patient drive? No longer drives   Neuropsychological evaluation 06/17/2020. Briefly, results suggested primary deficits surrounding both  attention/concentration and verbal fluency. Performance variability was further noted across processing speed and executive functions. Relative to her previous evaluation, good stability was exhibited across a majority of assessments. Mild declines were exhibited across attention/concentration, executive functioning, and phonemic fluency. Depressive symptoms across mood-based questionnaires were also noted to have worsened. An improvement was seen across her copy of a complex figure, likely due to improving visual acuity post cataract surgery. Across mood-related questionnaires, she reported acute levels of severe anxiety and severe depression. She also reported mild sleep dysfunction, likely related to her history of sleep apnea and difficulty finding a good-fitting CPAP mask. Overall, the most likely etiology of reported dysfunction continues to be a combination of significant psychiatric distress (recently worsened due to complications surrounding IBS), diffuse chronic pain related to her history of fibromyalgia, and chronic sleep dysfunction.    Ms. Gillentine was accompanied  by her husband during the current telephone call. They were within their residence while I was within my office. I discussed the limitations of evaluation and management by telemedicine and the availability of in person appointments. Ms. Harral expressed her understanding and agreed to proceed. Content of the current session focused on the results of her neuropsychological evaluation. Ms. Tweten and her husband were given the opportunity to ask questions and their questions were answered. They were encouraged to reach out should additional questions arise. A copy of her report was mailed at the conclusion of the visit.    History on Initial Assessment 09/01/2014: This is a 78 yo RH woman with a history of hypertension, diet-controlled hyperlipidemia, IBS, GERD, chronic neck and back pain, depression and anxiety, who presented for worsening  memory. She reports that memory changes started several years ago, she would be unable to think of a name or word she wanted to say. She has had to write everything down otherwise she would forget appointments. She recalls the first time this happened was 12 years ago, she got a reminder call about a dentist appointment 2 days prior but still missed it. She is has been stressed and worried because 2 of her husband's family members have recently passed away with dementia. She continues to drive without getting lost. She denies missing her regular medications but occasionally forgets her vitamins. She has to stay by the stove when cooking, one time she walked away from soup she was making and forgot about it. Her husband is in charge of bill payments. She has always had problems multi-tasking, but has noticed this has been worse the past couple of years, she easily loses her train of thought when distracted. Her maternal grandmother and 2 brothers were diagnosed with dementia. Her mother and sister had memory problems before they passed away from other medical conditions. She denies any significant head injuries, no alcohol use.    Diagnostic Data: MRI brain in 2012 was personally reviewed today, unremarkable with mild diffuse atrophy. Similar to MRI brain done 08/2014 as above.  PREVIOUS MEDICATIONS:   CURRENT MEDICATIONS:  Outpatient Encounter Medications as of 04/17/2023  Medication Sig   acetaminophen (TYLENOL) 325 MG tablet Take 2 tablets (650 mg total) by mouth every 6 (six) hours as needed for mild pain (or Fever >/= 101).   buPROPion (WELLBUTRIN XL) 150 MG 24 hr tablet Take 1 tablet (150 mg total) by mouth daily.   Cholecalciferol (VITAMIN D3) 1000 units CAPS Take 1,000 Units by mouth daily.   Cyanocobalamin (VITAMIN B-12) 1000 MCG SUBL Place 2,000 mcg under the tongue. Every other day   Docusate Calcium (STOOL SOFTENER PO) Take by mouth. Every other day   escitalopram (LEXAPRO) 10 MG tablet Take  1.5 tablets (15 mg total) by mouth daily.   famotidine (PEPCID) 40 MG tablet Take 1 tablet by mouth once daily   FIBER PO Take by mouth daily. Benefiber powder   memantine (NAMENDA) 10 MG tablet Take 1 tablet (10 mg total) by mouth 2 (two) times daily.   Multiple Vitamins-Minerals (PRESERVISION AREDS 2) CAPS Take 1 capsule by mouth 2 (two) times a week.   pantoprazole (PROTONIX) 40 MG tablet Take 1 tablet (40 mg total) by mouth daily.   potassium chloride (KLOR-CON) 10 MEQ tablet TAKE 1 TABLET BY MOUTH EVERY OTHER DAY   SIMPLY SALINE 0.9 % AERS Place 1 spray into both nostrils in the morning and at bedtime.   No facility-administered encounter medications on file  as of 04/17/2023.       07/28/2022    3:00 PM 04/04/2020    6:00 PM 03/12/2018    3:00 PM  MMSE - Mini Mental State Exam  Orientation to time 1 5 5   Orientation to Place 5 5 5   Registration 3 3 3   Attention/ Calculation 0 3 3  Recall 0 3 3  Language- name 2 objects 1 2 2   Language- repeat 1 1 1   Language- follow 3 step command 3 3 3   Language- read & follow direction 1 1 1   Write a sentence 0 1 1  Copy design 0 1 1  Total score 15 28 28        No data to display          Objective:     PHYSICAL EXAMINATION:    VITALS:   Vitals:   04/17/23 1436  BP: (!) 150/87  Pulse: 70  Resp: 20  SpO2: 97%  Weight: 95 lb (43.1 kg)  Height: 4\' 8"  (1.422 m)    GEN:  The patient appears stated age and is in NAD. HEENT:  Normocephalic, atraumatic.   Neurological examination:  General: NAD, well-groomed, appears stated age. Orientation: The patient is alert. Oriented to person, place and not to date Cranial nerves: There is good facial symmetry.The speech is fluent and clear. No aphasia or dysarthria. Fund of knowledge is appropriate. Recent and remote memory are impaired. Attention and concentration are reduced.  Able to name objects and repeat phrases.  Hearing is intact to conversational tone.   Sensation: Sensation is  intact to light touch throughout Motor: Strength is at least antigravity x4. DTR's 2/4 in UE/LE     Movement examination: Tone: There is normal tone in the UE/LE Abnormal movements:  no tremor.  No myoclonus.  No asterixis.   Coordination:  There is no decremation with RAM's. Normal finger to nose  Gait and Station: The patient has some difficulty arising out of a deep-seated chair without the use of the hands. The patient's stride length shorter. .  Gait is cautious and narrow.    Thank you for allowing [place] the opportunity to participate in the care of this nice patient. Please do not hesitate to contact us for any questions or concerns.   Total time spent on today's visit was 26 minutes dedicated to this patient today, preparing to see patient, examining the patient, ordering tests and/or medications and counseling the patient, documenting clinical information in the EHR or other health record, independently interpreting results and communicating results to the patient/family, discussing treatment and goals, answering patient's questions and coordinating care.  Cc:  Madelin Headings, MD  Marlowe Kays 04/17/2023 3:28 PM

## 2023-04-19 NOTE — Addendum Note (Signed)
 Addended by: Lanna Poche on: 04/19/2023 04:35 PM   Modules accepted: Orders

## 2023-04-25 ENCOUNTER — Telehealth: Payer: Self-pay

## 2023-04-25 DIAGNOSIS — E2839 Other primary ovarian failure: Secondary | ICD-10-CM

## 2023-04-25 DIAGNOSIS — Z8781 Personal history of (healed) traumatic fracture: Secondary | ICD-10-CM

## 2023-04-25 NOTE — Telephone Encounter (Signed)
 Contacted pt. Spoke to pt's husband.   He reports he is not sure but if pt has it should be in [facility] system.   Inform him we don't have an update dexa scan since the fracture in our system. He states he can schedule an appointment when pt is ready.   Inform him we can place the order for bone density. Once place, someone will contact them to schedule an appointment. Spouse verbalized understanding.   Okay for the place bone density?

## 2023-04-25 NOTE — Telephone Encounter (Signed)
-----   Message from Caitlin [name] sent at 04/01/2023 11:09 AM EDT ----- On record review I dont see an updated DEXA scan since she had a oast fracture  that should be updated. If she had one else where  please  get into record

## 2023-04-26 NOTE — Telephone Encounter (Signed)
 Signed order  for updated  dexa as discussed  I

## 2023-05-05 ENCOUNTER — Other Ambulatory Visit: Payer: Self-pay | Admitting: Gastroenterology

## 2023-05-08 ENCOUNTER — Ambulatory Visit: Admitting: Pulmonary Disease

## 2023-05-08 ENCOUNTER — Encounter: Payer: Self-pay | Admitting: Pulmonary Disease

## 2023-05-08 VITALS — BP 136/78 | HR 66 | Ht <= 58 in | Wt 98.0 lb

## 2023-05-08 DIAGNOSIS — G4733 Obstructive sleep apnea (adult) (pediatric): Secondary | ICD-10-CM

## 2023-05-08 NOTE — Progress Notes (Signed)
 Caitlin [name]    [id number]    08-Mar-1945  Primary Care Physician:Panosh, Joaquim Muir, MD  Referring Physician: Reginal Capra, MD [address] [place],  [place] [address]  Chief complaint:  History of obstructive sleep apnea on BiPAP therapy In for follow-up  HPI:  Recent hospitalization, was recently in rehab -Could not find a mask that fits  Has been trying to use BiPAP on a nightly basis She has not seen any significant difference in how she feels  Spouse notes that she is tolerating the lower pressures better  Still continues to have mask issues, has not been able to find a mask that fits well  Most recent sleep study was in January 2025 continues to have multiple awakenings at night  History of fibromyalgia Still very tired during the day  Study did reveal obstructive sleep apnea titrated to BiPAP of 18/14, last pressure change was to 14/10 because of pressure intolerance  Known history of obstructive sleep apnea  Weight loss recently with other health problems  Outpatient Encounter Medications as of 05/08/2023  Medication Sig   acetaminophen  (TYLENOL ) 325 MG tablet Take 2 tablets (650 mg total) by mouth every 6 (six) hours as needed for mild pain (or Fever >/= 101).   buPROPion  (WELLBUTRIN  XL) 150 MG 24 hr tablet Take 1 tablet (150 mg total) by mouth daily.   Cholecalciferol  (VITAMIN D3) 1000 units CAPS Take 1,000 Units by mouth daily.   Cyanocobalamin  (VITAMIN B-12) 1000 MCG SUBL Place 2,000 mcg under the tongue. Every other day   Docusate Calcium (STOOL SOFTENER PO) Take by mouth. Every other day   escitalopram  (LEXAPRO ) 10 MG tablet Take 1.5 tablets (15 mg total) by mouth daily.   famotidine  (PEPCID ) 40 MG tablet Take 1 tablet by mouth once daily   FIBER PO Take by mouth daily. Benefiber powder   memantine  (NAMENDA ) 10 MG tablet Take 1 tablet (10 mg total) by mouth 2 (two) times daily.   Multiple Vitamins-Minerals (PRESERVISION AREDS 2) CAPS  Take 1 capsule by mouth 2 (two) times a week.   pantoprazole  (PROTONIX ) 40 MG tablet Take 1 tablet (40 mg total) by mouth daily.   potassium chloride  (KLOR-CON ) 10 MEQ tablet TAKE 1 TABLET BY MOUTH EVERY OTHER DAY   SIMPLY SALINE 0.9 % AERS Place 1 spray into both nostrils in the morning and at bedtime.   No facility-administered encounter medications on file as of 05/08/2023.    Allergies as of 05/08/2023 - Review Complete 05/08/2023  Allergen Reaction Noted   Codeine phosphate Other (See Comments) 03/15/2006   Mobic  [meloxicam ] Diarrhea 11/28/2011   Protonix  [pantoprazole  sodium] Diarrhea and Other (See Comments) 06/05/2012   Ibuprofen Diarrhea and Other (See Comments) 01/23/2022   Peppermint oil Other (See Comments) 01/23/2022   Aleve [naproxen] Nausea Only 05/15/2016   Colestipol  hcl  05/24/2017   Omeprazole-sodium bicarbonate Diarrhea and Other (See Comments) 12/23/2008   Sodium Other (See Comments) 11/16/2017   Sulfamethoxazole Diarrhea and Nausea And Vomiting 03/15/2006    Past Medical History:  Diagnosis Date   Allergic rhinitis 07/11/2006   Back pain 10/09/2007   Blood transfusion without reported diagnosis    Diaphragmatic hernia 07/11/2006   Diverticulosis of colon    Eczema 07/11/2006   Elevated cholesterol 04/29/2012   Enteritis    Erythema nodosum    Tends to be seasonal has had a negative chest x-ray in the past negative PPD   Essential hypertension 07/11/2006  Fatigue 03/12/2007   Fibromyalgia    Gastroparesis 12/23/2008   Generalized anxiety disorder 09/01/2014   GERD (gastroesophageal reflux disease)    History of stricture and dilatation and Nissen surgery   Hearing loss, left ear 11/27/2008   History of cataract    bilateral; s/p surgery   Hyperglycemia 08/15/2006   Irritable bowel syndrome 12/23/2008   Leg cramps, nocturnal 11/11/2007   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   MCI (mild cognitive impairment) 10/22/2018   Obstructive sleep apnea  04/23/2007   uses CPAP nightly; mask has leak   Osteoarthritis 11/28/2011   Osteopenia    Prediabetes 04/19/2011   Vitamin B12 deficiency 05/13/2010    Past Surgical History:  Procedure Laterality Date   ABDOMINAL HYSTERECTOMY     bone spur     CATARACT EXTRACTION W/ INTRAOCULAR LENS  IMPLANT, BILATERAL     CESAREAN SECTION     x 1   CHOLECYSTECTOMY     COLONOSCOPY     DENTAL SURGERY     ESOPHAGOGASTRODUODENOSCOPY     stricture and dilatation   IR KYPHO EA ADDL LEVEL THORACIC OR LUMBAR  04/28/2022   IR KYPHO THORACIC WITH BONE BIOPSY  04/28/2022   IR RADIOLOGIST EVAL & MGMT  04/20/2022   IR RADIOLOGIST EVAL & MGMT  05/19/2022   left shoulder surgery  06/08, 07/12/17   x 2   MOUTH SURGERY Right 06/03/2019   NISSEN FUNDOPLICATION     ROTATOR CUFF REPAIR Left    rt knee surgery     scope   UPPER GASTROINTESTINAL ENDOSCOPY      Family History  Problem Relation Age of Onset   Depression Mother    Kidney cancer Mother    Dementia Mother        Unspecified, likely secondary to other medical condition (cancer)   Depression Father    Hiatal hernia Father    COPD Sister    Dementia Sister        Unspecified, likely secondary to other medical condition   Dementia Brother    Heart Problems Brother    Nephrolithiasis Other    Colon cancer Neg Hx    Esophageal cancer Neg Hx    Pancreatic cancer Neg Hx    Rectal cancer Neg Hx    Stomach cancer Neg Hx     Social History   Socioeconomic History   Marital status: Married    Spouse name: Not on file   Number of children: 1   Years of education: 14   Highest education level: Associate degree: academic program  Occupational History   Occupation: Retired  Tobacco Use   Smoking status: Never   Smokeless tobacco: Never  Vaping Use   Vaping status: Never Used  Substance and Sexual Activity   Alcohol use: No    Alcohol/week: 0.0 standard drinks of alcohol   Drug use: No   Sexual activity: Not Currently  Other Topics Concern    Not on file  Social History Narrative   Unemployed   Married   HH of 2    Has grandchildren out of state   No pets   Sis. died 2011-11-22COPD   Ranch home   Right handed   Social Drivers of Health   Financial Resource Strain: Low Risk  (03/28/2023)   Overall Financial Resource Strain (CARDIA)    Difficulty of Paying Living Expenses: Not hard at all  Food Insecurity: No Food Insecurity (03/28/2023)   Hunger Vital Sign  Worried About Programme researcher, broadcasting/film/video in the Last Year: Never true    Ran Out of Food in the Last Year: Never true  Transportation Needs: No Transportation Needs (03/28/2023)   PRAPARE - Administrator, Civil Service (Medical): No    Lack of Transportation (Non-Medical): No  Physical Activity: Inactive (03/28/2023)   Exercise Vital Sign    Days of Exercise per Week: 0 days    Minutes of Exercise per Session: 30 min  Stress: Stress Concern Present (03/28/2023)   [employer] of Occupational Health - Occupational Stress Questionnaire    Feeling of Stress : Rather much  Social Connections: Moderately Isolated (03/28/2023)   Social Connection and Isolation Panel [NHANES]    Frequency of Communication with Friends and Family: Once a week    Frequency of Social Gatherings with Friends and Family: Once a week    Attends Religious Services: 1 to 4 times per year    Active Member of [employer] or Organizations: No    Attends Banker Meetings: Never    Marital Status: Married  Catering manager Violence: Not At Risk (01/23/2022)   Humiliation, Afraid, Rape, and Kick questionnaire    Fear of Current or Ex-Partner: No    Emotionally Abused: No    Physically Abused: No    Sexually Abused: No    Review of Systems  HENT:  Positive for drooling.   Eyes: Negative.   Respiratory:  Positive for apnea and shortness of breath.   Cardiovascular: Negative.   Gastrointestinal: Negative.   Endocrine: Negative.   Musculoskeletal:  Positive for arthralgias  and myalgias.  Psychiatric/Behavioral:  Positive for sleep disturbance.     There were no vitals filed for this visit.  Physical Exam Constitutional:      Appearance: Normal appearance. She is well-developed.  HENT:     Head: Normocephalic and atraumatic.     Mouth/Throat:     Mouth: Mucous membranes are moist.  Eyes:     General:        Right eye: No discharge.        Left eye: No discharge.  Neck:     Thyroid : No thyromegaly.  Cardiovascular:     Rate and Rhythm: Normal rate.  Pulmonary:     Effort: Pulmonary effort is normal. No respiratory distress.     Breath sounds: Normal breath sounds. No stridor. No wheezing or rhonchi.  Musculoskeletal:        General: Normal range of motion.     Cervical back: No rigidity or tenderness.  Neurological:     Mental Status: She is alert.  Psychiatric:        Mood and Affect: Mood normal.       03/15/2018    1:00 PM 02/13/2018   11:00 AM 09/25/2017    3:00 PM  Results of the Epworth flowsheet  Sitting and reading 0 0 0  Watching TV 3 3 1   Sitting, inactive in a public place (e.g. a theatre or a meeting) 0 0 0  As a passenger in a car for an hour without a break 0 0 0  Lying down to rest in the afternoon when circumstances permit 3 3 2   Sitting and talking to someone 0 0 0  Sitting quietly after a lunch without alcohol 0 0 0  In a car, while stopped for a few minutes in traffic 0 0 0  Total score 6 6 3    Recent sleep study 02/11/2023 -Severe  sleep apnea, very poor sleep efficiency, central sleep apnea  Compliance data shows she has been trying to use the BiPAP over the last 2 weeks with average use of 4 hours 54 minutes BiPAP 12/8 still continues to have high AHI's  Assessment:  Obstructive sleep apnea for which she was titrated to pressure of 18/14, most recently decreased to 14/10 -14/10 not tolerated Currently on BiPAP of 12/8 which she is tolerating a little better  Continues to have multiple awakenings  Continues  to have a lot of fatigue and daytime sleepiness multiple awakenings relating to needing to use the restroom  Weight has stabilized  According to spouse appears to be tolerating decreased BiPAP pressure settings a little bit better   Plan/Recommendations:  Continue current BiPAP settings  Will send a DME referral for trial with a new BiPAP mask  Will accept decreased effectiveness for improved compliance  Follow-up in about 3 months  Encouraged to call with significant concerns  Hopefully DME can help with mask fitting  Myer Artis MD [facility] 05/08/2023, 2:17 PM  CC: Panosh, Wanda K, MD

## 2023-05-08 NOTE — Patient Instructions (Signed)
 Continue using your BiPAP  DME referral for trial with a new mask, patient requires a very small mask  Call us  with any significant concerns  Follow-up in about 3 months  We can attempt to change pressures up or down as needed for tolerance

## 2023-05-09 ENCOUNTER — Other Ambulatory Visit: Payer: Self-pay | Admitting: Gastroenterology

## 2023-06-01 ENCOUNTER — Telehealth (HOSPITAL_BASED_OUTPATIENT_CLINIC_OR_DEPARTMENT_OTHER): Payer: Self-pay

## 2023-06-01 DIAGNOSIS — G4733 Obstructive sleep apnea (adult) (pediatric): Secondary | ICD-10-CM

## 2023-06-01 NOTE — Telephone Encounter (Signed)
 Is order for new CPAP okay?   Copied from CRM [id number]. Topic: Clinical - Order For Equipment >> May 30, 2023  9:13 AM Caitlin [name] wrote: Reason for CRM:   Pt's husband, Caitlin [name], is contacting clinic concerning her CPAP machine. The machine has not been working properly and he was advised by [employer] due to her machine being over 77 years old they will not repair it.   He reports [employer] [facility] is requesting a new order for CPAP machine be sent so that insurance will cover the machine.   CB#  [phone]

## 2023-06-04 NOTE — Telephone Encounter (Signed)
 Order placed and pt spouse notified

## 2023-06-04 NOTE — Addendum Note (Signed)
 Addended by: Kary Pages on: 06/04/2023 08:50 AM   Modules accepted: Orders

## 2023-07-09 ENCOUNTER — Ambulatory Visit: Admitting: Psychology

## 2023-07-09 ENCOUNTER — Ambulatory Visit: Payer: Self-pay | Admitting: Psychology

## 2023-07-09 DIAGNOSIS — F418 Other specified anxiety disorders: Secondary | ICD-10-CM

## 2023-07-09 DIAGNOSIS — R4189 Other symptoms and signs involving cognitive functions and awareness: Secondary | ICD-10-CM

## 2023-07-09 DIAGNOSIS — F03A3 Unspecified dementia, mild, with mood disturbance: Secondary | ICD-10-CM

## 2023-07-09 NOTE — Progress Notes (Unsigned)
 NEUROPSYCHOLOGICAL EVALUATION [place]. [facility]  [place] Department of Neurology  Date of Evaluation: 07/09/2023  REASON FOR REFERRAL   Caitlin [name] is a 78 year old, right-handed, White female with 15 years of formal education. She was referred for neuropsychological re-evaluation by Camie Sevin, PA-C, to assess current neurocognitive functioning, document potential cognitive deficits, and assist with treatment planning. This is her first neuropsychological evaluation. She previously underwent neuropsychological evaluation with Arthea Maryland, Ph.D., at [facility] on 10/21/2018 and 06/17/2020. Results from that evaluation were used as a baseline for comparison.  SUMMARY OF RESULTS   ***  DIAGNOSTIC IMPRESSION   Results of the current evaluation ***  ICD-10 Codes: ***  RECOMMENDATIONS   A repeat neuropsychological evaluation in *** months (or sooner if functional decline is noted) is recommended.  ***In consultation with your doctor, schedule cognitive reevaluation on an as-needed basis to assess for cognitive decline and update treatment recommendations. Reevaluation should occur during a period of medical and affective stability.  ***Discuss with your neurologist the risks and benefits of starting a medication that can help slow memory decline.  ***Patient has already been prescribed a medication (i.e., ***) aimed at addressing memory loss and concerns surrounding Alzheimer's disease. She is encouraged to continue taking this medication as prescribed. It is important to highlight that this medication has been shown to slow functional decline in some individuals.  ***Consider additional brain imaging. FDG-PET scan may be helpful to characterize regional hypometabolism suggestive of underlying  pathology.  {KDEISSDRIVINGRECS:32201}  {KDEISSDEMENTIARECS:32187}  {KDEISSHEALTHYLIVINGRECS:32196}  {KDEISSPSYCHRECS:32202}  {KDEISSREFERRALRECS:32193}  {KDEISSSLEEPRECS:32198}  {KDEISSSENSORYRECS:32189}  {KDEISSACPRECS (Optional):32200}  {KDEISSCOGRECS:32216}  DISPOSITION   Patient will follow up with the referring provider, Ms. Wertman. {KDEISSFOLLOWUP:32247}. She and her family will be provided verbal feedback in approximately one week regarding the findings and impression during this visit.  The remainder of the report includes the details of the patient's background and a table of results from the current evaluation, which support the summary and recommendations described above.  BACKGROUND   History of Presenting Illness: The following information was obtained from a review of medical records and an interview with the patient, her husband, Caitlin [name], and her daughter, Caitlin [name]. Please refer to the medical record for more comprehensive review of relevant background factors. Patient initially established care at [facility] in 2016 due to memory concerns and underwent two neuropsychological assessments in 2020 and 2022 (see below). She appeared to have stable mild cognitive impairment until January 2024, when she sustained a fall. The day following the fall, she presented to the emergency department with persistent pain and mobility issues secondary to compression fractures. She had also experience a decline in overall health, accompanied by nausea and vomiting. She was noted to be hypoxic prior to arrival and required supplemental oxygen. Imaging revealed compression fractures at L2 and T12. Nonoperative management was required. Several months later, in July 2024, she followed up with neurology for an evaluation of cognitive decline. At that time, her MMSE was 15/30, and it was noted that, in addition to cognitive decline, she required increased functional assistance.  Neuropsychological re-evaluation was recommended for diagnostic clarification.  Previous Neuropsychological Evaluations:   Per Arthea Maryland, Ph.D. (06/17/2020): Briefly, results suggested primary deficits surrounding both attention/concentration and verbal fluency. Performance variability was further noted across processing speed and executive functions. Relative to her previous evaluation, good stability was exhibited across a majority of assessments. Mild declines were exhibited across attention/concentration, executive functioning, and phonemic fluency. Depressive symptoms across mood-based questionnaires were also noted  to have worsened. An improvement was seen across her copy of a complex figure, likely due to improving visual acuity post cataract surgery. Across mood-related questionnaires, she reported acute levels of severe anxiety and severe depression. She also reported mild sleep dysfunction, likely related to her history of sleep apnea and difficulty finding a good-fitting CPAP mask. Overall, the most likely etiology of reported dysfunction continues to be a combination of significant psychiatric distress (recently worsened due to complications surrounding IBS), diffuse chronic pain related to her history of fibromyalgia, and chronic sleep dysfunction.  Per Arthea Maryland, Ph.D. (10/21/2018): Briefly, results suggested consistent weakness across verbal fluency tasks, as well as notable performance variability across several cognitive domains, including processing speed, executive functioning, and encoding (i.e., learning) aspects of memory. Performance across visuospatial tasks declined with increased complexity, likely due to the presence of bilateral cataracts and ongoing visual acuity difficulties. 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  Factors which can create and maintain cognitive inefficiencies include significant psychiatric distress (she reported severe rates of acute anxiety and mild rates of acute depression), chronic pain due to fibromyalgia, and variable sleep disturbances.  Cognitive Functioning: During today's appointment, the patient and her family noted significant cognitive decline subsequent to the fall in January 2024, with relative improvement following hospitalization and initiation of memantine . However, the patient continues to experience intermittent cognitive fogginess, which appears to be largely related to symptoms of fibromyalgia and irritable bowel syndrome. Specific cognitive changes noted by the patient and her family include losing her train of thought during conversations, difficulty tracking tasks or conversations when interrupted, challenges with multitasking, word-finding difficulties, misplacing items, and occasionally forgetting details of conversations. These symptoms fluctuate depending on how she feels on any given day, particularly during foggy days. Family is uncertain about any navigation difficulties, as the patient is never alone. They also report intermittent confusion. For example, the patient has recently experienced episodes of confusion regarding how to warm the water in the shower (i.e., does not recognize that she needs to turn the knob from cold to hot). She also occasionally believes her daughter is in the house when she is not.  Physical Functioning: Patient endorsed longstanding insomnia. She uses a BiPAP machine; although she went a year without it, she has recently resumed use, but the mask is leaking. Her daughter reported intermittent episodes of hypersomnia without a consistent pattern. It is easy for the patient to overexert herself , and when this occurs, she experiences significant fatigue the following day. Appetite is stable, though the patient is more mindful of what she eats  due to irritable bowel syndrome. She has lost 33 pounds. No changes to sense of taste or smell were reported. Patient acknowledged a decline in her vision and the need to obtain new prescription glasses. She has noticed some hearing decline; however, a recent hearing test indicated no need for hearing aids. She continues to experience balance difficulties and has sustained several falls, although none recently. She previously participated in physical therapy. She denied tremor.  Emotional Functioning: Patient described her recent mood as okay most of the time, though she continues to experience intermittent depression and anxiety, primarily related to concerns about being a burden to her family due to physical decline. She denied suicidal ideation. Regarding hallucinations, the patient appears to have possibly experienced hallucinations during hospitalization and around the time her memantine  dose  was increased. Her husband also reported that she occasionally has a sensation that her daughter is in the house when she is not. No hallucinations have been reported in the past six months.  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  Other Relevant Medical History: Remarkable for fibromyalgia, hypertension, prediabetes, obstructive sleep apnea, osteoarthritis, gastroparesis, irritable bowel syndrome, and gastroesophageal reflux disease. Please refer to the medical record for a more detailed problem list. Patient sustained a significant head injury from a fall in April 2023; she denied loss of consciousness or persistent cognitive deficits. No history of stroke,  CNS infection, or seizure was reported.  Current Medications: Per record, acetaminophen , bupropion , docusate calcium, escitalopram , famotidine , fiber, memantine , multiple vitamins-minerals, pantoprazole , potassium chloride , Simply Saline, vitamin B12, and vitamin D3.  Functional Status: Patient discontinued driving following hospitalization. Her husband has assumed responsibility for managing her medications at her physician's recommendation. Her daughter believes that, without his assistance, the patient would not be able to reliably adhere to her medication regimen. While the patient has experienced some limitations in other areas of functioning, these appear to be primarily related to physical strength. Her husband manages the finances, but this is longstanding. Patient remains independent in all basic activities of daily living.  Family Neurological History: Remarkable for unspecified dementia (mother, brother, sister).  Psychiatric History: Remarkable for anxiety and longstanding depression, currently managed with medication. History of counseling, suicidal ideation, hallucinations, and psychiatric hospitalizations was not reported.  Substance Use History: Patient denied current use of alcohol, nicotine, marijuana, and illicit substances.  Social and Developmental History: History of perinatal complications and developmental delays was not reported. Patient currently lives with her husband in a senior friendly house. She has one daughter.  Educational and Occupational History:  Per prior neuropsychological evaluation: Highest level of educational attainment: 15 years. Patient graduated from high school and completed an additional three years at [employer] college working towards an Tourist information centre manager. She described herself as a strong (mostly A) student in academic settings. 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 History  of developmental delay: Denied. History of grade repetition: Denied. Enrollment in special education courses: Endorsed. She reported being enrolled in speech courses in early academic settings due to being born tongue-tied and having her tongue clipping procedure shortly before starting school. History of LD/ADHD: Denied. Employment: Retired. Patient previously worked as an Airline pilot for 25-30 years.   BEHAVIORAL OBSERVATIONS   Patient arrived on time and was accompanied by her husband, Caitlin [name], and her daughter, Caitlin [name]. She ambulated with a walker. She was alert and oriented with the exception of the exact date. She was appropriately groomed and dressed for the setting. No significant sensory or motor abnormalities were observed. Vision (with glasses) and hearing were adequate for testing purposes. Speech was of normal rate, prosody, and volume. No conversational word-finding difficulties, paraphasic errors, or dysarthria were observed. Comprehension was conversationally intact. Thought processes were linear, logical, and coherent. Thought content was organized and devoid of delusions. Insight appeared appropriate. Affect was even and congruent with mood. She was cooperative and gave adequate effort during testing, including on standalone and embedded measures of performance validity***. Results are thought to  accurately reflect her cognitive functioning at this time.  NEUROPSYCHOLOGICAL TESTING RESULTS   Tests Administered: {KDEISSNPTESTS:32266}  Test results are provided in the table below. Whenever possible, the patient's scores were compared against age-, sex-, and education-corrected normative samples. Interpretive descriptions are based on the AACN consensus conference statement on uniform labeling (Guilmette et al., 2020).  ***  INFORMED CONSENT   Patient was provided with a verbal description of the nature and purpose of the neuropsychological evaluation. Also reviewed were the foreseeable  risks and/or discomforts and benefits of the procedure, limits of confidentiality, and mandatory reporting requirements of this provider. Patient was given the opportunity to have their questions answered. Oral consent to participate was provided by the patient.   This report was prepared as part of a clinical evaluation and is not intended for forensic use.  SERVICE   This evaluation was conducted by Renda Beckwith, Psy.D. In addition to time spent directly with the patient, total professional time (180 minutes) includes record review, integration of relevant medical history, test selection, interpretation of findings, and report preparation. A technician, Lonell Jude, B.S., provided testing and scoring assistance for *** minutes.  Psychiatric Diagnostic Evaluation Services (Professional): 09208 x 1 Neuropsychological Testing Evaluation Services (Professional): 03867 x 1 Neuropsychological Testing Evaluation Services (Professional): 03866 x 2 Neuropsychological Test Administration and Scoring (Technician): [phone] x 1 Neuropsychological Test Administration and Scoring (Technician): [phone] x ***  This report was generated using voice recognition software. While this document has been carefully reviewed, transcription errors may be present. I apologize in advance for any inconvenience. Please contact me if further clarification is needed.            Renda Beckwith, Psy.D.             Neuropsychologist

## 2023-07-09 NOTE — Progress Notes (Signed)
   Psychometrician Note   Cognitive testing was administered to Caitlin [name] by Lonell Jude, B.S. (psychometrist) under the supervision of Dr. Renda Beckwith, Psy.D., licensed psychologist on 07/09/2023. Ms. Shedlock did not appear overtly distressed by the testing session per behavioral observation or responses across self-report questionnaires. Rest breaks were offered.    The battery of tests administered was selected by Dr. Renda Beckwith, Psy.D. with consideration to Ms. Carfagno's current level of functioning, the nature of her symptoms, emotional and behavioral responses during interview, level of literacy, observed level of motivation/effort, and the nature of the referral question. This battery was communicated to the psychometrist. Communication between Dr. Renda Beckwith, Psy.D. and the psychometrist was ongoing throughout the evaluation and Dr. Renda Beckwith, Psy.D. was immediately accessible at all times. Dr. Renda Beckwith, Psy.D. provided supervision to the psychometrist on the date of this service to the extent necessary to assure the quality of all services provided.    Caitlin [name] will return within approximately 1-2 weeks for an interactive feedback session with Dr. Beckwith at which time her test performances, clinical impressions, and treatment recommendations will be reviewed in detail. Ms. Deahl understands she can contact our office should she require our assistance before this time.  A total of 120 minutes of billable time were spent face-to-face with Ms. Goodenow by the psychometrist. This includes both test administration and scoring time. Billing for these services is reflected in the clinical report generated by Dr. Renda Beckwith, Psy.D.  This note reflects time spent with the psychometrician and does not include test scores or any clinical interpretations made by Dr. Beckwith. The full report will follow in a separate note.

## 2023-07-16 ENCOUNTER — Ambulatory Visit: Admitting: Psychology

## 2023-07-16 DIAGNOSIS — F03A3 Unspecified dementia, mild, with mood disturbance: Secondary | ICD-10-CM

## 2023-07-16 DIAGNOSIS — F418 Other specified anxiety disorders: Secondary | ICD-10-CM | POA: Diagnosis not present

## 2023-07-16 NOTE — Progress Notes (Signed)
   NEUROPSYCHOLOGY FEEDBACK SESSION [place]  [place] Department of Neurology  Date of Feedback Session: 07/16/2023  REASON FOR REFERRAL   Addison Freimuth is a 78 year old, right-handed, White female with 15 years of formal education. She was referred for neuropsychological re-evaluation by Camie Sevin, PA-C, to assess current neurocognitive functioning, document potential cognitive deficits, and assist with treatment planning. She previously underwent neuropsychological evaluation with Arthea Maryland, Ph.D., at [facility] on 10/21/2018 and 06/17/2020. Results from that evaluation were used as a baseline for comparison.  FEEDBACK   Patient completed a comprehensive neuropsychological evaluation on 07/09/2023. Please refer to that encounter for the full report and recommendations. Briefly, results indicated diffuse cognitive deficits, with the most prominent impairments observed in executive functioning. Compared to prior evaluations in 2020 and 2022, scores trended downward in several domains, particularly executive functioning, processing speed, and attention/working memory. Functional independence has also declined, due to a combination of physical and cognitive factors. 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 As noted in prior evaluations, the etiology of her deficits is likely multifactorial, including significant psychiatric distress, chronic pain, longstanding insomnia, and moderate cerebrovascular pathology. Fall followed by hospitalization in early 2024 may have contributed to the decline through secondary effects--such as physical deconditioning and worsening psychiatric and sleep symptoms--rather than reflecting progression of a neurodegenerative disorder. At this time, the cognitive profile is not suggestive of an underlying Alzheimer's disease process, though this cannot be definitively ruled out.   Today, the patient was  accompanied by her husband and daughter. They were provided verbal feedback regarding the findings and impression during this visit, and their questions were answered. A copy of the report was provided at the conclusion of the visit.  DISPOSITION   Patient will follow up with the referring provider, Ms. Wertman. No follow-up neuropsychological testing was scheduled at this time. Please feel free to refer the patient for repeated evaluation if she shows a significant change in neurocognitive status.  SERVICE   This feedback session was conducted by Renda Beckwith, Psy.D. One unit of 03867 (55 minutes) was billed for Dr. Beckwith' time spent in preparing, conducting, and documenting the current feedback session.  This report was generated using voice recognition software. While this document has been carefully reviewed, transcription errors may be present. I apologize in advance for any inconvenience. Please contact me if further clarification is needed.

## 2023-07-31 ENCOUNTER — Other Ambulatory Visit: Payer: Self-pay | Admitting: Gastroenterology

## 2023-07-31 ENCOUNTER — Encounter: Payer: Self-pay | Admitting: Internal Medicine

## 2023-07-31 ENCOUNTER — Ambulatory Visit (INDEPENDENT_AMBULATORY_CARE_PROVIDER_SITE_OTHER): Admitting: Internal Medicine

## 2023-07-31 VITALS — BP 110/60 | HR 73 | Temp 98.0°F | Ht <= 58 in | Wt 94.4 lb

## 2023-07-31 DIAGNOSIS — F32A Depression, unspecified: Secondary | ICD-10-CM

## 2023-07-31 DIAGNOSIS — Z0000 Encounter for general adult medical examination without abnormal findings: Secondary | ICD-10-CM | POA: Diagnosis not present

## 2023-07-31 DIAGNOSIS — I10 Essential (primary) hypertension: Secondary | ICD-10-CM | POA: Diagnosis not present

## 2023-07-31 DIAGNOSIS — Z79899 Other long term (current) drug therapy: Secondary | ICD-10-CM

## 2023-07-31 DIAGNOSIS — G4733 Obstructive sleep apnea (adult) (pediatric): Secondary | ICD-10-CM

## 2023-07-31 DIAGNOSIS — E538 Deficiency of other specified B group vitamins: Secondary | ICD-10-CM

## 2023-07-31 DIAGNOSIS — E7800 Pure hypercholesterolemia, unspecified: Secondary | ICD-10-CM | POA: Diagnosis not present

## 2023-07-31 DIAGNOSIS — E876 Hypokalemia: Secondary | ICD-10-CM | POA: Diagnosis not present

## 2023-07-31 DIAGNOSIS — R197 Diarrhea, unspecified: Secondary | ICD-10-CM

## 2023-07-31 DIAGNOSIS — G3184 Mild cognitive impairment, so stated: Secondary | ICD-10-CM

## 2023-07-31 LAB — CBC WITH DIFFERENTIAL/PLATELET
Basophils Absolute: 0.1 K/uL (ref 0.0–0.1)
Basophils Relative: 0.9 % (ref 0.0–3.0)
Eosinophils Absolute: 0.2 K/uL (ref 0.0–0.7)
Eosinophils Relative: 2.7 % (ref 0.0–5.0)
HCT: 37.8 % (ref 36.0–46.0)
Hemoglobin: 12.3 g/dL (ref 12.0–15.0)
Lymphocytes Relative: 21.7 % (ref 12.0–46.0)
Lymphs Abs: 1.5 K/uL (ref 0.7–4.0)
MCHC: 32.6 g/dL (ref 30.0–36.0)
MCV: 91.9 fl (ref 78.0–100.0)
Monocytes Absolute: 0.5 K/uL (ref 0.1–1.0)
Monocytes Relative: 6.7 % (ref 3.0–12.0)
Neutro Abs: 4.6 K/uL (ref 1.4–7.7)
Neutrophils Relative %: 68 % (ref 43.0–77.0)
Platelets: 218 K/uL (ref 150.0–400.0)
RBC: 4.11 Mil/uL (ref 3.87–5.11)
RDW: 14.3 % (ref 11.5–15.5)
WBC: 6.8 K/uL (ref 4.0–10.5)

## 2023-07-31 LAB — T4, FREE: Free T4: 0.78 ng/dL (ref 0.60–1.60)

## 2023-07-31 LAB — COMPREHENSIVE METABOLIC PANEL WITH GFR
ALT: 9 U/L (ref 0–35)
AST: 13 U/L (ref 0–37)
Albumin: 4.3 g/dL (ref 3.5–5.2)
Alkaline Phosphatase: 60 U/L (ref 39–117)
BUN: 20 mg/dL (ref 6–23)
CO2: 30 meq/L (ref 19–32)
Calcium: 9.5 mg/dL (ref 8.4–10.5)
Chloride: 105 meq/L (ref 96–112)
Creatinine, Ser: 0.8 mg/dL (ref 0.40–1.20)
GFR: 71.07 mL/min (ref >60.00)
Glucose, Bld: 77 mg/dL (ref 70–99)
Potassium: 3.8 meq/L (ref 3.5–5.1)
Sodium: 143 meq/L (ref 135–145)
Total Bilirubin: 0.5 mg/dL (ref 0.2–1.2)
Total Protein: 6.3 g/dL (ref 6.0–8.3)

## 2023-07-31 LAB — LIPID PANEL
Cholesterol: 174 mg/dL (ref 0–200)
HDL: 80 mg/dL (ref >39.00)
LDL Cholesterol: 73 mg/dL (ref 0–99)
NonHDL: 94.35
Total CHOL/HDL Ratio: 2
Triglycerides: 105 mg/dL (ref 0.0–149.0)
VLDL: 21 mg/dL (ref 0.0–40.0)

## 2023-07-31 LAB — VITAMIN D 25 HYDROXY (VIT D DEFICIENCY, FRACTURES): VITD: 38.59 ng/mL (ref 30.00–100.00)

## 2023-07-31 LAB — VITAMIN B12: Vitamin B-12: 1348 pg/mL — ABNORMAL HIGH (ref 211–911)

## 2023-07-31 LAB — MAGNESIUM: Magnesium: 2.1 mg/dL (ref 1.5–2.5)

## 2023-07-31 LAB — TSH: TSH: 2.04 u[IU]/mL (ref 0.35–5.50)

## 2023-07-31 NOTE — Patient Instructions (Addendum)
 Make sure take enough protein per day  to help bone health.   ( 1.3 grams /kg  per day)   Checking vit d and b12 .  Rei stance exercises  important for bone health.  Will   look at  depression meds  review consider  other options .  Counseling  is a good idea.   And optimization of bowel habits  interfering with activities .  Wt Readings from Last 3 Encounters:  07/31/23 94 lb 6.4 oz (42.8 kg)  05/08/23 98 lb (44.5 kg)  04/17/23 95 lb (43.1 kg)

## 2023-07-31 NOTE — Progress Notes (Unsigned)
 Chief Complaint  Patient presents with   Annual Exam    Pt is fasting    HPI: Patient  Caitlin [name]  78 y.o. comes in today for Preventive Health Care visit  w spouse today  Also  summary from Daughter in record messages   Completed update neuro cognitive  eval  slow mild demetia and cmultifactorial health  issues effecting  cognition mood .  Wellbutrin    Lexapro   no local psychiatry appt available but consider med optimization.  Gi habits are an issue .  Avoid going out cause of diarrhea  or urgency  and if takes immodium then gets constipated .   Limits  ability to social group  eating better   Not doing a lot of exercise but is now  ok without walker   le but still  not optimal  Potassium ok  Swallowing  better   late fu Nandigam  appts not available  Dexa pending NA until feb! Taking vit d and b12 alternating   Health Maintenance  Topic Date Due   Medicare Annual Wellness (AWV)  09/28/2023   COVID-19 Vaccine (7 - Pfizer risk 2024-25 season) 08/16/2023 (Originally 04/25/2023)   DTaP/Tdap/Td (2 - Td or Tdap) 07/30/2024 (Originally 10/26/2021)   INFLUENZA VACCINE  08/17/2023   MAMMOGRAM  11/21/2023   Pneumococcal Vaccine: 50+ Years  Completed   DEXA SCAN  Completed   Hepatitis C Screening  Completed   Zoster Vaccines- Shingrix  Completed   Hepatitis B Vaccines  Aged Out   HPV VACCINES  Aged Out   Meningococcal B Vaccine  Aged Out   Colonoscopy  Discontinued   Health Maintenance Review LIFESTYLE:  Exercise:   up and around Tobacco/ETS: n Alcohol: n Sugar beverages: diet coke .  Sleep: back on cpap.   New  system  1.5 hours  and forgets to turn back on.  Drug use: no HH of 2 no pets   ROS:  GEN/ HEENT: No fever, significant weight changes sweats headaches vision problems  x aiwth glasses hearing changes, CV/ PULM; No chest pain shortness of breath cough, syncope,edema  change in exercise tolerance. SKIN/HEME: ,no acute skin rashes suspicious lesions or bleeding.  No lymphadenopathy, nodules, masses. Bump scarch uner r breast  NEURO/ PSYCH:  No  new neurologic signs such as weakness numbness. IMM/ Allergy: No unusual infections.  Allergy .   REST of 12 system review negative except as per HPI   Past Medical History:  Diagnosis Date   Allergic rhinitis 07/11/2006   Back pain 10/09/2007   Blood transfusion without reported diagnosis    Diaphragmatic hernia 07/11/2006   Diverticulosis of colon    Eczema 07/11/2006   Elevated cholesterol 04/29/2012   Enteritis    Erythema nodosum    Tends to be seasonal has had a negative chest x-ray in the past negative PPD   Essential hypertension 07/11/2006   Fatigue 03/12/2007   Fibromyalgia    Gastroparesis 12/23/2008   Generalized anxiety disorder 09/01/2014   GERD (gastroesophageal reflux disease)    History of stricture and dilatation and Nissen surgery   Hearing loss, left ear 11/27/2008   History of cataract    bilateral; s/p surgery   Hyperglycemia 08/15/2006   Irritable bowel syndrome 12/23/2008   Leg cramps, nocturnal 11/11/2007   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   MCI (mild cognitive impairment) 10/22/2018   Obstructive sleep apnea 04/23/2007   uses CPAP nightly; mask has leak   Osteoarthritis 11/28/2011  Osteopenia    Prediabetes 04/19/2011   Vitamin B12 deficiency 05/13/2010    Past Surgical History:  Procedure Laterality Date   ABDOMINAL HYSTERECTOMY     bone spur     CATARACT EXTRACTION W/ INTRAOCULAR LENS  IMPLANT, BILATERAL     CESAREAN SECTION     x 1   CHOLECYSTECTOMY     COLONOSCOPY     DENTAL SURGERY     ESOPHAGOGASTRODUODENOSCOPY     stricture and dilatation   IR KYPHO EA ADDL LEVEL THORACIC OR LUMBAR  04/28/2022   IR KYPHO THORACIC WITH BONE BIOPSY  04/28/2022   IR RADIOLOGIST EVAL & MGMT  04/20/2022   IR RADIOLOGIST EVAL & MGMT  05/19/2022   left shoulder surgery  06/08, 07/12/17   x 2   MOUTH SURGERY Right 06/03/2019   NISSEN FUNDOPLICATION     ROTATOR CUFF  REPAIR Left    rt knee surgery     scope   UPPER GASTROINTESTINAL ENDOSCOPY      Family History  Problem Relation Age of Onset   Depression Mother    Kidney cancer Mother    Dementia Mother        Unspecified, likely secondary to other medical condition (cancer)   Depression Father    Hiatal hernia Father    COPD Sister    Dementia Sister        Unspecified, likely secondary to other medical condition   Dementia Brother    Heart Problems Brother    Nephrolithiasis Other    Colon cancer Neg Hx    Esophageal cancer Neg Hx    Pancreatic cancer Neg Hx    Rectal cancer Neg Hx    Stomach cancer Neg Hx     Social History   Socioeconomic History   Marital status: Married    Spouse name: Not on file   Number of children: 1   Years of education: 14   Highest education level: Associate degree: academic program  Occupational History   Occupation: Retired  Tobacco Use   Smoking status: Never   Smokeless tobacco: Never  Vaping Use   Vaping status: Never Used  Substance and Sexual Activity   Alcohol use: No    Alcohol/week: 0.0 standard drinks of alcohol   Drug use: No   Sexual activity: Not Currently  Other Topics Concern   Not on file  Social History Narrative   Unemployed   Married   HH of 2    Has grandchildren out of state   No pets   Sis. died 2011/11/25COPD   Ranch home   Right handed   Social Drivers of Health   Financial Resource Strain: Low Risk  (03/28/2023)   Overall Financial Resource Strain (CARDIA)    Difficulty of Paying Living Expenses: Not hard at all  Food Insecurity: No Food Insecurity (03/28/2023)   Hunger Vital Sign    Worried About Running Out of Food in the Last Year: Never true    Ran Out of Food in the Last Year: Never true  Transportation Needs: No Transportation Needs (03/28/2023)   PRAPARE - Administrator, Civil Service (Medical): No    Lack of Transportation (Non-Medical): No  Physical Activity: Inactive (03/28/2023)    Exercise Vital Sign    Days of Exercise per Week: 0 days    Minutes of Exercise per Session: 30 min  Stress: Stress Concern Present (03/28/2023)   [employer] of Occupational Health - Occupational Stress Questionnaire  Feeling of Stress : Rather much  Social Connections: Moderately Isolated (03/28/2023)   Social Connection and Isolation Panel    Frequency of Communication with Friends and Family: Once a week    Frequency of Social Gatherings with Friends and Family: Once a week    Attends Religious Services: 1 to 4 times per year    Active Member of [employer] or Organizations: No    Attends Banker Meetings: Never    Marital Status: Married    Outpatient Medications Prior to Visit  Medication Sig Dispense Refill   acetaminophen  (TYLENOL ) 325 MG tablet Take 2 tablets (650 mg total) by mouth every 6 (six) hours as needed for mild pain (or Fever >/= 101).     buPROPion  (WELLBUTRIN  XL) 150 MG 24 hr tablet Take 1 tablet (150 mg total) by mouth daily. 90 tablet 3   Cholecalciferol  (VITAMIN D3) 1000 units CAPS Take 1,000 Units by mouth daily.     Cyanocobalamin  (VITAMIN B-12) 1000 MCG SUBL Place 2,000 mcg under the tongue. Every other day     Docusate Calcium (STOOL SOFTENER PO) Take by mouth. Every other day     escitalopram  (LEXAPRO ) 10 MG tablet Take 1.5 tablets (15 mg total) by mouth daily. 135 tablet 3   FIBER PO Take by mouth daily. Benefiber powder     memantine  (NAMENDA ) 10 MG tablet Take 1 tablet (10 mg total) by mouth 2 (two) times daily. 60 tablet 11   Multiple Vitamins-Minerals (PRESERVISION AREDS 2) CAPS Take 1 capsule by mouth 2 (two) times a week.     pantoprazole  (PROTONIX ) 40 MG tablet Take 1 tablet by mouth once daily 90 tablet 0   potassium chloride  (KLOR-CON ) 10 MEQ tablet TAKE 1 TABLET BY MOUTH EVERY OTHER DAY 45 tablet 1   SIMPLY SALINE 0.9 % AERS Place 1 spray into both nostrils in the morning and at bedtime.     famotidine  (PEPCID ) 40 MG tablet Take 1  tablet by mouth once daily 90 tablet 0   No facility-administered medications prior to visit.     EXAM:  BP 110/60   Pulse 73   Temp 98 F (36.7 C)   Ht 4' 8 (1.422 m)   Wt 94 lb 6.4 oz (42.8 kg)   SpO2 99%   BMI 21.16 kg/m   Body mass index is 21.16 kg/m. Wt Readings from Last 3 Encounters:  07/31/23 94 lb 6.4 oz (42.8 kg)  05/08/23 98 lb (44.5 kg)  04/17/23 95 lb (43.1 kg)    Physical Exam: Vital signs reviewed HZW:Uypd is a well-developed frail but alert cooperative    who appearsr stated age in no acute distress.  HEENT: normocephalic atraumatic , Eyes: PERRL EOM's full, conjunctiva clear, Nares: paten,t no deformity discharge or tenderness., Ears: no deformity EAC's clear TMs with normal landmarks. Mouth: clear OP, no lesions, edema.  Moist mucous membranes. NECK: supple without masses, thyromegaly or bruits. CHEST/PULM:  Clear to auscultation and percussion breath sounds equal no wheeze , rales or rhonchi. No chest wall deformities or tenderness. Kyphosis  Breast: normal by inspection . No dimpling, discharge, masses, tenderness or discharge . CV: PMI is nondisplaced, S1 S2 no gallops, murmurs, rubs. Peripheral pulses are full without delay.No JVD .  ABDOMEN: Bowel sounds normal nontender  No guard or rebound, no hepato splenomegal no CVA tenderness.   Extremtities:  No clubbing cyanosis or edema, no acute joint swelling or redness no focal atrophy NEURO:  Oriented x3, cranial nerves 3-12  appear to be intact, no obvious focal weakness,gait within normal limits no abnormal reflexes or asymmetrical independent gait but slow  SKIN: normal turgor, color, no bruising or petechiae. PSYCH: Oriented, good eye contact,  no interaction today LN: no cervical axillary adenopathy  Lab Results  Component Value Date   WBC 7.3 03/28/2023   HGB 13.0 03/28/2023   HCT 40.1 03/28/2023   PLT 250.0 03/28/2023   GLUCOSE 89 03/28/2023   CHOL 182 07/25/2022   TRIG 109.0 07/25/2022   HDL  69.30 07/25/2022   LDLCALC 91 07/25/2022   ALT 8 07/25/2022   AST 13 07/25/2022   NA 142 03/28/2023   K 3.7 03/28/2023   CL 104 03/28/2023   CREATININE 0.75 03/28/2023   BUN 14 03/28/2023   CO2 31 03/28/2023   TSH 2.41 07/25/2022   HGBA1C 5.0 07/25/2022   MICROALBUR 1.1 08/17/2008    BP Readings from Last 3 Encounters:  07/31/23 110/60  05/08/23 136/78  04/17/23 (!) 150/87   Lab Results  Component Value Date   VITAMINB12 1,231 (H) 01/25/2022    Lab plan  reviewed with patient   ASSESSMENT AND PLAN:  Discussed the following assessment and plan:    ICD-10-CM   1. Visit for preventive health examination  Z00.00     2. MCI (mild cognitive impairment)  G31.84 Vitamin D , 25-hydroxy    Vitamin B12    TSH    T4, free    Comprehensive metabolic panel with GFR    CBC with Differential/Platelet    Magnesium     Lipid panel    3. Medication management  Z79.899 Vitamin D , 25-hydroxy    Vitamin B12    TSH    T4, free    Comprehensive metabolic panel with GFR    CBC with Differential/Platelet    Magnesium     Lipid panel    4. Essential hypertension  I10 Vitamin D , 25-hydroxy    Vitamin B12    TSH    T4, free    Comprehensive metabolic panel with GFR    CBC with Differential/Platelet    Magnesium     Lipid panel    5. Hypokalemia  E87.6 Vitamin D , 25-hydroxy    Vitamin B12    TSH    T4, free    Comprehensive metabolic panel with GFR    CBC with Differential/Platelet    Magnesium     Lipid panel    6. Obstructive sleep apnea  G47.33 Vitamin D , 25-hydroxy    Vitamin B12    TSH    T4, free    Comprehensive metabolic panel with GFR    CBC with Differential/Platelet    Magnesium     Lipid panel    7. Elevated cholesterol  E78.00 Vitamin D , 25-hydroxy    Vitamin B12    TSH    T4, free    Comprehensive metabolic panel with GFR    CBC with Differential/Platelet    Magnesium     Lipid panel    8. Depression, unspecified depression type  F32.A Vitamin D ,  25-hydroxy    Vitamin B12    TSH    T4, free    Comprehensive metabolic panel with GFR    CBC with Differential/Platelet    Magnesium     Lipid panel    9. Vitamin B 12 deficiency  E53.8 Vitamin D , 25-hydroxy    Vitamin B12    TSH    T4, free    Comprehensive metabolic panel with GFR    CBC  with Differential/Platelet    Magnesium     Lipid panel    10. Diarrhea in adult patient  R19.7      Seems like unpredictable bowel habits are in the way of her social interactions and getting out  which would help her depressive sx.  Uncertain if diet change other meds would help?   Will have to do record review about  depression meds that have been triedin past  Consider pharmacy outreach as to se of meds and other options without drug interactions and may be gene psych testing?  Would hope GI team could get some advice   eating is better after esophageal manipulation ut she is still underweight.  Bone health optimization also needs attention  dexa pending . ? Prolia other options but need dexa done t Fu ? 4-6 mos. Return for depending on results.  Patient Care Team: Harlo Jaso, Apolinar POUR, MD as PCP - Diedre Joshua Blamer, MD as Consulting Physician (Dermatology) Cleatus Collar, MD as Attending Physician (Ophthalmology) Court Pulling, MD as Consulting Physician (Dermatology) Georjean Darice HERO, MD as Consulting Physician (Neurology) Neda Jennet LABOR, MD as Consulting Physician (Pulmonary Disease) Wertman, Sara E, PA-C (Neurology) Nandigam, Kavitha V, MD as Consulting Physician (Gastroenterology) Patient Instructions  Make sure take enough protein per day  to help bone health.   ( 1.3 grams /kg  per day)   Checking vit d and b12 .  Rei stance exercises  important for bone health.  Will   look at  depression meds  review consider  other options .  Counseling  is a good idea.   And optimization of bowel habits  interfering with activities .  Wt Readings from Last 3 Encounters:  07/31/23  94 lb 6.4 oz (42.8 kg)  05/08/23 98 lb (44.5 kg)  04/17/23 95 lb (43.1 kg)       Julliana Whitmyer K. Shahan Starks M.D.

## 2023-08-01 ENCOUNTER — Telehealth: Payer: Self-pay | Admitting: *Deleted

## 2023-08-01 ENCOUNTER — Telehealth: Payer: Self-pay

## 2023-08-01 DIAGNOSIS — F332 Major depressive disorder, recurrent severe without psychotic features: Secondary | ICD-10-CM

## 2023-08-01 NOTE — Progress Notes (Signed)
   08/01/2023  Patient ID: Caitlin [name], female   DOB: Jun 14, 1945, 78 y.o.   MRN: [id number]  Received request from PCP to review patient's medications and assistance with managing depression, potential med side effects and to discuss genetic testing possibilities.  Placing referral so scheduling team can reach out to patient to coordinate a visit.  [name], PharmD Clinical Pharmacist [phone]

## 2023-08-01 NOTE — Progress Notes (Signed)
 Care Guide Pharmacy Note  08/01/2023 Name: Caitlin [name] MRN: [id number] DOB: 03-26-1945  Referred By: Charlett Apolinar POUR, MD Reason for referral: Complex Care Management (Initial outreach to schedule referral with PharmD)   [name] is a 78 y.o. year old female who is a primary care patient of Panosh, Wanda K, MD.  [name] was referred to the pharmacist for assistance related to: Severe episode of recurrent major depressive disorder, without psychotic features [facility])   Successful contact was made with the patient spouse DPR on file Mr Betten to discuss pharmacy services including being ready for the pharmacist to call at least 5 minutes before the scheduled appointment time and to have medication bottles and any blood pressure readings ready for review. The patient husband Mr Pulse agreed to meet with the pharmacist via telephone visit on (date/time).08/03/23 at 10:00 am   Harlene Satterfield  [facility], [facility] Guide  Direct Dial: [phone]  Fax [phone]

## 2023-08-03 ENCOUNTER — Other Ambulatory Visit

## 2023-08-03 ENCOUNTER — Other Ambulatory Visit (INDEPENDENT_AMBULATORY_CARE_PROVIDER_SITE_OTHER)

## 2023-08-03 DIAGNOSIS — Z79899 Other long term (current) drug therapy: Secondary | ICD-10-CM

## 2023-08-03 NOTE — Progress Notes (Signed)
 08/03/2023 Name: Caitlin [name] MRN: [id number] DOB: Mar 31, 1945  Chief Complaint  Patient presents with   Medication Management   Diabetes    Caitlin [name] is a 78 y.o. year old female who presented for a telephone visit.   They were referred to the pharmacist by their PCP for assistance in managing complex medication management.    Subjective:  Care Team: Primary Care Provider: Panosh, Wanda K, MD   Medication Access/Adherence  Current Pharmacy:  [facility] [address] [place] [address] [place] [address] Phone: [phone] Fax: [phone]   Patient reports affordability concerns with their medications: No  Patient reports access/transportation concerns to their pharmacy: No  Patient reports adherence concerns with their medications:  No     Depression/Anxiety:  Current medications: Escitalpram 15mg , Wellbutrin  XL 150mg  Medications tried in the past: Sertraline   Behavioral Health support: None   IBS: -Current Medications: Docusate 100mg  1 capsule daily, Fiber 1 capful daily, Gas-X as needed gas  -Has tried taking docusate every other day and decreasing the fiber supplement in past and this resulted in severe constipation per spouse report  -Wondering if any medications are contributing  Objective:  Lab Results  Component Value Date   HGBA1C 5.0 07/25/2022    Lab Results  Component Value Date   CREATININE 0.80 07/31/2023   BUN 20 07/31/2023   NA 143 07/31/2023   K 3.8 07/31/2023   CL 105 07/31/2023   CO2 30 07/31/2023    Lab Results  Component Value Date   CHOL 174 07/31/2023   HDL 80.00 07/31/2023   LDLCALC 73 07/31/2023   TRIG 105.0 07/31/2023   CHOLHDL 2 07/31/2023    Medications Reviewed Today     Reviewed by Lionell Jon DEL, RPH (Pharmacist) on 08/03/23 at 1436  Med List Status: <None>   Medication Order Taking? Sig Documenting Provider Last Dose Status Informant  acetaminophen   (TYLENOL ) 325 MG tablet [id number] Yes Take 2 tablets (650 mg total) by mouth every 6 (six) hours as needed for mild pain (or Fever >/= 101). Shalhoub, Zachary PARAS, MD  Active   buPROPion  (WELLBUTRIN  XL) 150 MG 24 hr tablet [id number] Yes Take 1 tablet (150 mg total) by mouth daily. Panosh, Apolinar POUR, MD  Active   Cholecalciferol  (VITAMIN D3) 1000 units CAPS [id number] Yes Take 1,000 Units by mouth daily. [provider]  Active Spouse/Significant Other  Cyanocobalamin  (VITAMIN B-12) 1000 MCG SUBL [id number] Yes Place 2,000 mcg under the tongue. Every other day [provider]  Active Spouse/Significant Other  Docusate Calcium (STOOL SOFTENER PO) [id number] Yes Take by mouth. Every other day [provider]  Active   escitalopram  (LEXAPRO ) 10 MG tablet [id number] Yes Take 1.5 tablets (15 mg total) by mouth daily. Panosh, Apolinar POUR, MD  Active   famotidine  (PEPCID ) 40 MG tablet [id number] Yes Take 1 tablet by mouth once daily Nandigam, Kavitha V, MD  Active   FIBER PO [id number] Yes Take by mouth daily. Benefiber powder [provider]  Active   memantine  (NAMENDA ) 10 MG tablet [id number] Yes Take 1 tablet (10 mg total) by mouth 2 (two) times daily. Wertman, Sara E, PA-C  Active   Multiple Vitamins-Minerals (PRESERVISION AREDS 2) CAPS [id number] Yes Take 1 capsule by mouth 2 (two) times a week. [provider]  Active Spouse/Significant Other  pantoprazole  (PROTONIX ) 40 MG tablet [id number] Yes Take 1 tablet by mouth once daily Nandigam, Kavitha V, MD  Active  potassium chloride  (KLOR-CON ) 10 MEQ tablet [id number] Yes TAKE 1 TABLET BY MOUTH EVERY OTHER DAY Panosh, Wanda K, MD  Active   SIMPLY SALINE 0.9 % AERS [id number] Yes Place 1 spray into both nostrils in the morning and at bedtime. [provider]  Active Spouse/Significant Other              Assessment/Plan:   Depression/Anxiety: - Currently uncontrolled - Recommend to refer to behavioral health as  patient is interested in talking with someone -Consider increase on lexapro  or wellbutrin , plan to discuss with PCP      IBS: -Keep follow up with GI -Unlikely any medications other than docusate or fiber are contributing to loose stools. Memantine  and PPI can upset stomach but patient spouse denies any worsened symptoms when either of these meds were initiated  -Counseled that could try cutting back docusate to every other day again but could worsen constipation concerns, prefer to keep as is right now and will discuss with GI    Follow Up Plan: 2 weeks  [name], PharmD Clinical Pharmacist [phone]

## 2023-08-04 ENCOUNTER — Other Ambulatory Visit: Payer: Self-pay | Admitting: Gastroenterology

## 2023-08-08 ENCOUNTER — Ambulatory Visit: Admitting: Pulmonary Disease

## 2023-08-08 VITALS — BP 126/74 | HR 70

## 2023-08-08 DIAGNOSIS — G4733 Obstructive sleep apnea (adult) (pediatric): Secondary | ICD-10-CM

## 2023-08-08 NOTE — Patient Instructions (Signed)
 DME referral for BiPAP changes  Changed from 12 /8 to -14/9  Let us  know if the pressure change is not tolerated  If it is tolerated then we can slowly walk away  Goal is to have her continuing to use BiPAP nightly  Follow-up in 3 months

## 2023-08-08 NOTE — Addendum Note (Signed)
 Addended by: KARNA CONSUELO PARAS on: 08/08/2023 04:17 PM   Modules accepted: Orders

## 2023-08-08 NOTE — Progress Notes (Signed)
 Caitlin [name]    [id number]    02/11/1945  Primary Care Physician:Panosh, Apolinar POUR, MD  Referring Physician: Charlett Apolinar POUR, MD [address] [place],  [place] [address]  Chief complaint:  History of obstructive sleep apnea on BiPAP therapy In for follow-up  HPI:  Has been doing relatively well  Still have issues with IBS  Has been using the BiPAP nightly  Concerned with the machine not working right but spouse stated that sometimes when she goes to the bathroom, she may not remember to cut it back on  Mask issues a little bit better with extra small mask  Most recent sleep study was in January 2025 continues to have multiple awakenings at night  History of fibromyalgia Still very tired during the day  Study did reveal obstructive sleep apnea titrated to BiPAP of 18/14, last pressure change was to 14/10 because of pressure intolerance  Known history of obstructive sleep apnea  Weight loss recently with other health problems  Outpatient Encounter Medications as of 08/08/2023  Medication Sig   acetaminophen  (TYLENOL ) 325 MG tablet Take 2 tablets (650 mg total) by mouth every 6 (six) hours as needed for mild pain (or Fever >/= 101).   buPROPion  (WELLBUTRIN  XL) 150 MG 24 hr tablet Take 1 tablet (150 mg total) by mouth daily.   Cholecalciferol  (VITAMIN D3) 1000 units CAPS Take 1,000 Units by mouth daily.   Cyanocobalamin  (VITAMIN B-12) 1000 MCG SUBL Place 2,000 mcg under the tongue. Every other day   Docusate Calcium (STOOL SOFTENER PO) Take by mouth. Every other day   escitalopram  (LEXAPRO ) 10 MG tablet Take 1.5 tablets (15 mg total) by mouth daily.   famotidine  (PEPCID ) 40 MG tablet Take 1 tablet by mouth once daily   FIBER PO Take by mouth daily. Benefiber powder   memantine  (NAMENDA ) 10 MG tablet Take 1 tablet (10 mg total) by mouth 2 (two) times daily.   Multiple Vitamins-Minerals (PRESERVISION AREDS 2) CAPS Take 1 capsule by mouth 2 (two) times a  week.   pantoprazole  (PROTONIX ) 40 MG tablet Take 1 tablet by mouth once daily   potassium chloride  (KLOR-CON ) 10 MEQ tablet TAKE 1 TABLET BY MOUTH EVERY OTHER DAY   simethicone (MYLICON) 80 MG chewable tablet Chew 80 mg by mouth every 6 (six) hours as needed for flatulence.   SIMPLY SALINE 0.9 % AERS Place 1 spray into both nostrils in the morning and at bedtime.   No facility-administered encounter medications on file as of 08/08/2023.    Allergies as of 08/08/2023 - Review Complete 08/08/2023  Allergen Reaction Noted   Codeine phosphate Other (See Comments) 03/15/2006   Mobic  [meloxicam ] Diarrhea 11/28/2011   Protonix  [pantoprazole  sodium] Diarrhea and Other (See Comments) 06/05/2012   Ibuprofen Diarrhea and Other (See Comments) 01/23/2022   Peppermint oil Other (See Comments) 01/23/2022   Aleve [naproxen] Nausea Only 05/15/2016   Colestipol  hcl  05/24/2017   Omeprazole-sodium bicarbonate Diarrhea and Other (See Comments) 12/23/2008   Sodium Other (See Comments) 11/16/2017   Sulfamethoxazole Diarrhea and Nausea And Vomiting 03/15/2006    Past Medical History:  Diagnosis Date   Allergic rhinitis 07/11/2006   Back pain 10/09/2007   Blood transfusion without reported diagnosis    Diaphragmatic hernia 07/11/2006   Diverticulosis of colon    Eczema 07/11/2006   Elevated cholesterol 04/29/2012   Enteritis    Erythema nodosum    Tends to be seasonal has had a negative  chest x-ray in the past negative PPD   Essential hypertension 07/11/2006   Fatigue 03/12/2007   Fibromyalgia    Gastroparesis 12/23/2008   Generalized anxiety disorder 09/01/2014   GERD (gastroesophageal reflux disease)    History of stricture and dilatation and Nissen surgery   Hearing loss, left ear 11/27/2008   History of cataract    bilateral; s/p surgery   Hyperglycemia 08/15/2006   Irritable bowel syndrome 12/23/2008   Leg cramps, nocturnal 11/11/2007   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   MCI (mild  cognitive impairment) 10/22/2018   Obstructive sleep apnea 04/23/2007   uses CPAP nightly; mask has leak   Osteoarthritis 11/28/2011   Osteopenia    Prediabetes 04/19/2011   Vitamin B12 deficiency 05/13/2010    Past Surgical History:  Procedure Laterality Date   ABDOMINAL HYSTERECTOMY     bone spur     CATARACT EXTRACTION W/ INTRAOCULAR LENS  IMPLANT, BILATERAL     CESAREAN SECTION     x 1   CHOLECYSTECTOMY     COLONOSCOPY     DENTAL SURGERY     ESOPHAGOGASTRODUODENOSCOPY     stricture and dilatation   IR KYPHO EA ADDL LEVEL THORACIC OR LUMBAR  04/28/2022   IR KYPHO THORACIC WITH BONE BIOPSY  04/28/2022   IR RADIOLOGIST EVAL & MGMT  04/20/2022   IR RADIOLOGIST EVAL & MGMT  05/19/2022   left shoulder surgery  06/08, 07/12/17   x 2   MOUTH SURGERY Right 06/03/2019   NISSEN FUNDOPLICATION     ROTATOR CUFF REPAIR Left    rt knee surgery     scope   UPPER GASTROINTESTINAL ENDOSCOPY      Family History  Problem Relation Age of Onset   Depression Mother    Kidney cancer Mother    Dementia Mother        Unspecified, likely secondary to other medical condition (cancer)   Depression Father    Hiatal hernia Father    COPD Sister    Dementia Sister        Unspecified, likely secondary to other medical condition   Dementia Brother    Heart Problems Brother    Nephrolithiasis Other    Colon cancer Neg Hx    Esophageal cancer Neg Hx    Pancreatic cancer Neg Hx    Rectal cancer Neg Hx    Stomach cancer Neg Hx     Social History   Socioeconomic History   Marital status: Married    Spouse name: Not on file   Number of children: 1   Years of education: 14   Highest education level: Associate degree: academic program  Occupational History   Occupation: Retired  Tobacco Use   Smoking status: Never   Smokeless tobacco: Never  Vaping Use   Vaping status: Never Used  Substance and Sexual Activity   Alcohol use: No    Alcohol/week: 0.0 standard drinks of alcohol   Drug use:  No   Sexual activity: Not Currently  Other Topics Concern   Not on file  Social History Narrative   Unemployed   Married   HH of 2    Has grandchildren out of state   No pets   Sis. died 12-18-2011COPD   Ranch home   Right handed   Social Drivers of Health   Financial Resource Strain: Low Risk  (03/28/2023)   Overall Financial Resource Strain (CARDIA)    Difficulty of Paying Living Expenses: Not hard at all  Food Insecurity: No Food Insecurity (03/28/2023)   Hunger Vital Sign    Worried About Running Out of Food in the Last Year: Never true    Ran Out of Food in the Last Year: Never true  Transportation Needs: No Transportation Needs (03/28/2023)   PRAPARE - Administrator, Civil Service (Medical): No    Lack of Transportation (Non-Medical): No  Physical Activity: Inactive (03/28/2023)   Exercise Vital Sign    Days of Exercise per Week: 0 days    Minutes of Exercise per Session: 30 min  Stress: Stress Concern Present (03/28/2023)   [employer] of Occupational Health - Occupational Stress Questionnaire    Feeling of Stress : Rather much  Social Connections: Moderately Isolated (03/28/2023)   Social Connection and Isolation Panel    Frequency of Communication with Friends and Family: Once a week    Frequency of Social Gatherings with Friends and Family: Once a week    Attends Religious Services: 1 to 4 times per year    Active Member of [employer] or Organizations: No    Attends Banker Meetings: Never    Marital Status: Married  Catering manager Violence: Not At Risk (01/23/2022)   Humiliation, Afraid, Rape, and Kick questionnaire    Fear of Current or Ex-Partner: No    Emotionally Abused: No    Physically Abused: No    Sexually Abused: No    Review of Systems  HENT:  Positive for drooling.   Eyes: Negative.   Respiratory:  Positive for apnea and shortness of breath.   Cardiovascular: Negative.   Gastrointestinal: Negative.   Endocrine:  Negative.   Musculoskeletal:  Positive for arthralgias and myalgias.  Psychiatric/Behavioral:  Positive for sleep disturbance.     Vitals:   08/08/23 1415  BP: 126/74  Pulse: 70  SpO2: 99%    Physical Exam Constitutional:      Appearance: Normal appearance. She is well-developed.  HENT:     Head: Normocephalic and atraumatic.     Mouth/Throat:     Mouth: Mucous membranes are moist.  Eyes:     General:        Right eye: No discharge.        Left eye: No discharge.  Neck:     Thyroid : No thyromegaly.  Cardiovascular:     Rate and Rhythm: Normal rate.  Pulmonary:     Effort: Pulmonary effort is normal. No respiratory distress.     Breath sounds: Normal breath sounds. No stridor. No wheezing or rhonchi.  Musculoskeletal:        General: Normal range of motion.     Cervical back: No rigidity or tenderness.  Neurological:     Mental Status: She is alert.  Psychiatric:        Mood and Affect: Mood normal.       03/15/2018    1:00 PM 02/13/2018   11:00 AM 09/25/2017    3:00 PM  Results of the Epworth flowsheet  Sitting and reading 0 0 0  Watching TV 3 3 1   Sitting, inactive in a public place (e.g. a theatre or a meeting) 0 0 0  As a passenger in a car for an hour without a break 0 0 0  Lying down to rest in the afternoon when circumstances permit 3 3 2   Sitting and talking to someone 0 0 0  Sitting quietly after a lunch without alcohol 0 0 0  In a car, while stopped for  a few minutes in traffic 0 0 0  Total score 6 6 3    Recent sleep study 02/11/2023 -Severe sleep apnea, very poor sleep efficiency, central sleep apnea  Compliance data shows 60% compliance, tries to use it every night the 60% compliance is because of data not being available in the earlier part of the month Does have some mask leaks Residual AHI of 33.7  Assessment:  Pressure changes over time has been 18/14 down to 14/10 now down to 12/8 Number of AHI's is much higher  Multiple  awakenings  Overall functions better during the day  Weight is stable  Plan/Recommendations:  Change BiPAP settings from 12/8  to -14/9  Continue same masks  Make sure you are getting enough hours of sleep  Goal is not to change pressures too aggressively in order to improve AHI but causing her to be able to tolerate the machine   Jennet Epley MD [facility] 08/08/2023, 2:20 PM  CC: Panosh, Wanda K, MD

## 2023-08-13 ENCOUNTER — Other Ambulatory Visit

## 2023-08-15 ENCOUNTER — Other Ambulatory Visit

## 2023-08-15 ENCOUNTER — Telehealth: Payer: Self-pay

## 2023-08-15 DIAGNOSIS — Z79899 Other long term (current) drug therapy: Secondary | ICD-10-CM

## 2023-08-15 NOTE — Progress Notes (Signed)
   08/15/2023  Patient ID: Caitlin [name], female   DOB: [birth year], 78 y.o.   MRN: [id number]  Attempted to contact patient for scheduled appointment for medication management. Left HIPAA compliant message for patient to return my call at their convenience.   [name], PharmD Clinical Pharmacist [phone]

## 2023-08-15 NOTE — Progress Notes (Signed)
 08/15/2023 Name: Caitlin [name] MRN: [id number] DOB: 12-Feb-1945  Chief Complaint  Patient presents with   Medication Management    Caitlin [name] is a 78 y.o. year old female who presented for a telephone visit.   They were referred to the pharmacist by their PCP for assistance in managing complex medication management.    Subjective:  Care Team: Primary Care Provider: Panosh, Wanda K, MD   Medication Access/Adherence  Current Pharmacy:  [facility] [address], [place] - [address] [place]  [address] Phone: [phone] Fax: [phone]   Patient reports affordability concerns with their medications: No  Patient reports access/transportation concerns to their pharmacy: No  Patient reports adherence concerns with their medications:  No     Depression/Anxiety:  Current medications: Escitalpram 15mg , Wellbutrin  XL 150mg  Medications tried in the past: Sertraline   Behavioral Health support: None    Objective:  Lab Results  Component Value Date   HGBA1C 5.0 07/25/2022    Lab Results  Component Value Date   CREATININE 0.80 07/31/2023   BUN 20 07/31/2023   NA 143 07/31/2023   [name] 3.8 07/31/2023   CL 105 07/31/2023   CO2 30 07/31/2023    Lab Results  Component Value Date   CHOL 174 07/31/2023   HDL 80.00 07/31/2023   LDLCALC 73 07/31/2023   TRIG 105.0 07/31/2023   CHOLHDL 2 07/31/2023    Medications Reviewed Today     Reviewed by Caitlin [name], RPH (Pharmacist) on 08/15/23 at 1633  Med List Status: <None>   Medication Order Taking? Sig Documenting Provider Last Dose Status Informant  acetaminophen  (TYLENOL ) 325 MG tablet [id number]  Take 2 tablets (650 mg total) by mouth every 6 (six) hours as needed for mild pain (or Fever >/= 101). Shalhoub, Zachary PARAS, MD  Active   buPROPion  (WELLBUTRIN  XL) 150 MG 24 hr tablet [id number]  Take 1 tablet (150 mg total) by mouth daily. Panosh, Apolinar POUR, MD  Active    Cholecalciferol  (VITAMIN D3) 1000 units CAPS [id number]  Take 1,000 Units by mouth daily. [provider]  Active Spouse/Significant Other  Cyanocobalamin  (VITAMIN B-12) 1000 MCG SUBL [id number]  Place 2,000 mcg under the tongue. Every other day [provider]  Active Spouse/Significant Other  Docusate Calcium (STOOL SOFTENER PO) [id number]  Take by mouth. Every other day [provider]  Active   escitalopram  (LEXAPRO ) 10 MG tablet [id number]  Take 1.5 tablets (15 mg total) by mouth daily. Panosh, Wanda K, MD  Active   famotidine  (PEPCID ) 40 MG tablet [id number]  Take 1 tablet by mouth once daily Nandigam, Kavitha V, MD  Active   FIBER PO [id number]  Take by mouth daily. Benefiber powder [provider]  Active   memantine  (NAMENDA ) 10 MG tablet [id number]  Take 1 tablet (10 mg total) by mouth 2 (two) times daily. Wertman, Sara E, PA-C  Active   Multiple Vitamins-Minerals (PRESERVISION AREDS 2) CAPS [id number]  Take 1 capsule by mouth 2 (two) times a week. [provider]  Active Spouse/Significant Other  pantoprazole  (PROTONIX ) 40 MG tablet [id number]  Take 1 tablet by mouth once daily Nandigam, Kavitha V, MD  Active   potassium chloride  (KLOR-CON ) 10 MEQ tablet [id number]  TAKE 1 TABLET BY MOUTH EVERY OTHER DAY Panosh, Wanda K, MD  Active   simethicone (MYLICON) 80 MG chewable tablet [id number]  Chew 80 mg by mouth every 6 (six) hours as needed for flatulence. [provider]  Active  SIMPLY SALINE 0.9 % AERS [id number]  Place 1 spray into both nostrils in the morning and at bedtime. [provider]  Active Spouse/Significant Other              Assessment/Plan:   Depression/Anxiety: - Currently uncontrolled - Recommend to refer to behavioral health as patient is interested in talking with someone, they will continue to explore their best options and let us  know if they decide they want a referral placed. -INCREASE lexapro  to 10mg  2  tabs daily for dose of 20mg .  -Want genetic testing done, appt scheduled to conduct swab and review what the report will look like         Follow Up Plan: 2 weeks  Caitlin [name], PharmD Clinical Pharmacist [phone]

## 2023-08-16 NOTE — Telephone Encounter (Signed)
 Was noted at lasts visit

## 2023-08-20 ENCOUNTER — Ambulatory Visit: Payer: Self-pay | Admitting: Internal Medicine

## 2023-08-20 DIAGNOSIS — E538 Deficiency of other specified B group vitamins: Secondary | ICD-10-CM

## 2023-08-20 NOTE — Progress Notes (Signed)
 Results are good except too high vit b12    would stop the b12 for now since eating better    plan fu b12 level in 4 month or when next blood test planned  [facility] to take a miultivit that has b12 in it  in interim

## 2023-08-24 ENCOUNTER — Telehealth: Payer: Self-pay

## 2023-08-24 DIAGNOSIS — G4733 Obstructive sleep apnea (adult) (pediatric): Secondary | ICD-10-CM

## 2023-08-24 NOTE — Telephone Encounter (Signed)
 Copied from CRM [id number]. Topic: Clinical - Order For Equipment >> Aug 23, 2023  9:10 AM Nathanel DEL wrote: Reason for CRM: pt's husband , Elsie calling to advise the pt's Cpap was increased and she just cannot handle the high pressure.  Needs someone to reset, she prefers to go back to original setting. [phone]   Called and spoke with the patients husband per DPR. Husband states cpap pressure was changed from 12/8 to 14/9 at [facility]. Pt is very overwhelmed with pressure, and husband says she cannot handle it. It is causing pt to be unable to sleep due to being so strong. Dr. Neda please advise

## 2023-08-28 NOTE — Telephone Encounter (Signed)
 Okay to send order to DME for pressure change back to previous pressure of 12/8

## 2023-08-29 ENCOUNTER — Ambulatory Visit

## 2023-08-29 DIAGNOSIS — Z79899 Other long term (current) drug therapy: Secondary | ICD-10-CM

## 2023-08-29 NOTE — Progress Notes (Signed)
   08/29/2023  Patient ID: [name], female   DOB: Aug 08, 1945, 78 y.o.   MRN: [id number]  Patient presented in office for [employer] test swab. Reviewed how the panel testing works and how the results can potentially be used to optimize her medication management.  Patient swab performed and awaiting UPS pick up for processing.  Follow Up: 2 weeks  [name], PharmD Clinical Pharmacist [phone]

## 2023-08-29 NOTE — Telephone Encounter (Signed)
 Called spoke with patient husband per dpr and informed of change told to communicate on how that works.NFN

## 2023-09-11 ENCOUNTER — Ambulatory Visit: Admitting: Physician Assistant

## 2023-09-11 ENCOUNTER — Encounter: Payer: Self-pay | Admitting: Physician Assistant

## 2023-09-11 VITALS — BP 122/60 | HR 72 | Ht <= 58 in | Wt 95.0 lb

## 2023-09-11 DIAGNOSIS — K580 Irritable bowel syndrome with diarrhea: Secondary | ICD-10-CM | POA: Diagnosis not present

## 2023-09-11 DIAGNOSIS — R1310 Dysphagia, unspecified: Secondary | ICD-10-CM | POA: Diagnosis not present

## 2023-09-11 DIAGNOSIS — K582 Mixed irritable bowel syndrome: Secondary | ICD-10-CM

## 2023-09-11 DIAGNOSIS — K2100 Gastro-esophageal reflux disease with esophagitis, without bleeding: Secondary | ICD-10-CM | POA: Diagnosis not present

## 2023-09-11 NOTE — Progress Notes (Signed)
 Chief Complaint: Follow up Dysphagia and IBS-D  HPI:    [name] is a 78 year old Caucasian female with a past medical history of fibromyalgia, sleep apnea on BiPAP, chronic reflux, recurrent hiatal hernia with slipped Nissen and IBS predominant diarrhea, known to Dr. Shila, who was referred to me by Panosh, Wanda K, MD for follow up of dysphagia and IBS-D.    09/04/2022 office visit with Dr. Nandigam at that time following up for GERD and dysphagia.  Discussed some constipation.  At that time discussed GERD with recurrent hiatal hernia and slipped Nissen, symptoms had improved and were stable on Pantoprazole  40 mg every morning and Pepcid  20 mg nightly.  Recommend an EGD for further eval of dysphagia.  Discussed using fiber for IBS-D.  Recommend Dicyclomine  10 mg 3 times daily as needed.  Discussed FDgard twice daily as needed for dyspepsia.    09/05/2022 EGD with Z-line regular, dilation in the lower third of the esophagus, 5 cm hiatal hernia, Nissen fundoplication with intact wrap but is dilated.    07/31/2023 CBC and CMP normal.  TSH normal.  Vitamin D  normal.  B12 elevated.    Today, the patient presents to clinic accompanied by her husband and they explained that she was supposed to follow-up in January after her EGD, but apparently the appointment was never made.  They thought she was told to discontinue Dicyclomine  at time of her EGD, so she has not used that at all.  Chief complaint today is that her IBS is erratic.  There are certain things that she avoids eating because they will cause issues such as fresh fruits and high fibers, but otherwise that she just will not eat if they know they need to go somewhere.  Otherwise she occasionally uses a half of Imodium if she does need to leave the house, but then this will constipate her for 3 days.  Previously was using Dicyclomine  as needed and that was helping with symptoms.  Apparently tried FD guard at 1 point but is allergic to peppermint.   Occasionally does have abdominal cramping.    Does describe today that swallowing issues are gone since recent EGD with dilation.  Continues on Pantoprazole  40 mg daily and Famotidine  nightly.    Denies fever, chills or further weight loss.  GI Hx:  Barium Swallow 06-14-22 1. Moderately narrowed appearance of the distal esophagus concerning for possible stricture. Endoscopy should be considered for further evaluation. 2. Moderate esophageal dysmotility. 3. Small to moderate-sized hiatal hernia. 4. Small curvilinear focus of contrast projecting rightward (above the diaphragm) at the level of the hiatal hernia. This likely reflects contrast tracking within a gastric fold which previously contributed to the Nissen wrap. 5. The patient was unable to swallow a 13 mm barium tablet despite multiple attempts.   DG Swallow Function 06-14-22 1. Modified barium swallow as described. 2. Laryngeal penetration was observed with the patient drinking thin liquid barium. No frank aspiration was observed. 3. Please refer to the speech pathologist's report for complete details and recommendations.   Colonoscopy 10/04/2016: Diverticulosis. Internal hemorrhoids. recall colonoscopy 10 years   EGD 10/04/2016: 6cm Hiatal hernia, Slipped Nissen  Past Medical History:  Diagnosis Date   Allergic rhinitis 07/11/2006   Back pain 10/09/2007   Blood transfusion without reported diagnosis    Diaphragmatic hernia 07/11/2006   Diverticulosis of colon    Eczema 07/11/2006   Elevated cholesterol 04/29/2012   Enteritis    Erythema nodosum    Tends to be  seasonal has had a negative chest x-ray in the past negative PPD   Essential hypertension 07/11/2006   Fatigue 03/12/2007   Fibromyalgia    Gastroparesis 12/23/2008   Generalized anxiety disorder 09/01/2014   GERD (gastroesophageal reflux disease)    History of stricture and dilatation and Nissen surgery   Hearing loss, left ear 11/27/2008   History of  cataract    bilateral; s/p surgery   Hyperglycemia 08/15/2006   Irritable bowel syndrome 12/23/2008   Leg cramps, nocturnal 11/11/2007   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   MCI (mild cognitive impairment) 10/22/2018   Obstructive sleep apnea 04/23/2007   uses CPAP nightly; mask has leak   Osteoarthritis 11/28/2011   Osteopenia    Prediabetes 04/19/2011   Vitamin B12 deficiency 05/13/2010    Past Surgical History:  Procedure Laterality Date   ABDOMINAL HYSTERECTOMY     bone spur     CATARACT EXTRACTION W/ INTRAOCULAR LENS  IMPLANT, BILATERAL     CESAREAN SECTION     x 1   CHOLECYSTECTOMY     COLONOSCOPY     DENTAL SURGERY     ESOPHAGOGASTRODUODENOSCOPY     stricture and dilatation   IR KYPHO EA ADDL LEVEL THORACIC OR LUMBAR  04/28/2022   IR KYPHO THORACIC WITH BONE BIOPSY  04/28/2022   IR RADIOLOGIST EVAL & MGMT  04/20/2022   IR RADIOLOGIST EVAL & MGMT  05/19/2022   left shoulder surgery  06/08, 07/12/17   x 2   MOUTH SURGERY Right 06/03/2019   NISSEN FUNDOPLICATION     ROTATOR CUFF REPAIR Left    rt knee surgery     scope   UPPER GASTROINTESTINAL ENDOSCOPY      Current Outpatient Medications  Medication Sig Dispense Refill   acetaminophen  (TYLENOL ) 325 MG tablet Take 2 tablets (650 mg total) by mouth every 6 (six) hours as needed for mild pain (or Fever >/= 101).     buPROPion  (WELLBUTRIN  XL) 150 MG 24 hr tablet Take 1 tablet (150 mg total) by mouth daily. 90 tablet 3   Cholecalciferol  (VITAMIN D3) 1000 units CAPS Take 1,000 Units by mouth daily.     Cyanocobalamin  (VITAMIN B-12) 1000 MCG SUBL Place 2,000 mcg under the tongue. Every other day     Docusate Calcium (STOOL SOFTENER PO) Take by mouth. Every other day     escitalopram  (LEXAPRO ) 10 MG tablet Take 1.5 tablets (15 mg total) by mouth daily. 135 tablet 3   famotidine  (PEPCID ) 40 MG tablet Take 1 tablet by mouth once daily 90 tablet 0   FIBER PO Take by mouth daily. Benefiber powder     memantine  (NAMENDA ) 10 MG  tablet Take 1 tablet (10 mg total) by mouth 2 (two) times daily. 60 tablet 11   Multiple Vitamins-Minerals (PRESERVISION AREDS 2) CAPS Take 1 capsule by mouth 2 (two) times a week.     pantoprazole  (PROTONIX ) 40 MG tablet Take 1 tablet by mouth once daily 90 tablet 0   potassium chloride  (KLOR-CON ) 10 MEQ tablet TAKE 1 TABLET BY MOUTH EVERY OTHER DAY 45 tablet 1   simethicone (MYLICON) 80 MG chewable tablet Chew 80 mg by mouth every 6 (six) hours as needed for flatulence.     SIMPLY SALINE 0.9 % AERS Place 1 spray into both nostrils in the morning and at bedtime.     No current facility-administered medications for this visit.    Allergies as of 09/11/2023 - Review Complete 08/15/2023  Allergen Reaction Noted  Codeine phosphate Other (See Comments) 03/15/2006   Mobic  [meloxicam ] Diarrhea 11/28/2011   Protonix  [pantoprazole  sodium] Diarrhea and Other (See Comments) 06/05/2012   Ibuprofen Diarrhea and Other (See Comments) 01/23/2022   Peppermint oil Other (See Comments) 01/23/2022   Aleve [naproxen] Nausea Only 05/15/2016   Colestipol  hcl  05/24/2017   Omeprazole-sodium bicarbonate Diarrhea and Other (See Comments) 12/23/2008   Sodium Other (See Comments) 2017-11-22   Sulfamethoxazole Diarrhea and Nausea And Vomiting 03/15/2006    Family History  Problem Relation Age of Onset   Depression Mother    Kidney cancer Mother    Dementia Mother        Unspecified, likely secondary to other medical condition (cancer)   Depression Father    Hiatal hernia Father    COPD Sister    Dementia Sister        Unspecified, likely secondary to other medical condition   Dementia Brother    Heart Problems Brother    Nephrolithiasis Other    Colon cancer Neg Hx    Esophageal cancer Neg Hx    Pancreatic cancer Neg Hx    Rectal cancer Neg Hx    Stomach cancer Neg Hx     Social History   Socioeconomic History   Marital status: Married    Spouse name: Not on file   Number of children: 1    Years of education: 14   Highest education level: Associate degree: academic program  Occupational History   Occupation: Retired  Tobacco Use   Smoking status: Never   Smokeless tobacco: Never  Vaping Use   Vaping status: Never Used  Substance and Sexual Activity   Alcohol use: No    Alcohol/week: 0.0 standard drinks of alcohol   Drug use: No   Sexual activity: Not Currently  Other Topics Concern   Not on file  Social History Narrative   Unemployed   Married   HH of 2    Has grandchildren out of state   No pets   Sis. died 11-07-11COPD   Ranch home   Right handed   Social Drivers of Health   Financial Resource Strain: Low Risk  (03/28/2023)   Overall Financial Resource Strain (CARDIA)    Difficulty of Paying Living Expenses: Not hard at all  Food Insecurity: No Food Insecurity (03/28/2023)   Hunger Vital Sign    Worried About Running Out of Food in the Last Year: Never true    Ran Out of Food in the Last Year: Never true  Transportation Needs: No Transportation Needs (03/28/2023)   PRAPARE - Administrator, Civil Service (Medical): No    Lack of Transportation (Non-Medical): No  Physical Activity: Inactive (03/28/2023)   Exercise Vital Sign    Days of Exercise per Week: 0 days    Minutes of Exercise per Session: 30 min  Stress: Stress Concern Present (03/28/2023)   [employer] of Occupational Health - Occupational Stress Questionnaire    Feeling of Stress : Rather much  Social Connections: Moderately Isolated (03/28/2023)   Social Connection and Isolation Panel    Frequency of Communication with Friends and Family: Once a week    Frequency of Social Gatherings with Friends and Family: Once a week    Attends Religious Services: 1 to 4 times per year    Active Member of [employer] or Organizations: No    Attends Banker Meetings: Never    Marital Status: Married  Catering manager Violence: Not At  Risk (01/23/2022)   Humiliation, Afraid, Rape,  and Kick questionnaire    Fear of Current or Ex-Partner: No    Emotionally Abused: No    Physically Abused: No    Sexually Abused: No    Review of Systems:    Constitutional: No weight loss, fever or chills Cardiovascular: No chest pain  Respiratory: No SOB  Gastrointestinal: See HPI and otherwise negative   Physical Exam:  Vital signs: BP 122/60 (BP Location: Left Arm, Patient Position: Sitting, Cuff Size: Normal)   Pulse 72   Ht 4' 8.5 (1.435 m) Comment: height measured without shoes  Wt 95 lb (43.1 kg)   BMI 20.92 kg/m    Constitutional:   Pleasant elderly Caucasian female appears to be in NAD, Well developed, Well nourished, alert and cooperative Respiratory: Respirations even and unlabored. Lungs clear to auscultation bilaterally.   No wheezes, crackles, or rhonchi.  Cardiovascular: Normal S1, S2. No MRG. Regular rate and rhythm. No peripheral edema, cyanosis or pallor.  Gastrointestinal:  Soft, nondistended, nontender. No rebound or guarding. Normal bowel sounds. No appreciable masses or hepatomegaly. Rectal:  Not performed.  Psychiatric: Oriented to person, place and time. Demonstrates good judgement and reason without abnormal affect or behaviors.  MOST RECENT LABS AND IMAGING: CBC    Component Value Date/Time   WBC 6.8 07/31/2023 1050   RBC 4.11 07/31/2023 1050   HGB 12.3 07/31/2023 1050   HCT 37.8 07/31/2023 1050   PLT 218.0 07/31/2023 1050   MCV 91.9 07/31/2023 1050   MCH 29.7 01/27/2022 0334   MCHC 32.6 07/31/2023 1050   RDW 14.3 07/31/2023 1050   LYMPHSABS 1.5 07/31/2023 1050   MONOABS 0.5 07/31/2023 1050   EOSABS 0.2 07/31/2023 1050   BASOSABS 0.1 07/31/2023 1050    CMP     Component Value Date/Time   NA 143 07/31/2023 1050   K 3.8 07/31/2023 1050   CL 105 07/31/2023 1050   CO2 30 07/31/2023 1050   GLUCOSE 77 07/31/2023 1050   BUN 20 07/31/2023 1050   CREATININE 0.80 07/31/2023 1050   CALCIUM 9.5 07/31/2023 1050   CALCIUM 10.4 05/13/2010 1225    PROT 6.3 07/31/2023 1050   ALBUMIN 4.3 07/31/2023 1050   AST 13 07/31/2023 1050   ALT 9 07/31/2023 1050   ALKPHOS 60 07/31/2023 1050   BILITOT 0.5 07/31/2023 1050   GFRNONAA >60 01/27/2022 0334   GFRAA 56 (L) 04/28/2016 1832    Assessment: 1.  IBS-D: Erratic for the patient, though she does know what foods will cause trouble, Imodium will stop her up for 3 days, if she does not eat she is fine, occasional abdominal cramping, previously on Dicyclomine  which they thought they were supposed to stop after recent EGD so they have not been using it 2.  Dysphagia: EGD in August of last year with dilation, since then patient has had no further trouble swallowing 3.  GERD: Chronic for the patient controlled on Pantoprazole  40 daily and Famotidine  40 daily  Plan: 1.  Continue Dicyclomine  3 times daily as needed.  They can call for refills if needed. 2.  Continue Pantoprazole  40 mg daily and Pepcid  40 mg nightly 3.  Reviewed recent EGD findings.  Did discuss that if she has further dysphagia symptoms in the future could consider repeat since it was beneficial for her. 4.  Discussed IBS diagnosis in general and having a toolbox of things she can use to help her during times of diarrhea or when she has constipation  from Imodium including fiber supplement which she is on, not Dicyclomine , Imodium, MiraLAX 5.  Patient to follow in clinic with us  as needed.  Delon Failing, PA-C [facility] 09/11/2023, 2:59 PM  Cc: Panosh, Wanda K, MD

## 2023-09-12 ENCOUNTER — Other Ambulatory Visit

## 2023-09-12 DIAGNOSIS — Z79899 Other long term (current) drug therapy: Secondary | ICD-10-CM

## 2023-09-12 MED ORDER — ESCITALOPRAM OXALATE 20 MG PO TABS: 20.0000 mg | ORAL_TABLET | Freq: Every day | ORAL | 1 refills | Status: AC

## 2023-09-12 NOTE — Progress Notes (Signed)
   09/12/2023 Name: Caitlin [name] MRN: [id number] DOB: January 23, 1945  Chief Complaint  Patient presents with   Medication Management    Caitlin [name] is a 78 y.o. year old female who presented for a telephone visit.   They were referred to the pharmacist by their PCP for assistance in managing complex medication management.    Subjective:  Care Team: Primary Care Provider: Panosh, Wanda K, MD , Next Scheduled Visit: 01/22/24  Medication Access/Adherence  Current Pharmacy:  [facility] [address] [place] - [address] [place] [address] Phone: [phone] Fax: [phone]   Patient reports affordability concerns with their medications: No  Patient reports access/transportation concerns to their pharmacy: No  Patient reports adherence concerns with their medications:  No     Depression/Anxiety:  Current medications: Escitalpram 20mg , Wellbutrin  XL 150mg  Medications tried in the past: Sertraline   Behavioral Health support: None  Patient and husband feel the lexapro  dose increase to 20mg  (up from 15mg ) is going well and helping with anxiety. Getting low on 10mg  tablets and requesting a new rx for the new dose  [employer] panel is still pending insurance.    Objective:  Lab Results  Component Value Date   HGBA1C 5.0 07/25/2022    Lab Results  Component Value Date   CREATININE 0.80 07/31/2023   BUN 20 07/31/2023   NA 143 07/31/2023   K 3.8 07/31/2023   CL 105 07/31/2023   CO2 30 07/31/2023    Lab Results  Component Value Date   CHOL 174 07/31/2023   HDL 80.00 07/31/2023   LDLCALC 73 07/31/2023   TRIG 105.0 07/31/2023   CHOLHDL 2 07/31/2023    Medications Reviewed Today   Medications were not reviewed in this encounter       Assessment/Plan:   Depression/Anxiety: - Currently controlled - Recommend to refer to behavioral health as patient is interested in talking with someone, they will continue to  explore their best options and let us  know if they decide they want a referral placed. -Continue lexapro  20mg  1 tablet daily, sending in new order -Decline to schedule follow up at this moment, will hold on scheduling until [facility] panel results.    Follow Up Plan: pending GeneMarkers testing results  [name], PharmD Clinical Pharmacist [phone]

## 2023-09-18 ENCOUNTER — Other Ambulatory Visit: Payer: Self-pay | Admitting: Physician Assistant

## 2023-09-24 ENCOUNTER — Telehealth: Payer: Self-pay

## 2023-09-24 NOTE — Progress Notes (Signed)
   09/24/2023  Patient ID: Caitlin [name], female   DOB: 1945/09/03, 78 y.o.   MRN: [id number]  Contacted patient to notify that [facility] Panel has resulted and report is ready. Scheduled an office visit for 9/15 to review.  [name], PharmD Clinical Pharmacist [phone]

## 2023-09-25 ENCOUNTER — Institutional Professional Consult (permissible substitution): Payer: Medicare Other | Admitting: Psychology

## 2023-09-25 ENCOUNTER — Ambulatory Visit: Payer: Self-pay

## 2023-10-01 ENCOUNTER — Ambulatory Visit (INDEPENDENT_AMBULATORY_CARE_PROVIDER_SITE_OTHER)

## 2023-10-01 DIAGNOSIS — Z79899 Other long term (current) drug therapy: Secondary | ICD-10-CM

## 2023-10-01 NOTE — Progress Notes (Signed)
   10/01/2023 Name: Caitlin [name] MRN: [id number] DOB: 15-Nov-1945  Chief Complaint  Patient presents with   Medication Management    GeneMarkers Results Review    Caitlin [name] is a 78 y.o. year old female who presented for a telephone visit.   They were referred to the pharmacist by their PCP for assistance in managing complex medication management.    Subjective:  Care Team: Primary Care Provider: Panosh, Wanda K, MD , Next Scheduled Visit: 01/22/24  Medication Access/Adherence  Current Pharmacy:  [facility] [address] [place] - [address] [place] [address] Phone: [phone] Fax: [phone]   Patient reports affordability concerns with their medications: No  Patient reports access/transportation concerns to their pharmacy: No  Patient reports adherence concerns with their medications:  No     Depression/Anxiety:  Current medications: Escitalpram 20mg , Wellbutrin  XL 150mg  Medications tried in the past: Sertraline   Behavioral Health support: None  Patient and husband feel the lexapro  dose increase to 20mg  (up from 15mg ) is going well and helping with anxiety.   [employer] panel has resulted.     Objective:  Lab Results  Component Value Date   HGBA1C 5.0 07/25/2022    Lab Results  Component Value Date   CREATININE 0.80 07/31/2023   BUN 20 07/31/2023   NA 143 07/31/2023   K 3.8 07/31/2023   CL 105 07/31/2023   CO2 30 07/31/2023    Lab Results  Component Value Date   CHOL 174 07/31/2023   HDL 80.00 07/31/2023   LDLCALC 73 07/31/2023   TRIG 105.0 07/31/2023   CHOLHDL 2 07/31/2023    Medications Reviewed Today   Medications were not reviewed in this encounter       Assessment/Plan:   Depression/Anxiety: - Currently controlled but wonder if room for improvement by patient -Reviewed GeneMarker result panel. Patient is an ultra-rapid metabolizer of lexapro , meaning that she may need higher  doses of lexapro  or an alternative to treatment. Patient is a poor metabolizer of bupropion , meaning patient does not convert bupropion  to its active metabolite and may respond better to higher doses or alternative treatments. -Patient prefers to stay on current therapy for 1 more month and will consider changes at that time if still not feeling mood is as controlled as desired.  Important GeneMarkers Results: -Patient has a Factor V Leiden variant that puts her at moderately increased clot risk. Would want to carefully weigh risk/benefit of starting any medication that can increase clot risk. Also would want to consider prophylaxis blood thinner around surgeries/procedures that can increase clot risk. -Ultra rapid metabolizer of pantoprazole /omeprazole/lansoprazole. May respond better to nexium  or aciphex. They plan to try OTC and discuss follow up with GI.  Full gene panel summary below:  Full report being uploaded into media file of patient's chart. Patient given a copy as well at today's visit.  Follow Up: 1 month  [name], PharmD Clinical Pharmacist [phone]

## 2023-10-04 ENCOUNTER — Encounter: Payer: Medicare Other | Admitting: Psychology

## 2023-10-08 ENCOUNTER — Telehealth: Payer: Self-pay | Admitting: Physician Assistant

## 2023-10-08 MED ORDER — ESOMEPRAZOLE MAGNESIUM 40 MG PO CPDR: 40.0000 mg | DELAYED_RELEASE_CAPSULE | Freq: Every day | ORAL | 6 refills | Status: DC

## 2023-10-08 NOTE — Telephone Encounter (Signed)
 Rx for Nexium  sent to pharmacy. Pt's spouse notified. Verbalized understanding.

## 2023-10-08 NOTE — Telephone Encounter (Signed)
 Spoke with pt's spouse. He reports that pt's PCP ordered a Gene Markers test. The results are present in Epic. Per results:   -Ultra rapid metabolizer of pantoprazole /omeprazole/lansoprazole. May respond better to nexium  or aciphex. They plan to try OTC and discuss follow up with GI.   Spouse would like to know if pt's medication can be switched to try 1 of these 2 medications.

## 2023-10-08 NOTE — Telephone Encounter (Signed)
 Inbound call from patient spouse wanting to switch patient prescription over to Nexium . Please advise.   Thank you

## 2023-10-10 ENCOUNTER — Encounter: Payer: Self-pay | Admitting: Internal Medicine

## 2023-10-20 ENCOUNTER — Other Ambulatory Visit: Payer: Self-pay | Admitting: Physician Assistant

## 2023-10-22 ENCOUNTER — Ambulatory Visit: Admitting: Physician Assistant

## 2023-10-22 ENCOUNTER — Encounter: Payer: Self-pay | Admitting: Physician Assistant

## 2023-10-22 VITALS — BP 143/68 | HR 103 | Resp 18 | Ht <= 58 in

## 2023-10-22 DIAGNOSIS — F03A3 Unspecified dementia, mild, with mood disturbance: Secondary | ICD-10-CM | POA: Diagnosis not present

## 2023-10-22 MED ORDER — MEMANTINE HCL 10 MG PO TABS: 10.0000 mg | ORAL_TABLET | Freq: Two times a day (BID) | ORAL | 11 refills | Status: AC

## 2023-10-22 NOTE — Progress Notes (Signed)
 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 Patient is currently on memantine  10 mg twice daily.  Patient is able to participate on ADLs, no longer drives.  Mood is better controlled    Follow up in  6 months. Continue memantine  10 mg twice daily, side effects discussed Follow-up with pulmonary for OSA, continue BiPAP.    Recommend good control of her cardiovascular risk factors Continue to control mood as per PCP     Subjective:    This patient is accompanied in the office by her husband who supplements the history.  Previous records as well as any outside records available were reviewed prior to todays visit. Patient was last seen on 04/17/2023     Any changes in memory since last visit?  Since we got the CPAP under control the memory improved, daughter sees same positive changes.She has some difficulty with recent conversations and names of people repeats oneself?  Endorsed Disoriented when walking into a room? Denies    Leaving objects?  May misplace things but not in unusual places   Wandering behavior?  denies   Any personality changes since last visit?  Denies.   Any worsening depression?:  Denies.   Hallucinations or paranoia?  Denies.   Seizures? denies    Any sleep changes? Sleeps better. Denies vivid dreams, REM behavior or sleepwalking   Sleep apnea?   Denies.   Any hygiene concerns? Denies. May have to be reminded. Independent of bathing and dressing?  Endorsed  Does the patient needs help  with medications?  Husband is in charge   Who is in charge of the finances?Husband is in charge     Any changes in appetite?  Denies. Pretty good maintaining a lot better.      Patient have trouble swallowing? Denies.   Does the patient cook? No Any headaches?   denies   Any vision changes?  Chronic back pain  denies   Ambulates with difficulty?  In the house she was without a walker, but outside she uses it for stability.  Recent falls or head injuries? Denies.     Unilateral weakness, numbness or tingling? denies   Any tremors?  Denies    Any anosmia?  Denies   Any incontinence of urine?  Endorsed, wears diapers   Any bowel dysfunction?  Has IBS constipation    Patient lives with her husband  Does the patient drive? 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         Initial visit 07/28/22  How long did patient have memory difficulties?  After falling on Dec 1, she went downhill quickly-husband says.  She had anesthesia for ventral hernia and her husband feels that it has contributed to symptoms.  Patient has difficulty remembering recent conversations and people names or new information . She cannot change channels or use the phone, numbers are difficult to  her . She needs more help with her ADLS.  repeats oneself? Endorsed, she has always been quiet,  but asks questions more often, especially with appts.Today she told himI thought we were going to [facility] as she forgot  that she was coming to this appointment. Disoriented when walking into a room?  At night she thinks is daytime. Denies except occasionally not remembering what patient came to the room for    Leaving objects in unusual places? May misplace some things. She collects napkins.    Wandering  behavior? denies   Any personality changes ? Denies. Obsessed with wetness.  there is something wet in the ceiling, does not like water Any history of depression?: denies   Hallucinations or paranoia? Over the last couple of weeks she has been seeing deceased relatives. Husband says that she has done so for a long time. Seizures? denies    Any sleep changes?  Does not sleep well, uses a sleeping pill. Denies  vivid dreams, REM behavior or sleepwalking   Sleep apnea?  Has not been using the CPAP Any hygiene concerns?  Uses the walker for the shower Independent of bathing and dressing? She starts getting dress and she does not remember how to complete the tasks, he has to help her otherwise she may stay with the pants half down. She chooses colors well, however.  Does the patient need help with medications? Husband  is in charge   Who is in charge of the finances? Husband  is in charge     Any changes in appetite?  Lost weight and now they are working on my appetite. She has a GERD procedure ( ?dilatation) in August.    Patient have trouble swallowing she has esophageal narrowing which may cause some trouble with digestion. Does the patient cook?  No  Any headaches?  denies   Chronic back pain? Endorsed  Ambulates with difficulty? Needs a walker to ambulate, due to deconditioning. Doing PT. She has chronic cramps in the legs  Recent falls or head injuries? After a fall she hit the hip without fracture but has pain in the area. She needs a walker to ambulate Vision changes? R eye floaters. Sees an eye doctor  Stroke like symptoms?  denies   Any tremors?  denies   Any anosmia?  denies   Any incontinence of urine? Endorsed, wears pads . She is afraid of getting wet , especially since the surgery.  Any bowel dysfunction? She has IBS.    Patient lives  with her husband  History of heavy alcohol intake? denies   History of heavy tobacco use? denies   Family history of dementia?  Sister has  dementia, mother, unknown type.      Does patient drive? No longer drives  Neuropsych evaluation June 2025, [name]. Briefly, results indicated diffuse cognitive deficits, with the most prominent impairments observed in executive functioning. Compared to prior evaluations in 2020 and 2022, scores trended downward in several domains, particularly executive functioning, processing speed, and attention/working memory. Functional independence has also declined, due to a combination of physical and cognitive factors. 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 As noted in prior evaluations, the etiology of her deficits is likely multifactorial, including significant psychiatric distress, chronic pain, longstanding insomnia, and moderate cerebrovascular pathology. Fall followed by hospitalization in early 2024 may have contributed to the decline through secondary effects--such as physical deconditioning and worsening psychiatric and sleep symptoms--rather than reflecting progression of a  neurodegenerative disorder. At this time, the cognitive profile is not suggestive of an underlying Alzheimer's disease process, though this cannot be definitively ruled out.        History on Initial Assessment 09/01/2014: This is a 78 yo RH woman with a history of hypertension, diet-controlled hyperlipidemia, IBS, GERD, chronic neck and back pain, depression and anxiety, who presented for worsening memory. She reports that memory changes started several years ago, she would be unable to think of a name or word she wanted to say. She has had to write everything down otherwise she would forget appointments. She recalls the first time this happened was 12 years ago, she got a reminder call about a dentist appointment 2 days prior but still missed it. She is has been stressed and worried because 2 of her husband's family members have recently passed away with dementia. She continues to drive  without getting lost. She denies missing her regular medications but occasionally forgets her vitamins. She has to stay by the stove when cooking, one time she walked away from soup she was making and forgot about it. Her husband is in charge of bill payments. She has always had problems multi-tasking, but has noticed this has been worse the past couple of years, she easily loses her train of thought when distracted. Her maternal grandmother and 2 brothers were diagnosed with dementia. Her mother and sister had memory problems before they passed away from other medical conditions. She denies any significant head injuries, no alcohol use.    Diagnostic Data: MRI brain in 2012 was personally reviewed today, unremarkable with mild diffuse atrophy. Similar to MRI brain done 08/2014 as above.  PREVIOUS MEDICATIONS:   CURRENT MEDICATIONS:  Outpatient Encounter Medications as of 10/22/2023  Medication Sig   acetaminophen  (TYLENOL ) 325 MG tablet Take 2 tablets (650 mg total) by mouth every 6 (six) hours as needed for mild pain (or Fever >/= 101).   buPROPion  (WELLBUTRIN  XL) 150 MG 24 hr tablet Take 1 tablet (150 mg total) by mouth daily.   Cholecalciferol  (VITAMIN D3) 1000 units CAPS Take 1,000 Units by mouth daily.   Cyanocobalamin  (VITAMIN B-12) 1000 MCG SUBL Place 2,000 mcg under the tongue. Every other day   Docusate Calcium (STOOL SOFTENER PO) Take by mouth. Every other day   escitalopram  (LEXAPRO ) 20 MG tablet Take 1 tablet (20 mg total) by mouth daily.   esomeprazole  (NEXIUM ) 40 MG capsule Take 1 capsule (40 mg total) by mouth daily before breakfast.   famotidine  (PEPCID ) 40 MG tablet Take 1 tablet by mouth once daily   FIBER PO Take by mouth daily. Benefiber powder   Multiple Vitamins-Minerals (PRESERVISION AREDS 2) CAPS Take 1 capsule by mouth 2 (two) times a week.   potassium chloride  (KLOR-CON ) 10 MEQ tablet TAKE 1 TABLET BY MOUTH EVERY OTHER DAY   simethicone (MYLICON) 80 MG chewable tablet  Chew 80 mg by mouth every 6 (six) hours as needed for flatulence.   SIMPLY SALINE 0.9 % AERS Place 1 spray into both nostrils in the morning and at bedtime.   [DISCONTINUED] memantine  (NAMENDA ) 10 MG tablet Take 1 tablet by mouth twice daily   memantine  (NAMENDA ) 10 MG tablet Take 1 tablet (10 mg total) by mouth 2 (two) times daily.   [DISCONTINUED] memantine  (NAMENDA ) 10 MG tablet Take 1 tablet by mouth twice daily   No facility-administered encounter medications on file as of 10/22/2023.       07/28/2022    3:00 PM 04/04/2020  6:00 PM 03/12/2018    3:00 PM  MMSE - Mini Mental State Exam  Orientation to time 1 5 5   Orientation to Place 5 5 5   Registration 3 3 3   Attention/ Calculation 0 3 3  Recall 0 3 3  Language- name 2 objects 1 2 2   Language- repeat 1 1 1   Language- follow 3 step command 3 3 3   Language- read & follow direction 1 1 1   Write a sentence 0 1 1  Copy design 0 1 1  Total score 15 28 28        No data to display          Objective:     PHYSICAL EXAMINATION:    VITALS:   Vitals:   10/22/23 1433  BP: (!) 143/68  Pulse: (!) 103  Resp: 18  SpO2: 99%  Height: 4' 8.5 (1.435 m)    GEN:  The patient appears stated age and is in NAD. HEENT:  Normocephalic, atraumatic.   Neurological examination:  General: NAD, well-groomed, appears stated age. Orientation: The patient is alert. Oriented to person, place and not to date Cranial nerves: There is good facial symmetry.The speech is fluent and clear. No aphasia or dysarthria. Fund of knowledge is appropriate. Recent and remote memory are impaired. Attention and concentration are reduced. Able to name objects and repeat phrases.  Hearing is intact to conversational tone.   Sensation: Sensation is intact to light touch throughout Motor: Strength is at least antigravity x4. DTR's 2/4 in UE/LE     Movement examination: Tone: There is normal tone in the UE/LE Abnormal movements:  no tremor.  No myoclonus.   No asterixis.   Coordination:  There is no decremation with RAM's. Normal finger to nose  Gait and Station: The patient has some difficulty arising out of a deep-seated chair without the use of the hands. The patient's stride length is shorter.  Gait is cautious and narrow.    Thank you for allowing us  the opportunity to participate in the care of this nice patient. Please do not hesitate to contact us  for any questions or concerns.   Total time spent on today's visit was 24 minutes dedicated to this patient today, preparing to see patient, examining the patient, ordering tests and/or medications and counseling the patient, documenting clinical information in the EHR or other health record, independently interpreting results and communicating results to the patient/family, discussing treatment and goals, answering patient's questions and coordinating care.  Cc:  Panosh, Apolinar POUR, MD  Caitlin [name] 10/22/2023 5:25 PM

## 2023-10-22 NOTE — Patient Instructions (Addendum)
 It was a pleasure to see you today at our office.   Recommendations:    Increase Memantine10 mg tablets at night Follow up in  6 months    [email or web address]   RECOMMENDATIONS FOR ALL PATIENTS WITH MEMORY PROBLEMS: 1. Continue to exercise (Recommend 30 minutes of walking everyday, or 3 hours every week) 2. Increase social interactions - continue going to [place] and enjoy social gatherings with friends and family 3. Eat healthy, avoid fried foods and eat more fruits and vegetables 4. Maintain adequate blood pressure, blood sugar, and blood cholesterol level. Reducing the risk of stroke and cardiovascular disease also helps promoting better memory. 5. Avoid stressful situations. Live a simple life and avoid aggravations. Organize your time and prepare for the next day in anticipation. 6. Sleep well, avoid any interruptions of sleep and avoid any distractions in the bedroom that may interfere with adequate sleep quality 7. Avoid sugar, avoid sweets as there is a strong link between excessive sugar intake, diabetes, and cognitive impairment We discussed the Mediterranean diet, which has been shown to help patients reduce the risk of progressive memory disorders and reduces cardiovascular risk. This includes eating fish, eat fruits and green leafy vegetables, nuts like almonds and hazelnuts, walnuts, and also use olive oil. Avoid fast foods and fried foods as much as possible. Avoid sweets and sugar as sugar use has been linked to worsening of memory function.  There is always a concern of gradual progression of memory problems. If this is the case, then we may need to adjust level of care according to patient needs. Support, both to the patient and caregiver, should then be put into place.       DRIVING: Regarding driving, in patients with progressive memory problems, driving will be impaired. We advise to have someone else do the  driving if trouble finding directions or if minor accidents are reported. Independent driving assessment is available to determine safety of driving.   If you are interested in the driving assessment, you can contact the following:  [facility] [employer] [facility] in [place] [phone]  [facility] [phone]  [facility] [phone]  [facility] [phone] or [phone]   FALL PRECAUTIONS: Be cautious when walking. Scan the area for obstacles that may increase the risk of trips and falls. When getting up in the mornings, sit up at the edge of the bed for a few minutes before getting out of bed. Consider elevating the bed at the head end to avoid drop of blood pressure when getting up. Walk always in a well-lit room (use night lights in the walls). Avoid area rugs or power cords from appliances in the middle of the walkways. Use a walker or a cane if necessary and consider physical therapy for balance exercise. Get your eyesight checked regularly.  FINANCIAL OVERSIGHT: Supervision, especially oversight when making financial decisions or transactions is also recommended.  HOME SAFETY: Consider the safety of the kitchen when operating appliances like stoves, microwave oven, and blender. Consider having supervision and share cooking responsibilities until no longer able to participate in those. Accidents with firearms and other hazards in the house should be identified and addressed as well.   ABILITY TO BE LEFT ALONE: If patient is unable to contact 911 operator, consider using LifeLine, or when the need is there, arrange for someone to stay with patients. Smoking is a fire hazard, consider supervision or cessation. Risk of wandering should be assessed by caregiver and if detected at any point, supervision  and safe proof recommendations should be instituted.  MEDICATION SUPERVISION: Inability to self-administer medication needs to be constantly addressed.  Implement a mechanism to ensure safe administration of the medications.      Mediterranean Diet A Mediterranean diet refers to food and lifestyle choices that are based on the traditions of countries located on the [employer] [place]. This way of eating has been shown to help prevent certain conditions and improve outcomes for people who have chronic diseases, like kidney disease and heart disease. What are tips for following this plan? Lifestyle  Cook and eat meals together with your family, when possible. Drink enough fluid to keep your urine clear or pale yellow. Be physically active every day. This includes: Aerobic exercise like running or swimming. Leisure activities like gardening, walking, or housework. Get 7-8 hours of sleep each night. If recommended by your health care provider, drink red wine in moderation. This means 1 glass a day for nonpregnant women and 2 glasses a day for men. A glass of wine equals 5 oz (150 mL). Reading food labels  Check the serving size of packaged foods. For foods such as rice and pasta, the serving size refers to the amount of cooked product, not dry. Check the total fat in packaged foods. Avoid foods that have saturated fat or trans fats. Check the ingredients list for added sugars, such as corn syrup. Shopping  At the grocery store, buy most of your food from the areas near the walls of the store. This includes: Fresh fruits and vegetables (produce). Grains, beans, nuts, and seeds. Some of these may be available in unpackaged forms or large amounts (in bulk). Fresh seafood. Poultry and eggs. Low-fat dairy products. Buy whole ingredients instead of prepackaged foods. Buy fresh fruits and vegetables in-season from local farmers markets. Buy frozen fruits and vegetables in resealable bags. If you do not have access to quality fresh seafood, buy precooked frozen shrimp or canned fish, such as tuna, salmon, or sardines. Buy small amounts of raw or  cooked vegetables, salads, or olives from the deli or salad bar at your store. Stock your pantry so you always have certain foods on hand, such as olive oil, canned tuna, canned tomatoes, rice, pasta, and beans. Cooking  Cook foods with extra-virgin olive oil instead of using butter or other vegetable oils. Have meat as a side dish, and have vegetables or grains as your main dish. This means having meat in small portions or adding small amounts of meat to foods like pasta or stew. Use beans or vegetables instead of meat in common dishes like chili or lasagna. Experiment with different cooking methods. Try roasting or broiling vegetables instead of steaming or sauteing them. Add frozen vegetables to soups, stews, pasta, or rice. Add nuts or seeds for added healthy fat at each meal. You can add these to yogurt, salads, or vegetable dishes. Marinate fish or vegetables using olive oil, lemon juice, garlic, and fresh herbs. Meal planning  Plan to eat 1 vegetarian meal one day each week. Try to work up to 2 vegetarian meals, if possible. Eat seafood 2 or more times a week. Have healthy snacks readily available, such as: Vegetable sticks with hummus. Greek yogurt. Fruit and nut trail mix. Eat balanced meals throughout the week. This includes: Fruit: 2-3 servings a day Vegetables: 4-5 servings a day Low-fat dairy: 2 servings a day Fish, poultry, or lean meat: 1 serving a day Beans and legumes: 2 or more servings a week Nuts and seeds:  1-2 servings a day Whole grains: 6-8 servings a day Extra-virgin olive oil: 3-4 servings a day Limit red meat and sweets to only a few servings a month What are my food choices? Mediterranean diet Recommended Grains: Whole-grain pasta. Brown rice. Bulgar wheat. Polenta. Couscous. Whole-wheat bread. [place]. Vegetables: Artichokes. Beets. Broccoli. Cabbage. Carrots. Eggplant. Green beans. Chard. Kale. Spinach. Onions. Leeks. Peas. Squash. Tomatoes.  Peppers. Radishes. Fruits: Apples. Apricots. Avocado. Berries. Bananas. Cherries. Dates. Figs. Grapes. Lemons. Melon. Oranges. Peaches. Plums. Pomegranate. Meats and other protein foods: Beans. Almonds. Sunflower seeds. Pine nuts. Peanuts. Cod. Salmon. Scallops. Shrimp. Tuna. Tilapia. Clams. Oysters. Eggs. Dairy: Low-fat milk. Cheese. Greek yogurt. Beverages: Water. Red wine. Herbal tea. Fats and oils: Extra virgin olive oil. Avocado oil. Grape seed oil. Sweets and desserts: [place] yogurt with honey. Baked apples. Poached pears. Trail mix. Seasoning and other foods: Basil. Cilantro. Coriander. Cumin. Mint. Parsley. Sage. Rosemary. Tarragon. Garlic. Oregano. Thyme. Pepper. Balsalmic vinegar. Tahini. Hummus. Tomato sauce. Olives. Mushrooms. Limit these Grains: Prepackaged pasta or rice dishes. Prepackaged cereal with added sugar. Vegetables: Deep fried potatoes (french fries). Fruits: Fruit canned in syrup. Meats and other protein foods: Beef. Pork. Lamb. Poultry with skin. Hot dogs. [place]. Dairy: Ice cream. Sour cream. Whole milk. Beverages: Juice. Sugar-sweetened soft drinks. Beer. Liquor and spirits. Fats and oils: Butter. Canola oil. Vegetable oil. Beef fat (tallow). Lard. Sweets and desserts: Cookies. Cakes. Pies. Candy. Seasoning and other foods: Mayonnaise. Premade sauces and marinades. The items listed may not be a complete list. Talk with your dietitian about what dietary choices are right for you. Summary The Mediterranean diet includes both food and lifestyle choices. Eat a variety of fresh fruits and vegetables, beans, nuts, seeds, and whole grains. Limit the amount of red meat and sweets that you eat. Talk with your health care provider about whether it is safe for you to drink red wine in moderation. This means 1 glass a day for nonpregnant women and 2 glasses a day for men. A glass of wine equals 5 oz (150 mL). This information is not intended to replace advice given to you by  your health care provider. Make sure you discuss any questions you have with your health care provider. Document Released: 08/26/2015 Document Revised: 09/28/2015 Document Reviewed: 08/26/2015 Elsevier Interactive Patient Education  2017 [employer].

## 2023-10-23 ENCOUNTER — Other Ambulatory Visit: Payer: Self-pay | Admitting: Physician Assistant

## 2023-10-24 ENCOUNTER — Other Ambulatory Visit: Payer: Self-pay | Admitting: Internal Medicine

## 2023-10-24 DIAGNOSIS — Z1231 Encounter for screening mammogram for malignant neoplasm of breast: Secondary | ICD-10-CM

## 2023-10-26 ENCOUNTER — Other Ambulatory Visit: Payer: Self-pay | Admitting: Internal Medicine

## 2023-10-26 ENCOUNTER — Other Ambulatory Visit: Payer: Self-pay | Admitting: Gastroenterology

## 2023-10-31 ENCOUNTER — Encounter: Payer: Self-pay | Admitting: Internal Medicine

## 2023-10-31 ENCOUNTER — Other Ambulatory Visit: Payer: Self-pay | Admitting: Gastroenterology

## 2023-11-07 ENCOUNTER — Other Ambulatory Visit

## 2023-11-07 DIAGNOSIS — Z79899 Other long term (current) drug therapy: Secondary | ICD-10-CM

## 2023-11-07 NOTE — Progress Notes (Signed)
 11/07/2023 Name: Caitlin [name] MRN: [id number] DOB: [birth year]  Chief Complaint  Patient presents with   Medication Management    Caitlin [name] is a 78 y.o. year old female who presented for a telephone visit.   They were referred to the pharmacist by their PCP for assistance in managing complex medication management.    Subjective:  Care Team: Primary Care Provider: Panosh, Wanda K, MD , Next Scheduled Visit: 01/22/24  Medication Access/Adherence  Current Pharmacy:  [facility] [address] [place] - [address] [place] [address] Phone: [phone] Fax: [phone]   Patient reports affordability concerns with their medications: No  Patient reports access/transportation concerns to their pharmacy: No  Patient reports adherence concerns with their medications:  No     Depression/Anxiety:  Current medications: Escitalpram 20mg , Wellbutrin  XL 150mg  Medications tried in the past: Sertraline   Behavioral Health support: None  Patient and husband feel the lexapro  dose increase to 20mg  (up from 15mg ) is going well and helping with anxiety.   Urinary/Fatigue/Sleep Concerns: -Report frequent nighttime and daytime urination. State that she wakes up at least two times a night to urinate and has a very hard time going back to sleep after. Feels this is contributing to fatigue. -Reports this has been going on very a long time, many years -Tries to cut off caffeine by 4pm to help with sleep -Still having a hard time getting energy to leave the house and do things, wants to go back to sleep within 30 min of waking  OTHER: Mention that a nurse with their [place] insurance came to the house to evaluate patient and thought she might have heard a heart murmur, they have documentation from the visit at home. Want to discuss with PCP   Objective:  Lab Results  Component Value Date   HGBA1C 5.0 07/25/2022    Lab Results  Component  Value Date   CREATININE 0.80 07/31/2023   BUN 20 07/31/2023   NA 143 07/31/2023   K 3.8 07/31/2023   CL 105 07/31/2023   CO2 30 07/31/2023    Lab Results  Component Value Date   CHOL 174 07/31/2023   HDL 80.00 07/31/2023   LDLCALC 73 07/31/2023   TRIG 105.0 07/31/2023   CHOLHDL 2 07/31/2023    Medications Reviewed Today     Reviewed by Lionell Jon DEL, RPH (Pharmacist) on 11/07/23 at 1518  Med List Status: <None>   Medication Order Taking? Sig Documenting Provider Last Dose Status Informant  acetaminophen  (TYLENOL ) 325 MG tablet [id number]  Take 2 tablets (650 mg total) by mouth every 6 (six) hours as needed for mild pain (or Fever >/= 101). Shalhoub, Zachary PARAS, MD  Active   buPROPion  (WELLBUTRIN  XL) 150 MG 24 hr tablet [id number]  Take 1 tablet (150 mg total) by mouth daily. Panosh, Apolinar POUR, MD  Active   Cholecalciferol  (VITAMIN D3) 1000 units CAPS [id number]  Take 1,000 Units by mouth daily. [provider]  Active Spouse/Significant Other  Cyanocobalamin  (VITAMIN B-12) 1000 MCG SUBL [id number]  Place 2,000 mcg under the tongue. Every other day [provider]  Active Spouse/Significant Other  dicyclomine  (BENTYL ) 10 MG capsule [id number]  TAKE 1 CAPSULE BY MOUTH EVERY 8 HOURS AS NEEDED FOR  SPASMS Collier, Amanda R, PA-C  Active   Docusate Calcium (STOOL SOFTENER PO) [id number]  Take by mouth. Every other day [provider]  Active   escitalopram  (LEXAPRO ) 20 MG tablet [id number]  Take 1  tablet (20 mg total) by mouth daily. Panosh, Wanda K, MD  Active   famotidine  (PEPCID ) 40 MG tablet [id number]  Take 1 tablet by mouth once daily Nandigam, Kavitha V, MD  Active   FIBER PO [id number]  Take by mouth daily. Benefiber powder [provider]  Active   memantine  (NAMENDA ) 10 MG tablet [id number]  Take 1 tablet (10 mg total) by mouth 2 (two) times daily. Wertman, Sara E, PA-C  Active   Multiple Vitamins-Minerals (PRESERVISION AREDS 2) CAPS [id number]  Take 1  capsule by mouth 2 (two) times a week. [provider]  Active Spouse/Significant Other  pantoprazole  (PROTONIX ) 40 MG tablet [id number]  Take 1 tablet by mouth once daily Nandigam, Kavitha V, MD  Active   potassium chloride  (KLOR-CON ) 10 MEQ tablet [id number]  TAKE 1 TABLET BY MOUTH EVERY OTHER DAY Panosh, Wanda K, MD  Active   simethicone (MYLICON) 80 MG chewable tablet [id number]  Chew 80 mg by mouth every 6 (six) hours as needed for flatulence. [provider]  Active   SIMPLY SALINE 0.9 % AERS [id number]  Place 1 spray into both nostrils in the morning and at bedtime. [provider]  Active Spouse/Significant Other              Assessment/Plan:   Depression/Anxiety: - Currently controlled but wonder if room for improvement by patient -Reviewed GeneMarker result panel. Patient is an ultra-rapid metabolizer of lexapro , meaning that she may need higher doses of lexapro  or an alternative to treatment. Patient is a poor metabolizer of bupropion , meaning patient does not convert bupropion  to its active metabolite and may respond better to higher doses or alternative treatments. -Patient feels mood is stable right now, continue current med therapy  Urinary/Fatigue/Heart Murmur Concerns -Scheduled follow up with PCP. Instructed to bring nursing visit report that mentions heart murmur as I do not show this in chart at this time -Provided tips for fully emptying the bladder while trying to urinate -Recommend discussing with PCP for any potential diagnosis and medication treatment. If diagnosed with overactive bladder, could consider oxybutynin as she metabolizes that normally or a newer agent such as Gemtasa or Myrbetriq  Follow Up: 1 month  [name], PharmD Clinical Pharmacist [phone]

## 2023-11-08 ENCOUNTER — Ambulatory Visit: Admitting: Internal Medicine

## 2023-11-08 ENCOUNTER — Encounter: Payer: Self-pay | Admitting: Internal Medicine

## 2023-11-08 ENCOUNTER — Ambulatory Visit: Admitting: Pulmonary Disease

## 2023-11-08 VITALS — BP 142/72 | HR 64 | Temp 98.1°F | Ht <= 58 in | Wt 95.0 lb

## 2023-11-08 DIAGNOSIS — G4733 Obstructive sleep apnea (adult) (pediatric): Secondary | ICD-10-CM | POA: Diagnosis not present

## 2023-11-08 DIAGNOSIS — R011 Cardiac murmur, unspecified: Secondary | ICD-10-CM

## 2023-11-08 DIAGNOSIS — R350 Frequency of micturition: Secondary | ICD-10-CM

## 2023-11-08 DIAGNOSIS — R636 Underweight: Secondary | ICD-10-CM

## 2023-11-08 DIAGNOSIS — F3341 Major depressive disorder, recurrent, in partial remission: Secondary | ICD-10-CM

## 2023-11-08 LAB — POC URINALSYSI DIPSTICK (AUTOMATED)
Bilirubin, UA: NEGATIVE
Blood, UA: NEGATIVE
Glucose, UA: NEGATIVE
Ketones, UA: POSITIVE
Nitrite, UA: NEGATIVE
Protein, UA: POSITIVE — AB
Spec Grav, UA: >=1.03 — AB (ref 1.010–1.025)
Urobilinogen, UA: 1 U/dL
pH, UA: 6 (ref 5.0–8.0)

## 2023-11-08 NOTE — Progress Notes (Signed)
 Chief Complaint  Patient presents with   Urinary Frequency    HPI: Caitlin [name] 78 y.o. come in for Chronic disease management  Here with spouse for  a number of issues  Urinary frequency no change after years of such feels she has a small bladder  has lots of bathroom time never saw urologist .  IBS and frequency  interferes with life style and then gets anxious. Better after off diuretic .  No fever  not sure anything to do .  Says eats much better  and walking  with spouse to get around . Still has dec muscle mass .reports to eat protein  Back pain uses tylenol   Insurance nurse says had heart murmur  Fam hx of heart murmurs.  No hx of rf no change in exercise tolerance  [place]sa working trying to get a mask that fits.  Depression better since inc dose of lexapro  .  ROS: See pertinent positives and negatives per HPI.  Past Medical History:  Diagnosis Date   Allergic rhinitis 07/11/2006   Back pain 10/09/2007   Blood transfusion without reported diagnosis    Diaphragmatic hernia 07/11/2006   Diverticulosis of colon    Eczema 07/11/2006   Elevated cholesterol 04/29/2012   Enteritis    Erythema nodosum    Tends to be seasonal has had a negative chest x-ray in the past negative PPD   Essential hypertension 07/11/2006   Fatigue 03/12/2007   Fibromyalgia    Gastroparesis 12/23/2008   Generalized anxiety disorder 09/01/2014   GERD (gastroesophageal reflux disease)    History of stricture and dilatation and Nissen surgery   Hearing loss, left ear 11/27/2008   History of cataract    bilateral; s/p surgery   Hyperglycemia 08/15/2006   Irritable bowel syndrome 12/23/2008   Leg cramps, nocturnal 11/11/2007   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   MCI (mild cognitive impairment) 10/22/2018   Obstructive sleep apnea 04/23/2007   uses CPAP nightly; mask has leak   Osteoarthritis 11/28/2011   Osteopenia    Prediabetes 04/19/2011   Vitamin B12 deficiency 05/13/2010    Family  History  Problem Relation Age of Onset   Depression Mother    Kidney cancer Mother    Dementia Mother        Unspecified, likely secondary to other medical condition (cancer)   Depression Father    Hiatal hernia Father    COPD Sister    Dementia Sister        Unspecified, likely secondary to other medical condition   Dementia Brother    Heart Problems Brother    Nephrolithiasis Other    Colon cancer Neg Hx    Esophageal cancer Neg Hx    Pancreatic cancer Neg Hx    Rectal cancer Neg Hx    Stomach cancer Neg Hx     Social History   Socioeconomic History   Marital status: Married    Spouse name: Not on file   Number of children: 1   Years of education: 14   Highest education level: Associate degree: academic program  Occupational History   Occupation: Retired  Tobacco Use   Smoking status: Never   Smokeless tobacco: Never  Vaping Use   Vaping status: Never Used  Substance and Sexual Activity   Alcohol use: No    Alcohol/week: 0.0 standard drinks of alcohol   Drug use: No   Sexual activity: Not Currently  Other Topics Concern   Not on file  Social History  Narrative   Unemployed   Married   HH of 2    Has grandchildren out of state   No pets   Sis. died 23-Dec-2011COPD   [place]anch home   Right handed   Social Drivers of Health   Financial Resource Strain: Low Risk  (03/28/2023)   Overall Financial Resource Strain (CARDIA)    Difficulty of Paying Living Expenses: Not hard at all  Food Insecurity: No Food Insecurity (03/28/2023)   Hunger Vital Sign    Worried About Running Out of Food in the Last Year: Never true    Ran Out of Food in the Last Year: Never true  Transportation Needs: No Transportation Needs (03/28/2023)   PRAPARE - Administrator, Civil Service (Medical): No    Lack of Transportation (Non-Medical): No  Physical Activity: Inactive (03/28/2023)   Exercise Vital Sign    Days of Exercise per Week: 0 days    Minutes of Exercise per  Session: 30 min  Stress: Stress Concern Present (03/28/2023)   [employer] of Occupational Health - Occupational Stress Questionnaire    Feeling of Stress : Rather much  Social Connections: Moderately Isolated (03/28/2023)   Social Connection and Isolation Panel    Frequency of Communication with Friends and Family: Once a week    Frequency of Social Gatherings with Friends and Family: Once a week    Attends Religious Services: 1 to 4 times per year    Active Member of [employer] or Organizations: No    Attends Banker Meetings: Never    Marital Status: Married    Outpatient Medications Prior to Visit  Medication Sig Dispense Refill   acetaminophen  (TYLENOL ) 325 MG tablet Take 2 tablets (650 mg total) by mouth every 6 (six) hours as needed for mild pain (or Fever >/= 101).     buPROPion  (WELLBUTRIN  XL) 150 MG 24 hr tablet Take 1 tablet (150 mg total) by mouth daily. 90 tablet 3   Cholecalciferol  (VITAMIN D3) 1000 units CAPS Take 1,000 Units by mouth daily.     Cyanocobalamin  (VITAMIN B-12) 1000 MCG SUBL Place 2,000 mcg under the tongue. Every other day     dicyclomine  (BENTYL ) 10 MG capsule TAKE 1 CAPSULE BY MOUTH EVERY 8 HOURS AS NEEDED FOR  SPASMS 270 capsule 0   Docusate Calcium (STOOL SOFTENER PO) Take by mouth. Every other day     escitalopram  (LEXAPRO ) 20 MG tablet Take 1 tablet (20 mg total) by mouth daily. 90 tablet 1   famotidine  (PEPCID ) 40 MG tablet Take 1 tablet by mouth once daily 90 tablet 0   FIBER PO Take by mouth daily. Benefiber powder     memantine  (NAMENDA ) 10 MG tablet Take 1 tablet (10 mg total) by mouth 2 (two) times daily. 60 tablet 11   Multiple Vitamins-Minerals (PRESERVISION AREDS 2) CAPS Take 1 capsule by mouth 2 (two) times a week.     pantoprazole  (PROTONIX ) 40 MG tablet Take 1 tablet by mouth once daily 90 tablet 0   potassium chloride  (KLOR-CON ) 10 MEQ tablet TAKE 1 TABLET BY MOUTH EVERY OTHER DAY 45 tablet 0   simethicone (MYLICON) 80 MG  chewable tablet Chew 80 mg by mouth every 6 (six) hours as needed for flatulence.     SIMPLY SALINE 0.9 % AERS Place 1 spray into both nostrils in the morning and at bedtime.     No facility-administered medications prior to visit.     EXAM:  BP ROLLEN)  142/72 (BP Location: Left Arm, Patient Position: Sitting, Cuff Size: Normal)   Pulse 64   Temp 98.1 F (36.7 C) (Oral)   Ht 4' 8.5 (1.435 m)   Wt 95 lb (43.1 kg)   SpO2 97%   BMI 20.92 kg/m   Body mass index is 20.92 kg/m. Wt Readings from Last 3 Encounters:  11/08/23 95 lb (43.1 kg)  09/11/23 95 lb (43.1 kg)  07/31/23 94 lb 6.4 oz (42.8 kg)  Weight 103 5 24  147 9.6 10 23     GENERAL: vitals reviewed and listed above, alert, verbal  and spouse , appears well hydrated and in no acute distress frail low Muscle mass but now unassisted ambulatory and independent  HEENT: atraumatic, conjunctiva  clear, no obvious abnormalities on inspection of external nose and ears NECK: no obvious masses on inspection palpation  LUNGS: clear to auscultation bilaterally, no wheezes, rales or rhonchi, good air movement CV: HRRR, no clubbing cyanosis or  peripheral edema nl cap refill  2+/6 systolic m heart upper and apex  no thrils or clicks  MS: moves all extremities without noticeable focal  abnormality PSYCH: pleasant and cooperative, Ph q looks worse but she  seems brighter and better  today and interactive a well as her husband saying improved .  Lab Results  Component Value Date   WBC 6.8 07/31/2023   HGB 12.3 07/31/2023   HCT 37.8 07/31/2023   PLT 218.0 07/31/2023   GLUCOSE 77 07/31/2023   CHOL 174 07/31/2023   TRIG 105.0 07/31/2023   HDL 80.00 07/31/2023   LDLCALC 73 07/31/2023   ALT 9 07/31/2023   AST 13 07/31/2023   NA 143 07/31/2023   K 3.8 07/31/2023   CL 105 07/31/2023   CREATININE 0.80 07/31/2023   BUN 20 07/31/2023   CO2 30 07/31/2023   TSH 2.04 07/31/2023   HGBA1C 5.0 07/25/2022   MICROALBUR 1.1 08/17/2008   BP  Readings from Last 3 Encounters:  11/08/23 (!) 142/72  10/22/23 (!) 143/68  09/11/23 122/60    ASSESSMENT AND PLAN:  Discussed the following assessment and plan:  Urinary frequency - Plan: POCT Urinalysis Dipstick (Automated), Urine Culture  Heart murmur - Plan: ECHOCARDIOGRAM COMPLETE  Underweight  Obstructive sleep apnea  Recurrent major depression in partial remission - vs dysthymia Nocturia can be from osa  other causes  very common  but prob from small bladder n  for years and with IBS issues   Work on correctable causes . Consider bladder voiding diary and rx consider urologist if needed . I am reluctant to try OAB med at this time with poss se . She has had this problem for 20 years but does interfere with life style .  Heart m not heard before   no obv sx  refer echocardiogram and go from there .   Underweight  doing much better but dows restrict because of IBS  spouse says eats various proteins  etc and is aware.  Need I Do not feel the phq is accurate  based on visit interview etc.  See above  Ua today concentrated and 1+ leuk sent for  culture  Can change b12 level lab to next visit ( had stopped was high )  Need to follow weight and readdress  bone health  at fu  45 minutes visit today  -Patient advised to return or notify health care team  if  new concerns arise.  Patient Instructions  Consider bladder  training evaluation .  If no infection ( doubt) sleep apnea can also be associated.  Work on more nutrition  get healthy weight and eating   You will be  contacted about the echocardiogram . To check about the heart murmur.   Wt Readings from Last 3 Encounters:  11/08/23 95 lb (43.1 kg)  09/11/23 95 lb (43.1 kg)  07/31/23 94 lb 6.4 oz (42.8 kg)    Josilynn Losh K. Sweet Jarvis M.D.

## 2023-11-08 NOTE — Patient Instructions (Signed)
 Consider bladder  training evaluation .  If no infection ( doubt) sleep apnea can also be associated.  Work on more nutrition  get healthy weight and eating   You will be  contacted about the echocardiogram . To check about the heart murmur.   Wt Readings from Last 3 Encounters:  11/08/23 95 lb (43.1 kg)  09/11/23 95 lb (43.1 kg)  07/31/23 94 lb 6.4 oz (42.8 kg)

## 2023-11-09 LAB — URINE CULTURE
MICRO NUMBER:: 17139408
Result:: NO GROWTH
SPECIMEN QUALITY:: ADEQUATE

## 2023-11-12 ENCOUNTER — Ambulatory Visit: Payer: Self-pay | Admitting: Internal Medicine

## 2023-11-12 DIAGNOSIS — R011 Cardiac murmur, unspecified: Secondary | ICD-10-CM

## 2023-11-12 NOTE — Progress Notes (Signed)
 No infection in urine

## 2023-11-20 ENCOUNTER — Encounter: Payer: Self-pay | Admitting: Primary Care

## 2023-11-20 ENCOUNTER — Ambulatory Visit: Admitting: Primary Care

## 2023-11-20 VITALS — BP 120/66 | HR 70 | Temp 97.7°F | Ht <= 58 in | Wt 93.4 lb

## 2023-11-20 DIAGNOSIS — G4733 Obstructive sleep apnea (adult) (pediatric): Secondary | ICD-10-CM | POA: Diagnosis not present

## 2023-11-20 DIAGNOSIS — G4734 Idiopathic sleep related nonobstructive alveolar hypoventilation: Secondary | ICD-10-CM | POA: Diagnosis not present

## 2023-11-20 DIAGNOSIS — R011 Cardiac murmur, unspecified: Secondary | ICD-10-CM

## 2023-11-20 NOTE — Patient Instructions (Signed)
  VISIT SUMMARY: You came in today for a follow-up visit regarding your severe sleep apnea. We discussed your ongoing issues with your BiPAP machine, including the discomfort and air leaks you experience with your current mask. We also reviewed your history of IBS and its impact on your sleep, as well as your upcoming echocardiogram for your heart murmur.  YOUR PLAN: -OBSTRUCTIVE AND CENTRAL SLEEP APNEA WITH NOCTURNAL HYPOXEMIA AND SLEEP MAINTENANCE INSOMNIA: You have severe sleep apnea, which means you experience interruptions in your breathing during sleep, leading to low oxygen levels and difficulty staying asleep. We have ordered a BiPAP titration study to adjust your pressure settings. Please bring your current mask to the study.  -INTOLERANCE TO POSITIVE AIRWAY PRESSURE THERAPY: You are having trouble tolerating the higher pressures of your BiPAP machine due to discomfort and air leaks. We will adjust your pressure settings during the BiPAP titration study and may consider oxygen therapy at night if the BiPAP remains intolerable.  -CARDIAC MURMUR UNDER EVALUATION: A cardiac murmur is an unusual sound heard during a heartbeat, which can be a sign of various heart conditions. You have an echocardiogram scheduled to further evaluate this issue, as there is a potential for calcium buildup and an increased risk for pulmonary hypertension due to your sleep apnea.  INSTRUCTIONS: Please proceed with the scheduled BiPAP titration study and bring your current mask with you. We will also consider oxygen therapy at night if the BiPAP remains intolerable. Additionally, make sure to attend your scheduled echocardiogram to evaluate your heart murmur.  Follow-up 3 months with Dr. Neda

## 2023-11-20 NOTE — Progress Notes (Signed)
 @Patient  ID: Caitlin [name], female    DOB: 1945/08/28, 78 y.o.   MRN: [id number]  No chief complaint on file.   Referring provider: Charlett Apolinar POUR, MD  HPI: 78 year old, never smoked. PMH significant for HTN, OSA, allergic rhinitis, GERD, IBS, fibromyalgia, GAD, prediabetes, mild cognitive impairment.   Previous [facility] pulmonary encounter: 08/08/23- Dr. Neda   Has been doing relatively well  Still have issues with IBS  Has been using the BiPAP nightly  Concerned with the machine not working right but spouse stated that sometimes when she goes to the bathroom, she may not remember to cut it back on  Mask issues a little bit better with extra small mask  Most recent sleep study was in January 2025 continues to have multiple awakenings at night  History of fibromyalgia Still very tired during the day  Study did reveal obstructive sleep apnea titrated to BiPAP of 18/14, last pressure change was to 14/10 because of pressure intolerance  Known history of obstructive sleep apnea  Weight loss recently with other health problems  Assessment:  Pressure changes over time has been 18/14 down to 14/10 now down to 12/8 Number of AHI's is much higher   Multiple awakenings   Overall functions better during the day   Weight is stable   Plan/Recommendations:   Change BiPAP settings from 12/8  to -14/9   Continue same masks   11/20/2023 - Interim hx  Discussed the use of AI scribe software for clinical note transcription with the patient, who gave verbal consent to proceed.  Saw Dr. Neda in July, recommending changing pressure from 12/8 to 14/9. Does not appear this was done. Current settings remain at 12/8cm h20 with residual AHI 30.3/hour.  -Patient had cpap titration study in 2019, CPAP was not sufficient and switched to BIPAP 18/14 with residual AHI 13.5  -PSG in January 2025 showed severe OSA,central sleep apnea, nocturnal hypoxemia and sleep maintenance insomnia    Recommended CPAP titration study - BIPAP or ASV may be required to eliminate central apneas   History of Present Illness Caitlin [name] is a 78 year old female with severe sleep apnea who presents for a follow-up visit. She is accompanied by her husband, Caitlin [name].  She has a history of severe sleep apnea, including central sleep apnea, nocturnal hypoxemia, and sleep maintenance insomnia, confirmed by a sleep study in January 2025. She has been using a BiPAP machine since 2019 after CPAP was found insufficient. Her current BiPAP settings are 12/8 cm H2O, but she continues to experience approximately 30 apneic events per hour, primarily obstructive. The lower pressure allows her to sleep 6-8 hours, but significant apneic events persist.  She has experienced difficulty with CPAP and BiPAP mask fittings, with previous attempts in 2019 and during a hospital stay in 2004 failing to find a suitable mask. Recently, she has been using a women's extra small mask, which has helped, but she continues to experience air leaks and discomfort, impacting her sleep quality.  She has a history of IBS, which causes her to wake up multiple times at night to use the bathroom. This has been a barrier to completing a successful sleep study, as she is unsteady on her feet and requires assistance. Her IBS symptoms are managed with medication, but they fluctuate in severity.  On days she does not use the BiPAP, she feels more energetic, although she tries to avoid sleeping during the day. She experiences snoring when not using  the BiPAP, but not when it is in use. She typically goes to bed around 10:30-11:00 PM and can fall asleep quickly, but her sleep is often interrupted.  She has a history of a heart murmur and is scheduled for an echocardiogram to further evaluate this. She has not been using oxygen at night.  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     Allergies  Allergen Reactions   Codeine Phosphate Other (See Comments)    Hallucinations   Mobic  [Meloxicam ] Diarrhea   Protonix  [Pantoprazole  Sodium] Diarrhea and Other (See Comments)    Leg nodules, also   Ibuprofen Diarrhea and Other (See Comments)    Tears up the stomach   Peppermint Oil Other (See Comments)    Severe heartburn   Aleve [Naproxen] Nausea Only    Upset stomach if takes more than once a day   Colestipol  Hcl     Patient reports reaction is erythema multiforme   Omeprazole-Sodium Bicarbonate Diarrhea and Other (See Comments)    Severe diarrhea, stomach cramps   Sodium Other (See Comments)    No salt!   Sulfamethoxazole Diarrhea and Nausea And Vomiting    Immunization History  Administered Date(s) Administered   Fluad Quad(high Dose 65+) 10/30/2020, 11/01/2021   H1N1 03/09/2008   INFLUENZA, HIGH DOSE SEASONAL PF 10/06/2014, 10/01/2015, 10/05/2016, 10/12/2017, 10/03/2018, 11/01/2021, 10/22/2023   Influenza Split 11/08/2010, 10/17/2011   Influenza Whole 11/14/2006, 10/09/2007, 10/21/2008, 10/19/2009   Influenza,inj,Quad PF,6+ Mos 10/11/2012, 10/14/2013   Influenza-Unspecified 10/01/2018   Moderna Covid-19 Fall Seasonal Vaccine 67yrs & older 11/14/2021   PFIZER(Purple Top)SARS-COV-2 Vaccination 03/01/2019, 03/24/2019, 10/14/2019, 06/21/2020   PPD Test 06/05/2012   Pfizer Covid-19 Vaccine Bivalent Booster 65yrs & up 10/19/2020   Pfizer(Comirnaty)Fall Seasonal Vaccine 12 years and older 10/25/2022, 10/17/2023   Pneumococcal Conjugate Pcv21, Polysaccharide Crm197 Conjugaf 10/04/2023   Pneumococcal Conjugate-13 11/05/2012   Pneumococcal Polysaccharide-23 03/09/2008, 10/06/2014   Respiratory Syncytial Virus Vaccine,Recomb Aduvanted(Arexvy) 10/07/2022   Tdap 10/27/2011, 10/04/2023   Zoster Recombinant(Shingrix) 10/17/2017, 01/02/2018   Zoster, Live 08/08/2010    Past Medical History:  Diagnosis Date   Allergic rhinitis 07/11/2006   Back pain  10/09/2007   Blood transfusion without reported diagnosis    Diaphragmatic hernia 07/11/2006   Diverticulosis of colon    Eczema 07/11/2006   Elevated cholesterol 04/29/2012   Enteritis    Erythema nodosum    Tends to be seasonal has had a negative chest x-ray in the past negative PPD   Essential hypertension 07/11/2006   Fatigue 03/12/2007   Fibromyalgia    Gastroparesis 12/23/2008   Generalized anxiety disorder 09/01/2014   GERD (gastroesophageal reflux disease)    History of stricture and dilatation and Nissen surgery   Hearing loss, left ear 11/27/2008   History of cataract    bilateral; s/p surgery   Hyperglycemia 08/15/2006   Irritable bowel syndrome 12/23/2008   Leg cramps, nocturnal 11/11/2007   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   MCI (mild cognitive impairment) 10/22/2018   Obstructive sleep apnea 04/23/2007   uses CPAP nightly; mask has leak   Osteoarthritis 11/28/2011   Osteopenia    Prediabetes 04/19/2011   Vitamin B12 deficiency 05/13/2010    Tobacco History: Social History   Tobacco Use  Smoking Status Never  Smokeless Tobacco Never   Counseling given: Not Answered   Outpatient Medications Prior to Visit  Medication Sig Dispense Refill   acetaminophen  (TYLENOL ) 325 MG tablet Take 2 tablets (650 mg  total) by mouth every 6 (six) hours as needed for mild pain (or Fever >/= 101).     buPROPion  (WELLBUTRIN  XL) 150 MG 24 hr tablet Take 1 tablet (150 mg total) by mouth daily. 90 tablet 3   Cholecalciferol  (VITAMIN D3) 1000 units CAPS Take 1,000 Units by mouth daily.     Cyanocobalamin  (VITAMIN B-12) 1000 MCG SUBL Place 2,000 mcg under the tongue. Every other day     dicyclomine  (BENTYL ) 10 MG capsule TAKE 1 CAPSULE BY MOUTH EVERY 8 HOURS AS NEEDED FOR  SPASMS 270 capsule 0   Docusate Calcium (STOOL SOFTENER PO) Take by mouth. Every other day     escitalopram  (LEXAPRO ) 20 MG tablet Take 1 tablet (20 mg total) by mouth daily. 90 tablet 1   famotidine   (PEPCID ) 40 MG tablet Take 1 tablet by mouth once daily 90 tablet 0   FIBER PO Take by mouth daily. Benefiber powder     memantine  (NAMENDA ) 10 MG tablet Take 1 tablet (10 mg total) by mouth 2 (two) times daily. 60 tablet 11   Multiple Vitamins-Minerals (PRESERVISION AREDS 2) CAPS Take 1 capsule by mouth 2 (two) times a week.     pantoprazole  (PROTONIX ) 40 MG tablet Take 1 tablet by mouth once daily 90 tablet 0   potassium chloride  (KLOR-CON ) 10 MEQ tablet TAKE 1 TABLET BY MOUTH EVERY OTHER DAY 45 tablet 0   simethicone (MYLICON) 80 MG chewable tablet Chew 80 mg by mouth every 6 (six) hours as needed for flatulence.     SIMPLY SALINE 0.9 % AERS Place 1 spray into both nostrils in the morning and at bedtime.     No facility-administered medications prior to visit.   Review of Systems  Review of Systems  Constitutional:  Negative for fatigue.  Respiratory:  Negative for shortness of breath.   Psychiatric/Behavioral:  Positive for sleep disturbance.    Physical Exam  There were no vitals taken for this visit. Physical Exam Constitutional:      Appearance: Normal appearance.     Comments: Appears underweight/ frail   HENT:     Head: Normocephalic and atraumatic.  Cardiovascular:     Rate and Rhythm: Normal rate and regular rhythm.     Heart sounds: Murmur heard.  Pulmonary:     Effort: Pulmonary effort is normal.     Breath sounds: Normal breath sounds.     Comments: CTA Musculoskeletal:        General: Normal range of motion.     Cervical back: Normal range of motion.  Skin:    General: Skin is warm and dry.  Neurological:     General: No focal deficit present.     Mental Status: She is alert and oriented to person, place, and time. Mental status is at baseline.  Psychiatric:        Mood and Affect: Mood normal.        Behavior: Behavior normal.        Thought Content: Thought content normal.        Judgment: Judgment normal.      Lab Results:  CBC    Component  Value Date/Time   WBC 6.8 07/31/2023 1050   RBC 4.11 07/31/2023 1050   HGB 12.3 07/31/2023 1050   HCT 37.8 07/31/2023 1050   PLT 218.0 07/31/2023 1050   MCV 91.9 07/31/2023 1050   MCH 29.7 01/27/2022 0334   MCHC 32.6 07/31/2023 1050   RDW 14.3 07/31/2023 1050   LYMPHSABS  1.5 07/31/2023 1050   MONOABS 0.5 07/31/2023 1050   EOSABS 0.2 07/31/2023 1050   BASOSABS 0.1 07/31/2023 1050    BMET    Component Value Date/Time   NA 143 07/31/2023 1050   K 3.8 07/31/2023 1050   CL 105 07/31/2023 1050   CO2 30 07/31/2023 1050   GLUCOSE 77 07/31/2023 1050   BUN 20 07/31/2023 1050   CREATININE 0.80 07/31/2023 1050   CALCIUM 9.5 07/31/2023 1050   CALCIUM 10.4 05/13/2010 1225   GFRNONAA >60 01/27/2022 0334   GFRAA 56 (L) 04/28/2016 1832    BNP    Component Value Date/Time   BNP 123.0 (H) 01/26/2022 0336    ProBNP No results found for: PROBNP  Imaging: No results found.   Assessment & Plan:   No problem-specific Assessment & Plan notes found for this encounter.   There are no diagnoses linked to this encounter.  Assessment and Plan Assessment & Plan Obstructive and central sleep apnea with nocturnal hypoxemia Severe obstructive and central sleep apnea with nocturnal hypoxemia and sleep maintenance insomnia. Previous CPAP titration study in 2019 was insufficient, leading to a switch to BiPAP with residual apneic events. Overtime, BIPAP pressure settings have been lowered resulting in higher AHI. Current BiPAP settings (12/8); residual AHI 30.3 apneic events. Intolerance to higher pressures due to discomfort and air leaks. Sleep quality is impaired, with snoring when BiPAP is off. Risks of untreated sleep apnea include cardiac arrhythmias, stroke, pulmonary hypertension, and diabetes. Consideration of oxygen therapy if BiPAP remains intolerable. - Ordered BiPAP titration study to adjust pressure settings. - Bring current mask to titration study. - Will consider oxygen therapy  at night if BiPAP remains intolerable.  Intolerance to positive airway pressure therapy Intolerance to higher BiPAP pressures due to discomfort and air leaks. Current lower pressure settings allow for 6-8 hours of sleep but still result in significant apneic events. Previous attempts to find a suitable mask have been unsuccessful. Consideration of oxygen therapy if BiPAP remains intolerable. - Ordered BiPAP titration study to adjust pressure settings. - Will consider oxygen therapy at night if BiPAP remains intolerable.  Cardiac murmur under evaluation Cardiac murmur identified, with an echocardiogram scheduled for further evaluation. Potential calcium buildup noted by cardiologist. Increased risk for pulmonary hypertension due to sleep apnea. - Proceed with scheduled echocardiogram.  Recording duration: 14 minutes  Almarie LELON Ferrari, NP 11/20/2023

## 2023-11-22 ENCOUNTER — Ambulatory Visit
Admission: RE | Admit: 2023-11-22 | Discharge: 2023-11-22 | Disposition: A | Source: Ambulatory Visit | Attending: Internal Medicine | Admitting: Internal Medicine

## 2023-11-22 DIAGNOSIS — Z1231 Encounter for screening mammogram for malignant neoplasm of breast: Secondary | ICD-10-CM

## 2023-11-26 ENCOUNTER — Ambulatory Visit (HOSPITAL_COMMUNITY)
Admission: RE | Admit: 2023-11-26 | Discharge: 2023-11-26 | Disposition: A | Discharge status: Home / Self Care | Source: Ambulatory Visit | Attending: Cardiology | Admitting: Cardiology

## 2023-11-26 DIAGNOSIS — R011 Cardiac murmur, unspecified: Secondary | ICD-10-CM | POA: Insufficient documentation

## 2023-11-26 LAB — ECHOCARDIOGRAM COMPLETE
AR max vel: 1.28 cm2
AV Area VTI: 1.2 cm2
AV Area mean vel: 1.23 cm2
AV Mean grad: 11.7 mmHg
AV Peak grad: 23.9 mmHg
Ao pk vel: 2.44 m/s
Area-P 1/2: 2.9 cm2
S' Lateral: 2.4 cm

## 2023-11-28 ENCOUNTER — Other Ambulatory Visit

## 2023-11-28 DIAGNOSIS — Z79899 Other long term (current) drug therapy: Secondary | ICD-10-CM

## 2023-11-28 NOTE — Progress Notes (Signed)
 11/28/2023 Name: Caitlin [name] MRN: [id number] DOB: 1945/09/13  Chief Complaint  Patient presents with   Medication Management    [name] is a 78 y.o. year old female who presented for a telephone visit.   They were referred to the pharmacist by their PCP for assistance in managing complex medication management.    Subjective:  Care Team: Primary Care Provider: Panosh, Wanda [name], [name] , Next Scheduled Visit: 01/22/24  Medication Access/Adherence  Current Pharmacy:  [facility] [address] [place] - [address] [place] [address] Phone: [phone] Fax: [phone]   Patient reports affordability concerns with their medications: No  Patient reports access/transportation concerns to their pharmacy: No  Patient reports adherence concerns with their medications:  No     Depression/Anxiety:  Current medications: Escitalpram 20mg , Wellbutrin  XL 150mg  Medications tried in the past: Sertraline   Behavioral Health support: None  Patient and husband feel the lexapro  dose increase to 20mg  (up from 15mg ) is going well and helping with anxiety.   Both feel the main concern at this point is her lack of quality sleep, has a new sleep study sch for Jan to explore treatment options. They are hopefully a good treatment plan can be followed and that higher quality sleep will improve her mood, IBS and energy levels.   Objective:  Lab Results  Component Value Date   HGBA1C 5.0 07/25/2022    Lab Results  Component Value Date   CREATININE 0.80 07/31/2023   BUN 20 07/31/2023   NA 143 07/31/2023   [name] 3.8 07/31/2023   CL 105 07/31/2023   CO2 30 07/31/2023    Lab Results  Component Value Date   CHOL 174 07/31/2023   HDL 80.00 07/31/2023   LDLCALC 73 07/31/2023   TRIG 105.0 07/31/2023   CHOLHDL 2 07/31/2023    Medications Reviewed Today     Reviewed by Caitlin [name] (Pharmacist) on 11/28/23 at 1302  Med List Status:  <None>   Medication Order Taking? Sig Documenting Provider Last Dose Status Informant  acetaminophen  (TYLENOL ) 325 MG tablet [id number]  Take 2 tablets (650 mg total) by mouth every 6 (six) hours as needed for mild pain (or Fever >/= 101). Shalhoub, Zachary PARAS, [name]  Active   buPROPion  (WELLBUTRIN  XL) 150 MG 24 hr tablet [id number]  Take 1 tablet (150 mg total) by mouth daily. Panosh, Caitlin [name]  Active   Cholecalciferol  (VITAMIN D3) 1000 units CAPS [id number]  Take 1,000 Units by mouth daily. Provider, Historical, [name]  Active Spouse/Significant Other  Cyanocobalamin  (VITAMIN B-12) 1000 MCG SUBL [id number]  Place 2,000 mcg under the tongue. Every other day Provider, Historical, [name]  Active Spouse/Significant Other  dicyclomine  (BENTYL ) 10 MG capsule [id number]  TAKE 1 CAPSULE BY MOUTH EVERY 8 HOURS AS NEEDED FOR  SPASMS Collier, Amanda R, PA-C  Active   Docusate Calcium (STOOL SOFTENER PO) [id number]  Take by mouth. Every other day Provider, Historical, [name]  Active   escitalopram  (LEXAPRO ) 20 MG tablet [id number]  Take 1 tablet (20 mg total) by mouth daily. Panosh, Caitlin [name]  Active   esomeprazole  (NEXIUM ) 40 MG capsule [id number]  Take 40 mg by mouth every morning. Provider, Historical, [name]  Active   famotidine  (PEPCID ) 40 MG tablet [id number]  Take 1 tablet by mouth once daily Nandigam, Kavitha [name], [name]  Active   FIBER PO [id number]  Take by mouth daily. Benefiber powder Provider, Historical, [name]  Active   memantine  (  NAMENDA ) 10 MG tablet [id number]  Take 1 tablet (10 mg total) by mouth 2 (two) times daily. Wertman, Sara E, PA-C  Active   Multiple Vitamins-Minerals (PRESERVISION AREDS 2) CAPS [id number]  Take 1 capsule by mouth 2 (two) times a week. Provider, Historical, [name]  Active Spouse/Significant Other  pantoprazole  (PROTONIX ) 40 MG tablet [id number]  Take 1 tablet by mouth once daily Nandigam, Kavitha [name], [name]  Active   potassium chloride  (KLOR-CON ) 10 MEQ tablet [id number]  TAKE 1 TABLET BY MOUTH EVERY OTHER DAY  Panosh, Wanda [name], [name]  Active   simethicone (MYLICON) 80 MG chewable tablet [id number]  Chew 80 mg by mouth every 6 (six) hours as needed for flatulence. Provider, Historical, [name]  Active   SIMPLY SALINE 0.9 % AERS [id number]  Place 1 spray into both nostrils in the morning and at bedtime. Provider, Historical, [name]  Active Spouse/Significant Other              Assessment/Plan:   Depression/Anxiety: - Currently controlled  -Patient feels mood is stable right now, continue current med therapy -They would like a follow up call in Feb to assess mood again and any possible need for changes in med therapy or behavioral health referral   Follow Up: 3 months  Caitlin [name], PharmD Clinical Pharmacist [phone]

## 2023-11-28 NOTE — Progress Notes (Signed)
 Heart muscle is pumping well .  But there is calcium build up on the aortic valve  most likely causing the murmur heard   this condition  should be followed over time .  Please order  a referral consult with cardiology  and someone should contact you about appt. ( Dx  murmur and calcified aortic valve )

## 2023-12-03 ENCOUNTER — Ambulatory Visit

## 2023-12-03 VITALS — BP 120/60 | HR 62 | Temp 98.1°F | Ht <= 58 in | Wt 93.0 lb

## 2023-12-03 DIAGNOSIS — Z0000 Encounter for general adult medical examination without abnormal findings: Secondary | ICD-10-CM

## 2023-12-03 NOTE — Progress Notes (Signed)
 Chief Complaint  Patient presents with   Medicare Wellness     Subjective:   Caitlin [name] is a 78 y.o. female who presents for a Medicare Annual Wellness Visit.  Allergies (verified) Codeine phosphate, Mobic  [meloxicam ], Protonix  [pantoprazole  sodium], Ibuprofen, Peppermint oil, Aleve [naproxen], Colestipol  hcl, Omeprazole-sodium bicarbonate, Sodium, and Sulfamethoxazole   History: Past Medical History:  Diagnosis Date   Allergic rhinitis 07/11/2006   Back pain 10/09/2007   Blood transfusion without reported diagnosis    Diaphragmatic hernia 07/11/2006   Diverticulosis of colon    Eczema 07/11/2006   Elevated cholesterol 04/29/2012   Enteritis    Erythema nodosum    Tends to be seasonal has had a negative chest x-ray in the past negative PPD   Essential hypertension 07/11/2006   Fatigue 03/12/2007   Fibromyalgia    Gastroparesis 12/23/2008   Generalized anxiety disorder 09/01/2014   GERD (gastroesophageal reflux disease)    History of stricture and dilatation and Nissen surgery   Hearing loss, left ear 11/27/2008   History of cataract    bilateral; s/p surgery   Hyperglycemia 08/15/2006   Irritable bowel syndrome 12/23/2008   Leg cramps, nocturnal 11/11/2007   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   MCI (mild cognitive impairment) 10/22/2018   Obstructive sleep apnea 04/23/2007   uses CPAP nightly; mask has leak   Osteoarthritis 11/28/2011   Osteopenia    Prediabetes 04/19/2011   Vitamin B12 deficiency 05/13/2010   Past Surgical History:  Procedure Laterality Date   ABDOMINAL HYSTERECTOMY     bone spur     CATARACT EXTRACTION W/ INTRAOCULAR LENS  IMPLANT, BILATERAL     CESAREAN SECTION     x 1   CHOLECYSTECTOMY     COLONOSCOPY     DENTAL SURGERY     ESOPHAGOGASTRODUODENOSCOPY     stricture and dilatation   IR KYPHO EA ADDL LEVEL THORACIC OR LUMBAR  04/28/2022   IR KYPHO THORACIC WITH BONE BIOPSY  04/28/2022   IR RADIOLOGIST EVAL & MGMT  04/20/2022   IR  RADIOLOGIST EVAL & MGMT  05/19/2022   left shoulder surgery  06/08, 07/12/17   x 2   MOUTH SURGERY Right 06/03/2019   NISSEN FUNDOPLICATION     ROTATOR CUFF REPAIR Left    rt knee surgery     scope   UPPER GASTROINTESTINAL ENDOSCOPY     Family History  Problem Relation Age of Onset   Depression Mother    Kidney cancer Mother    Dementia Mother        Unspecified, likely secondary to other medical condition (cancer)   Depression Father    Hiatal hernia Father    COPD Sister    Dementia Sister        Unspecified, likely secondary to other medical condition   Dementia Brother    Heart Problems Brother    Nephrolithiasis Other    Colon cancer Neg Hx    Esophageal cancer Neg Hx    Pancreatic cancer Neg Hx    Rectal cancer Neg Hx    Stomach cancer Neg Hx    Breast cancer Neg Hx    Social History   Occupational History   Occupation: Retired  Tobacco Use   Smoking status: Never   Smokeless tobacco: Never  Vaping Use   Vaping status: Never Used  Substance and Sexual Activity   Alcohol use: No    Alcohol/week: 0.0 standard drinks of alcohol   Drug use: No   Sexual activity:  Not Currently   Tobacco Counseling Counseling given: No  SDOH Screenings   Food Insecurity: No Food Insecurity (12/03/2023)  Housing: Unknown (12/03/2023)  Transportation Needs: No Transportation Needs (12/03/2023)  Utilities: Not At Risk (12/03/2023)  Alcohol Screen: Low Risk  (09/28/2022)  Depression (PHQ2-9): Low Risk  (12/03/2023)  Recent Concern: Depression (PHQ2-9) - High Risk (11/08/2023)  Financial Resource Strain: Low Risk  (03/28/2023)  Physical Activity: Inactive (12/03/2023)  Social Connections: Socially Integrated (12/03/2023)  Stress: No Stress Concern Present (12/03/2023)  Tobacco Use: Low Risk  (12/03/2023)  Health Literacy: Adequate Health Literacy (12/03/2023)   See flowsheets for full screening details  Depression Screen PHQ 2 & 9 Depression Scale- Over the past 2 weeks, how  often have you been bothered by any of the following problems? Little interest or pleasure in doing things: 0 Feeling down, depressed, or hopeless (PHQ Adolescent also includes...irritable): 0 PHQ-2 Total Score: 0 Trouble falling or staying asleep, or sleeping too much: 0 Feeling tired or having little energy: 0 Poor appetite or overeating (PHQ Adolescent also includes...weight loss): 0 Feeling bad about yourself - or that you are a failure or have let yourself or your family down: 0 Trouble concentrating on things, such as reading the newspaper or watching television (PHQ Adolescent also includes...like school work): 0 Moving or speaking so slowly that other people could have noticed. Or the opposite - being so fidgety or restless that you have been moving around a lot more than usual: 0 Thoughts that you would be better off dead, or of hurting yourself in some way: 0 PHQ-9 Total Score: 0 If you checked off any problems, how difficult have these problems made it for you to do your work, take care of things at home, or get along with other people?: Not difficult at all  Depression Treatment Depression Interventions/Treatment : Currently on Treatment     Goals Addressed               This Visit's Progress     Increase physical activity (pt-stated)        Remain active       Visit info / Clinical Intake: Medicare Wellness Visit Type:: Subsequent Annual Wellness Visit Persons participating in visit:: patient Medicare Wellness Visit Mode:: In-person (required for WTM) Information given by:: patient Interpreter Needed?: No Pre-visit prep was completed: no AWV questionnaire completed by patient prior to visit?: no Living arrangements:: lives with spouse/significant other Patient's Overall Health Status Rating: (!) fair Typical amount of pain: some (Followed by medical attention) Does pain affect daily life?: (!) yes Are you currently prescribed opioids?: no  Dietary Habits and  Nutritional Risks How many meals a day?: 3 Eats fruit and vegetables daily?: (!) no Most meals are obtained by: eating out In the last 2 weeks, have you had any of the following?: none Diabetic:: no  Functional Status Activities of Daily Living (to include ambulation/medication): Independent Ambulation: Independent with device- listed below Home Assistive Devices/Equipment: Eyeglasses; Walker (specify Type) Medication Administration: Independent (Husband assist) Home Management: Needs assistance (comment) (Husband assist) Manage your own finances?: (!) no (Husband) Primary transportation is: driving (Husband drives) Concerns about vision?: no *vision screening is required for WTM* Concerns about hearing?: no  Fall Screening Falls in the past year?: 0 Number of falls in past year: 0 Was there an injury with Fall?: 0 Fall Risk Category Calculator: 0 Patient Fall Risk Level: Low Fall Risk  Fall Risk Patient at Risk for Falls Due to: No Fall Risks  Fall risk Follow up: Falls evaluation completed  Home and Transportation Safety: All rugs have non-skid backing?: yes All stairs or steps have railings?: N/A, no stairs Grab bars in the bathtub or shower?: yes Have non-skid surface in bathtub or shower?: yes Good home lighting?: yes Regular seat belt use?: yes Hospital stays in the last year:: no  Cognitive Assessment Difficulty concentrating, remembering, or making decisions? : yes (Husband assist) Will 6CIT or Mini Cog be Completed: yes What year is it?: 0 points What month is it?: 0 points Give patient an address phrase to remember (5 components): [address] [place]  About what time is it?: 0 points Count backwards from 20 to 1: 2 points Say the months of the year in reverse: 0 points Repeat the address phrase from earlier: 4 points 6 CIT Score: 6 points  Advance Directives (For Healthcare) Does Patient Have a Medical Advance Directive?: Yes Does patient want to make  changes to medical advance directive?: No - Patient declined Type of Advance Directive: Healthcare Power of [place]; Living will Copy of Healthcare Power of Attorney in Chart?: No - copy requested Copy of Living Will in Chart?: No - copy requested  Reviewed/Updated  Reviewed/Updated: Reviewed All (Medical, Surgical, Family, Medications, Allergies, Care Teams, Patient Goals)        Objective:    Today's Vitals   12/03/23 1255  BP: 120/60  Pulse: 62  Temp: 98.1 F (36.7 C)  TempSrc: Oral  SpO2: 93%  Weight: 93 lb (42.2 kg)  Height: 4' 8 (1.422 m)   Body mass index is 20.85 kg/m.  Current Medications (verified) Outpatient Encounter Medications as of 12/03/2023  Medication Sig   acetaminophen  (TYLENOL ) 325 MG tablet Take 2 tablets (650 mg total) by mouth every 6 (six) hours as needed for mild pain (or Fever >/= 101).   buPROPion  (WELLBUTRIN  XL) 150 MG 24 hr tablet Take 1 tablet (150 mg total) by mouth daily.   Cholecalciferol  (VITAMIN D3) 1000 units CAPS Take 1,000 Units by mouth daily.   Cyanocobalamin  (VITAMIN B-12) 1000 MCG SUBL Place 2,000 mcg under the tongue. Every other day   dicyclomine  (BENTYL ) 10 MG capsule TAKE 1 CAPSULE BY MOUTH EVERY 8 HOURS AS NEEDED FOR  SPASMS   Docusate Calcium (STOOL SOFTENER PO) Take by mouth. Every other day   escitalopram  (LEXAPRO ) 20 MG tablet Take 1 tablet (20 mg total) by mouth daily.   esomeprazole  (NEXIUM ) 40 MG capsule Take 40 mg by mouth every morning.   famotidine  (PEPCID ) 40 MG tablet Take 1 tablet by mouth once daily (Patient not taking: Reported on 12/03/2023)   FIBER PO Take by mouth daily. Benefiber powder   memantine  (NAMENDA ) 10 MG tablet Take 1 tablet (10 mg total) by mouth 2 (two) times daily.   Multiple Vitamins-Minerals (PRESERVISION AREDS 2) CAPS Take 1 capsule by mouth 2 (two) times a week.   pantoprazole  (PROTONIX ) 40 MG tablet Take 1 tablet by mouth once daily   potassium chloride  (KLOR-CON ) 10 MEQ tablet TAKE 1  TABLET BY MOUTH EVERY OTHER DAY   simethicone (MYLICON) 80 MG chewable tablet Chew 80 mg by mouth every 6 (six) hours as needed for flatulence.   SIMPLY SALINE 0.9 % AERS Place 1 spray into both nostrils in the morning and at bedtime.   No facility-administered encounter medications on file as of 12/03/2023.   Hearing/Vision screen Hearing Screening - Comments:: Denies hearing difficulties   Vision Screening - Comments:: Wears rx glasses - up to  date with routine eye exams with  Dr Juvenal Immunizations and Health Maintenance Health Maintenance  Topic Date Due   COVID-19 Vaccine (9 - Pfizer risk 2025-26 season) 04/16/2024   Mammogram  11/21/2024   Medicare Annual Wellness (AWV)  12/02/2024   DTaP/Tdap/Td (3 - Td or Tdap) 10/03/2033   Pneumococcal Vaccine: 50+ Years  Completed   Influenza Vaccine  Completed   DEXA SCAN  Completed   Hepatitis C Screening  Completed   Zoster Vaccines- Shingrix  Completed   Meningococcal B Vaccine  Aged Out   Colonoscopy  Discontinued        Assessment/Plan:  This is a routine wellness examination for [place].  Patient Care Team: Panosh, Apolinar POUR, MD as PCP - Diedre Joshua Blamer, MD as Consulting Physician (Dermatology) Cleatus Collar, MD as Attending Physician (Ophthalmology) Court Pulling, MD as Consulting Physician (Dermatology) Georjean Darice HERO, MD as Consulting Physician (Neurology) Neda Jennet LABOR, MD as Consulting Physician (Pulmonary Disease) Wertman, Sara E, PA-C (Neurology) Nandigam, Kavitha V, MD as Consulting Physician (Gastroenterology) Lionell Jon DEL, [facility] (Pharmacist)  I have personally reviewed and noted the following in the patient's chart:   Medical and social history Use of alcohol, tobacco or illicit drugs  Current medications and supplements including opioid prescriptions. Functional ability and status Nutritional status Physical activity Advanced directives List of other physicians Hospitalizations, surgeries,  and ER visits in previous 12 months Vitals Screenings to include cognitive, depression, and falls Referrals and appointments  No orders of the defined types were placed in this encounter.  In addition, I have reviewed and discussed with patient certain preventive protocols, quality metrics, and best practice recommendations. A written personalized care plan for preventive services as well as general preventive health recommendations were provided to patient.   Rojelio LELON Blush, LPN   [id number]   Return in 1 year on 12/08/24  After Visit Summary: (In Person-Printed) AVS printed and given to the patient  Nurse Notes: None

## 2023-12-03 NOTE — Patient Instructions (Addendum)
 Caitlin [name],  Thank you for taking the time for your Medicare Wellness Visit. I appreciate your continued commitment to your health goals. Please review the care plan we discussed, and feel free to reach out if I can assist you further.  Please note that Annual Wellness Visits do not include a physical exam. Some assessments may be limited, especially if the visit was conducted virtually. If needed, we may recommend an in-person follow-up with your provider.  Ongoing Care Seeing your primary care provider every 3 to 6 months helps us  monitor your health and provide consistent, personalized care.    Referrals If a referral was made during today's visit and you haven't received any updates within two weeks, please contact the referred provider directly to check on the status.  Recommended Screenings:  Health Maintenance  Topic Date Due   COVID-19 Vaccine (9 - Pfizer risk 2025-26 season) 04/16/2024   Breast Cancer Screening  11/21/2024   Medicare Annual Wellness Visit  12/02/2024   DTaP/Tdap/Td vaccine (3 - Td or Tdap) 10/03/2033   Pneumococcal Vaccine for age over 13  Completed   Flu Shot  Completed   DEXA scan (bone density measurement)  Completed   Hepatitis C Screening  Completed   Zoster (Shingles) Vaccine  Completed   Meningitis B Vaccine  Aged Out   Colon Cancer Screening  Discontinued       12/03/2023    1:07 PM  Advanced Directives  Does Patient Have a Medical Advance Directive? Yes  Type of Estate Agent of [place];Living will  Does patient want to make changes to medical advance directive? No - Patient declined  Copy of Healthcare Power of Attorney in Chart? No - copy requested    Vision: Annual vision screenings are recommended for early detection of glaucoma, cataracts, and diabetic retinopathy. These exams can also reveal signs of chronic conditions such as diabetes and high blood pressure.  Dental: Annual dental screenings help detect early  signs of oral cancer, gum disease, and other conditions linked to overall health, including heart disease and diabetes.  Please see the attached documents for additional preventive care recommendations.

## 2023-12-21 ENCOUNTER — Other Ambulatory Visit

## 2023-12-24 LAB — OPHTHALMOLOGY REPORT-SCANNED

## 2024-01-03 ENCOUNTER — Other Ambulatory Visit

## 2024-01-22 ENCOUNTER — Ambulatory Visit: Admitting: Internal Medicine

## 2024-01-22 ENCOUNTER — Encounter: Payer: Self-pay | Admitting: Internal Medicine

## 2024-01-22 VITALS — BP 144/78 | HR 67 | Temp 98.2°F | Ht <= 58 in | Wt 94.0 lb

## 2024-01-22 DIAGNOSIS — Z79899 Other long term (current) drug therapy: Secondary | ICD-10-CM

## 2024-01-22 DIAGNOSIS — R636 Underweight: Secondary | ICD-10-CM | POA: Diagnosis not present

## 2024-01-22 DIAGNOSIS — I350 Nonrheumatic aortic (valve) stenosis: Secondary | ICD-10-CM

## 2024-01-22 DIAGNOSIS — F3341 Major depressive disorder, recurrent, in partial remission: Secondary | ICD-10-CM | POA: Diagnosis not present

## 2024-01-22 DIAGNOSIS — R011 Cardiac murmur, unspecified: Secondary | ICD-10-CM | POA: Diagnosis not present

## 2024-01-22 DIAGNOSIS — G4731 Primary central sleep apnea: Secondary | ICD-10-CM | POA: Diagnosis not present

## 2024-01-22 NOTE — Patient Instructions (Addendum)
 Good to see you today .   Murmur is  the same on exam.   I agree with   following  the echo in 12 - 15 mos and then cards eval .   Bp up for you  but  can follow at home   Would like   weight nutrition  to be  optimize.   Wt Readings from Last 3 Encounters:  01/22/24 94 lb (42.6 kg)  12/03/23 93 lb (42.2 kg)  11/20/23 93 lb 6.4 oz (42.4 kg)

## 2024-01-22 NOTE — Progress Notes (Signed)
 "  Chief Complaint  Patient presents with   Medical Management of Chronic Issues    Pt is here with spouse. Spouse reports he spoke to a friend doctor regarding to echocardiogram. Was advise he would refer to dr if necessary    HPI: Caitlin [name] 79 y.o. come in for Chronic disease management  here with spouse today for fu  Sleep study  next week.  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    Ok to wait   12- 15 mos fu echo and then refer as indicated . So didn do formal cards appt.) Eating  well  small amounts  frequently  More mobile around house tasks  Nocturia continues  as above  Mood  abut the same  but may be slightly better than baseline ROS: See pertinent positives and negatives per HPI.  Past Medical History:  Diagnosis Date   Allergic rhinitis 07/11/2006   Back pain 10/09/2007   Blood transfusion without reported diagnosis    Diaphragmatic hernia 07/11/2006   Diverticulosis of colon    Eczema 07/11/2006   Elevated cholesterol 04/29/2012   Enteritis    Erythema nodosum    Tends to be seasonal has had a negative chest x-ray in the past negative PPD   Essential hypertension 07/11/2006   Fatigue 03/12/2007   Fibromyalgia    Gastroparesis 12/23/2008   Generalized anxiety disorder 09/01/2014   GERD (gastroesophageal reflux disease)    History of stricture and dilatation and Nissen surgery   Hearing loss, left ear 11/27/2008   History of cataract    bilateral; s/p surgery   Hyperglycemia 08/15/2006   Irritable bowel syndrome 12/23/2008   Leg cramps, nocturnal 11/11/2007   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   MCI (mild cognitive impairment) 10/22/2018   Obstructive sleep apnea 04/23/2007   uses CPAP nightly; mask has leak   Osteoarthritis 11/28/2011   Osteopenia    Prediabetes 04/19/2011   Vitamin B12 deficiency 05/13/2010    Family History   Problem Relation Age of Onset   Depression Mother    Kidney cancer Mother    Dementia Mother        Unspecified, likely secondary to other medical condition (cancer)   Depression Father    Hiatal hernia Father    COPD Sister    Dementia Sister        Unspecified, likely secondary to other medical condition   Dementia Brother    Heart Problems Brother    Nephrolithiasis Other    Colon cancer Neg Hx    Esophageal cancer Neg Hx    Pancreatic cancer Neg Hx    Rectal cancer Neg Hx    Stomach cancer Neg Hx    Breast cancer Neg Hx     Social History   Socioeconomic History   Marital status: Married    Spouse name: Not on file   Number of children: 1   Years of education: 14   Highest education level: Associate degree: academic program  Occupational History   Occupation: Retired  Tobacco Use   Smoking status: Never   Smokeless tobacco: Never  Vaping Use   Vaping status: Never Used  Substance and Sexual Activity   Alcohol use: No    Alcohol/week: 0.0 standard drinks of alcohol   Drug use: No   Sexual activity: Not Currently  Other Topics Concern  Not on file  Social History Narrative   Unemployed   Married   HH of 2    Has grandchildren out of state   No pets   Sis. died 01-Dec-2011COPD   [place]anch home   Right handed   Social Drivers of Health   Tobacco Use: Low Risk (01/22/2024)   Patient History    Smoking Tobacco Use: Never    Smokeless Tobacco Use: Never    Passive Exposure: Not on file  Financial Resource Strain: Low Risk (03/28/2023)   Overall Financial Resource Strain (CARDIA)    Difficulty of Paying Living Expenses: Not hard at all  Food Insecurity: No Food Insecurity (12/03/2023)   Epic    Worried About Programme Researcher, Broadcasting/film/video in the Last Year: Never true    Ran Out of Food in the Last Year: Never true  Transportation Needs: No Transportation Needs (12/03/2023)   Epic    Lack of Transportation (Medical): No    Lack of Transportation (Non-Medical): No   Physical Activity: Inactive (12/03/2023)   Exercise Vital Sign    Days of Exercise per Week: 0 days    Minutes of Exercise per Session: 0 min  Stress: No Stress Concern Present (12/03/2023)   [employer] of Occupational Health - Occupational Stress Questionnaire    Feeling of Stress: Not at all  Social Connections: Socially Integrated (12/03/2023)   Social Connection and Isolation Panel    Frequency of Communication with Friends and Family: More than three times a week    Frequency of Social Gatherings with Friends and Family: More than three times a week    Attends Religious Services: More than 4 times per year    Active Member of Clubs or Organizations: Yes    Attends Banker Meetings: More than 4 times per year    Marital Status: Married  Depression (PHQ2-9): Low Risk (12/03/2023)   Depression (PHQ2-9)    PHQ-2 Score: 0  Recent Concern: Depression (PHQ2-9) - High Risk (11/08/2023)   Depression (PHQ2-9)    PHQ-2 Score: 15  Alcohol Screen: Low Risk (09/28/2022)   Alcohol Screen    Last Alcohol Screening Score (AUDIT): 0  Housing: Unknown (12/03/2023)   Epic    Unable to Pay for Housing in the Last Year: No    Number of Times Moved in the Last Year: Not on file    Homeless in the Last Year: No  Utilities: Not At Risk (12/03/2023)   Epic    Threatened with loss of utilities: No  Health Literacy: Adequate Health Literacy (12/03/2023)   B1300 Health Literacy    Frequency of need for help with medical instructions: Never    Outpatient Medications Prior to Visit  Medication Sig Dispense Refill   acetaminophen  (TYLENOL ) 325 MG tablet Take 2 tablets (650 mg total) by mouth every 6 (six) hours as needed for mild pain (or Fever >/= 101).     buPROPion  (WELLBUTRIN  XL) 150 MG 24 hr tablet Take 1 tablet (150 mg total) by mouth daily. 90 tablet 3   Cholecalciferol  (VITAMIN D3) 1000 units CAPS Take 1,000 Units by mouth daily.     Cyanocobalamin  (VITAMIN B-12) 1000 MCG  SUBL Place 2,000 mcg under the tongue. Every other day     dicyclomine  (BENTYL ) 10 MG capsule TAKE 1 CAPSULE BY MOUTH EVERY 8 HOURS AS NEEDED FOR  SPASMS 270 capsule 0   Docusate Calcium (STOOL SOFTENER PO) Take by mouth. Every other day  escitalopram  (LEXAPRO ) 20 MG tablet Take 1 tablet (20 mg total) by mouth daily. 90 tablet 1   esomeprazole  (NEXIUM ) 40 MG capsule Take 40 mg by mouth every morning.     FIBER PO Take by mouth daily. Benefiber powder     memantine  (NAMENDA ) 10 MG tablet Take 1 tablet (10 mg total) by mouth 2 (two) times daily. 60 tablet 11   Multiple Vitamins-Minerals (PRESERVISION AREDS 2) CAPS Take 1 capsule by mouth 2 (two) times a week.     pantoprazole  (PROTONIX ) 40 MG tablet Take 1 tablet by mouth once daily 90 tablet 0   simethicone (MYLICON) 80 MG chewable tablet Chew 80 mg by mouth every 6 (six) hours as needed for flatulence.     SIMPLY SALINE 0.9 % AERS Place 1 spray into both nostrils in the morning and at bedtime.     potassium chloride  (KLOR-CON ) 10 MEQ tablet TAKE 1 TABLET BY MOUTH EVERY OTHER DAY 45 tablet 0   famotidine  (PEPCID ) 40 MG tablet Take 1 tablet by mouth once daily (Patient not taking: Reported on 01/22/2024) 90 tablet 0   No facility-administered medications prior to visit.     EXAM:  BP (!) 144/78 (BP Location: Left Arm, Patient Position: Sitting, Cuff Size: Normal)   Pulse 67   Temp 98.2 F (36.8 C) (Oral)   Ht 4' 8 (1.422 m)   Wt 94 lb (42.6 kg)   SpO2 96%   BMI 21.07 kg/m   Body mass index is 21.07 kg/m. Wt Readings from Last 3 Encounters:  01/22/24 94 lb (42.6 kg)  12/03/23 93 lb (42.2 kg)  11/20/23 93 lb 6.4 oz (42.4 kg)  Bp repeat 140/70 also   GENERAL: vitals reviewed and listed above, alert, oriented, appears well hydrated and in no acute distress frail body  myscle mass independent  gait  more verbal  interaction today  HEENT: atraumatic, conjunctiva  clear, no obvious abnormalities on inspection of external nose and ears  OP : no lesion edema or exudate  NECK: no obvious masses on inspection palpation  LUNGS: clear to auscultation bilaterally, no wheezes, rales or rhonchi, good air movement CV: HRRR,  2/6 sem upper sternal  borders  no clubbing cyanosis or  peripheral edema nl cap refill  MS: moves all extremities without noticeable focal  abnormality PSYCH: pleasant and cooperative,more  verbal   cog not formally tested today  Lab Results  Component Value Date   WBC 6.8 07/31/2023   HGB 12.3 07/31/2023   HCT 37.8 07/31/2023   PLT 218.0 07/31/2023   GLUCOSE 77 07/31/2023   CHOL 174 07/31/2023   TRIG 105.0 07/31/2023   HDL 80.00 07/31/2023   LDLCALC 73 07/31/2023   ALT 9 07/31/2023   AST 13 07/31/2023   NA 143 07/31/2023   K 3.8 07/31/2023   CL 105 07/31/2023   CREATININE 0.80 07/31/2023   BUN 20 07/31/2023   CO2 30 07/31/2023   TSH 2.04 07/31/2023   HGBA1C 5.0 07/25/2022   MICROALBUR 1.1 08/17/2008   BP Readings from Last 3 Encounters:  01/22/24 (!) 144/78  12/03/23 120/60  11/20/23 120/66    ASSESSMENT AND PLAN:  Discussed the following assessment and plan:  Underweight  Recurrent major depression in partial remission  Medication management  Heart murmur  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 Cont attention to nutrition   as underweight but no more weight loss  Advice   as per instructions -Patient advised to return or notify health care team  if  new concerns arise.  Patient Instructions  Good to see you today .   Murmur is  the same on exam.   I agree with   following  the echo in 12 - 15 mos and then cards eval .   Bp up for you  but  can follow at home   Would like   weight nutrition  to be  optimize.   Wt Readings from Last 3 Encounters:  01/22/24 94 lb (42.6 kg)  12/03/23  93 lb (42.2 kg)  11/20/23 93 lb 6.4 oz (42.4 kg)      Caitlin [name] K. Shivaun Bilello M.D. "

## 2024-01-24 ENCOUNTER — Other Ambulatory Visit: Payer: Self-pay | Admitting: Internal Medicine

## 2024-01-24 ENCOUNTER — Other Ambulatory Visit: Payer: Self-pay | Admitting: Gastroenterology

## 2024-01-29 ENCOUNTER — Ambulatory Visit (HOSPITAL_BASED_OUTPATIENT_CLINIC_OR_DEPARTMENT_OTHER): Attending: Primary Care | Admitting: Pulmonary Disease

## 2024-01-29 DIAGNOSIS — G4733 Obstructive sleep apnea (adult) (pediatric): Secondary | ICD-10-CM | POA: Diagnosis present

## 2024-01-31 ENCOUNTER — Ambulatory Visit (HOSPITAL_BASED_OUTPATIENT_CLINIC_OR_DEPARTMENT_OTHER)
Admission: RE | Admit: 2024-01-31 | Discharge: 2024-01-31 | Disposition: A | Discharge status: Home / Self Care | Source: Ambulatory Visit | Attending: Internal Medicine | Admitting: Internal Medicine

## 2024-01-31 DIAGNOSIS — Z8781 Personal history of (healed) traumatic fracture: Secondary | ICD-10-CM | POA: Diagnosis present

## 2024-01-31 DIAGNOSIS — E2839 Other primary ovarian failure: Secondary | ICD-10-CM | POA: Insufficient documentation

## 2024-02-02 ENCOUNTER — Telehealth: Payer: Self-pay | Admitting: Pulmonary Disease

## 2024-02-02 DIAGNOSIS — G4733 Obstructive sleep apnea (adult) (pediatric): Secondary | ICD-10-CM | POA: Diagnosis not present

## 2024-02-02 NOTE — Telephone Encounter (Signed)
 Call patient  Sleep study result  Date of study: 01/29/2024  Impression: Suboptimal titration, titration was limited by insomnia Sleep efficiency was only 35% There was persistence of events with slight decrease at higher pressures  Recommendation:  Further evaluation and management of insomnia  Auto BiPAP with settings of minimum EPAP of 6, maximum IPAP of 20 with a pressure support of 4 with heated humidification with patient's mask of choice Patient used extra small size ResMed AirFit F10 fullface mask  Attempt at retitration will not be beneficial unless insomnia can be adequately addressed  Scheduled for follow-up to further discuss findings on sleep study

## 2024-02-02 NOTE — Procedures (Signed)
 [facility] [address] [place], [place] [address] Tel: [phone]   Fax: [phone]  Titration Interpretation  Patient Name:  Caitlin [name], Caitlin [name] Study Date:  01/29/2024 Referring Physician:  ALMARIE FERRARI [id number])   Indications for Polysomnography The patient is a 79 year old Female who is 4' 8 and weighs 94.0 lbs. Her BMI equals 21.1.  A full night titration treatment study was performed.  Medications Taken:  NO MEDICATIONS TAKEN.   Polysomnogram Data A full night polysomnogram recorded the standard physiologic parameters including EEG, EOG, EMG, EKG, nasal and oral airflow.  Respiratory parameters of chest and abdominal movements were recorded with Respiratory Inductance Plethysmography belts.  Oxygen saturation was recorded by pulse oximetry.   Sleep Architecture The total recording time of the polysomnogram was 389.4 minutes.  The total sleep time was 138.0 minutes.  The patient spent 6.2% of total sleep time in Stage N1, 73.2% in Stage N2, 19.9% in Stages N3, and 0.7% in REM.  Sleep latency was 75.9 minutes.  REM latency was 288.5 minutes.  Sleep Efficiency was 35.4%.  Wake after Sleep Onset time was 175.5 minutes.  Titration Summary The patient was titrated at pressures ranging from 8/4/0* cm/H20 with supplemental oxygen at - up to 20/15/0* cm/H20 with supplemental oxygen at -.  The last pressure used in the study was 20/15/0* cm/H20 with supplemental oxygen at -.  Respiratory Events The polysomnogram revealed a presence of 102 obstructive, 4 central, and 11 mixed apneas resulting in an Apnea index of 50.9 events per hour.  There were 7 hypopneas (>=3% desaturation and/or arousal) resulting in an Apnea\Hypopnea Index (AHI >=3% desaturation and/or arousal) of 53.9 events per hour.  There were 5 hypopneas (>=4% desaturation) resulting in an Apnea\Hypopnea Index (AHI >=4% desaturation) of 53.0 events per hour.  There were 31 Respiratory Effort  Related Arousals resulting in a RERA index of 13.5 events per hour. The Respiratory Disturbance Index is 67.4 events per hour.  The snore index was 150.4 events per hour.  Mean oxygen saturation was 93.6%.  The lowest oxygen saturation during sleep was 83.0%.  Time spent <=88% oxygen saturation was 6.3 minutes (1.7%).  Limb Activity There were 6 limb movements recorded.  Of this total, 6 were classified as PLMs.  Of the PLMs, - were associated with arousals.  The Limb Movement index was 2.6 per hour while the PLM index was 2.6 per hour.  Cardiac Summary The average pulse rate was 72.4 bpm.  The minimum pulse rate was 64.0 bpm while the maximum pulse rate was 88.0 bpm.  Cardiac rhythm was normal/abnormal.  Comments: Patient had a titration study performed Study was limited by insomnia with sleep onset and sleep maintenance insomnia, efficiency of 35%.  There was persistence of events but with reduction at higher BiPAP pressures  Diagnosis:  Severe obstructive sleep apnea, suboptimal titration with a sleep efficiency of only 35%. Sleep onset and sleep maintenance insomnia Increased number of arousals related to respiratory events Very minimal REM sleep achieved of less than 1 minute No significant periodic limb movement Cardiac rhythm was sinus  Recommendations: Auto BiPAP with settings of minimum EPAP of 6, maximum IPAP of 20 with pressure support of 4 with heated humidification with patient's mask of choice. Patient used's extra small sized ResMed AirFit F10 fullface mask Avoid alcohol, sedatives and other CNS depressants that may worsen sleep apnea and disrupt normal sleep architecture. Sleep hygiene should be reviewed to assess factors that may improve sleep quality. Weight  management and regular exercise should be initiated or continued  Further evaluation and management of insomnia Attempt at retitration will not be beneficial unless insomnia can be adequately addressed  Close  clinical follow-up and compliance monitoring for optimization of treatment  This study was personally reviewed and electronically signed by: NEDA JENNET LABOR, MD Accredited Board Certified in Sleep Medicine Date/Time:  02/02/24     Titration Report  Patient Name: Caitlin [name] Study Date: 01/29/2024  Date of Birth: 1945/08/26 Study Type: Bilevel Titration  Age: 79 year MRN #: [id number]  Sex: Female Interpreting Physician: NEDA JENNET, [id number]  Height: 4' 8 Referring Physician: ALMARIE FERRARI [phone])  Weight: 94.0 lbs Recording Tech: Charlie George RPSGT  BMI: 21.1 Scoring Tech: Charlie George RPSGT  ESS: - Neck Size: 12.25  Mask Type RESMED AIRFIT F10 FULL-FACE Final Pressure: 20/15 CM H2O  Mask Size: X-SMALL Supplemental O2: 0   Study Overview  Lights Off: 10:34:50 PM  Count Index  Lights On: 05:04:17 AM Awakenings: 17 7.4  Time in Bed: 389.4 min. Arousals: 140 60.9  Total Sleep Time: 138.0 min. AHI (>=3% Desat and/or Ar.): 124 53.9   Sleep Efficiency: 35.4% AHI (>=4% Desat): 122 53.0   Sleep Latency: 75.9 min. Limb Movements: 6 2.6  Wake After Sleep Onset: 175.5 min. Snore: 346 150.4  REM Latency from Sleep Onset: 288.5 min. Desaturations: 111 48.3     Minimum SpO2 TST: 83.0%    Sleep Architecture  % of Time in Bed Stages Time (mins) % Sleep Time  Wake 251.5   Stage N1 8.5 6.2%  Stage N2 101.0 73.2%  Stage N3 27.5 19.9%  REM 1.0 0.7%   Arousal Summary   NREM REM Sleep Index  Respiratory Arousals 132 1 133 57.8  PLM Arousals - - - -  Isolated Limb Movement Arousals - - - -  Snore Arousals 2 - 2 0.9  Spontaneous Arousals 5 - 5 2.2  Total 139 1 140 60.9   Limb Movement Summary   Count Index  Isolated Limb Movements - -  Periodic Limb Movements (PLMs) 6 2.6  Total Limb Movements 6 2.6    Respiratory Summary   By Sleep Stage By Body Position Total   NREM REM Supine Non-Supine   Time (min) 137.0 1.0 138.0 - 138.0         Obstructive Apnea 102  - 102 - 102  Mixed Apnea 11 - 11 - 11  Central Apnea 4 - 4 - 4  Total Apneas 117 - 117 - 117  Total Apnea Index 51.2 - 50.9 - 50.9         Hypopneas (>=3% Desat and/or Ar.) 7 - 7 - 7  AHI (>=3% Desat and/or Ar.) 54.3 - 53.9 - 53.9         Hypopneas (>=4% Desat) 5 - 5 - 5  AHI (>=4% Desat) 53.4 - 53.0 - 53.0          RERAs 30 1 31  - 31  RERA Index 13.1 60.0 13.5 - 13.5         RDI 67.4 60.0 67.4 - 67.4    Respiratory Event Type Index  Central Apneas 1.7  Obstructive Apneas 44.3  Mixed Apneas 4.8  Central Hypopneas -  Obstructive Hypopneas 3.0  Central Apnea + Hypopnea (CAHI) 1.7  Obstructive Apnea + Hypopnea (OAHI) 52.2   Respiratory Event Durations   Apnea Hypopnea   NREM REM NREM REM  Average (seconds) 27.9 - 33.6 -  Maximum (seconds)  42.4 - 91.6 -    Oxygen Saturation Summary   Wake NREM REM TST TIB  Average SpO2 94.2% 92.5% 93.0% 92.5% 93.6%  Minimum SpO2 85.0% 83.0% 90.0% 83.0% 83.0%  Maximum SpO2 99.0% 97.0% 95.0% 97.0% 99.0%   Oxygen Saturation Distribution  Range (%) Time in range (min) Time in range (%)  90.0 - 100.0 347.7 91.6%  80.0 - 90.0 22.3 5.9%  70.0 - 80.0 - -  60.0 - 70.0 - -  50.0 - 60.0 - -  0.0 - 50.0 - -  Time Spent <=88% SpO2  Range (%) Time in range (min) Time in range (%)  0.0 - 88.0 6.3 1.7%      Count Index  Desaturations 111 48.3    Cardiac Summary   Wake NREM REM Sleep Total  Average Pulse Rate (BPM) 71.4 74.0 71.7 74.0 72.4  Minimum Pulse Rate (BPM) 64.0 65.0 70.0 65.0 64.0  Maximum Pulse Rate (BPM) 87.0 88.0 76.0 88.0 88.0   Pulse Rate Distribution:  Range (bpm) Time in range (min) Time in range (%)  0.0 - 40.0 - -  40.0 - 60.0 - -  60.0 - 80.0 354.4 93.3%  80.0 - 100.0 16.1 4.2%  100.0 - 120.0 - -  120.0 - 140.0 - -  140.0 - 200.0 - -   Titration Summary  PAP Device PAP Level O2 Level Time (min) Wake (min) NREM (min) REM (min) Supine TST (min) Sleep Eff% OA# CA# MA# Hyp# (>=3%) AHI (>=3%) Hyp# (>=4%) AHI  (>=%4) RERA RDI SpO2 <=88% (min) Min SpO2 Mean SpO2 Ar. Index  BiLevel 8/4/0 - 112.5 101.5 11.0 0.0  11.0 9.8% 12 - 1 4 92.7 3  87.3 3  109.1  2.1 83.0 91.0 81.8  BiLevel 9/5/0 - 30.5 16.0 14.5 0.0  14.5 47.5% 17 1 2  - 82.8 -  82.8 -  82.8  1.9 86.0 92.1 78.6  BiLevel 11/7/0 - 20.0 0.0 20.0 0.0  20.0 100.0% 25 - 2 1 84.0 1  84.0 1  87.0  1.2 87.0 92.4 87.0  BiLevel 13/9/0 - 25.5 15.0 10.5 0.0  10.5 41.2% 14 - 1 - 85.7 -  85.7 2  97.1  0.0 89.0 92.8 97.1  BiLevel 14/10/0 - 117.0 96.5 20.5 0.0  20.5 17.5% 22 - 2 - 70.2 -  70.2 1  73.2  0.7 86.0 92.7 70.2  BiLevel 16/12/0 - 16.5 0.5 16.0 0.0  16.0 97.0% 2 - - - 7.5 -  7.5 5  26.3  0.0 91.0 92.6 15.0  BiLevel 17/13/0 - 17.0 0.0 17.0 0.0  17.0 100.0% 5 1 2  - 28.2 -  28.2 4  42.4  0.2 87.0 92.7 35.3  BiLevel 18/14/0 - 15.5 4.5 11.0 0.0  11.0 71.0% - 2 - - 10.9 -  10.9 6  43.6  0.0 90.0 92.8 43.6  BiLevel 19/14/0 - 16.0 0.5 14.5 1.0  15.5 96.9% 5 - 1 1 27.1 1  27.1 9  61.9  0.0 89.0 92.9 46.5  BiLevel 20/15/0 - 19.5 17.0 2.0 0.0  2.0 10.3% - - - 1 30.0 -  - -  30.0  0.0 90.0 92.9 60.0   Hypnograms                           Technologist Comments            The patient arrived for a bilevel titration study. The patient was placed in room 6.  The procedure was explained, and questions were answered. The patient is currently on bilevel at home at a pressure of 12/8 cm H2O. The compliance reports showed a large AHI. The patient and her husband stated that the pressure used to be higher, but due to discomfort, the pressure was lowered for the patient. The patient has a small face and brought her own mask, which was used during the titration.            Bilevel was started at a pressure of 8/4 cm H2O and was increased to a pressure of 20/15 cm H2O. The patient had very little sleep time, which caused a poor titration. The patient otherwise tolerated the pressures well. The patient used an extra-small ResMed AirFit F10 full face mask during  the titration. Due to the patient's face shape and size, a mask liner may reduce the chance of leak.           The patient slept in the supine position. Mild PLMs were observed. No bruxism, seizure, or spike wave activity was observed. Oral venting was not determined based on the type of mask used. Snoring was moderate to severe, but not audible. No ECG events were observed. Stages N1, N2, N3, and REM were recorded. The patient had one-bathroom break.

## 2024-02-02 NOTE — Procedures (Signed)
 " Indications for Polysomnography The patient is a 79 year old Female who is 4' 8 and weighs 94.0 lbs. Her BMI equals 21.1.  A full night titration treatment study was performed.  Medications Taken:NO MEDICATIONS TAKEN. Polysomnogram Data A full night polysomnogram recorded the standard physiologic parameters including EEG, EOG, EMG, EKG, nasal and oral airflow.  Respiratory parameters of chest and abdominal movements were recorded with Respiratory Inductance Plethysmography belts.   Oxygen saturation was recorded by pulse oximetry.  Sleep Architecture The total recording time of the polysomnogram was 389.4 minutes.  The total sleep time was 138.0 minutes.  The patient spent 6.2% of total sleep time in Stage N1, 73.2% in Stage N2, 19.9% in Stages N3, and 0.7% in REM.  Sleep latency was 75.9 minutes.   REM latency was 288.5 minutes.  Sleep Efficiency was 35.4%.  Wake after Sleep Onset time was 175.5 minutes.  Titration Summary The patient was titrated at pressures ranging from 8/4/0* cm/H20 with supplemental oxygen at - up to 20/15/0* cm/H20 with supplemental oxygen at -.  The last pressure used in the study was 20/15/0* cm/H20 with supplemental oxygen at -.  Respiratory Events The polysomnogram revealed a presence of 102 obstructive, 4 central, and 11 mixed apneas resulting in an Apnea index of 50.9 events per hour.  There were 7 hypopneas (GreaterEqual to3% desaturation and/or arousal) resulting in an Apnea\Hypopnea Index  (AHI GreaterEqual to3% desaturation and/or arousal) of 53.9 events per hour.  There were 5 hypopneas (GreaterEqual to4% desaturation) resulting in an Apnea\Hypopnea Index (AHI GreaterEqual to4% desaturation) of 53.0 events per hour.  There were 31  Respiratory Effort Related Arousals resulting in a RERA index of 13.5 events per hour. The Respiratory Disturbance Index is 67.4 events per hour.  The snore index was 150.4 events per hour.  Mean oxygen saturation was 93.6%.  The  lowest oxygen saturation during sleep was 83.0%.  Time spent LessEqual to88% oxygen saturation was  minutes ().  Limb Activity There were 6 limb movements recorded.  Of this total, 6 were classified as PLMs.  Of the PLMs, - were associated with arousals.  The Limb Movement index was 2.6 per hour while the PLM index was 2.6 per hour.  Cardiac Summary The average pulse rate was 72.4 bpm.  The minimum pulse rate was 64.0 bpm while the maximum pulse rate was 88.0 bpm.  Cardiac rhythm was normal/abnormal.  Comments: Patient had a titration study performed Study was limited by insomnia with sleep onset and sleep maintenance insomnia, efficiency of 35%.  There was persistence of events but with reduction at higher BiPAP pressures  Diagnosis: Severe obstructive sleep apnea, suboptimal titration with a sleep efficiency of only 35%. Sleep onset and sleep maintenance insomnia Increased number of arousals related to respiratory events Very minimal REM sleep achieved of less than 1 minute No significant periodic limb movement Cardiac rhythm was sinus  Recommendations: Auto BiPAP with settings of minimum EPAP of 6, maximum IPAP of 20 with pressure support of 4 with heated humidification with patient's mask of choice. Patient used's extra small sized ResMed AirFit F10 fullface mask Avoid alcohol, sedatives and other CNS depressants that may worsen sleep apnea and disrupt normal sleep architecture. Sleep hygiene should be reviewed to assess factors that may improve sleep quality. Weight management and regular exercise should be initiated or continued  Further evaluation and management of insomnia Attempt at retitration will not be beneficial unless insomnia can be adequately addressed  Close clinical follow-up and compliance monitoring for  optimization of treatment    This study was personally reviewed and electronically signed by: NEDA JENNET LABOR, MD Accredited Board Certified in Sleep  Medicine Date/Time: 02/02/24 "

## 2024-02-04 ENCOUNTER — Ambulatory Visit: Payer: Self-pay | Admitting: Primary Care

## 2024-02-04 NOTE — Telephone Encounter (Signed)
 I called and spoke to pt husband, Caitlin [name]. [place]. Caitlin [name] informed of Dr Neda note and verbalized understanding. Caitlin [name] stated that the pt already had a BIPAP machine and agreed to the settings. Pt is also already scheduled to see Dr Theodoro on 02-21-2024 as pt doesd not wish to see Dr Neda any further. Order placed. NFN

## 2024-02-04 NOTE — Progress Notes (Signed)
 BIPAP titration study showed severe sleep apnea with poor sleep efficacy. Sleep onset and sleep maintenance insomnia. Keep apt with Dr. Pawar on 2/5

## 2024-02-06 ENCOUNTER — Ambulatory Visit: Payer: Self-pay | Admitting: Internal Medicine

## 2024-02-06 NOTE — Progress Notes (Signed)
 Bone density is quite low   would strongly  consider evaluation for bone building   therapy some of these meds are injectables . Can we refer to osteoporosis clinic or endocrinologist .

## 2024-02-21 ENCOUNTER — Ambulatory Visit

## 2024-02-21 VITALS — BP 154/74 | HR 67 | Ht <= 58 in | Wt 96.2 lb

## 2024-02-21 DIAGNOSIS — I10 Essential (primary) hypertension: Secondary | ICD-10-CM

## 2024-02-21 DIAGNOSIS — G4733 Obstructive sleep apnea (adult) (pediatric): Secondary | ICD-10-CM

## 2024-02-21 NOTE — Patient Instructions (Signed)
" °  VISIT SUMMARY:  During your visit, we discussed your ongoing issues with severe sleep apnea and insomnia. We reviewed your current treatment with the BiPAP machine and the challenges you are facing with mask fit and effectiveness. We also discussed your sleep patterns, energy levels, and the impact of your IBS on your sleep.  YOUR PLAN:  -OBSTRUCTIVE SLEEP APNEA: Obstructive sleep apnea is a condition where your airway becomes blocked during sleep, causing breathing interruptions. We adjusted your BiPAP settings to mimic CPAP settings and will conduct trial and error adjustments to find effective settings. Please monitor your apnea-hypopnea index (AHI) using the machine app and try to sleep on your side if comfortable. We will follow up in three months to assess your progress. let us  know your AHI number in 1 month via telephone call.   -INSOMNIA: Insomnia is difficulty falling or staying asleep. It may be worsened by your sleep apnea and IBS. Continue taking melatonin 1 mg before bed and try to sleep on your side if comfortable.  INSTRUCTIONS:  Please monitor your AHI using the machine app and try to sleep on your side if comfortable. We will follow up in three months to assess your progress.    Contains text generated by Abridge.   "

## 2024-02-21 NOTE — Progress Notes (Signed)
 "  Pulmonology Office Visit   Subjective:  Patient ID: Caitlin [name], female    DOB: 12/14/1945  MRN: [id number]  Referred by: Charlett Apolinar POUR, MD  CC:  Chief Complaint  Patient presents with   Follow-up    F/U on Sleep study    HPI Caitlin [name] is a 79 y.o. female non-smoker with hypertension, allergic rhinitis, GERD, fibromyalgia, GAD, mild cognitive impairment and OSA presents as follow-up for OSA.  Vertell Ferrari 11/20/23> poor control of central and obstructive apneas with lower pressure.  BiPAP titration study ordered.  In addition identified cardiac murmur and echo was ordered.  Respective notes from provider reviewed as appropriate to gather relevant information for patient care.   Discussed the use of AI scribe software for clinical note transcription with the patient, who gave verbal consent to proceed.  History of Present Illness   Caitlin [name] is a 79 year old female with severe sleep apnea who presents for a follow-up regarding her difficult-to-control sleep apnea. She is accompanied by her husband, Mitchell.  Severe sleep apnea has been a significant issue since 2019. She has been using a BiPAP machine with a full face mask, but it has not been effective in managing her apnea. She describes the BiPAP as 'as good as not using it.' The mask fit has been challenging due to her small face size, and despite obtaining a woman's extra small size, she continues to experience issues with mask leakage and noise, which disrupts her sleep. She experiences 27 apnea events per hour.  Her sleep pattern involves going to bed around midnight and waking up around 11 AM. She wakes up at least three to four times a night, often to use the bathroom, and sometimes has difficulty falling back asleep. She primarily sleeps on her back due to issues with the mask sliding off when she sleeps on her side. She experiences low energy levels and has been napping during the day. Her husband notes that her energy  levels are low, and she has not fully recovered from her hospital stay in 2024.  She has a history of IBS, which contributes to her frequent nighttime awakenings due to upset stomachs. She also experienced significant weight loss after a hospital stay in January 2024, where she was admitted for falls and other issues. During this time, her sleep apnea was not effectively managed, and she did not use her BiPAP for a year following the hospitalization due to 'brain fog.'  Her current medications include Wellbutrin , Lexapro , and over-the-counter melatonin (1 mg) taken before bed. She has never done well in sleep labs, unable to sleep well during studies.      OSA history: Moderate OSA diagnosed in 2019.  Multiple pressure changes done from 18/14 down to 14/10 then 12/8.  All unsuccessful.  Now having insomnia and poor apnea control.  PAP download compliance data: [employer] January-February 2026 Air curve BiPAP 12/8 Encore/Airview ResMed Hours of usage: 8-hour 24-minute. Days used >4hr: Leak: 28/30 AHI: 26.8, central 2.1.  PRIOR TESTS and IMAGING: PSG/HSAT:  HST 2019: AHI 17.8, 109-minute desaturation with O2 nadir 83. Titration study 2019: Incomplete treatment of apneas.  BiPAP 18/14 recommended.  However at lower CPAP pressures AHI was controlled. January 2025 AHI 44.2, central sleep apnea 9.4, poor sleep efficiency., O2 nadir 87, desaturation less than 0.5-minute.  Total sleep time 89.5-minute. Jan 2026 total sleep time 138-minute, respiratory arousal index of 57.8, desaturation 6.3 minutes.  No pressure settings controlled her  sleep apnea.  Echo November 2025: EF 60-65% right ventricular size normal.  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     03/15/2018    1:00 PM 02/13/2018   11:00 AM 09/25/2017    3:00 PM  Results of the Epworth flowsheet  Sitting and reading 0 0 0  Watching TV 3 3 1   Sitting,  inactive in a public place (e.g. a theatre or a meeting) 0 0 0  As a passenger in a car for an hour without a break 0 0 0  Lying down to rest in the afternoon when circumstances permit 3 3 2   Sitting and talking to someone 0 0 0  Sitting quietly after a lunch without alcohol 0 0 0  In a car, while stopped for a few minutes in traffic 0 0 0  Total score 6 6 3     Allergies: Codeine phosphate, Mobic  [meloxicam ], Protonix  [pantoprazole  sodium], Ibuprofen, Peppermint oil, Aleve [naproxen], Colestipol  hcl, Omeprazole-sodium bicarbonate, Sodium, and Sulfamethoxazole Current Medications[1] Past Medical History:  Diagnosis Date   Allergic rhinitis 07/11/2006   Back pain 10/09/2007   Blood transfusion without reported diagnosis    Diaphragmatic hernia 07/11/2006   Diverticulosis of colon    Eczema 07/11/2006   Elevated cholesterol 04/29/2012   Enteritis    Erythema nodosum    Tends to be seasonal has had a negative chest x-ray in the past negative PPD   Essential hypertension 07/11/2006   Fatigue 03/12/2007   Fibromyalgia    Gastroparesis 12/23/2008   Generalized anxiety disorder 09/01/2014   GERD (gastroesophageal reflux disease)    History of stricture and dilatation and Nissen surgery   Hearing loss, left ear 11/27/2008   History of cataract    bilateral; s/p surgery   Hyperglycemia 08/15/2006   Irritable bowel syndrome 12/23/2008   Leg cramps, nocturnal 11/11/2007   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   MCI (mild cognitive impairment) 10/22/2018   Obstructive sleep apnea 04/23/2007   uses CPAP nightly; mask has leak   Osteoarthritis 11/28/2011   Osteopenia    Prediabetes 04/19/2011   Vitamin B12 deficiency 05/13/2010   Past Surgical History:  Procedure Laterality Date   ABDOMINAL HYSTERECTOMY     bone spur     CATARACT EXTRACTION W/ INTRAOCULAR LENS  IMPLANT, BILATERAL     CESAREAN SECTION     x 1   CHOLECYSTECTOMY     COLONOSCOPY     DENTAL SURGERY      ESOPHAGOGASTRODUODENOSCOPY     stricture and dilatation   IR KYPHO EA ADDL LEVEL THORACIC OR LUMBAR  04/28/2022   IR KYPHO THORACIC WITH BONE BIOPSY  04/28/2022   IR RADIOLOGIST EVAL & MGMT  04/20/2022   IR RADIOLOGIST EVAL & MGMT  05/19/2022   left shoulder surgery  06/08, 07/12/17   x 2   MOUTH SURGERY Right 06/03/2019   NISSEN FUNDOPLICATION     ROTATOR CUFF REPAIR Left    rt knee surgery     scope   UPPER GASTROINTESTINAL ENDOSCOPY     Family History  Problem Relation Age of Onset   Depression Mother    Kidney cancer Mother    Dementia Mother        Unspecified, likely secondary to other medical condition (cancer)   Depression Father    Hiatal hernia Father    COPD Sister    Dementia Sister        Unspecified, likely secondary  to other medical condition   Dementia Brother    Heart Problems Brother    Nephrolithiasis Other    Colon cancer Neg Hx    Esophageal cancer Neg Hx    Pancreatic cancer Neg Hx    Rectal cancer Neg Hx    Stomach cancer Neg Hx    Breast cancer Neg Hx    Social History   Socioeconomic History   Marital status: Married    Spouse name: Not on file   Number of children: 1   Years of education: 14   Highest education level: Associate degree: academic program  Occupational History   Occupation: Retired  Tobacco Use   Smoking status: Never   Smokeless tobacco: Never  Vaping Use   Vaping status: Never Used  Substance and Sexual Activity   Alcohol use: No    Alcohol/week: 0.0 standard drinks of alcohol   Drug use: No   Sexual activity: Not Currently  Other Topics Concern   Not on file  Social History Narrative   Unemployed   Married   HH of 2    Has grandchildren out of state   No pets   Sis. died 2011-11-14COPD   Ranch home   Right handed   Social Drivers of Health   Tobacco Use: Low Risk (01/22/2024)   Patient History    Smoking Tobacco Use: Never    Smokeless Tobacco Use: Never    Passive Exposure: Not on file  Financial Resource  Strain: Low Risk (03/28/2023)   Overall Financial Resource Strain (CARDIA)    Difficulty of Paying Living Expenses: Not hard at all  Food Insecurity: No Food Insecurity (12/03/2023)   Epic    Worried About Programme Researcher, Broadcasting/film/video in the Last Year: Never true    Ran Out of Food in the Last Year: Never true  Transportation Needs: No Transportation Needs (12/03/2023)   Epic    Lack of Transportation (Medical): No    Lack of Transportation (Non-Medical): No  Physical Activity: Inactive (12/03/2023)   Exercise Vital Sign    Days of Exercise per Week: 0 days    Minutes of Exercise per Session: 0 min  Stress: No Stress Concern Present (12/03/2023)   [employer] of Occupational Health - Occupational Stress Questionnaire    Feeling of Stress: Not at all  Social Connections: Socially Integrated (12/03/2023)   Social Connection and Isolation Panel    Frequency of Communication with Friends and Family: More than three times a week    Frequency of Social Gatherings with Friends and Family: More than three times a week    Attends Religious Services: More than 4 times per year    Active Member of Clubs or Organizations: Yes    Attends Banker Meetings: More than 4 times per year    Marital Status: Married  Catering Manager Violence: Not At Risk (12/03/2023)   Epic    Fear of Current or Ex-Partner: No    Emotionally Abused: No    Physically Abused: No    Sexually Abused: No  Depression (PHQ2-9): Low Risk (12/03/2023)   Depression (PHQ2-9)    PHQ-2 Score: 0  Recent Concern: Depression (PHQ2-9) - High Risk (11/08/2023)   Depression (PHQ2-9)    PHQ-2 Score: 15  Alcohol Screen: Low Risk (09/28/2022)   Alcohol Screen    Last Alcohol Screening Score (AUDIT): 0  Housing: Unknown (12/03/2023)   Epic    Unable to Pay for Housing in the Last Year:  No    Number of Times Moved in the Last Year: Not on file    Homeless in the Last Year: No  Utilities: Not At Risk (12/03/2023)   Epic     Threatened with loss of utilities: No  Health Literacy: Adequate Health Literacy (12/03/2023)   B1300 Health Literacy    Frequency of need for help with medical instructions: Never       Objective:  BP (!) 154/78 (BP Location: Left Arm, Cuff Size: Normal)   Pulse 67   Ht 4' 8 (1.422 m)   Wt 96 lb 4 oz (43.7 kg)   SpO2 99% Comment: on RA  BMI 21.58 kg/m  BMI Readings from Last 3 Encounters:  02/21/24 21.58 kg/m  01/29/24 21.07 kg/m  01/22/24 21.07 kg/m    Physical Exam: Physical Exam   ENT: Normal mucosa.  PULMONARY: Lungs clear to auscultation bilaterally, no adventitious breath sounds. CARDIOVASCULAR: Regular rate and rhythm, S1 S2 normal, no murmurs. ABDOMEN: Abdomen soft, nontender. Bowel sounds are normal. EXTREMITIES: No peripheral edema.       Diagnostic Review:  Last metabolic panel Lab Results  Component Value Date   GLUCOSE 77 07/31/2023   NA 143 07/31/2023   K 3.8 07/31/2023   CL 105 07/31/2023   CO2 30 07/31/2023   BUN 20 07/31/2023   CREATININE 0.80 07/31/2023   GFR 71.07 07/31/2023   CALCIUM 9.5 07/31/2023   PROT 6.3 07/31/2023   ALBUMIN 4.3 07/31/2023   BILITOT 0.5 07/31/2023   ALKPHOS 60 07/31/2023   AST 13 07/31/2023   ALT 9 07/31/2023   ANIONGAP 12 01/27/2022         Assessment & Plan:   Assessment & Plan OSA (obstructive sleep apnea)     Primary hypertension 154/78 during appointment. Follow up with primary care for management.         Assessment and Plan    Obstructive sleep apnea Severe obstructive sleep apnea with ineffective BiPAP therapy. Current settings 12/8 result in 27 events per hour. CPAP settings may be more suitable. - Adjusted BiPAP settings to mimic CPAP settings 7 cm H2o.  - Conduct trial and error adjustments to find effective settings. - Monitor AHI using machine app. - Encouraged side sleeping if comfortable. - Scheduled follow-up in three months. - will avoid doing any more titration study in  future as she does not sleep well in sleep lab.   Insomnia Difficulty falling asleep and frequent awakenings, possibly exacerbated by sleep apnea and IBS-related nocturnal awakenings. Low energy levels possibly related to sleep apnea and insomnia. - Continue melatonin 1 mg before bed. - Encouraged side sleeping if comfortable.       Notes from Vertell Ferrari 11/20/23 reviewed as to gather relevant information for patient care and formulating plan.  She was counselled about not driving while drowsy which is common side effect of sleep related disorders.   Return in about 3 months (around 05/20/2024).   I personally spent a total of 30 minutes in the care of the patient today including preparing to see the patient, getting/reviewing separately obtained history, performing a medically appropriate exam/evaluation, counseling and educating, placing orders, documenting clinical information in the EHR, independently interpreting results, and communicating results.   Leanny Moeckel, MD     [1]  Current Outpatient Medications:    acetaminophen  (TYLENOL ) 325 MG tablet, Take 2 tablets (650 mg total) by mouth every 6 (six) hours as needed for mild pain (or Fever >/= 101)., Disp: , Rfl:  buPROPion  (WELLBUTRIN  XL) 150 MG 24 hr tablet, Take 1 tablet (150 mg total) by mouth daily., Disp: 90 tablet, Rfl: 3   Cholecalciferol  (VITAMIN D3) 1000 units CAPS, Take 1,000 Units by mouth daily., Disp: , Rfl:    Cyanocobalamin  (VITAMIN B-12) 1000 MCG SUBL, Place 2,000 mcg under the tongue. Every other day, Disp: , Rfl:    dicyclomine  (BENTYL ) 10 MG capsule, TAKE 1 CAPSULE BY MOUTH EVERY 8 HOURS AS NEEDED FOR  SPASMS, Disp: 270 capsule, Rfl: 0   Docusate Calcium (STOOL SOFTENER PO), Take by mouth. Every other day, Disp: , Rfl:    escitalopram  (LEXAPRO ) 20 MG tablet, Take 1 tablet (20 mg total) by mouth daily., Disp: 90 tablet, Rfl: 1   esomeprazole  (NEXIUM ) 40 MG capsule, Take 40 mg by mouth every morning., Disp: , Rfl:     famotidine  (PEPCID ) 40 MG tablet, Take 1 tablet by mouth once daily, Disp: 90 tablet, Rfl: 0   FIBER PO, Take by mouth daily. Benefiber powder, Disp: , Rfl:    memantine  (NAMENDA ) 10 MG tablet, Take 1 tablet (10 mg total) by mouth 2 (two) times daily., Disp: 60 tablet, Rfl: 11   Multiple Vitamins-Minerals (PRESERVISION AREDS 2) CAPS, Take 1 capsule by mouth 2 (two) times a week., Disp: , Rfl:    pantoprazole  (PROTONIX ) 40 MG tablet, Take 1 tablet by mouth once daily, Disp: 90 tablet, Rfl: 0   potassium chloride  (KLOR-CON ) 10 MEQ tablet, TAKE 1 TABLET BY MOUTH EVERY OTHER DAY, Disp: 45 tablet, Rfl: 0   simethicone (MYLICON) 80 MG chewable tablet, Chew 80 mg by mouth every 6 (six) hours as needed for flatulence., Disp: , Rfl:    SIMPLY SALINE 0.9 % AERS, Place 1 spray into both nostrils in the morning and at bedtime., Disp: , Rfl:   "

## 2024-02-27 ENCOUNTER — Other Ambulatory Visit

## 2024-04-21 ENCOUNTER — Ambulatory Visit: Admitting: Physician Assistant

## 2024-05-26 ENCOUNTER — Ambulatory Visit

## 2024-07-23 ENCOUNTER — Ambulatory Visit: Admitting: Internal Medicine

## 2024-12-08 ENCOUNTER — Ambulatory Visit
